# Patient Record
Sex: Male | Born: 1955 | Race: Black or African American | Hispanic: No | Marital: Married | Smoking: Former smoker
Health system: Southern US, Community
[De-identification: ages and names within clinical notes are randomized; demographics above are authoritative.]

## PROBLEM LIST (undated history)

## (undated) DIAGNOSIS — Z720 Tobacco use: Secondary | ICD-10-CM

## (undated) DIAGNOSIS — I2510 Atherosclerotic heart disease of native coronary artery without angina pectoris: Secondary | ICD-10-CM

## (undated) DIAGNOSIS — M169 Osteoarthritis of hip, unspecified: Secondary | ICD-10-CM

## (undated) DIAGNOSIS — I10 Essential (primary) hypertension: Secondary | ICD-10-CM

## (undated) HISTORY — PX: CARDIAC SURGERY: SHX584

## (undated) HISTORY — PX: OTHER SURGICAL HISTORY: SHX169

---

## 2000-09-15 DIAGNOSIS — M1610 Unilateral primary osteoarthritis, unspecified hip: Secondary | ICD-10-CM | POA: Insufficient documentation

## 2000-09-15 DIAGNOSIS — K219 Gastro-esophageal reflux disease without esophagitis: Secondary | ICD-10-CM | POA: Insufficient documentation

## 2008-12-06 DIAGNOSIS — I10 Essential (primary) hypertension: Secondary | ICD-10-CM | POA: Insufficient documentation

## 2012-10-02 DIAGNOSIS — Z96641 Presence of right artificial hip joint: Secondary | ICD-10-CM | POA: Insufficient documentation

## 2016-06-02 ENCOUNTER — Emergency Department
Admission: EM | Admit: 2016-06-02 | Discharge: 2016-06-02 | Disposition: A | Discharge status: Home / Self Care | Payer: 59 | Attending: Emergency Medicine | Admitting: Emergency Medicine

## 2016-06-02 ENCOUNTER — Emergency Department: Payer: 59

## 2016-06-02 ENCOUNTER — Encounter: Payer: Self-pay | Admitting: Medical Oncology

## 2016-06-02 DIAGNOSIS — W108XXA Fall (on) (from) other stairs and steps, initial encounter: Secondary | ICD-10-CM | POA: Diagnosis not present

## 2016-06-02 DIAGNOSIS — S52501A Unspecified fracture of the lower end of right radius, initial encounter for closed fracture: Secondary | ICD-10-CM | POA: Diagnosis not present

## 2016-06-02 DIAGNOSIS — I10 Essential (primary) hypertension: Secondary | ICD-10-CM | POA: Diagnosis not present

## 2016-06-02 DIAGNOSIS — Y929 Unspecified place or not applicable: Secondary | ICD-10-CM | POA: Insufficient documentation

## 2016-06-02 DIAGNOSIS — Y999 Unspecified external cause status: Secondary | ICD-10-CM | POA: Insufficient documentation

## 2016-06-02 DIAGNOSIS — Y9301 Activity, walking, marching and hiking: Secondary | ICD-10-CM | POA: Diagnosis not present

## 2016-06-02 DIAGNOSIS — S6991XA Unspecified injury of right wrist, hand and finger(s), initial encounter: Secondary | ICD-10-CM | POA: Diagnosis present

## 2016-06-02 HISTORY — DX: Essential (primary) hypertension: I10

## 2016-06-02 MED ORDER — HYDROCODONE-ACETAMINOPHEN 5-325 MG PO TABS
2.0000 | ORAL_TABLET | Freq: Once | ORAL | Status: AC
  Administered 2016-06-02: 2 via ORAL
  Filled 2016-06-02: qty 2

## 2016-06-02 MED ORDER — HYDROCODONE-ACETAMINOPHEN 5-325 MG PO TABS: 1.0000 | ORAL_TABLET | ORAL | 0 refills | Status: DC | PRN

## 2016-06-02 NOTE — ED Provider Notes (Signed)
[facility] Emergency Department Provider Note  ____________________________________________   First MD Initiated Contact with Patient 06/02/16 1012     (approximate)  I have reviewed the triage vital signs and the nursing notes.   HISTORY  Chief Complaint Wrist Pain    HPI Mark Mack is a 60 y.o. male is here complaining of right wrist pain. Patient states that he fell down some steps last evening while walking with a friend. He has had continued pain during the night with his wrist. He denies any previous wrist fractures. He denies any head injury or loss of consciousness. He has not taken any over-the-counter medication for pain last night or this morning. Patient is still able to move digits and has sensation. Currently he rates his pain as 7/10. Pain is increased with movement. Regarding his wrist minimally improves his pain.   Past Medical History:  Diagnosis Date  . Hypertension     There are no active problems to display for this patient.   No past surgical history on file.  Prior to Admission medications   Medication Sig Start Date End Date Taking? Authorizing Provider  HYDROcodone-acetaminophen (NORCO/VICODIN) 5-325 MG tablet Take 1-2 tablets by mouth every 4 (four) hours as needed for moderate pain. 06/02/16   Tommi Rumps, PA-C    Allergies Review of patient's allergies indicates no known allergies.  No family history on file.  Social History Social History  Substance Use Topics  . Smoking status: Not on file  . Smokeless tobacco: Not on file  . Alcohol use Not on file    Review of Systems Constitutional: No fever/chills Eyes: No visual changes. ENT: No trauma Cardiovascular: Denies chest pain. Respiratory: Denies shortness of breath. Gastrointestinal:  No nausea, no vomiting.  Musculoskeletal: Positive for right wrist pain. Skin: Negative for rash. Neurological: Negative for headaches, focal weakness or  numbness.  10-point ROS otherwise negative.  ____________________________________________   PHYSICAL EXAM:  VITAL SIGNS: ED Triage Vitals [06/02/16 0955]  Enc Vitals Group     BP (!) 179/90     Pulse Rate 77     Resp 16     Temp 98.2 F (36.8 C)     Temp Source Oral     SpO2 98 %     Weight 150 lb (68 kg)     Height  (1.676 m)     Head Circumference      Peak Flow      Pain Score 7     Pain Loc      Pain Edu?      Excl. in GC?     Constitutional: Alert and oriented. Well appearing and in no acute distress. Eyes: Conjunctivae are normal. PERRL. EOMI. Head: Atraumatic. Nose: No congestion/rhinnorhea. Neck: No stridor.   Cardiovascular: Normal rate, regular rhythm. Grossly normal heart sounds.  Good peripheral circulation. Respiratory: Normal respiratory effort.  No retractions. Lungs CTAB. Musculoskeletal: On examination of the right wrist there is some soft tissue swelling but no gross deformity. Range of motion is restricted secondary to patient's pain. There is moderate tenderness on palpation of the distal radius and ulna. Patient is able to move digits slowly. Motor sensory function intact. Neurologic:  Normal speech and language. No gross focal neurologic deficits are appreciated. No gait instability. Skin:  Skin is warm, dry and intact. No rash noted. No ecchymosis, abrasions, erythema was noted. Psychiatric: Mood and affect are normal. Speech and behavior are normal.  ____________________________________________   LABS (  all labs ordered are listed, but only abnormal results are displayed)  Labs Reviewed - No data to display   RADIOLOGY Per radiologist  IMPRESSION: 1. Acute, comminuted intra-articular fracture involves the distal radius.  I, Tommi Rumpshonda L Sharde Gover, personally viewed and evaluated these images (plain radiographs) as part of my medical decision making, as well as reviewing the written report by the  radiologist. ____________________________________________   PROCEDURES  Procedure(s) performed: None  Procedures  Critical Care performed: No  ____________________________________________   INITIAL IMPRESSION / ASSESSMENT AND PLAN / ED COURSE  Pertinent labs & imaging results that were available during my care of the patient were reviewed by me and considered in my medical decision making (see chart for details).    Clinical Course   Patient was placed in an OCL volar splint and given Norco 2 tablets in the emergency room for pain. Patient is given instructions to ice and elevate to reduce swelling. He is to leave the splint on until seen by the orthopedist. Patient was given information about Dr. Joice LoftsPoggi with phone number and told to make an appointment. He is also given a note to take to work stating that he would return when evaluated by the orthopedist. Patient is continue taking Norco every 4 hours as needed for pain.  ____________________________________________   FINAL CLINICAL IMPRESSION(S) / ED DIAGNOSES  Final diagnoses:  Distal radius fracture, right, closed, initial encounter      NEW MEDICATIONS STARTED DURING THIS VISIT:  Discharge Medication List as of 06/02/2016 11:45 AM    START taking these medications   Details  HYDROcodone-acetaminophen (NORCO/VICODIN) 5-325 MG tablet Take 1-2 tablets by mouth every 4 (four) hours as needed for moderate pain., Starting Sun 06/02/2016, Print         Note:  This document was prepared using Dragon voice recognition software and may include unintentional dictation errors.    Tommi Rumpshonda L Daryl Beehler, PA-C 06/02/16 1435    Phineas SemenGraydon Goodman, MD 06/02/16 [phone]

## 2016-06-02 NOTE — Discharge Instructions (Signed)
Ice and elevate as needed for swelling and decreasing pain. Call Dr. Joice LoftsPoggi office tomorrow for an appointment. Please splint on until seen by the orthopedist. Norco one or 2 tablets every 4 hours as needed for pain.

## 2016-06-02 NOTE — ED Triage Notes (Signed)
Pt reports he fell last night and injured his rt wrist,.

## 2016-06-02 NOTE — ED Notes (Signed)
States he fell last pm  Landed on right wrist  Min swelling noted  Positive pulses

## 2018-02-14 DIAGNOSIS — I25118 Atherosclerotic heart disease of native coronary artery with other forms of angina pectoris: Secondary | ICD-10-CM | POA: Insufficient documentation

## 2018-04-03 ENCOUNTER — Encounter: Payer: Self-pay | Admitting: Emergency Medicine

## 2018-04-03 ENCOUNTER — Observation Stay
Admission: EM | Admit: 2018-04-03 | Discharge: 2018-04-03 | Disposition: A | Discharge status: Home / Self Care | Payer: BLUE CROSS/BLUE SHIELD | Attending: Internal Medicine | Admitting: Internal Medicine

## 2018-04-03 ENCOUNTER — Other Ambulatory Visit: Payer: Self-pay

## 2018-04-03 ENCOUNTER — Emergency Department: Payer: BLUE CROSS/BLUE SHIELD

## 2018-04-03 DIAGNOSIS — Z951 Presence of aortocoronary bypass graft: Secondary | ICD-10-CM | POA: Insufficient documentation

## 2018-04-03 DIAGNOSIS — Z87891 Personal history of nicotine dependence: Secondary | ICD-10-CM | POA: Diagnosis not present

## 2018-04-03 DIAGNOSIS — R7989 Other specified abnormal findings of blood chemistry: Secondary | ICD-10-CM

## 2018-04-03 DIAGNOSIS — E860 Dehydration: Secondary | ICD-10-CM | POA: Insufficient documentation

## 2018-04-03 DIAGNOSIS — E871 Hypo-osmolality and hyponatremia: Secondary | ICD-10-CM | POA: Insufficient documentation

## 2018-04-03 DIAGNOSIS — I10 Essential (primary) hypertension: Secondary | ICD-10-CM | POA: Diagnosis not present

## 2018-04-03 DIAGNOSIS — Z79899 Other long term (current) drug therapy: Secondary | ICD-10-CM | POA: Diagnosis not present

## 2018-04-03 DIAGNOSIS — E8809 Other disorders of plasma-protein metabolism, not elsewhere classified: Secondary | ICD-10-CM | POA: Insufficient documentation

## 2018-04-03 DIAGNOSIS — I2510 Atherosclerotic heart disease of native coronary artery without angina pectoris: Secondary | ICD-10-CM | POA: Insufficient documentation

## 2018-04-03 DIAGNOSIS — D649 Anemia, unspecified: Secondary | ICD-10-CM | POA: Insufficient documentation

## 2018-04-03 DIAGNOSIS — Z7982 Long term (current) use of aspirin: Secondary | ICD-10-CM | POA: Insufficient documentation

## 2018-04-03 DIAGNOSIS — R748 Abnormal levels of other serum enzymes: Secondary | ICD-10-CM | POA: Diagnosis present

## 2018-04-03 DIAGNOSIS — R0789 Other chest pain: Secondary | ICD-10-CM | POA: Diagnosis not present

## 2018-04-03 DIAGNOSIS — R531 Weakness: Secondary | ICD-10-CM | POA: Diagnosis present

## 2018-04-03 DIAGNOSIS — R778 Other specified abnormalities of plasma proteins: Secondary | ICD-10-CM

## 2018-04-03 DIAGNOSIS — R079 Chest pain, unspecified: Secondary | ICD-10-CM | POA: Diagnosis present

## 2018-04-03 LAB — BASIC METABOLIC PANEL
Anion gap: 10 (ref 5–15)
BUN: 17 mg/dL (ref 6–20)
CALCIUM: 8.5 mg/dL — AB (ref 8.9–10.3)
CHLORIDE: 100 mmol/L — AB (ref 101–111)
CO2: 24 mmol/L (ref 22–32)
CREATININE: 1.05 mg/dL (ref 0.61–1.24)
GFR calc Af Amer: >60 mL/min (ref >60)
GFR calc non Af Amer: >60 mL/min (ref >60)
GLUCOSE: 107 mg/dL — AB (ref 65–99)
Potassium: 3.5 mmol/L (ref 3.5–5.1)
Sodium: 134 mmol/L — ABNORMAL LOW (ref 135–145)

## 2018-04-03 LAB — CBC
HCT: 23.6 % — ABNORMAL LOW (ref 40.0–52.0)
Hemoglobin: 7.7 g/dL — ABNORMAL LOW (ref 13.0–18.0)
MCH: 27.5 pg (ref 26.0–34.0)
MCHC: 32.6 g/dL (ref 32.0–36.0)
MCV: 84.3 fL (ref 80.0–100.0)
PLATELETS: 486 10*3/uL — AB (ref 150–440)
RBC: 2.8 MIL/uL — ABNORMAL LOW (ref 4.40–5.90)
RDW: 14.4 % (ref 11.5–14.5)
WBC: 15.6 10*3/uL — ABNORMAL HIGH (ref 3.8–10.6)

## 2018-04-03 LAB — ABO/RH: ABO/RH(D): O POS

## 2018-04-03 LAB — LIPASE, BLOOD: LIPASE: 23 U/L (ref 11–51)

## 2018-04-03 LAB — MAGNESIUM: Magnesium: 2.2 mg/dL (ref 1.7–2.4)

## 2018-04-03 LAB — TROPONIN I
TROPONIN I: 0.25 ng/mL — AB (ref <0.03)
TROPONIN I: 0.07 ng/mL — AB (ref <0.03)

## 2018-04-03 LAB — HEPATIC FUNCTION PANEL
ALBUMIN: 3.3 g/dL — AB (ref 3.5–5.0)
ALK PHOS: 118 U/L (ref 38–126)
ALT: 40 U/L (ref 17–63)
AST: 36 U/L (ref 15–41)
BILIRUBIN DIRECT: 0.2 mg/dL (ref 0.1–0.5)
BILIRUBIN TOTAL: 1.1 mg/dL (ref 0.3–1.2)
Indirect Bilirubin: 0.9 mg/dL (ref 0.3–0.9)
Total Protein: 6.5 g/dL (ref 6.5–8.1)

## 2018-04-03 LAB — CK: CK TOTAL: 208 U/L (ref 49–397)

## 2018-04-03 LAB — BRAIN NATRIURETIC PEPTIDE: B Natriuretic Peptide: 342 pg/mL — ABNORMAL HIGH (ref 0.0–100.0)

## 2018-04-03 LAB — TSH: TSH: 1.354 u[IU]/mL (ref 0.350–4.500)

## 2018-04-03 LAB — PREPARE RBC (CROSSMATCH)

## 2018-04-03 LAB — PREALBUMIN: Prealbumin: 10.4 mg/dL — ABNORMAL LOW (ref 18–38)

## 2018-04-03 LAB — PHOSPHORUS: Phosphorus: 3.7 mg/dL (ref 2.5–4.6)

## 2018-04-03 MED ORDER — HYDROCODONE-ACETAMINOPHEN 5-325 MG PO TABS: 1.0000 | ORAL_TABLET | ORAL | Status: DC | PRN

## 2018-04-03 MED ORDER — ROSUVASTATIN CALCIUM 10 MG PO TABS: 40.0000 mg | ORAL_TABLET | Freq: Every day | ORAL | Status: DC

## 2018-04-03 MED ORDER — BISACODYL 5 MG PO TBEC: 5.0000 mg | DELAYED_RELEASE_TABLET | Freq: Every day | ORAL | Status: DC | PRN

## 2018-04-03 MED ORDER — POTASSIUM CHLORIDE CRYS ER 20 MEQ PO TBCR: 40.0000 meq | EXTENDED_RELEASE_TABLET | Freq: Once | ORAL | Status: DC

## 2018-04-03 MED ORDER — ASPIRIN EC 81 MG PO TBEC
81.0000 mg | DELAYED_RELEASE_TABLET | Freq: Every day | ORAL | Status: DC
  Administered 2018-04-03: 81 mg via ORAL
  Filled 2018-04-03: qty 1

## 2018-04-03 MED ORDER — FERROUS SULFATE 325 (65 FE) MG PO TBEC: 325.0000 mg | DELAYED_RELEASE_TABLET | Freq: Every day | ORAL | 3 refills | Status: DC

## 2018-04-03 MED ORDER — ENOXAPARIN SODIUM 40 MG/0.4ML ~~LOC~~ SOLN: 40.0000 mg | SUBCUTANEOUS | Status: DC

## 2018-04-03 MED ORDER — METOPROLOL SUCCINATE ER 50 MG PO TB24
50.0000 mg | ORAL_TABLET | Freq: Every day | ORAL | Status: DC
  Administered 2018-04-03: 50 mg via ORAL
  Filled 2018-04-03: qty 1

## 2018-04-03 MED ORDER — SENNOSIDES-DOCUSATE SODIUM 8.6-50 MG PO TABS: 1.0000 | ORAL_TABLET | Freq: Every evening | ORAL | Status: DC | PRN

## 2018-04-03 MED ORDER — ONDANSETRON HCL 4 MG PO TABS: 4.0000 mg | ORAL_TABLET | Freq: Four times a day (QID) | ORAL | Status: DC | PRN

## 2018-04-03 MED ORDER — ACETAMINOPHEN 650 MG RE SUPP: 650.0000 mg | Freq: Four times a day (QID) | RECTAL | Status: DC | PRN

## 2018-04-03 MED ORDER — FUROSEMIDE 20 MG PO TABS
20.0000 mg | ORAL_TABLET | Freq: Every day | ORAL | Status: DC
  Administered 2018-04-03: 20 mg via ORAL
  Filled 2018-04-03: qty 1

## 2018-04-03 MED ORDER — ACETAMINOPHEN 325 MG PO TABS: 650.0000 mg | ORAL_TABLET | Freq: Four times a day (QID) | ORAL | Status: DC | PRN

## 2018-04-03 MED ORDER — ROSUVASTATIN CALCIUM 20 MG PO TABS
40.0000 mg | ORAL_TABLET | Freq: Every day | ORAL | 0 refills | Status: DC
Start: 2018-04-03 — End: 2018-04-03

## 2018-04-03 MED ORDER — SODIUM CHLORIDE 0.9 % IV SOLN
INTRAVENOUS | Status: AC
  Administered 2018-04-03: 10:00:00 via INTRAVENOUS

## 2018-04-03 MED ORDER — DOCUSATE SODIUM 100 MG PO CAPS: 100.0000 mg | ORAL_CAPSULE | Freq: Every day | ORAL | 0 refills | Status: DC

## 2018-04-03 MED ORDER — ROSUVASTATIN CALCIUM 20 MG PO TABS: 20.0000 mg | ORAL_TABLET | Freq: Every day | ORAL | 0 refills | Status: DC

## 2018-04-03 MED ORDER — ONDANSETRON HCL 4 MG/2ML IJ SOLN: 4.0000 mg | Freq: Four times a day (QID) | INTRAMUSCULAR | Status: DC | PRN

## 2018-04-03 MED ORDER — PANTOPRAZOLE SODIUM 40 MG PO TBEC: 40.0000 mg | DELAYED_RELEASE_TABLET | Freq: Every day | ORAL | Status: DC

## 2018-04-03 MED ORDER — IOPAMIDOL (ISOVUE-370) INJECTION 76%
125.0000 mL | Freq: Once | INTRAVENOUS | Status: AC | PRN
  Administered 2018-04-03: 125 mL via INTRAVENOUS

## 2018-04-03 MED ORDER — SODIUM CHLORIDE 0.9 % IV SOLN: 10.0000 mL/h | Freq: Once | INTRAVENOUS | Status: DC

## 2018-04-03 NOTE — Discharge Instructions (Signed)
Follow-up with primary care physician in 3 to 5 days Follow-up with cardiology Dr. Lennette BihariKohn on Monday at 2 PM Outpatient cardiac rehab in 3 to 5 days

## 2018-04-03 NOTE — ED Provider Notes (Signed)
[facility] Emergency Department Provider Note   ____________________________________________   First MD Initiated Contact with Patient 04/03/18 0315     (approximate)  I have reviewed the triage vital signs and the nursing notes.   HISTORY  Chief Complaint Medication Reaction    HPI Mark Mack is a 62 y.o. male brought to the ED from home via EMS with a chief complaint of possible medication reaction.  Patient had four-vessel bypass at [facility] on 03/25/2018.  Discharged home 3 days ago.  States every time he takes his atorvastatin, about 30 minutes later he feels generally weak and like "not enough blood is getting to my legs and body" and "does not feel right".  Reports he was taking atorvastatin at presumably the same dose prior to surgery and in the hospital.  However, for the last 3 days he has been feeling this way.  Patient and wife have isolated these feelings specifically after taking atorvastatin.  Presents this morning because patient felt some cramping to his legs and wanted evaluation of this medicine.  Since surgery, patient denies fever, chills, chest pain other than incisional pain, shortness of breath, abdominal pain, nausea, vomiting, dysuria, diarrhea.  States he is urinating and having bowel movements per usual.  States his appetite is picking back up.  He was discharged on a 10-day course of Lasix 20 mg orally.  Wife mentions patient had issues with anemia during his hospitalization and required blood transfusion.   Past Medical History:  Diagnosis Date  . Hypertension   CAD  Patient Active Problem List   Diagnosis Date Noted  . Chest pain 04/03/2018    Past Surgical History:  Procedure Laterality Date  . CARDIAC SURGERY      Prior to Admission medications   Medication Sig Start Date End Date Taking? Authorizing Provider  acetaminophen (TYLENOL) 500 MG tablet Take 1,000 mg by mouth every 6 (six) hours as needed.    Yes [provider]  aspirin EC 81 MG tablet Take 81 mg by mouth daily.    Yes [provider]  atorvastatin (LIPITOR) 80 MG tablet Take 80 mg by mouth daily at 6 PM.  03/08/18  Yes [provider]  esomeprazole (NEXIUM) 20 MG packet Take 20 mg by mouth daily before breakfast.  04/01/18 05/01/18 Yes [provider]  furosemide (LASIX) 20 MG tablet Take 20 mg by mouth daily. 03/29/18  Yes [provider]  metoprolol succinate (TOPROL XL) 50 MG 24 hr tablet Take 50 mg by mouth daily.  02/13/18 02/13/19 Yes [provider]  HYDROcodone-acetaminophen (NORCO/VICODIN) 5-325 MG tablet Take 1-2 tablets by mouth every 4 (four) hours as needed for moderate pain. Patient not taking: Reported on 04/03/2018 06/02/16   Tommi Rumps, PA-C    Allergies Patient has no known allergies.  History reviewed. No pertinent family history.  Social History Social History   Tobacco Use  . Smoking status: Not on file  Substance Use Topics  . Alcohol use: Not on file  . Drug use: Not on file    Review of Systems  Constitutional: Positive for generalized weakness and malaise.  No fever/chills Eyes: No visual changes. ENT: No sore throat. Cardiovascular: Denies chest pain. Respiratory: Denies shortness of breath. Gastrointestinal: No abdominal pain.  No nausea, no vomiting.  No diarrhea.  No constipation. Genitourinary: Negative for dysuria. Musculoskeletal: Negative for back pain. Skin: Negative for rash. Neurological: Negative for headaches, focal weakness or numbness.   ____________________________________________  PHYSICAL EXAM:  VITAL SIGNS: ED Triage Vitals [04/03/18 0258]  Enc Vitals Group     BP 120/66     Pulse Rate 71     Resp (!) 30     Temp 97.9 F (36.6 C)     Temp Source Oral     SpO2 100 %     Weight 144 lb (65.3 kg)     Height 5\' 5"  (1.651 m)     Head Circumference      Peak Flow      Pain Score 0     Pain Loc      Pain Edu?      Excl. in  GC?     Constitutional: Alert and oriented.  Tired appearing and in no acute distress. Eyes: Conjunctivae are normal. PERRL. EOMI. Head: Atraumatic. Nose: No congestion/rhinnorhea. Mouth/Throat: Mucous membranes are moist.  Oropharynx non-erythematous. Neck: No stridor.   Cardiovascular: Normal rate, regular rhythm. Grossly normal heart sounds.  Good peripheral circulation. Respiratory: Normal respiratory effort.  No retractions. Lungs with bibasilar rales.  Midline incision clean/dry/intact.  Sutures in place. Gastrointestinal: Soft and nontender. No distention. No abdominal bruits. No CVA tenderness. Musculoskeletal: No lower extremity tenderness. 1+ pitting BLE edema which patient states has improved from prior.  No joint effusions.  Right inner thigh and calf with old ecchymosis.  Bilateral femoral and distal pulses palpable.  Symmetrically warm limbs without evidence for ischemia.  Brisk, less than 5-second capillary refill bilaterally. Neurologic:  Normal speech and language. No gross focal neurologic deficits are appreciated. No gait instability. Skin:  Skin is warm, dry and intact. No rash noted. Psychiatric: Mood and affect are normal. Speech and behavior are normal.  ____________________________________________   LABS (all labs ordered are listed, but only abnormal results are displayed)  Labs Reviewed  BASIC METABOLIC PANEL - Abnormal; Notable for the following components:      Result Value   Sodium 134 (*)    Chloride 100 (*)    Glucose, Bld 107 (*)    Calcium 8.5 (*)    All other components within normal limits  CBC - Abnormal; Notable for the following components:   WBC 15.6 (*)    RBC 2.80 (*)    Hemoglobin 7.7 (*)    HCT 23.6 (*)    Platelets 486 (*)    All other components within normal limits  TROPONIN I - Abnormal; Notable for the following components:   Troponin I 0.25 (*)    All other components within normal limits  BRAIN NATRIURETIC PEPTIDE - Abnormal;  Notable for the following components:   B Natriuretic Peptide 342.0 (*)    All other components within normal limits  HEPATIC FUNCTION PANEL - Abnormal; Notable for the following components:   Albumin 3.3 (*)    All other components within normal limits  LIPASE, BLOOD  CK  URINALYSIS, COMPLETE (UACMP) WITH MICROSCOPIC  PREALBUMIN  CALCIUM, IONIZED  MAGNESIUM  PHOSPHORUS  TSH  URINALYSIS, ROUTINE W REFLEX MICROSCOPIC  TYPE AND SCREEN  PREPARE RBC (CROSSMATCH)  ABO/RH   ____________________________________________  EKG  ED ECG REPORT I, SUNG,JADE J, the attending physician, personally viewed and interpreted this ECG.   Date: 04/03/2018  EKG Time: 0255  Rate: 73  Rhythm: normal EKG, normal sinus rhythm  Axis: Normal  Intervals:none  ST&T Change: Nonspecific Unable to visualize EKG from [facility] dated 03/31/2018 but reading the report, today's EKG sounds similar ____________________________________________  RADIOLOGY  ED MD interpretation: Small left pleural effusion  and atelectasis on chest x-ray CT angiogram demonstrates no aortic dissection, patent three-vessel runoff, subcutaneous hematomas, soft tissue swelling; ultrasound demonstrates no DVT  Official radiology report(s): Ct Angio Ao+bifem W & Or Wo Contrast  Result Date: 04/03/2018 CLINICAL DATA:  Acute onset of recurrent lower extremity discomfort after taking atorvastatin. Bilateral lower extremity nonpitting edema. Right femoral swelling and pain. EXAM: CT ANGIOGRAPHY OF ABDOMINAL AORTA WITH ILIOFEMORAL RUNOFF TECHNIQUE: Multidetector CT imaging of the abdomen, pelvis and lower extremities was performed using the standard protocol during bolus administration of intravenous contrast. Multiplanar CT image reconstructions and MIPs were obtained to evaluate the vascular anatomy. CONTRAST:  125mL ISOVUE-370 IOPAMIDOL (ISOVUE-370) INJECTION 76% COMPARISON:  None. FINDINGS: VASCULAR Aorta: There is no evidence of aortic dissection.  There is no evidence of aneurysmal dilatation. Scattered calcification is seen along the abdominal aorta and its branches. Celiac: There is mild narrowing at the origin of the celiac trunk. Mild associated calcification is seen. SMA: The superior mesenteric artery remains patent. Renals: There is mild narrowing at the larger of the patient's proximal right renal arteries. The patient has 2 right-sided renal arteries. The left renal artery is grossly unremarkable. IMA: There is marked narrowing at the origin of the inferior mesenteric artery. RIGHT Lower Extremity Inflow: There is severe luminal narrowing at the origin of the right internal iliac artery, and additional scattered calcification and mural thrombus along the right internal iliac artery. The right common and external iliac arteries demonstrate scattered calcification but are otherwise unremarkable. The right common femoral artery demonstrate scattered calcification. Outflow: There is slight narrowing at the proximal superficial femoral artery. The profunda femoris artery and its branches appear grossly patent. Mild scattered calcification is seen at the distal superficial femoral artery and popliteal artery. Runoff: There is patent 3 vessel runoff to the level of the right ankle, though atherosclerotic disease is suggested along the anterior tibial artery. LEFT Lower Extremity Inflow: There is severe luminal narrowing at the origin of the left internal iliac artery, and additional scattered calcification along the proximal left internal iliac artery. The left common and external iliac arteries demonstrate scattered calcification but are otherwise unremarkable. The left common femoral artery demonstrates mild calcification. Outflow: There is slight luminal narrowing at the proximal left superficial femoral artery. The left profunda femoris artery and its branches are grossly unremarkable in appearance. Mild calcification is noted at the distal superficial  femoral artery and popliteal artery. Runoff: There is patent 3 vessel runoff to the level of the left ankle, though atherosclerotic disease is noted along the left anterior tibial artery. Veins: Visualized venous structures are grossly unremarkable. Review of the MIP images confirms the above findings. NON-VASCULAR Lower chest: A trace left pleural effusion is noted. Bibasilar airspace opacities likely reflect atelectasis. Trace pericardial fluid is noted. The patient is status post median sternotomy. Hepatobiliary: The liver is unremarkable in appearance. The gallbladder is unremarkable in appearance. The common bile duct remains normal in caliber. Pancreas: The pancreas is within normal limits. Spleen: The spleen is unremarkable in appearance. Adrenals/Urinary Tract: The adrenal glands are unremarkable in appearance. The kidneys are within normal limits. There is no evidence of hydronephrosis. No renal or ureteral stones are identified. No perinephric stranding is seen. Stomach/Bowel: The stomach is unremarkable in appearance. The small bowel is within normal limits. The appendix is not visualized; there is no evidence for appendicitis. The colon is unremarkable in appearance. Lymphatic: No retroperitoneal or pelvic sidewall lymphadenopathy is seen. Reproductive: The bladder is mildly distended and  grossly unremarkable. The prostate is normal in size. Other: A moderate to large right-sided hydrocele is noted, with a small right-sided scrotal pearl. Musculoskeletal: The patient's right hip arthroplasty is grossly unremarkable. There is severe degenerative change at the left hip joint, with prominent subcortical cysts and joint space loss. Sclerotic change is noted at L4. The visualized musculature is unremarkable in appearance. Diffuse calcification is seen along the abdominal aorta and its branches. The abdominal aorta is otherwise grossly unremarkable. The inferior vena cava is grossly unremarkable. No  retroperitoneal lymphadenopathy is seen. No pelvic sidewall lymphadenopathy is identified. Soft tissue edema is noted at the medial aspect of the right knee and along the medial right thigh, extending through the right inguinal region. Mildly prominent right inguinal nodes measure up to 1.2 cm in short axis. A 3.1 cm subcutaneous hematoma is noted medial to the right distal femur, while a 2.9 cm subcutaneous hematoma is noted along the medial aspect of the right thigh. Additional soft tissue edema is noted overlying the medial quadriceps musculature. IMPRESSION: VASCULAR 1. No evidence of aortic dissection. No evidence of aneurysmal dilatation. 2. Patent three-vessel runoff to the level of both ankles, though atherosclerotic disease is noted along the anterior tibial arteries bilaterally. 3. Scattered calcific atherosclerotic disease noted along the arterial vasculature to both lower extremities, as described above. 4. Mild narrowing at the origin of the celiac trunk and at the larger of the patient's right renal arteries. Severe luminal narrowing at the origin of the inferior mesenteric artery, and at the origins of the internal iliac arteries bilaterally. NON-VASCULAR 1. Soft tissue edema at the medial aspect of the right knee and along the medial right thigh, extending through the right inguinal region. Subcutaneous hematomas noted medial to the distal right femur and along the medial aspect of the right thigh, measuring 3.1 cm and 2.9 cm. Diffuse soft tissue edema overlying the medial quadriceps musculature. 2. Moderate to large right-sided hydrocele, with a small right-sided scrotal pearl. 3. Trace left pleural effusion. Bibasilar airspace opacities likely reflect atelectasis. 4. Severe degenerative change at the left hip joint, with prominent subcortical cysts and joint space loss. Aortic Atherosclerosis (ICD10-I70.0). Electronically Signed   By: Roanna Raider M.D.   On: 04/03/2018 05:31   US Venous Img  Lower Bilateral  Result Date: 04/03/2018 CLINICAL DATA:  Acute onset of bilateral leg swelling. EXAM: BILATERAL LOWER EXTREMITY VENOUS DOPPLER ULTRASOUND TECHNIQUE: Gray-scale sonography with graded compression, as well as color Doppler and duplex ultrasound were performed to evaluate the lower extremity deep venous systems from the level of the common femoral vein and including the common femoral, femoral, profunda femoral, popliteal and calf veins including the posterior tibial, peroneal and gastrocnemius veins when visible. The superficial great saphenous vein was also interrogated. Spectral Doppler was utilized to evaluate flow at rest and with distal augmentation maneuvers in the common femoral, femoral and popliteal veins. COMPARISON:  CTA runoff performed earlier today at 4:20 a.m. FINDINGS: RIGHT LOWER EXTREMITY Common Femoral Vein: No evidence of thrombus. Normal compressibility, respiratory phasicity and response to augmentation. Saphenofemoral Junction: No evidence of thrombus. Normal compressibility and flow on color Doppler imaging. Profunda Femoral Vein: No evidence of thrombus. Normal compressibility and flow on color Doppler imaging. Femoral Vein: No evidence of thrombus. Normal compressibility, respiratory phasicity and response to augmentation. Popliteal Vein: No evidence of thrombus. Normal compressibility, respiratory phasicity and response to augmentation. Calf Veins: No evidence of thrombus. Normal compressibility and flow on color Doppler imaging. Superficial Great Saphenous  Vein: No evidence of thrombus. Normal compressibility. Venous Reflux:  None. Other Findings: The subcutaneous soft tissue hematoma noted on CTA extends intermittently along a length of nearly 21 cm, from the right mid medial thigh to the right knee. The 2.4 x 1.2 cm hypoechoic structure at the right inguinal region may reflect a small hematoma or possibly a poorly characterized lymph node. Adjacent trace hemorrhage is  noted. LEFT LOWER EXTREMITY Common Femoral Vein: No evidence of thrombus. Normal compressibility, respiratory phasicity and response to augmentation. Saphenofemoral Junction: No evidence of thrombus. Normal compressibility and flow on color Doppler imaging. Profunda Femoral Vein: No evidence of thrombus. Normal compressibility and flow on color Doppler imaging. Femoral Vein: No evidence of thrombus. Normal compressibility, respiratory phasicity and response to augmentation. Popliteal Vein: No evidence of thrombus. Normal compressibility, respiratory phasicity and response to augmentation. Calf Veins: No evidence of thrombus. Normal compressibility and flow on color Doppler imaging. Superficial Great Saphenous Vein: No evidence of thrombus. Normal compressibility. Venous Reflux:  None. Other Findings:  None. IMPRESSION: 1. No evidence of deep venous thrombosis. 2. Subcutaneous soft tissue hematoma noted along the right mid medial thigh to right knee, extending intermittently along a length of nearly 21 cm. 3. Question of small hematoma or possibly a poorly characterized lymph node at the right inguinal region. Electronically Signed   By: Roanna Raider M.D.   On: 04/03/2018 06:47   Dg Chest Portable 1 View  Result Date: 04/03/2018 CLINICAL DATA:  Shortness of breath EXAM: PORTABLE CHEST 1 VIEW COMPARISON:  None. FINDINGS: Mild cardiomegaly with findings of remote median sternotomy. There is a small left pleural effusion with consolidation or atelectasis of the left lung base. No pulmonary edema. IMPRESSION: Small left pleural effusion and retrocardiac left lung base atelectasis or consolidation. Electronically Signed   By: Deatra Robinson M.D.   On: 04/03/2018 03:30    ____________________________________________   PROCEDURES  Procedure(s) performed: None  Procedures  Critical Care performed: Yes, see critical care note(s)   CRITICAL CARE Performed by: Irean Hong   Total critical care time: 45  minutes  Critical care time was exclusive of separately billable procedures and treating other patients.  Critical care was necessary to treat or prevent imminent or life-threatening deterioration.  Critical care was time spent personally by me on the following activities: development of treatment plan with patient and/or surrogate as well as nursing, discussions with consultants, evaluation of patient's response to treatment, examination of patient, obtaining history from patient or surrogate, ordering and performing treatments and interventions, ordering and review of laboratory studies, ordering and review of radiographic studies, pulse oximetry and re-evaluation of patient's condition.  ____________________________________________   INITIAL IMPRESSION / ASSESSMENT AND PLAN / ED COURSE  As part of my medical decision making, I reviewed the following data within the electronic MEDICAL RECORD NUMBER History obtained from family, Nursing notes reviewed and incorporated, Labs reviewed, EKG interpreted, Old chart reviewed, Radiograph reviewed and Notes from prior ED visits   62 year old male recently status post CABG who presents with generalized weakness and malaise after taking atorvastatin.  Differential diagnosis includes but is not limited to ACS, infectious, metabolic etiologies, etc.  In addition to cardiac panel, will add CK and BLE Doppler ultrasounds.  Patient currently resting and voices no complaints of pain.  Denies symptoms of generalized weakness, malaise or "not getting enough blood to my legs and body" like he felt at home prior to arrival.   Clinical Course as of Apr 03 732  Fri Apr 03, 2018  0340 Low H/H noted.  I personally reviewed patient's [facility] records.  On 6/3 H/H was 7.7/24.2; looks like patient's discharge was held for right thigh hematoma.  Thigh was wrapped, wife tells me patient did not receive a blood transfusion and patient was discharged on on 6/4 H/H was 8/24.8.  Given  that H/H has dropped slightly since discharge, will obtain CT angiogram to evaluate for active extravasation/hematoma.   [JS]  [id number] Patient currently in ultrasound.  Updated wife of CTA results.  Will order 1 unit PRBCs for transfusion.  Discussed with hospitalist to evaluate patient in the emergency department for admission.   [JS]    Clinical Course User Index [JS] Irean Hong, MD     ____________________________________________   FINAL CLINICAL IMPRESSION(S) / ED DIAGNOSES  Final diagnoses:  Generalized weakness  Symptomatic anemia  Elevated troponin     ED Discharge Orders    None       Note:  This document was prepared using Dragon voice recognition software and may include unintentional dictation errors.    Irean Hong, MD 04/03/18 [phone]

## 2018-04-03 NOTE — ED Notes (Addendum)
Floor notified that the pt would be coming up in about 5 minutes.  Report already called per night shift nurse Teacher, early years/preButch RN.  Blood consent signed and on the chart.

## 2018-04-03 NOTE — ED Notes (Signed)
Called to give report at this time; informed by unit clerk that RN was "on another line" and would call back when available to take report. This RN's ASCOM # given for callback.

## 2018-04-03 NOTE — ED Notes (Signed)
Surgery preformed by (Ikonomidis, Alonna BucklerJohn Sotirios, MD)

## 2018-04-03 NOTE — Consult Note (Signed)
Grove Defina is a 62 y.o. male  [id number]  Primary Cardiologist: Adrian Blackwater Reason for Consultation: chest pain and mildly elevated troponin status post CABG  HPI: this is a 63 year old African-American male with a past medical history of coronary artery disease status post CABG at [facility] on 03/31/2018 4 vessel presented to the hospital with shortness of breath and was severely anemic and received blood transfusion and hemoglobin is still 7 without any obvious bleed. Patient had mildly elevated troponin and atypical chest pain from prior surgery which was recently and is explainable to be musculoskeletal pain.   Review of Systems: patient does have shortness of breath but no orthopnea PND or leg swelling   Past Medical History:  Diagnosis Date  . Hypertension     Medications Prior to Admission  Medication Sig Dispense Refill  . acetaminophen (TYLENOL) 500 MG tablet Take 1,000 mg by mouth every 6 (six) hours as needed.     [name] aspirin EC 81 MG tablet Take 81 mg by mouth daily.     [name] atorvastatin (LIPITOR) 80 MG tablet Take 80 mg by mouth daily at 6 PM.   11  . esomeprazole (NEXIUM) 20 MG packet Take 20 mg by mouth daily before breakfast.     . furosemide (LASIX) 20 MG tablet Take 20 mg by mouth daily.  0  . metoprolol succinate (TOPROL XL) 50 MG 24 hr tablet Take 50 mg by mouth daily.     [name] HYDROcodone-acetaminophen (NORCO/VICODIN) 5-325 MG tablet Take 1-2 tablets by mouth every 4 (four) hours as needed for moderate pain. (Patient not taking: Reported on 04/03/2018) 30 tablet 0     . aspirin EC  81 mg Oral Daily  . enoxaparin (LOVENOX) injection  40 mg Subcutaneous Q24H  . furosemide  20 mg Oral Daily  . metoprolol succinate  50 mg Oral Daily  . pantoprazole  40 mg Oral QAC breakfast  . potassium chloride  40 mEq Oral Once  . rosuvastatin  40 mg Oral q1800    Infusions: . sodium chloride    . sodium chloride      No Known Allergies  Social History   Socioeconomic History   . Marital status: Married    Spouse name: Not on file  . Number of children: Not on file  . Years of education: Not on file  . Highest education level: Not on file  Occupational History  . Not on file  Social Needs  . Financial resource strain: Not on file  . Food insecurity:    Worry: Not on file    Inability: Not on file  . Transportation needs:    Medical: Not on file    Non-medical: Not on file  Tobacco Use  . Smoking status: Former Smoker    Last attempt to quit: 03/23/2018    Years since quitting: 0.0  . Smokeless tobacco: Never Used  Substance and Sexual Activity  . Alcohol use: Not on file    Comment: occassional   . Drug use: Not on file  . Sexual activity: Not on file  Lifestyle  . Physical activity:    Days per week: Not on file    Minutes per session: Not on file  . Stress: Not on file  Relationships  . Social connections:    Talks on phone: Not on file    Gets together: Not on file    Attends religious service: Not on file    Active member of club or  organization: Not on file    Attends meetings of clubs or organizations: Not on file    Relationship status: Not on file  . Intimate partner violence:    Fear of current or ex partner: Not on file    Emotionally abused: Not on file    Physically abused: Not on file    Forced sexual activity: Not on file  Other Topics Concern  . Not on file  Social History Narrative  . Not on file    History reviewed. No pertinent family history.  PHYSICAL EXAM: Vitals:   04/03/18 0730 04/03/18 0751  BP: 113/71 122/66  Pulse: 71 77  Resp: 18   Temp:  98.3 F (36.8 C)  SpO2: 100% 99%     Intake/Output Summary (Last 24 hours) at 04/03/2018 0905 Last data filed at 04/03/2018 0856 Gross per 24 hour  Intake -  Output 500 ml  Net -500 ml    General:  Well appearing. No respiratory difficulty HEENT: normal Neck: supple. no JVD. Carotids 2+ bilat; no bruits. No lymphadenopathy or thryomegaly appreciated. Cor: PMI  nondisplaced. Regular rate & rhythm. No rubs, gallops or murmurs. Lungs: clear Abdomen: soft, nontender, nondistended. No hepatosplenomegaly. No bruits or masses. Good bowel sounds. Extremities: no cyanosis, clubbing, rash, edema Neuro: alert & oriented x 3, cranial nerves grossly intact. moves all 4 extremities w/o difficulty. Affect pleasant.  ECG: normal sinus rhythm with nonspecific ST-T changes secondary to prior CABG  Results for orders placed or performed during the hospital encounter of 04/03/18 (from the past 24 hour(s))  Basic metabolic panel     Status: Abnormal   Collection Time: 04/03/18  3:01 AM  Result Value Ref Range   Sodium 134 (L) 135 - 145 mmol/L   Potassium 3.5 3.5 - 5.1 mmol/L   Chloride 100 (L) 101 - 111 mmol/L   CO2 24 22 - 32 mmol/L   Glucose, Bld 107 (H) 65 - 99 mg/dL   BUN 17 6 - 20 mg/dL   Creatinine, Ser 1.30 0.61 - 1.24 mg/dL   Calcium 8.5 (L) 8.9 - 10.3 mg/dL   GFR calc non Af Amer >60 >60 mL/min   GFR calc Af Amer >60 >60 mL/min   Anion gap 10 5 - 15  CBC     Status: Abnormal   Collection Time: 04/03/18  3:01 AM  Result Value Ref Range   WBC 15.6 (H) 3.8 - 10.6 K/uL   RBC 2.80 (L) 4.40 - 5.90 MIL/uL   Hemoglobin 7.7 (L) 13.0 - 18.0 g/dL   HCT 86.5 (L) 78.4 - 69.6 %   MCV 84.3 80.0 - 100.0 fL   MCH 27.5 26.0 - 34.0 pg   MCHC 32.6 32.0 - 36.0 g/dL   RDW 29.5 28.4 - 13.2 %   Platelets 486 (H) 150 - 440 K/uL  Troponin I     Status: Abnormal   Collection Time: 04/03/18  3:01 AM  Result Value Ref Range   Troponin I 0.25 (HH) <0.03 ng/mL  Hepatic function panel     Status: Abnormal   Collection Time: 04/03/18  3:01 AM  Result Value Ref Range   Total Protein 6.5 6.5 - 8.1 g/dL   Albumin 3.3 (L) 3.5 - 5.0 g/dL   AST 36 15 - 41 U/L   ALT 40 17 - 63 U/L   Alkaline Phosphatase 118 38 - 126 U/L   Total Bilirubin 1.1 0.3 - 1.2 mg/dL   Bilirubin, Direct 0.2 0.1 - 0.5 mg/dL  Indirect Bilirubin 0.9 0.3 - 0.9 mg/dL  Lipase, blood     Status: None    Collection Time: 04/03/18  3:01 AM  Result Value Ref Range   Lipase 23 11 - 51 U/L  CK     Status: None   Collection Time: 04/03/18  3:01 AM  Result Value Ref Range   Total CK 208 49 - 397 U/L  ABO/Rh     Status: None   Collection Time: 04/03/18  3:01 AM  Result Value Ref Range   ABO/RH(D)      O POS Performed at [facility], [address] [place], [place] [address]   Brain natriuretic peptide     Status: Abnormal   Collection Time: 04/03/18  3:02 AM  Result Value Ref Range   B Natriuretic Peptide 342.0 (H) 0.0 - 100.0 pg/mL  Type and screen [facility]     Status: None (Preliminary result)   Collection Time: 04/03/18  4:38 AM  Result Value Ref Range   ABO/RH(D) O POS    Antibody Screen NEG    Sample Expiration 04/06/2018    Unit Number [id number]    Blood Component Type RCLI PHER 2    Unit division 00    Status of Unit ALLOCATED    Transfusion Status OK TO TRANSFUSE    Crossmatch Result      Compatible Performed at [facility], [address] [place], [place] [address]   Prepare RBC     Status: None   Collection Time: 04/03/18  5:41 AM  Result Value Ref Range   Order Confirmation      ORDER PROCESSED BY BLOOD BANK Performed at [facility], [address] [place], [place] [address]    Ct Angio Ao+bifem W & Or Wo Contrast  Result Date: 04/03/2018 CLINICAL DATA:  Acute onset of recurrent lower extremity discomfort after taking atorvastatin. Bilateral lower extremity nonpitting edema. Right femoral swelling and pain. EXAM: CT ANGIOGRAPHY OF ABDOMINAL AORTA WITH ILIOFEMORAL RUNOFF TECHNIQUE: Multidetector CT imaging of the abdomen, pelvis and lower extremities was performed using the standard protocol during bolus administration of intravenous contrast. Multiplanar CT image reconstructions and MIPs were obtained to evaluate the vascular anatomy. CONTRAST:  125mL ISOVUE-370 IOPAMIDOL (ISOVUE-370) INJECTION 76%  COMPARISON:  None. FINDINGS: VASCULAR Aorta: There is no evidence of aortic dissection. There is no evidence of aneurysmal dilatation. Scattered calcification is seen along the abdominal aorta and its branches. Celiac: There is mild narrowing at the origin of the celiac trunk. Mild associated calcification is seen. SMA: The superior mesenteric artery remains patent. Renals: There is mild narrowing at the larger of the patient's proximal right renal arteries. The patient has 2 right-sided renal arteries. The left renal artery is grossly unremarkable. IMA: There is marked narrowing at the origin of the inferior mesenteric artery. RIGHT Lower Extremity Inflow: There is severe luminal narrowing at the origin of the right internal iliac artery, and additional scattered calcification and mural thrombus along the right internal iliac artery. The right common and external iliac arteries demonstrate scattered calcification but are otherwise unremarkable. The right common femoral artery demonstrate scattered calcification. Outflow: There is slight narrowing at the proximal superficial femoral artery. The profunda femoris artery and its branches appear grossly patent. Mild scattered calcification is seen at the distal superficial femoral artery and popliteal artery. Runoff: There is patent 3 vessel runoff to the level of the right ankle, though atherosclerotic disease is suggested along the anterior tibial  artery. LEFT Lower Extremity Inflow: There is severe luminal narrowing at the origin of the left internal iliac artery, and additional scattered calcification along the proximal left internal iliac artery. The left common and external iliac arteries demonstrate scattered calcification but are otherwise unremarkable. The left common femoral artery demonstrates mild calcification. Outflow: There is slight luminal narrowing at the proximal left superficial femoral artery. The left profunda femoris artery and its branches are  grossly unremarkable in appearance. Mild calcification is noted at the distal superficial femoral artery and popliteal artery. Runoff: There is patent 3 vessel runoff to the level of the left ankle, though atherosclerotic disease is noted along the left anterior tibial artery. Veins: Visualized venous structures are grossly unremarkable. Review of the MIP images confirms the above findings. NON-VASCULAR Lower chest: A trace left pleural effusion is noted. Bibasilar airspace opacities likely reflect atelectasis. Trace pericardial fluid is noted. The patient is status post median sternotomy. Hepatobiliary: The liver is unremarkable in appearance. The gallbladder is unremarkable in appearance. The common bile duct remains normal in caliber. Pancreas: The pancreas is within normal limits. Spleen: The spleen is unremarkable in appearance. Adrenals/Urinary Tract: The adrenal glands are unremarkable in appearance. The kidneys are within normal limits. There is no evidence of hydronephrosis. No renal or ureteral stones are identified. No perinephric stranding is seen. Stomach/Bowel: The stomach is unremarkable in appearance. The small bowel is within normal limits. The appendix is not visualized; there is no evidence for appendicitis. The colon is unremarkable in appearance. Lymphatic: No retroperitoneal or pelvic sidewall lymphadenopathy is seen. Reproductive: The bladder is mildly distended and grossly unremarkable. The prostate is normal in size. Other: A moderate to large right-sided hydrocele is noted, with a small right-sided scrotal pearl. Musculoskeletal: The patient's right hip arthroplasty is grossly unremarkable. There is severe degenerative change at the left hip joint, with prominent subcortical cysts and joint space loss. Sclerotic change is noted at L4. The visualized musculature is unremarkable in appearance. Diffuse calcification is seen along the abdominal aorta and its branches. The abdominal aorta is  otherwise grossly unremarkable. The inferior vena cava is grossly unremarkable. No retroperitoneal lymphadenopathy is seen. No pelvic sidewall lymphadenopathy is identified. Soft tissue edema is noted at the medial aspect of the right knee and along the medial right thigh, extending through the right inguinal region. Mildly prominent right inguinal nodes measure up to 1.2 cm in short axis. A 3.1 cm subcutaneous hematoma is noted medial to the right distal femur, while a 2.9 cm subcutaneous hematoma is noted along the medial aspect of the right thigh. Additional soft tissue edema is noted overlying the medial quadriceps musculature. IMPRESSION: VASCULAR 1. No evidence of aortic dissection. No evidence of aneurysmal dilatation. 2. Patent three-vessel runoff to the level of both ankles, though atherosclerotic disease is noted along the anterior tibial arteries bilaterally. 3. Scattered calcific atherosclerotic disease noted along the arterial vasculature to both lower extremities, as described above. 4. Mild narrowing at the origin of the celiac trunk and at the larger of the patient's right renal arteries. Severe luminal narrowing at the origin of the inferior mesenteric artery, and at the origins of the internal iliac arteries bilaterally. NON-VASCULAR 1. Soft tissue edema at the medial aspect of the right knee and along the medial right thigh, extending through the right inguinal region. Subcutaneous hematomas noted medial to the distal right femur and along the medial aspect of the right thigh, measuring 3.1 cm and 2.9 cm. Diffuse soft tissue edema overlying  the medial quadriceps musculature. 2. Moderate to large right-sided hydrocele, with a small right-sided scrotal pearl. 3. Trace left pleural effusion. Bibasilar airspace opacities likely reflect atelectasis. 4. Severe degenerative change at the left hip joint, with prominent subcortical cysts and joint space loss. Aortic Atherosclerosis (ICD10-I70.0).  Electronically Signed   By: Roanna Raider M.D.   On: 04/03/2018 05:31   US Venous Img Lower Bilateral  Result Date: 04/03/2018 CLINICAL DATA:  Acute onset of bilateral leg swelling. EXAM: BILATERAL LOWER EXTREMITY VENOUS DOPPLER ULTRASOUND TECHNIQUE: Gray-scale sonography with graded compression, as well as color Doppler and duplex ultrasound were performed to evaluate the lower extremity deep venous systems from the level of the common femoral vein and including the common femoral, femoral, profunda femoral, popliteal and calf veins including the posterior tibial, peroneal and gastrocnemius veins when visible. The superficial great saphenous vein was also interrogated. Spectral Doppler was utilized to evaluate flow at rest and with distal augmentation maneuvers in the common femoral, femoral and popliteal veins. COMPARISON:  CTA runoff performed earlier today at 4:20 a.m. FINDINGS: RIGHT LOWER EXTREMITY Common Femoral Vein: No evidence of thrombus. Normal compressibility, respiratory phasicity and response to augmentation. Saphenofemoral Junction: No evidence of thrombus. Normal compressibility and flow on color Doppler imaging. Profunda Femoral Vein: No evidence of thrombus. Normal compressibility and flow on color Doppler imaging. Femoral Vein: No evidence of thrombus. Normal compressibility, respiratory phasicity and response to augmentation. Popliteal Vein: No evidence of thrombus. Normal compressibility, respiratory phasicity and response to augmentation. Calf Veins: No evidence of thrombus. Normal compressibility and flow on color Doppler imaging. Superficial Great Saphenous Vein: No evidence of thrombus. Normal compressibility. Venous Reflux:  None. Other Findings: The subcutaneous soft tissue hematoma noted on CTA extends intermittently along a length of nearly 21 cm, from the right mid medial thigh to the right knee. The 2.4 x 1.2 cm hypoechoic structure at the right inguinal region may reflect a small  hematoma or possibly a poorly characterized lymph node. Adjacent trace hemorrhage is noted. LEFT LOWER EXTREMITY Common Femoral Vein: No evidence of thrombus. Normal compressibility, respiratory phasicity and response to augmentation. Saphenofemoral Junction: No evidence of thrombus. Normal compressibility and flow on color Doppler imaging. Profunda Femoral Vein: No evidence of thrombus. Normal compressibility and flow on color Doppler imaging. Femoral Vein: No evidence of thrombus. Normal compressibility, respiratory phasicity and response to augmentation. Popliteal Vein: No evidence of thrombus. Normal compressibility, respiratory phasicity and response to augmentation. Calf Veins: No evidence of thrombus. Normal compressibility and flow on color Doppler imaging. Superficial Great Saphenous Vein: No evidence of thrombus. Normal compressibility. Venous Reflux:  None. Other Findings:  None. IMPRESSION: 1. No evidence of deep venous thrombosis. 2. Subcutaneous soft tissue hematoma noted along the right mid medial thigh to right knee, extending intermittently along a length of nearly 21 cm. 3. Question of small hematoma or possibly a poorly characterized lymph node at the right inguinal region. Electronically Signed   By: Roanna Raider M.D.   On: 04/03/2018 06:47   Dg Chest Portable 1 View  Result Date: 04/03/2018 CLINICAL DATA:  Shortness of breath EXAM: PORTABLE CHEST 1 VIEW COMPARISON:  None. FINDINGS: Mild cardiomegaly with findings of remote median sternotomy. There is a small left pleural effusion with consolidation or atelectasis of the left lung base. No pulmonary edema. IMPRESSION: Small left pleural effusion and retrocardiac left lung base atelectasis or consolidation. Electronically Signed   By: Deatra Robinson M.D.   On: 04/03/2018 03:30  ASSESSMENT AND PLAN: shortness of breath and atypical chest pain with chest pain musculoskeletal due to recent CABG which was 4 vessel at [facility] on  03/31/2018.advise transfusing another one unit of blood and then can be discharged with follow-up in the office next Monday at 2 PM. Will do outpatient workup.  Paige Monarrez A

## 2018-04-03 NOTE — ED Triage Notes (Signed)
Patient from home via [facility]. Patient [facility] a week ago and discharged Tuesday. Per patient, since surgery, every time that he takes his atorvastatin, within 30 minutes he "doesn't feel right". States he feels like "not enough blood is getting to my legs and body". Per wife, patient was on the medication before surgery but only noticed symptoms after surgery. Patient denies CP or SOB. Non-pitting edema noted to bilateral legs which wife states has been present since surgery. Patient alert and oriented with NAD noted.

## 2018-04-03 NOTE — Discharge Summary (Addendum)
[facility] [place] [facility]   PATIENT NAME: Mark Mack    MR#:  [id number]  DATE OF BIRTH:  05-05-1956  DATE OF ADMISSION:  04/03/2018 ADMITTING PHYSICIAN: Barbaraann Rondo, MD  DATE OF DISCHARGE:  04/03/18 PRIMARY CARE PHYSICIAN: Elane Fritz, MD    ADMISSION DIAGNOSIS:  Elevated troponin [R74.8] Generalized weakness [R53.1] Symptomatic anemia [D64.9]  DISCHARGE DIAGNOSIS:  Atypical chest pain Symptomatic anemia status post blood transfusion   SECONDARY DIAGNOSIS:   Past Medical History:  Diagnosis Date  . Hypertension     HOSPITAL COURSE:   [facility] Pricilla Loveless  is a 62 y.o. male who presents with fall at home and subsequent pelvic fracture.  Once here in the ED she was also found to have significantly decreased renal function.  Patient has chronic kidney disease, but her renal function has declined significantly even from her most recent baseline.  Hospitalist were called for admission and further work-up   Hospital course  Symptomatic anemia, patient is status post coronary artery disease status post CABG 1 week ago Current hemoglobin at 7.7 we will transfuse 1 unit of blood transfusion   #Elevated troponin  S/p recent CAD/MI/CABG CABG scar is healing well Continue home medications, cardiac diet Atorvastatin changed to Crestor Echocardiogram normal ejection fraction based on recent echo, Seen by Dr. Park Breed okay to discharge patient as troponins are trending down  patient prefers seeing Dr. Welton Flakes on Monday at 2 PM as he has transportation issue, could not go too far TSH nml  Hyperlipidemia atorvastatin changed to Crestor-high intensity dose 20 mg per my d/w pharmacist  DISCHARGE CONDITIONS:   stable  CONSULTS OBTAINED:  Treatment Team:  Barbaraann Rondo, MD Laurier Nancy, MD   PROCEDURES 1 unit of blood transfusion  DRUG ALLERGIES:  No Known Allergies  DISCHARGE MEDICATIONS:   Allergies as of 04/03/2018   No  Known Allergies     Medication List    STOP taking these medications   atorvastatin 80 MG tablet Commonly known as:  LIPITOR   HYDROcodone-acetaminophen 5-325 MG tablet Commonly known as:  NORCO/VICODIN     TAKE these medications   acetaminophen 500 MG tablet Commonly known as:  TYLENOL Take 1,000 mg by mouth every 6 (six) hours as needed.   aspirin EC 81 MG tablet Take 81 mg by mouth daily.   docusate sodium 100 MG capsule Commonly known as:  COLACE Take 1 capsule (100 mg total) by mouth daily.   ferrous sulfate 325 (65 FE) MG EC tablet Take 1 tablet (325 mg total) by mouth daily with breakfast.   furosemide 20 MG tablet Commonly known as:  LASIX Take 20 mg by mouth daily.   NEXIUM 20 MG packet Generic drug:  esomeprazole Take 20 mg by mouth daily before breakfast.   rosuvastatin 20 MG tablet Commonly known as:  CRESTOR Take 1 tablet (20 mg total) by mouth at bedtime.   senna-docusate 8.6-50 MG tablet Commonly known as:  Senokot-S Take 1 tablet by mouth at bedtime as needed for mild constipation.   TOPROL XL 50 MG 24 hr tablet Generic drug:  metoprolol succinate Take 50 mg by mouth daily.        DISCHARGE INSTRUCTIONS:   Follow-up with primary care physician in 3 to 5 days Follow-up with cardiology Dr. Lennette Bihari on Monday at 2 PM Outpatient cardiac rehab in 3 to 5 days  DIET:  Cardiac diet  DISCHARGE CONDITION:  Stable  ACTIVITY:  Activity as tolerated  OXYGEN:  Home Oxygen: No.   Oxygen Delivery: room air  DISCHARGE LOCATION:  home   If you experience worsening of your admission symptoms, develop shortness of breath, life threatening emergency, suicidal or homicidal thoughts you must seek medical attention immediately by calling 911 or calling your MD immediately  if symptoms less severe.  You Must read complete instructions/literature along with all the possible adverse reactions/side effects for all the Medicines you take and that have been  prescribed to you. Take any new Medicines after you have completely understood and accpet all the possible adverse reactions/side effects.   Please note  You were cared for by a hospitalist during your hospital stay. If you have any questions about your discharge medications or the care you received while you were in the hospital after you are discharged, you can call the unit and asked to speak with the hospitalist on call if the hospitalist that took care of you is not available. Once you are discharged, your primary care physician will handle any further medical issues. Please note that NO REFILLS for any discharge medications will be authorized once you are discharged, as it is imperative that you return to your primary care physician (or establish a relationship with a primary care physician if you do not have one) for your aftercare needs so that they can reassess your need for medications and monitor your lab values.     Today  Chief Complaint  Patient presents with  . Medication Reaction   Patient denies any chest pain or shortness of breath and really want to go home after blood transfusion.  Feels much better and seen by cardiology.  Okay to discharge patient from cardiology standpoint  ROS:  CONSTITUTIONAL: Denies fevers, chills. Denies any fatigue, weakness.  EYES: Denies blurry vision, double vision, eye pain. EARS, NOSE, THROAT: Denies tinnitus, ear pain, hearing loss. RESPIRATORY: Denies cough, wheeze, shortness of breath.  CARDIOVASCULAR: Denies chest pain, palpitations, edema.  GASTROINTESTINAL: Denies nausea, vomiting, diarrhea, abdominal pain. Denies bright red blood per rectum. GENITOURINARY: Denies dysuria, hematuria. ENDOCRINE: Denies nocturia or thyroid problems. HEMATOLOGIC AND LYMPHATIC: Denies easy bruising or bleeding. SKIN: Denies rash or lesion. MUSCULOSKELETAL: Denies pain in neck, back, shoulder, knees, hips or arthritic symptoms.  NEUROLOGIC: Denies  paralysis, paresthesias.  PSYCHIATRIC: Denies anxiety or depressive symptoms.   VITAL SIGNS:  Blood pressure 105/76, pulse 71, temperature 98.7 F (37.1 C), temperature source Oral, resp. rate 15, height 5\' 5"  (1.651 m), weight 65.3 kg (144 lb), SpO2 100 %.  I/O:    Intake/Output Summary (Last 24 hours) at 04/03/2018 1420 Last data filed at 04/03/2018 1234 Gross per 24 hour  Intake 400 ml  Output 500 ml  Net -100 ml    PHYSICAL EXAMINATION:  GENERAL:  62 y.o.-year-old patient lying in the bed with no acute distress.  EYES: Pupils equal, round, reactive to light and accommodation. No scleral icterus. Extraocular muscles intact.  HEENT: Head atraumatic, normocephalic. Oropharynx and nasopharynx clear.  NECK:  Supple, no jugular venous distention. No thyroid enlargement, no tenderness.  LUNGS: Normal breath sounds bilaterally, no wheezing, rales,rhonchi or crepitation. No use of accessory muscles of respiration.  CARDIOVASCULAR: S1, S2 normal. No murmurs, rubs, or gallops.  Midsternal CABG scar is healing well ABDOMEN: Soft, non-tender, non-distended. Bowel sounds present. No organomegaly or mass.  EXTREMITIES: No pedal edema, cyanosis, or clubbing.  NEUROLOGIC: Cranial nerves II through XII are intact. Muscle strength at patient's baseline in all extremities. Sensation intact. Gait not checked.  PSYCHIATRIC: The patient is alert and oriented x 3.  SKIN: No obvious rash, lesion, or ulcer.   DATA REVIEW:   CBC Recent Labs  Lab 04/03/18 0301  WBC 15.6*  HGB 7.7*  HCT 23.6*  PLT 486*    Chemistries  Recent Labs  Lab 04/03/18 0301 04/03/18 0812  NA 134*  --   K 3.5  --   CL 100*  --   CO2 24  --   GLUCOSE 107*  --   BUN 17  --   CREATININE 1.05  --   CALCIUM 8.5*  --   MG  --  2.2  AST 36  --   ALT 40  --   ALKPHOS 118  --   BILITOT 1.1  --     Cardiac Enzymes Recent Labs  Lab 04/03/18 0812  TROPONINI 0.07*    Microbiology Results  No results found for  this or any previous visit.  RADIOLOGY:  Ct Angio Ao+bifem W & Or Wo Contrast  Result Date: 04/03/2018 CLINICAL DATA:  Acute onset of recurrent lower extremity discomfort after taking atorvastatin. Bilateral lower extremity nonpitting edema. Right femoral swelling and pain. EXAM: CT ANGIOGRAPHY OF ABDOMINAL AORTA WITH ILIOFEMORAL RUNOFF TECHNIQUE: Multidetector CT imaging of the abdomen, pelvis and lower extremities was performed using the standard protocol during bolus administration of intravenous contrast. Multiplanar CT image reconstructions and MIPs were obtained to evaluate the vascular anatomy. CONTRAST:  125mL ISOVUE-370 IOPAMIDOL (ISOVUE-370) INJECTION 76% COMPARISON:  None. FINDINGS: VASCULAR Aorta: There is no evidence of aortic dissection. There is no evidence of aneurysmal dilatation. Scattered calcification is seen along the abdominal aorta and its branches. Celiac: There is mild narrowing at the origin of the celiac trunk. Mild associated calcification is seen. SMA: The superior mesenteric artery remains patent. Renals: There is mild narrowing at the larger of the patient's proximal right renal arteries. The patient has 2 right-sided renal arteries. The left renal artery is grossly unremarkable. IMA: There is marked narrowing at the origin of the inferior mesenteric artery. RIGHT Lower Extremity Inflow: There is severe luminal narrowing at the origin of the right internal iliac artery, and additional scattered calcification and mural thrombus along the right internal iliac artery. The right common and external iliac arteries demonstrate scattered calcification but are otherwise unremarkable. The right common femoral artery demonstrate scattered calcification. Outflow: There is slight narrowing at the proximal superficial femoral artery. The profunda femoris artery and its branches appear grossly patent. Mild scattered calcification is seen at the distal superficial femoral artery and popliteal  artery. Runoff: There is patent 3 vessel runoff to the level of the right ankle, though atherosclerotic disease is suggested along the anterior tibial artery. LEFT Lower Extremity Inflow: There is severe luminal narrowing at the origin of the left internal iliac artery, and additional scattered calcification along the proximal left internal iliac artery. The left common and external iliac arteries demonstrate scattered calcification but are otherwise unremarkable. The left common femoral artery demonstrates mild calcification. Outflow: There is slight luminal narrowing at the proximal left superficial femoral artery. The left profunda femoris artery and its branches are grossly unremarkable in appearance. Mild calcification is noted at the distal superficial femoral artery and popliteal artery. Runoff: There is patent 3 vessel runoff to the level of the left ankle, though atherosclerotic disease is noted along the left anterior tibial artery. Veins: Visualized venous structures are grossly unremarkable. Review of the MIP images confirms the above findings. NON-VASCULAR Lower chest: A trace  left pleural effusion is noted. Bibasilar airspace opacities likely reflect atelectasis. Trace pericardial fluid is noted. The patient is status post median sternotomy. Hepatobiliary: The liver is unremarkable in appearance. The gallbladder is unremarkable in appearance. The common bile duct remains normal in caliber. Pancreas: The pancreas is within normal limits. Spleen: The spleen is unremarkable in appearance. Adrenals/Urinary Tract: The adrenal glands are unremarkable in appearance. The kidneys are within normal limits. There is no evidence of hydronephrosis. No renal or ureteral stones are identified. No perinephric stranding is seen. Stomach/Bowel: The stomach is unremarkable in appearance. The small bowel is within normal limits. The appendix is not visualized; there is no evidence for appendicitis. The colon is  unremarkable in appearance. Lymphatic: No retroperitoneal or pelvic sidewall lymphadenopathy is seen. Reproductive: The bladder is mildly distended and grossly unremarkable. The prostate is normal in size. Other: A moderate to large right-sided hydrocele is noted, with a small right-sided scrotal pearl. Musculoskeletal: The patient's right hip arthroplasty is grossly unremarkable. There is severe degenerative change at the left hip joint, with prominent subcortical cysts and joint space loss. Sclerotic change is noted at L4. The visualized musculature is unremarkable in appearance. Diffuse calcification is seen along the abdominal aorta and its branches. The abdominal aorta is otherwise grossly unremarkable. The inferior vena cava is grossly unremarkable. No retroperitoneal lymphadenopathy is seen. No pelvic sidewall lymphadenopathy is identified. Soft tissue edema is noted at the medial aspect of the right knee and along the medial right thigh, extending through the right inguinal region. Mildly prominent right inguinal nodes measure up to 1.2 cm in short axis. A 3.1 cm subcutaneous hematoma is noted medial to the right distal femur, while a 2.9 cm subcutaneous hematoma is noted along the medial aspect of the right thigh. Additional soft tissue edema is noted overlying the medial quadriceps musculature. IMPRESSION: VASCULAR 1. No evidence of aortic dissection. No evidence of aneurysmal dilatation. 2. Patent three-vessel runoff to the level of both ankles, though atherosclerotic disease is noted along the anterior tibial arteries bilaterally. 3. Scattered calcific atherosclerotic disease noted along the arterial vasculature to both lower extremities, as described above. 4. Mild narrowing at the origin of the celiac trunk and at the larger of the patient's right renal arteries. Severe luminal narrowing at the origin of the inferior mesenteric artery, and at the origins of the internal iliac arteries bilaterally.  NON-VASCULAR 1. Soft tissue edema at the medial aspect of the right knee and along the medial right thigh, extending through the right inguinal region. Subcutaneous hematomas noted medial to the distal right femur and along the medial aspect of the right thigh, measuring 3.1 cm and 2.9 cm. Diffuse soft tissue edema overlying the medial quadriceps musculature. 2. Moderate to large right-sided hydrocele, with a small right-sided scrotal pearl. 3. Trace left pleural effusion. Bibasilar airspace opacities likely reflect atelectasis. 4. Severe degenerative change at the left hip joint, with prominent subcortical cysts and joint space loss. Aortic Atherosclerosis (ICD10-I70.0). Electronically Signed   By: Roanna Raider M.D.   On: 04/03/2018 05:31   US Venous Img Lower Bilateral  Result Date: 04/03/2018 CLINICAL DATA:  Acute onset of bilateral leg swelling. EXAM: BILATERAL LOWER EXTREMITY VENOUS DOPPLER ULTRASOUND TECHNIQUE: Gray-scale sonography with graded compression, as well as color Doppler and duplex ultrasound were performed to evaluate the lower extremity deep venous systems from the level of the common femoral vein and including the common femoral, femoral, profunda femoral, popliteal and calf veins including the posterior tibial, peroneal  and gastrocnemius veins when visible. The superficial great saphenous vein was also interrogated. Spectral Doppler was utilized to evaluate flow at rest and with distal augmentation maneuvers in the common femoral, femoral and popliteal veins. COMPARISON:  CTA runoff performed earlier today at 4:20 a.m. FINDINGS: RIGHT LOWER EXTREMITY Common Femoral Vein: No evidence of thrombus. Normal compressibility, respiratory phasicity and response to augmentation. Saphenofemoral Junction: No evidence of thrombus. Normal compressibility and flow on color Doppler imaging. Profunda Femoral Vein: No evidence of thrombus. Normal compressibility and flow on color Doppler imaging. Femoral  Vein: No evidence of thrombus. Normal compressibility, respiratory phasicity and response to augmentation. Popliteal Vein: No evidence of thrombus. Normal compressibility, respiratory phasicity and response to augmentation. Calf Veins: No evidence of thrombus. Normal compressibility and flow on color Doppler imaging. Superficial Great Saphenous Vein: No evidence of thrombus. Normal compressibility. Venous Reflux:  None. Other Findings: The subcutaneous soft tissue hematoma noted on CTA extends intermittently along a length of nearly 21 cm, from the right mid medial thigh to the right knee. The 2.4 x 1.2 cm hypoechoic structure at the right inguinal region may reflect a small hematoma or possibly a poorly characterized lymph node. Adjacent trace hemorrhage is noted. LEFT LOWER EXTREMITY Common Femoral Vein: No evidence of thrombus. Normal compressibility, respiratory phasicity and response to augmentation. Saphenofemoral Junction: No evidence of thrombus. Normal compressibility and flow on color Doppler imaging. Profunda Femoral Vein: No evidence of thrombus. Normal compressibility and flow on color Doppler imaging. Femoral Vein: No evidence of thrombus. Normal compressibility, respiratory phasicity and response to augmentation. Popliteal Vein: No evidence of thrombus. Normal compressibility, respiratory phasicity and response to augmentation. Calf Veins: No evidence of thrombus. Normal compressibility and flow on color Doppler imaging. Superficial Great Saphenous Vein: No evidence of thrombus. Normal compressibility. Venous Reflux:  None. Other Findings:  None. IMPRESSION: 1. No evidence of deep venous thrombosis. 2. Subcutaneous soft tissue hematoma noted along the right mid medial thigh to right knee, extending intermittently along a length of nearly 21 cm. 3. Question of small hematoma or possibly a poorly characterized lymph node at the right inguinal region. Electronically Signed   By: Roanna Raider M.D.   On:  04/03/2018 06:47   Dg Chest Portable 1 View  Result Date: 04/03/2018 CLINICAL DATA:  Shortness of breath EXAM: PORTABLE CHEST 1 VIEW COMPARISON:  None. FINDINGS: Mild cardiomegaly with findings of remote median sternotomy. There is a small left pleural effusion with consolidation or atelectasis of the left lung base. No pulmonary edema. IMPRESSION: Small left pleural effusion and retrocardiac left lung base atelectasis or consolidation. Electronically Signed   By: Deatra Robinson M.D.   On: 04/03/2018 03:30    EKG:   Orders placed or performed during the hospital encounter of 04/03/18  . ED EKG within 10 minutes  . ED EKG within 10 minutes  . EKG 12-Lead  . EKG 12-Lead      Management plans discussed with the patient, family and they are in agreement.  CODE STATUS:     Code Status Orders  (From admission, onward)        Start     Ordered   04/03/18 0802  Full code  Continuous     04/03/18 0801    Code Status History    This patient has a current code status but no historical code status.      TOTAL TIME TAKING CARE OF THIS PATIENT: 41  minutes.   Note: This dictation was prepared with  Dragon dictation along with smaller Lobbyist. Any transcriptional errors that result from this process are unintentional.   @[facility] @  on 04/03/2018 at 2:20 PM  Between 7am to 6pm - Pager - [phone]  After 6pm go to [email or web address] - password [email or web address] [facility]  [place] [facility]  Office  [phone]  CC: Primary care physician; Elane Fritz, MD

## 2018-04-03 NOTE — ED Notes (Signed)
Dr Dolores FrameSung made aware at this time of pt's elevated Troponin level as reported by lab= Troponin 0.25ng/mL

## 2018-04-03 NOTE — H&P (Signed)
[employer] [facility] [place] [facility]   PATIENT NAME: Mark Mack    MR#:  [id number]  DATE OF BIRTH:  02-24-1956  DATE OF ADMISSION:  04/03/2018  PRIMARY CARE PHYSICIAN: Elane Fritz, MD   REQUESTING/REFERRING PHYSICIAN: Irean Hong, MD  CHIEF COMPLAINT:   Chief Complaint  Patient presents with  . Medication Reaction    HISTORY OF PRESENT ILLNESS:  Kamen Hanken  is a 62 y.o. male with a known history of CAD/MI s/p recent CABG @ [facility] (D/Ced Tues 03/31/2018) p/w generalized weakness. Hgb at discharge (06/04) 8.0. Endorses poor PO intake to food/fluids x1wk, dehydration. Endorses intermittent palpitations. Denies CP, SOB, HA/blurred vision, AP/N/V/D, LH/LOC. States his urine is dark and smells bad, but denies urinary symptoms (burning, dysuria, hematuria, nocturia, frequency, urgency, hesitancy, incontinence, etc.). He endorses leg swelling, but has had graft vessel harvested from his RLE. Hgb 7.7. No obvious bleeding reported. Ordered for 1u pRBC txfn in ED. Unrelated, pt states taking Atorvastatin makes him feel poorly (non-specific), (-) allergy. States he feels great otherwise.  PAST MEDICAL HISTORY:   Past Medical History:  Diagnosis Date  . Hypertension     PAST SURGICAL HISTORY:   Past Surgical History:  Procedure Laterality Date  . CARDIAC SURGERY      SOCIAL HISTORY:   Social History   Tobacco Use  . Smoking status: Not on file  Substance Use Topics  . Alcohol use: Not on file    FAMILY HISTORY:  History reviewed. No pertinent family history.  DRUG ALLERGIES:  No Known Allergies  REVIEW OF SYSTEMS:   Review of Systems  Constitutional: Positive for malaise/fatigue. Negative for chills, diaphoresis, fever and weight loss.  HENT: Negative for congestion, ear pain, hearing loss, nosebleeds, sinus pain, sore throat and tinnitus.   Eyes: Negative for blurred vision, double vision and photophobia.  Respiratory: Negative for cough,  hemoptysis, sputum production, shortness of breath and wheezing.   Cardiovascular: Positive for chest pain (+) chest wall pain/TTP at sternotomy site and leg swelling (+) RLE swelling (postop). Negative for palpitations, orthopnea, claudication and PND.  Gastrointestinal: Negative for abdominal pain, blood in stool, constipation, diarrhea, heartburn, melena, nausea and vomiting.  Genitourinary: Negative for dysuria, frequency, hematuria and urgency.  Musculoskeletal: Negative for back pain, falls, joint pain, myalgias and neck pain.  Skin: Negative for itching and rash.  Neurological: Positive for weakness. Negative for dizziness, tingling, tremors, sensory change, speech change, focal weakness, seizures, loss of consciousness and headaches.  Endo/Heme/Allergies: Does not bruise/bleed easily.  Psychiatric/Behavioral: Negative for memory loss. The patient does not have insomnia.     MEDICATIONS AT HOME:   Prior to Admission medications   Medication Sig Start Date End Date Taking? Authorizing Provider  acetaminophen (TYLENOL) 500 MG tablet Take 1,000 mg by mouth every 6 (six) hours as needed.    Yes [provider]  aspirin EC 81 MG tablet Take 81 mg by mouth daily.    Yes [provider]  atorvastatin (LIPITOR) 80 MG tablet Take 80 mg by mouth daily at 6 PM.  03/08/18  Yes [provider]  esomeprazole (NEXIUM) 20 MG packet Take 20 mg by mouth daily before breakfast.  04/01/18 05/01/18 Yes [provider]  furosemide (LASIX) 20 MG tablet Take 20 mg by mouth daily. 03/29/18  Yes [provider]  metoprolol succinate (TOPROL XL) 50 MG 24 hr tablet Take 50 mg by mouth daily.  02/13/18 02/13/19 Yes [provider]  HYDROcodone-acetaminophen (  NORCO/VICODIN) 5-325 MG tablet Take 1-2 tablets by mouth every 4 (four) hours as needed for moderate pain. Patient not taking: Reported on 04/03/2018 06/02/16   Tommi Rumps, PA-C      VITAL SIGNS:  Blood  pressure 116/67, pulse 72, temperature 97.9 F (36.6 C), temperature source Oral, resp. rate 19, height 5\' 5"  (1.651 m), weight 65.3 kg (144 lb), SpO2 100 %.  PHYSICAL EXAMINATION:  Physical Exam  Constitutional: He is oriented to person, place, and time. He appears well-developed and well-nourished. No distress.  HENT:  Head: Normocephalic and atraumatic.  Mouth/Throat: Oropharynx is clear and moist. No oropharyngeal exudate.  Eyes: Conjunctivae and EOM are normal. No scleral icterus.  Neck: Neck supple. No JVD present. No thyromegaly present.  Cardiovascular: Normal rate, regular rhythm and normal heart sounds. Exam reveals no gallop and no friction rub.  No murmur heard. Pulmonary/Chest: Effort normal and breath sounds normal. No stridor. No respiratory distress. He has no wheezes. He has no rales. He exhibits tenderness.  Abdominal: Soft. Bowel sounds are normal. He exhibits no distension. There is no tenderness. There is no rebound and no guarding.  Musculoskeletal: Normal range of motion. He exhibits edema and tenderness.  Lymphadenopathy:    He has no cervical adenopathy.  Neurological: He is alert and oriented to person, place, and time.  Skin: Skin is warm and dry. No rash noted. He is not diaphoretic. No erythema.  Psychiatric: He has a normal mood and affect. His behavior is normal. Judgment and thought content normal.   (+) sternotomy (clean), (+) RLE hematoma/swelling/TTP. LABORATORY PANEL:   CBC Recent Labs  Lab 04/03/18 0301  WBC 15.6*  HGB 7.7*  HCT 23.6*  PLT 486*   ------------------------------------------------------------------------------------------------------------------  Chemistries  Recent Labs  Lab 04/03/18 0301  NA 134*  K 3.5  CL 100*  CO2 24  GLUCOSE 107*  BUN 17  CREATININE 1.05  CALCIUM 8.5*  AST 36  ALT 40  ALKPHOS 118  BILITOT 1.1    ------------------------------------------------------------------------------------------------------------------  Cardiac Enzymes Recent Labs  Lab 04/03/18 0301  TROPONINI 0.25*   ------------------------------------------------------------------------------------------------------------------  RADIOLOGY:  Ct Angio Ao+bifem W & Or Wo Contrast  Result Date: 04/03/2018 CLINICAL DATA:  Acute onset of recurrent lower extremity discomfort after taking atorvastatin. Bilateral lower extremity nonpitting edema. Right femoral swelling and pain. EXAM: CT ANGIOGRAPHY OF ABDOMINAL AORTA WITH ILIOFEMORAL RUNOFF TECHNIQUE: Multidetector CT imaging of the abdomen, pelvis and lower extremities was performed using the standard protocol during bolus administration of intravenous contrast. Multiplanar CT image reconstructions and MIPs were obtained to evaluate the vascular anatomy. CONTRAST:  ISOVUE-370 IOPAMIDOL (ISOVUE-370) INJECTION 76% COMPARISON:  None. FINDINGS: VASCULAR Aorta: There is no evidence of aortic dissection. There is no evidence of aneurysmal dilatation. Scattered calcification is seen along the abdominal aorta and its branches. Celiac: There is mild narrowing at the origin of the celiac trunk. Mild associated calcification is seen. SMA: The superior mesenteric artery remains patent. Renals: There is mild narrowing at the larger of the patient's proximal right renal arteries. The patient has 2 right-sided renal arteries. The left renal artery is grossly unremarkable. IMA: There is marked narrowing at the origin of the inferior mesenteric artery. RIGHT Lower Extremity Inflow: There is severe luminal narrowing at the origin of the right internal iliac artery, and additional scattered calcification and mural thrombus along the right internal iliac artery. The right common and external iliac arteries demonstrate scattered calcification but are otherwise unremarkable. The right common femoral  artery  demonstrate scattered calcification. Outflow: There is slight narrowing at the proximal superficial femoral artery. The profunda femoris artery and its branches appear grossly patent. Mild scattered calcification is seen at the distal superficial femoral artery and popliteal artery. Runoff: There is patent 3 vessel runoff to the level of the right ankle, though atherosclerotic disease is suggested along the anterior tibial artery. LEFT Lower Extremity Inflow: There is severe luminal narrowing at the origin of the left internal iliac artery, and additional scattered calcification along the proximal left internal iliac artery. The left common and external iliac arteries demonstrate scattered calcification but are otherwise unremarkable. The left common femoral artery demonstrates mild calcification. Outflow: There is slight luminal narrowing at the proximal left superficial femoral artery. The left profunda femoris artery and its branches are grossly unremarkable in appearance. Mild calcification is noted at the distal superficial femoral artery and popliteal artery. Runoff: There is patent 3 vessel runoff to the level of the left ankle, though atherosclerotic disease is noted along the left anterior tibial artery. Veins: Visualized venous structures are grossly unremarkable. Review of the MIP images confirms the above findings. NON-VASCULAR Lower chest: A trace left pleural effusion is noted. Bibasilar airspace opacities likely reflect atelectasis. Trace pericardial fluid is noted. The patient is status post median sternotomy. Hepatobiliary: The liver is unremarkable in appearance. The gallbladder is unremarkable in appearance. The common bile duct remains normal in caliber. Pancreas: The pancreas is within normal limits. Spleen: The spleen is unremarkable in appearance. Adrenals/Urinary Tract: The adrenal glands are unremarkable in appearance. The kidneys are within normal limits. There is no evidence of  hydronephrosis. No renal or ureteral stones are identified. No perinephric stranding is seen. Stomach/Bowel: The stomach is unremarkable in appearance. The small bowel is within normal limits. The appendix is not visualized; there is no evidence for appendicitis. The colon is unremarkable in appearance. Lymphatic: No retroperitoneal or pelvic sidewall lymphadenopathy is seen. Reproductive: The bladder is mildly distended and grossly unremarkable. The prostate is normal in size. Other: A moderate to large right-sided hydrocele is noted, with a small right-sided scrotal pearl. Musculoskeletal: The patient's right hip arthroplasty is grossly unremarkable. There is severe degenerative change at the left hip joint, with prominent subcortical cysts and joint space loss. Sclerotic change is noted at L4. The visualized musculature is unremarkable in appearance. Diffuse calcification is seen along the abdominal aorta and its branches. The abdominal aorta is otherwise grossly unremarkable. The inferior vena cava is grossly unremarkable. No retroperitoneal lymphadenopathy is seen. No pelvic sidewall lymphadenopathy is identified. Soft tissue edema is noted at the medial aspect of the right knee and along the medial right thigh, extending through the right inguinal region. Mildly prominent right inguinal nodes measure up to 1.2 cm in short axis. A 3.1 cm subcutaneous hematoma is noted medial to the right distal femur, while a 2.9 cm subcutaneous hematoma is noted along the medial aspect of the right thigh. Additional soft tissue edema is noted overlying the medial quadriceps musculature. IMPRESSION: VASCULAR 1. No evidence of aortic dissection. No evidence of aneurysmal dilatation. 2. Patent three-vessel runoff to the level of both ankles, though atherosclerotic disease is noted along the anterior tibial arteries bilaterally. 3. Scattered calcific atherosclerotic disease noted along the arterial vasculature to both lower  extremities, as described above. 4. Mild narrowing at the origin of the celiac trunk and at the larger of the patient's right renal arteries. Severe luminal narrowing at the origin of the inferior mesenteric artery, and at the  origins of the internal iliac arteries bilaterally. NON-VASCULAR 1. Soft tissue edema at the medial aspect of the right knee and along the medial right thigh, extending through the right inguinal region. Subcutaneous hematomas noted medial to the distal right femur and along the medial aspect of the right thigh, measuring 3.1 cm and 2.9 cm. Diffuse soft tissue edema overlying the medial quadriceps musculature. 2. Moderate to large right-sided hydrocele, with a small right-sided scrotal pearl. 3. Trace left pleural effusion. Bibasilar airspace opacities likely reflect atelectasis. 4. Severe degenerative change at the left hip joint, with prominent subcortical cysts and joint space loss. Aortic Atherosclerosis (ICD10-I70.0). Electronically Signed   By: Roanna RaiderJeffery  Chang M.D.   On: 04/03/2018 05:31   Dg Chest Portable 1 View  Result Date: 04/03/2018 CLINICAL DATA:  Shortness of breath EXAM: PORTABLE CHEST 1 VIEW COMPARISON:  None. FINDINGS: Mild cardiomegaly with findings of remote median sternotomy. There is a small left pleural effusion with consolidation or atelectasis of the left lung base. No pulmonary edema. IMPRESSION: Small left pleural effusion and retrocardiac left lung base atelectasis or consolidation. Electronically Signed   By: Deatra RobinsonKevin  Herman M.D.   On: 04/03/2018 03:30   IMPRESSION AND PLAN:   A/P: 38M symptomatic (normocytic) anemia, generalized weakness, mild hyponatremia, hyperglycemia, hypocalcemia, hypoalbuminemia, Troponin elevation, RLE swelling. -No acute bleeding seen on Aortobifemoral CTA -B/L LE venous U/S completed in ED, results pending -1u pRBC txfn ordered in ED, goal Hgb > 8.0 in setting of recent CAD/MI/CABG -U/A -TSH -Phosphorus level -Magnesium  level -Ionized calcium level -Prealbumin -Trop-I elevated in setting of recent surgery. Denies cardiac symptomatology. Cannot compare Trop-I values w/ [facility] discharge data, non-equivalent testing. Not trending Trop-I. Tele, continuous cardiac monitoring -Dehydrated, IVF. EF normal based on most recent Echo -Says Atorvastatin makes him feel poorly, changed to Crestor -c/w other home meds -Cardiac diet -Lovenox -Full code -Observation, < 2 midnights   All the records are reviewed and case discussed with ED provider. Management plans discussed with the patient, family and they are in agreement.  CODE STATUS: Full code  TOTAL TIME TAKING CARE OF THIS PATIENT: 90 minutes.    Barbaraann RondoPrasanna Kaliyah Gladman M.D on 04/03/2018 at 6:35 AM  Between 7am to 6pm - Pager - [phone]  After 6pm go to [email or web address] - Social research officer, governmentpassword [email or web address] [facility]  [facility]  Office  [phone]  CC: Primary care physician; Elane FritzMetzger, Gregory S, MD   Note: This dictation was prepared with Dragon dictation along with smaller phrase technology. Any transcriptional errors that result from this process are unintentional.

## 2018-04-03 NOTE — Progress Notes (Signed)
IVs and tele removed from patient. Discharge instructions given to patient along with hard cop prescription. Verbalized understanding. No acute distress at this time. Wife at bedside and will transport patient home.

## 2018-04-04 LAB — BPAM RBC
Blood Product Expiration Date: 201906122359
ISSUE DATE / TIME: 201906070939
UNIT TYPE AND RH: 9500

## 2018-04-04 LAB — TYPE AND SCREEN
ABO/RH(D): O POS
Antibody Screen: NEGATIVE
UNIT DIVISION: 0

## 2018-04-04 LAB — CALCIUM, IONIZED: CALCIUM, IONIZED, SERUM: 5 mg/dL (ref 4.5–5.6)

## 2018-04-04 LAB — HIV ANTIBODY (ROUTINE TESTING W REFLEX): HIV SCREEN 4TH GENERATION: NONREACTIVE

## 2018-05-28 ENCOUNTER — Encounter: Payer: BLUE CROSS/BLUE SHIELD | Attending: Surgery | Admitting: *Deleted

## 2018-05-28 ENCOUNTER — Encounter: Payer: Self-pay | Admitting: *Deleted

## 2018-05-28 VITALS — Ht 66.0 in | Wt 144.0 lb

## 2018-05-28 DIAGNOSIS — Z951 Presence of aortocoronary bypass graft: Secondary | ICD-10-CM | POA: Insufficient documentation

## 2018-05-28 NOTE — Progress Notes (Signed)
Daily Session Note  Patient Details  Name: Mark Mack MRN: [id number] Date of Birth: May 27, 1956 Referring Provider:     Cardiac Rehab from 05/28/2018 in [facility]  Referring Provider  Ikonimidis      Encounter Date: 05/28/2018  Check In: Session Check In - 05/28/18 1334      Check-In   Supervising physician immediately available to respond to emergencies  See telemetry face sheet for immediately available ER MD    Location  [facility]-Cardiac & Pulmonary Rehab    Staff Present  Renita Papa, RN Vickki Hearing, BA, ACSM CEP, Exercise Physiologist    Tobacco Cessation  No Change Quit 03/22/18    Warm-up and Cool-down  Not performed (comment) Med Review    Resistance Training Performed  Yes    VAD Patient?  No    PAD/SET Patient?  No      Pain Assessment   Currently in Pain?  No/denies        Exercise Prescription Changes - 05/28/18 1400      Response to Exercise   Blood Pressure (Admit)  122/64    Blood Pressure (Exercise)  160/70    Blood Pressure (Exit)  120/70    Heart Rate (Admit)  62 bpm    Heart Rate (Exercise)  130 bpm    Heart Rate (Exit)  60 bpm    Oxygen Saturation (Admit)  98 %    Oxygen Saturation (Exercise)  99 %    Rating of Perceived Exertion (Exercise)  11       Social History   Tobacco Use  Smoking Status Former Smoker  . Last attempt to quit: 03/23/2018  . Years since quitting: 0.1  Smokeless Tobacco Never Used    Goals Met:  Proper associated with RPD/PD & O2 Sat Exercise tolerated well No report of cardiac concerns or symptoms Strength training completed today  Goals Unmet:  Not Applicable  Comments: Med Review completed   Dr. Emily Filbert is Medical Director for [facility] and [facility].

## 2018-05-28 NOTE — Progress Notes (Signed)
Cardiac Individual Treatment Plan  Patient Details  Name: Mark Mack MRN: [id number] Date of Birth: 09-12-1956 Referring Provider:     Cardiac Rehab from 05/28/2018 in [facility] Cardiac and Pulmonary Rehab  Referring Provider  Ikonimidis      Initial Encounter Date:    Cardiac Rehab from 05/28/2018 in [facility] Cardiac and Pulmonary Rehab  Date  05/28/18      Visit Diagnosis: S/P CABG x 4  Patient's Home Medications on Admission:  Current Outpatient Medications:  .  acetaminophen (TYLENOL) 500 MG tablet, Take 1,000 mg by mouth every 6 (six) hours as needed. , Disp: , Rfl:  .  aspirin EC 81 MG tablet, Take 81 mg by mouth daily. , Disp: , Rfl:  .  ezetimibe (ZETIA) 10 MG tablet, Take 10 mg by mouth daily., Disp: , Rfl: 2 .  metoprolol succinate (TOPROL XL) 50 MG 24 hr tablet, Take 50 mg by mouth daily. , Disp: , Rfl:  .  ranitidine (ZANTAC) 150 MG tablet, , Disp: , Rfl:  .  docusate sodium (COLACE) 100 MG capsule, Take 1 capsule (100 mg total) by mouth daily. (Patient not taking: Reported on 05/28/2018), Disp: 10 capsule, Rfl: 0 .  esomeprazole (NEXIUM) 20 MG packet, Take 20 mg by mouth daily before breakfast. , Disp: , Rfl:  .  ferrous sulfate 325 (65 FE) MG EC tablet, Take 1 tablet (325 mg total) by mouth daily with breakfast. (Patient not taking: Reported on 05/28/2018), Disp: 60 tablet, Rfl: 3 .  furosemide (LASIX) 20 MG tablet, Take 20 mg by mouth daily., Disp: , Rfl: 0 .  rosuvastatin (CRESTOR) 20 MG tablet, Take 1 tablet (20 mg total) by mouth at bedtime. (Patient not taking: Reported on 05/28/2018), Disp: 30 tablet, Rfl: 0 .  senna-docusate (SENOKOT-S) 8.6-50 MG tablet, Take 1 tablet by mouth at bedtime as needed for mild constipation. (Patient not taking: Reported on 05/28/2018), Disp: , Rfl:   Past Medical History: Past Medical History:  Diagnosis Date  . Hypertension     Tobacco Use: Social History   Tobacco Use  Smoking Status Former Smoker  . Last attempt to quit: 03/23/2018  . Years  since quitting: 0.1  Smokeless Tobacco Never Used    Labs: Recent Review Flowsheet Data    There is no flowsheet data to display.       Exercise Target Goals: Date: 05/28/18  Exercise Program Goal: Individual exercise prescription set using results from initial 6 min walk test and THRR while considering  patient's activity barriers and safety.   Exercise Prescription Goal: Initial exercise prescription builds to 30-45 minutes a day of aerobic activity, 2-3 days per week.  Home exercise guidelines will be given to patient during program as part of exercise prescription that the participant will acknowledge.  Activity Barriers & Risk Stratification: Activity Barriers & Cardiac Risk Stratification - 05/28/18 1413      Activity Barriers & Cardiac Risk Stratification   Activity Barriers  Arthritis;Right Hip Replacement;Joint Problems Needs Left Hip Replacement    Cardiac Risk Stratification  High       6 Minute Walk: 6 Minute Walk    Row Name 05/28/18 1419         6 Minute Walk   Distance  1414 feet     Walk Time  6 minutes     # of Rest Breaks  0     MPH  2.68     METS  4.43     RPE  11  Perceived Dyspnea   0     VO2 Peak  15.5     Symptoms  No     Resting HR  67 bpm     Resting BP  122/64     Resting Oxygen Saturation   98 %     Exercise Oxygen Saturation  during 6 min walk  99 %     Max Ex. HR  130 bpm     Max Ex. BP  160/70     2 Minute Post BP  120/70        Oxygen Initial Assessment:   Oxygen Re-Evaluation:   Oxygen Discharge (Final Oxygen Re-Evaluation):   Initial Exercise Prescription: Initial Exercise Prescription - 05/28/18 1400      Date of Initial Exercise RX and Referring Provider   Date  05/28/18    Referring Provider  Ikonimidis      Treadmill   MPH  2.7    Grade  3    Minutes  15    METs  4.2      Recumbant Bike   Level  6    RPM  60    Watts  50    Minutes  15    METs  4.4      REL-XR   Level  4    Speed  50     Minutes  15    METs  4.4      Prescription Details   Frequency (times per week)  3    Duration  Progress to 45 minutes of aerobic exercise without signs/symptoms of physical distress      Intensity   THRR 40-80% of Max Heartrate  104-141    Ratings of Perceived Exertion  11-13    Perceived Dyspnea  0-4      Resistance Training   Training Prescription  Yes    Weight  3 lb    Reps  10-15       Perform Capillary Blood Glucose checks as needed.  Exercise Prescription Changes: Exercise Prescription Changes    Row Name 05/28/18 1400             Response to Exercise   Blood Pressure (Admit)  122/64       Blood Pressure (Exercise)  160/70       Blood Pressure (Exit)  120/70       Heart Rate (Admit)  62 bpm       Heart Rate (Exercise)  130 bpm       Heart Rate (Exit)  60 bpm       Oxygen Saturation (Admit)  98 %       Oxygen Saturation (Exercise)  99 %       Rating of Perceived Exertion (Exercise)  11          Exercise Comments:   Exercise Goals and Review: Exercise Goals    Row Name 05/28/18 1419             Exercise Goals   Increase Physical Activity  Yes       Intervention  Provide advice, education, support and counseling about physical activity/exercise needs.;Develop an individualized exercise prescription for aerobic and resistive training based on initial evaluation findings, risk stratification, comorbidities and participant's personal goals.       Expected Outcomes  Short Term: Attend rehab on a regular basis to increase amount of physical activity.;Long Term: Add in home exercise to make exercise part of routine and to increase  amount of physical activity.;Long Term: Exercising regularly at least 3-5 days a week.       Increase Strength and Stamina  Yes       Intervention  Provide advice, education, support and counseling about physical activity/exercise needs.;Develop an individualized exercise prescription for aerobic and resistive training based on  initial evaluation findings, risk stratification, comorbidities and participant's personal goals.       Expected Outcomes  Short Term: Increase workloads from initial exercise prescription for resistance, speed, and METs.;Short Term: Perform resistance training exercises routinely during rehab and add in resistance training at home;Long Term: Improve cardiorespiratory fitness, muscular endurance and strength as measured by increased METs and functional capacity (6MWT)       Able to understand and use rate of perceived exertion (RPE) scale  Yes       Intervention  Provide education and explanation on how to use RPE scale       Expected Outcomes  Short Term: Able to use RPE daily in rehab to express subjective intensity level;Long Term:  Able to use RPE to guide intensity level when exercising independently       Able to understand and use Dyspnea scale  Yes       Intervention  Provide education and explanation on how to use Dyspnea scale       Expected Outcomes  Short Term: Able to use Dyspnea scale daily in rehab to express subjective sense of shortness of breath during exertion;Long Term: Able to use Dyspnea scale to guide intensity level when exercising independently       Knowledge and understanding of Target Heart Rate Range (THRR)  Yes       Intervention  Provide education and explanation of THRR including how the numbers were predicted and where they are located for reference       Expected Outcomes  Short Term: Able to state/look up THRR;Short Term: Able to use daily as guideline for intensity in rehab;Long Term: Able to use THRR to govern intensity when exercising independently       Able to check pulse independently  Yes       Intervention  Provide education and demonstration on how to check pulse in carotid and radial arteries.;Review the importance of being able to check your own pulse for safety during independent exercise       Expected Outcomes  Short Term: Able to explain why pulse  checking is important during independent exercise;Long Term: Able to check pulse independently and accurately       Understanding of Exercise Prescription  Yes       Intervention  Provide education, explanation, and written materials on patient's individual exercise prescription       Expected Outcomes  Short Term: Able to explain program exercise prescription;Long Term: Able to explain home exercise prescription to exercise independently          Exercise Goals Re-Evaluation :   Discharge Exercise Prescription (Final Exercise Prescription Changes): Exercise Prescription Changes - 05/28/18 1400      Response to Exercise   Blood Pressure (Admit)  122/64    Blood Pressure (Exercise)  160/70    Blood Pressure (Exit)  120/70    Heart Rate (Admit)  62 bpm    Heart Rate (Exercise)  130 bpm    Heart Rate (Exit)  60 bpm    Oxygen Saturation (Admit)  98 %    Oxygen Saturation (Exercise)  99 %    Rating of Perceived Exertion (Exercise)  11       Nutrition:  Target Goals: Understanding of nutrition guidelines, daily intake of sodium <1544m, cholesterol <2083m calories 30% from fat and 7% or less from saturated fats, daily to have 5 or more servings of fruits and vegetables.  Biometrics: Pre Biometrics - 05/28/18 1417      Pre Biometrics   Height  _0  (1.676 m)    Weight  144 lb (65.3 kg)    Waist Circumference  30.5 inches    Hip Circumference  37.5 inches    Waist to Hip Ratio  0.81 %    BMI (Calculated)  23.25    Single Leg Stand  14.5 seconds        Nutrition Therapy Plan and Nutrition Goals: Nutrition Therapy & Goals - 05/28/18 1359      Intervention Plan   Intervention  Prescribe, educate and counsel regarding individualized specific dietary modifications aiming towards targeted core components such as weight, hypertension, lipid management, diabetes, heart failure and other comorbidities.;Nutrition handout(s) given to patient.    Expected Outcomes  Short Term Goal:  Understand basic principles of dietary content, such as calories, fat, sodium, cholesterol and nutrients.;Short Term Goal: A plan has been developed with personal nutrition goals set during dietitian appointment.;Long Term Goal: Adherence to prescribed nutrition plan.       Nutrition Assessments: Nutrition Assessments - 05/28/18 1359      MEDFICTS Scores   Pre Score  45       Nutrition Goals Re-Evaluation:   Nutrition Goals Discharge (Final Nutrition Goals Re-Evaluation):   Psychosocial: Target Goals: Acknowledge presence or absence of significant depression and/or stress, maximize coping skills, provide positive support system. Participant is able to verbalize types and ability to use techniques and skills needed for reducing stress and depression.   Initial Review & Psychosocial Screening: Initial Psych Review & Screening - 05/28/18 1359      Initial Review   Current issues with  Current Stress Concerns    Comments  Returning to work the end of August. Works painting car parts in the heat, long hours. He knows he needs to take it easy returning to work. He is ready to get back to his normal routine.       Family Dynamics   Good Support System?  Yes Wife, family      Barriers   Psychosocial barriers to participate in program  There are no identifiable barriers or psychosocial needs.;The patient should benefit from training in stress management and relaxation.      Screening Interventions   Interventions  Program counselor consult;To provide support and resources with identified psychosocial needs;Encouraged to exercise;Provide feedback about the scores to participant    Expected Outcomes  Short Term goal: Utilizing psychosocial counselor, staff and physician to assist with identification of specific Stressors or current issues interfering with healing process. Setting desired goal for each stressor or current issue identified.;Long Term Goal: Stressors or current issues are  controlled or eliminated.;Short Term goal: Identification and review with participant of any Quality of Life or Depression concerns found by scoring the questionnaire.;Long Term goal: The participant improves quality of Life and PHQ9 Scores as seen by post scores and/or verbalization of changes       Quality of Life Scores:  Quality of Life - 05/28/18 1407      Quality of Life   Select  Quality of Life      Quality of Life Scores   Health/Function Pre  25.73 %  Socioeconomic Pre  21.88 %    Psych/Spiritual Pre  27.93 %    Family Pre  24 %    GLOBAL Pre  25.04 %      Scores of 19 and below usually indicate a poorer quality of life in these areas.  A difference of  2-3 points is a clinically meaningful difference.  A difference of 2-3 points in the total score of the Quality of Life Index has been associated with significant improvement in overall quality of life, self-image, physical symptoms, and general health in studies assessing change in quality of life.  PHQ-9: Recent Review Flowsheet Data    Depression screen [facility] 2/9 05/28/2018   Decreased Interest 2   Down, Depressed, Hopeless 0   PHQ - 2 Score 2   Altered sleeping 0   Tired, decreased energy 0   Change in appetite 1   Feeling bad or failure about yourself  0   Trouble concentrating 0   Moving slowly or fidgety/restless 0   Suicidal thoughts 0   PHQ-9 Score 3   Difficult doing work/chores Not difficult at all     Interpretation of Total Score  Total Score Depression Severity:  1-4 = Minimal depression, 5-9 = Mild depression, 10-14 = Moderate depression, 15-19 = Moderately severe depression, 20-27 = Severe depression   Psychosocial Evaluation and Intervention:   Psychosocial Re-Evaluation:   Psychosocial Discharge (Final Psychosocial Re-Evaluation):   Vocational Rehabilitation: Provide vocational rehab assistance to qualifying candidates.   Vocational Rehab Evaluation & Intervention: Vocational Rehab -  05/28/18 1412      Initial Vocational Rehab Evaluation & Intervention   Assessment shows need for Vocational Rehabilitation  No       Education: Education Goals: Education classes will be provided on a variety of topics geared toward better understanding of heart health and risk factor modification. Participant will state understanding/return demonstration of topics presented as noted by education test scores.  Learning Barriers/Preferences: Learning Barriers/Preferences - 05/28/18 1410      Learning Barriers/Preferences   Learning Barriers  Exercise Concerns    Learning Preferences  Computer/Internet;Verbal Instruction;Group Instruction;Skilled Demonstration;Video       Education Topics:  AED/CPR: - Group verbal and written instruction with the use of models to demonstrate the basic use of the AED with the basic ABC's of resuscitation.   General Nutrition Guidelines/Fats and Fiber: -Group instruction provided by verbal, written material, models and posters to present the general guidelines for heart healthy nutrition. Gives an explanation and review of dietary fats and fiber.   Controlling Sodium/Reading Food Labels: -Group verbal and written material supporting the discussion of sodium use in heart healthy nutrition. Review and explanation with models, verbal and written materials for utilization of the food label.   Exercise Physiology & General Exercise Guidelines: - Group verbal and written instruction with models to review the exercise physiology of the cardiovascular system and associated critical values. Provides general exercise guidelines with specific guidelines to those with heart or lung disease.    Aerobic Exercise & Resistance Training: - Gives group verbal and written instruction on the various components of exercise. Focuses on aerobic and resistive training programs and the benefits of this training and how to safely progress through these  programs..   Flexibility, Balance, Mind/Body Relaxation: Provides group verbal/written instruction on the benefits of flexibility and balance training, including mind/body exercise modes such as yoga, pilates and tai chi.  Demonstration and skill practice provided.   Stress and Anxiety: - Provides  group verbal and written instruction about the health risks of elevated stress and causes of high stress.  Discuss the correlation between heart/lung disease and anxiety and treatment options. Review healthy ways to manage with stress and anxiety.   Depression: - Provides group verbal and written instruction on the correlation between heart/lung disease and depressed mood, treatment options, and the stigmas associated with seeking treatment.   Anatomy & Physiology of the Heart: - Group verbal and written instruction and models provide basic cardiac anatomy and physiology, with the coronary electrical and arterial systems. Review of Valvular disease and Heart Failure   Cardiac Procedures: - Group verbal and written instruction to review commonly prescribed medications for heart disease. Reviews the medication, class of the drug, and side effects. Includes the steps to properly store meds and maintain the prescription regimen. (beta blockers and nitrates)   Cardiac Medications I: - Group verbal and written instruction to review commonly prescribed medications for heart disease. Reviews the medication, class of the drug, and side effects. Includes the steps to properly store meds and maintain the prescription regimen.   Cardiac Medications II: -Group verbal and written instruction to review commonly prescribed medications for heart disease. Reviews the medication, class of the drug, and side effects. (all other drug classes)    Go Sex-Intimacy & Heart Disease, Get SMART - Goal Setting: - Group verbal and written instruction through game format to discuss heart disease and the return to sexual  intimacy. Provides group verbal and written material to discuss and apply goal setting through the application of the S.M.A.R.T. Method.   Other Matters of the Heart: - Provides group verbal, written materials and models to describe Stable Angina and Peripheral Artery. Includes description of the disease process and treatment options available to the cardiac patient.   Exercise & Equipment Safety: - Individual verbal instruction and demonstration of equipment use and safety with use of the equipment.   Cardiac Rehab from 05/28/2018 in [facility] Cardiac and Pulmonary Rehab  Date  05/28/18  Educator  [facility]  Instruction Review Code  1- Verbalizes Understanding      Infection Prevention: - Provides verbal and written material to individual with discussion of infection control including proper hand washing and proper equipment cleaning during exercise session.   Cardiac Rehab from 05/28/2018 in [facility] Cardiac and Pulmonary Rehab  Date  05/28/18  Educator  [facility]  Instruction Review Code  1- Verbalizes Understanding      Falls Prevention: - Provides verbal and written material to individual with discussion of falls prevention and safety.   Cardiac Rehab from 05/28/2018 in [facility] Cardiac and Pulmonary Rehab  Date  05/28/18  Educator  [facility]  Instruction Review Code  1- Verbalizes Understanding      Diabetes: - Individual verbal and written instruction to review signs/symptoms of diabetes, desired ranges of glucose level fasting, after meals and with exercise. Acknowledge that pre and post exercise glucose checks will be done for 3 sessions at entry of program.   Know Your Numbers and Risk Factors: -Group verbal and written instruction about important numbers in your health.  Discussion of what are risk factors and how they play a role in the disease process.  Review of Cholesterol, Blood Pressure, Diabetes, and BMI and the role they play in your overall health.   Sleep Hygiene: -Provides group verbal and  written instruction about how sleep can affect your health.  Define sleep hygiene, discuss sleep cycles and impact of sleep habits. Review good sleep hygiene  tips.    Other: -Provides group and verbal instruction on various topics (see comments)   Knowledge Questionnaire Score: Knowledge Questionnaire Score - 05/28/18 1411      Knowledge Questionnaire Score   Pre Score  19/26 correct answers reviewed with Elberta Fortis, focus on exercise and nutrition       Core Components/Risk Factors/Patient Goals at Admission: Personal Goals and Risk Factors at Admission - 05/28/18 1356      Core Components/Risk Factors/Patient Goals on Admission    Weight Management  Yes;Weight Gain    Intervention  Weight Management: Develop a combined nutrition and exercise program designed to reach desired caloric intake, while maintaining appropriate intake of nutrient and fiber, sodium and fats, and appropriate energy expenditure required for the weight goal.;Weight Management: Provide education and appropriate resources to help participant work on and attain dietary goals.    Admit Weight  144 lb (65.3 kg)    Goal Weight: Short Term  148 lb (67.1 kg)    Goal Weight: Long Term  150 lb (68 kg)    Expected Outcomes  Short Term: Continue to assess and modify interventions until short term weight is achieved;Long Term: Adherence to nutrition and physical activity/exercise program aimed toward attainment of established weight goal;Weight Maintenance: Understanding of the daily nutrition guidelines, which includes 25-35% calories from fat, 7% or less cal from saturated fats, less than 233m cholesterol, less than 1.5gm of sodium, & 5 or more servings of fruits and vegetables daily;Understanding recommendations for meals to include 15-35% energy as protein, 25-35% energy from fat, 35-60% energy from carbohydrates, less than 203mof dietary cholesterol, 20-35 gm of total fiber daily;Understanding of distribution of calorie intake  throughout the day with the consumption of 4-5 meals/snacks;Weight Gain: Understanding of general recommendations for a high calorie, high protein meal plan that promotes weight gain by distributing calorie intake throughout the day with the consumption for 4-5 meals, snacks, and/or supplements    Tobacco Cessation  Yes quit 5/24    Intervention  Assist the participant in steps to quit. Provide individualized education and counseling about committing to Tobacco Cessation, relapse prevention, and pharmacological support that can be provided by physician.;OfAdvice workerassist with locating and accessing local/national Quit Smoking programs, and support quit date choice.    Expected Outcomes  Short Term: Will demonstrate readiness to quit, by selecting a quit date.;Long Term: Complete abstinence from all tobacco products for at least 12 months from quit date.;Short Term: Will quit all tobacco product use, adhering to prevention of relapse plan.    Hypertension  Yes    Intervention  Provide education on lifestyle modifcations including regular physical activity/exercise, weight management, moderate sodium restriction and increased consumption of fresh fruit, vegetables, and low fat dairy, alcohol moderation, and smoking cessation.;Monitor prescription use compliance.    Expected Outcomes  Long Term: Maintenance of blood pressure at goal levels.;Short Term: Continued assessment and intervention until BP is < 140/9028mG in hypertensive participants. < 130/85m51m in hypertensive participants with diabetes, heart failure or chronic kidney disease.    Lipids  Yes    Intervention  Provide education and support for participant on nutrition & aerobic/resistive exercise along with prescribed medications to achieve LDL <70mg55mL >40mg.25mExpected Outcomes  Short Term: Participant states understanding of desired cholesterol values and is compliant with medications prescribed. Participant is following  exercise prescription and nutrition guidelines.;Long Term: Cholesterol controlled with medications as prescribed, with individualized exercise RX and with personalized nutrition plan.  Value goals: LDL < 28m, HDL > 40 mg.       Core Components/Risk Factors/Patient Goals Review:    Core Components/Risk Factors/Patient Goals at Discharge (Final Review):    ITP Comments: ITP Comments    Row Name 05/28/18 1336           ITP Comments  Med Review completed. Initial ITP created. [facility]          Comments: Initial ITP

## 2018-05-28 NOTE — Patient Instructions (Signed)
Patient Instructions  Patient Details  Name: Mark Mack MRN: [id number] Date of Birth: Oct 19, 1956 Referring Provider:  Virl Axe, MD  Below are your personal goals for exercise, nutrition, and risk factors. Our goal is to help you stay on track towards obtaining and maintaining these goals. We will be discussing your progress on these goals with you throughout the program.  Initial Exercise Prescription: Initial Exercise Prescription - 05/28/18 1400      Date of Initial Exercise RX and Referring Provider   Date  05/28/18    Referring Provider  Ikonimidis      Treadmill   MPH  2.7    Grade  3    Minutes  15    METs  4.2      Recumbant Bike   Level  6    RPM  60    Watts  50    Minutes  15    METs  4.4      REL-XR   Level  4    Speed  50    Minutes  15    METs  4.4      Prescription Details   Frequency (times per week)  3    Duration  Progress to 45 minutes of aerobic exercise without signs/symptoms of physical distress      Intensity   THRR 40-80% of Max Heartrate  104-141    Ratings of Perceived Exertion  11-13    Perceived Dyspnea  0-4      Resistance Training   Training Prescription  Yes    Weight  3 lb    Reps  10-15       Exercise Goals: Frequency: Be able to perform aerobic exercise two to three times per week in program working toward 2-5 days per week of home exercise.  Intensity: Work with a perceived exertion of 11 (fairly light) - 15 (hard) while following your exercise prescription.  We will make changes to your prescription with you as you progress through the program.   Duration: Be able to do 30 to 45 minutes of continuous aerobic exercise in addition to a 5 minute warm-up and a 5 minute cool-down routine.   Nutrition Goals: Your personal nutrition goals will be established when you do your nutrition analysis with the dietician.  The following are general nutrition guidelines to follow: Cholesterol < 200mg /day Sodium <  1500mg /day Fiber: Men over 50 yrs - 30 grams per day  Personal Goals: Personal Goals and Risk Factors at Admission - 05/28/18 1356      Core Components/Risk Factors/Patient Goals on Admission    Weight Management  Yes;Weight Gain    Intervention  Weight Management: Develop a combined nutrition and exercise program designed to reach desired caloric intake, while maintaining appropriate intake of nutrient and fiber, sodium and fats, and appropriate energy expenditure required for the weight goal.;Weight Management: Provide education and appropriate resources to help participant work on and attain dietary goals.    Admit Weight  144 lb (65.3 kg)    Goal Weight: Short Term  148 lb (67.1 kg)    Goal Weight: Long Term  150 lb (68 kg)    Expected Outcomes  Short Term: Continue to assess and modify interventions until short term weight is achieved;Long Term: Adherence to nutrition and physical activity/exercise program aimed toward attainment of established weight goal;Weight Maintenance: Understanding of the daily nutrition guidelines, which includes 25-35% calories from fat, 7% or less cal from saturated fats, less than 200mg   cholesterol, less than 1.5gm of sodium, & 5 or more servings of fruits and vegetables daily;Understanding recommendations for meals to include 15-35% energy as protein, 25-35% energy from fat, 35-60% energy from carbohydrates, less than 200mg  of dietary cholesterol, 20-35 gm of total fiber daily;Understanding of distribution of calorie intake throughout the day with the consumption of 4-5 meals/snacks;Weight Gain: Understanding of general recommendations for a high calorie, high protein meal plan that promotes weight gain by distributing calorie intake throughout the day with the consumption for 4-5 meals, snacks, and/or supplements    Tobacco Cessation  Yes quit 5/24    Intervention  Assist the participant in steps to quit. Provide individualized education and counseling about  committing to Tobacco Cessation, relapse prevention, and pharmacological support that can be provided by physician.;Education officer, environmentalffer self-teaching materials, assist with locating and accessing local/national Quit Smoking programs, and support quit date choice.    Expected Outcomes  Short Term: Will demonstrate readiness to quit, by selecting a quit date.;Long Term: Complete abstinence from all tobacco products for at least 12 months from quit date.;Short Term: Will quit all tobacco product use, adhering to prevention of relapse plan.    Hypertension  Yes    Intervention  Provide education on lifestyle modifcations including regular physical activity/exercise, weight management, moderate sodium restriction and increased consumption of fresh fruit, vegetables, and low fat dairy, alcohol moderation, and smoking cessation.;Monitor prescription use compliance.    Expected Outcomes  Long Term: Maintenance of blood pressure at goal levels.;Short Term: Continued assessment and intervention until BP is < 140/6690mm HG in hypertensive participants. < 130/2280mm HG in hypertensive participants with diabetes, heart failure or chronic kidney disease.    Lipids  Yes    Intervention  Provide education and support for participant on nutrition & aerobic/resistive exercise along with prescribed medications to achieve LDL 70mg , HDL >40mg .    Expected Outcomes  Short Term: Participant states understanding of desired cholesterol values and is compliant with medications prescribed. Participant is following exercise prescription and nutrition guidelines.;Long Term: Cholesterol controlled with medications as prescribed, with individualized exercise RX and with personalized nutrition plan. Value goals: LDL < 70mg , HDL > 40 mg.       Tobacco Use Initial Evaluation: Social History   Tobacco Use  Smoking Status Former Smoker  . Last attempt to quit: 03/23/2018  . Years since quitting: 0.1  Smokeless Tobacco Never Used    Exercise Goals  and Review: Exercise Goals    Row Name 05/28/18 1419             Exercise Goals   Increase Physical Activity  Yes       Intervention  Provide advice, education, support and counseling about physical activity/exercise needs.;Develop an individualized exercise prescription for aerobic and resistive training based on initial evaluation findings, risk stratification, comorbidities and participant's personal goals.       Expected Outcomes  Short Term: Attend rehab on a regular basis to increase amount of physical activity.;Long Term: Add in home exercise to make exercise part of routine and to increase amount of physical activity.;Long Term: Exercising regularly at least 3-5 days a week.       Increase Strength and Stamina  Yes       Intervention  Provide advice, education, support and counseling about physical activity/exercise needs.;Develop an individualized exercise prescription for aerobic and resistive training based on initial evaluation findings, risk stratification, comorbidities and participant's personal goals.       Expected Outcomes  Short Term: Increase workloads  from initial exercise prescription for resistance, speed, and METs.;Short Term: Perform resistance training exercises routinely during rehab and add in resistance training at home;Long Term: Improve cardiorespiratory fitness, muscular endurance and strength as measured by increased METs and functional capacity ( )       Able to understand and use rate of perceived exertion (RPE) scale  Yes       Intervention  Provide education and explanation on how to use RPE scale       Expected Outcomes  Short Term: Able to use RPE daily in rehab to express subjective intensity level;Long Term:  Able to use RPE to guide intensity level when exercising independently       Able to understand and use Dyspnea scale  Yes       Intervention  Provide education and explanation on how to use Dyspnea scale       Expected Outcomes  Short Term: Able  to use Dyspnea scale daily in rehab to express subjective sense of shortness of breath during exertion;Long Term: Able to use Dyspnea scale to guide intensity level when exercising independently       Knowledge and understanding of Target Heart Rate Range (THRR)  Yes       Intervention  Provide education and explanation of THRR including how the numbers were predicted and where they are located for reference       Expected Outcomes  Short Term: Able to state/look up THRR;Short Term: Able to use daily as guideline for intensity in rehab;Long Term: Able to use THRR to govern intensity when exercising independently       Able to check pulse independently  Yes       Intervention  Provide education and demonstration on how to check pulse in carotid and radial arteries.;Review the importance of being able to check your own pulse for safety during independent exercise       Expected Outcomes  Short Term: Able to explain why pulse checking is important during independent exercise;Long Term: Able to check pulse independently and accurately       Understanding of Exercise Prescription  Yes       Intervention  Provide education, explanation, and written materials on patient's individual exercise prescription       Expected Outcomes  Short Term: Able to explain program exercise prescription;Long Term: Able to explain home exercise prescription to exercise independently          Copy of goals given to participant.

## 2018-06-01 ENCOUNTER — Encounter: Payer: BLUE CROSS/BLUE SHIELD | Admitting: *Deleted

## 2018-06-01 DIAGNOSIS — Z951 Presence of aortocoronary bypass graft: Secondary | ICD-10-CM | POA: Diagnosis not present

## 2018-06-01 NOTE — Progress Notes (Signed)
Daily Session Note  Patient Details  Name: Mark Mack MRN: [id number] Date of Birth: [birth year] Referring Provider:     Cardiac Rehab from 05/28/2018 in [facility] Cardiac and Pulmonary Rehab  Referring Provider  Ikonimidis      Encounter Date: 06/01/2018  Check In: Session Check In - 06/01/18 0806      Check-In   Supervising physician immediately available to respond to emergencies  See telemetry face sheet for immediately available ER MD    Location  [facility]-Cardiac & Pulmonary Rehab    Staff Present  Alberteen Sam, MA, RCEP, CCRP, Exercise Physiologist;Susanne Bice, RN, BSN, [employer], BS, ACSM CEP, Exercise Physiologist    Medication changes reported      No    Fall or balance concerns reported     No    Tobacco Cessation  No Change    Warm-up and Cool-down  Performed on first and last piece of equipment    Resistance Training Performed  Yes    VAD Patient?  No    PAD/SET Patient?  No      Pain Assessment   Currently in Pain?  No/denies    Multiple Pain Sites  No          Social History   Tobacco Use  Smoking Status Former Smoker  . Last attempt to quit: 03/23/2018  . Years since quitting: 0.1  Smokeless Tobacco Never Used    Goals Met:  Exercise tolerated well Personal goals reviewed No report of cardiac concerns or symptoms Strength training completed today  Goals Unmet:  Not Applicable  Comments: First full day of exercise!  Patient was oriented to gym and equipment including functions, settings, policies, and procedures.  Patient's individual exercise prescription and treatment plan were reviewed.  All starting workloads were established based on the results of the 6 minute walk test done at initial orientation visit.  The plan for exercise progression was also introduced and progression will be customized based on patient's performance and goals.    Dr. Emily Filbert is Medical Director for [facility] and [facility].

## 2018-06-03 ENCOUNTER — Encounter: Payer: BLUE CROSS/BLUE SHIELD | Admitting: *Deleted

## 2018-06-03 DIAGNOSIS — Z951 Presence of aortocoronary bypass graft: Secondary | ICD-10-CM

## 2018-06-03 NOTE — Progress Notes (Signed)
Daily Session Note  Patient Details  Name: Mark Mack MRN: [id number] Date of Birth: 02/27/1956 Referring Provider:     Cardiac Rehab from 05/28/2018 in [facility] Cardiac and Pulmonary Rehab  Referring Provider  Ikonimidis      Encounter Date: 06/03/2018  Check In: Session Check In - 06/03/18 0820      Check-In   Supervising physician immediately available to respond to emergencies  See telemetry face sheet for immediately available ER MD    Location  [facility]-Cardiac & Pulmonary Rehab    Staff Present  Heath Lark, RN, BSN, CCRP;Theoden Mauch [place], MA, RCEP, CCRP, Exercise Physiologist;Joseph Tessie Fass RCP,RRT,BSRT    Medication changes reported      No    Fall or balance concerns reported     No    Warm-up and Cool-down  Performed on first and last piece of equipment    Resistance Training Performed  Yes    VAD Patient?  No    PAD/SET Patient?  No      Pain Assessment   Currently in Pain?  No/denies        Exercise Prescription Changes - 06/02/18 1600      Response to Exercise   Blood Pressure (Admit)  134/70    Blood Pressure (Exercise)  200/90 180/80    Blood Pressure (Exit)  112/66    Heart Rate (Admit)  67 bpm    Heart Rate (Exercise)  119 bpm    Heart Rate (Exit)  85 bpm    Rating of Perceived Exertion (Exercise)  13    Symptoms  none    Comments  first full day of exercise    Duration  Progress to 45 minutes of aerobic exercise without signs/symptoms of physical distress    Intensity  THRR unchanged      Progression   Progression  Continue to progress workloads to maintain intensity without signs/symptoms of physical distress.    Average METs  4.26      Resistance Training   Training Prescription  Yes    Weight  4 lbs    Reps  10-15      Interval Training   Interval Training  No      Treadmill   MPH  2    Grade  5    Minutes  15    METs  3.91      REL-XR   Level  4    Minutes  15    METs  4.6       Social History   Tobacco Use  Smoking Status Former  Smoker  . Last attempt to quit: 03/23/2018  . Years since quitting: 0.1  Smokeless Tobacco Never Used    Goals Met:  Independence with exercise equipment Exercise tolerated well No report of cardiac concerns or symptoms Strength training completed today  Goals Unmet:  Not Applicable  Comments: Pt able to follow exercise prescription today without complaint.  Will continue to monitor for progression.    Dr. Emily Filbert is Medical Director for [place] and [facility].

## 2018-06-05 DIAGNOSIS — Z951 Presence of aortocoronary bypass graft: Secondary | ICD-10-CM | POA: Diagnosis not present

## 2018-06-05 NOTE — Progress Notes (Signed)
Daily Session Note  Patient Details  Name: Mark Mack MRN: [id number] Date of Birth: 25-Jul-1956 Referring Provider:     Cardiac Rehab from 05/28/2018 in [facility] Cardiac and Pulmonary Rehab  Referring Provider  Ikonimidis      Encounter Date: 06/05/2018  Check In:      Social History   Tobacco Use  Smoking Status Former Smoker  . Last attempt to quit: 03/23/2018  . Years since quitting: 0.2  Smokeless Tobacco Never Used    Goals Met:  Independence with exercise equipment Exercise tolerated well No report of cardiac concerns or symptoms Strength training completed today  Goals Unmet:  Not Applicable  Comments: Pt able to follow exercise prescription today without complaint.  Will continue to monitor for progression.    Dr. Emily Filbert is Medical Director for [place] and [facility].

## 2018-06-08 ENCOUNTER — Encounter: Payer: BLUE CROSS/BLUE SHIELD | Admitting: *Deleted

## 2018-06-08 DIAGNOSIS — Z951 Presence of aortocoronary bypass graft: Secondary | ICD-10-CM | POA: Diagnosis not present

## 2018-06-08 NOTE — Progress Notes (Signed)
Daily Session Note  Patient Details  Name: Derric Dealmeida MRN: [id number] Date of Birth: 1956/09/16 Referring Provider:     Cardiac Rehab from 05/28/2018 in [facility] Cardiac and Pulmonary Rehab  Referring Provider  Ikonimidis      Encounter Date: 06/08/2018  Check In: Session Check In - 06/08/18 0748      Check-In   Supervising physician immediately available to respond to emergencies  See telemetry face sheet for immediately available ER MD    Location  [facility]-Cardiac & Pulmonary Rehab    Staff Present  Heath Lark, RN, BSN, CCRP;Emin Foree Walsh, MA, RCEP, CCRP, Exercise Physiologist;Kelly Amedeo Plenty, BS, ACSM CEP, Exercise Physiologist    Medication changes reported      No    Fall or balance concerns reported     No    Warm-up and Cool-down  Performed on first and last piece of equipment    Resistance Training Performed  Yes    VAD Patient?  No    PAD/SET Patient?  No      Pain Assessment   Currently in Pain?  No/denies          Social History   Tobacco Use  Smoking Status Former Smoker  . Last attempt to quit: 03/23/2018  . Years since quitting: 0.2  Smokeless Tobacco Never Used    Goals Met:  Independence with exercise equipment Exercise tolerated well No report of cardiac concerns or symptoms Strength training completed today  Goals Unmet:  Not Applicable  Comments: Pt able to follow exercise prescription today without complaint.  Will continue to monitor for progression.    Dr. Emily Filbert is Medical Director for [place] and [facility].

## 2018-06-10 ENCOUNTER — Encounter: Payer: Self-pay | Admitting: *Deleted

## 2018-06-10 ENCOUNTER — Encounter: Payer: BLUE CROSS/BLUE SHIELD | Admitting: *Deleted

## 2018-06-10 DIAGNOSIS — Z951 Presence of aortocoronary bypass graft: Secondary | ICD-10-CM | POA: Diagnosis not present

## 2018-06-10 NOTE — Progress Notes (Signed)
Daily Session Note  Patient Details  Name: Mark Mack MRN: [id number] Date of Birth: 1956-01-05 Referring Provider:     Cardiac Rehab from 05/28/2018 in [facility] Cardiac and Pulmonary Rehab  Referring Provider  Ikonimidis      Encounter Date: 06/10/2018  Check In: Session Check In - 06/10/18 0735      Check-In   Supervising physician immediately available to respond to emergencies  See telemetry face sheet for immediately available ER MD    Location  [facility]-Cardiac & Pulmonary Rehab    Staff Present  Nyoka Cowden, RN, BSN, Cheri Fowler, RN BSN;Jessica Luan Pulling, MA, RCEP, CCRP, Exercise Physiologist    Medication changes reported      No    Fall or balance concerns reported     No    Warm-up and Cool-down  Performed on first and last piece of equipment    Resistance Training Performed  Yes    VAD Patient?  No    PAD/SET Patient?  No      Pain Assessment   Currently in Pain?  No/denies          Social History   Tobacco Use  Smoking Status Former Smoker  . Last attempt to quit: 03/23/2018  . Years since quitting: 0.2  Smokeless Tobacco Never Used    Goals Met:  Independence with exercise equipment Exercise tolerated well No report of cardiac concerns or symptoms Strength training completed today  Goals Unmet:  Not Applicable  Comments: Pt able to follow exercise prescription today without complaint.  Will continue to monitor for progression.    Dr. Emily Filbert is Medical Director for [place] and [facility].

## 2018-06-10 NOTE — Progress Notes (Signed)
Cardiac Individual Treatment Plan  Patient Details  Name: Mark Mack MRN: [id number] Date of Birth: 19-Feb-1956 Referring Provider:     Cardiac Rehab from 05/28/2018 in [facility] Cardiac and Pulmonary Rehab  Referring Provider  Ikonimidis      Initial Encounter Date:    Cardiac Rehab from 05/28/2018 in [facility] Cardiac and Pulmonary Rehab  Date  05/28/18      Visit Diagnosis: S/P CABG x 4  Patient's Home Medications on Admission:  Current Outpatient Medications:  .  acetaminophen (TYLENOL) 500 MG tablet, Take 1,000 mg by mouth every 6 (six) hours as needed. , Disp: , Rfl:  .  aspirin EC 81 MG tablet, Take 81 mg by mouth daily. , Disp: , Rfl:  .  docusate sodium (COLACE) 100 MG capsule, Take 1 capsule (100 mg total) by mouth daily. (Patient not taking: Reported on 05/28/2018), Disp: 10 capsule, Rfl: 0 .  esomeprazole (NEXIUM) 20 MG packet, Take 20 mg by mouth daily before breakfast. , Disp: , Rfl:  .  ezetimibe (ZETIA) 10 MG tablet, Take 10 mg by mouth daily., Disp: , Rfl: 2 .  ferrous sulfate 325 (65 FE) MG EC tablet, Take 1 tablet (325 mg total) by mouth daily with breakfast. (Patient not taking: Reported on 05/28/2018), Disp: 60 tablet, Rfl: 3 .  furosemide (LASIX) 20 MG tablet, Take 20 mg by mouth daily., Disp: , Rfl: 0 .  metoprolol succinate (TOPROL XL) 50 MG 24 hr tablet, Take 50 mg by mouth daily. , Disp: , Rfl:  .  ranitidine (ZANTAC) 150 MG tablet, , Disp: , Rfl:  .  rosuvastatin (CRESTOR) 20 MG tablet, Take 1 tablet (20 mg total) by mouth at bedtime. (Patient not taking: Reported on 05/28/2018), Disp: 30 tablet, Rfl: 0 .  senna-docusate (SENOKOT-S) 8.6-50 MG tablet, Take 1 tablet by mouth at bedtime as needed for mild constipation. (Patient not taking: Reported on 05/28/2018), Disp: , Rfl:   Past Medical History: Past Medical History:  Diagnosis Date  . Hypertension     Tobacco Use: Social History   Tobacco Use  Smoking Status Former Smoker  . Last attempt to quit: 03/23/2018  . Years  since quitting: 0.2  Smokeless Tobacco Never Used    Labs: Recent Review Flowsheet Data    There is no flowsheet data to display.       Exercise Target Goals: Exercise Program Goal: Individual exercise prescription set using results from initial 6 min walk test and THRR while considering  patient's activity barriers and safety.   Exercise Prescription Goal: Initial exercise prescription builds to 30-45 minutes a day of aerobic activity, 2-3 days per week.  Home exercise guidelines will be given to patient during program as part of exercise prescription that the participant will acknowledge.  Activity Barriers & Risk Stratification: Activity Barriers & Cardiac Risk Stratification - 05/28/18 1413      Activity Barriers & Cardiac Risk Stratification   Activity Barriers  Arthritis;Right Hip Replacement;Joint Problems   Needs Left Hip Replacement   Cardiac Risk Stratification  High       6 Minute Walk: 6 Minute Walk    Row Name 05/28/18 1419         6 Minute Walk   Distance  1414 feet     Walk Time  6 minutes     # of Rest Breaks  0     MPH  2.68     METS  4.43     RPE  11  Perceived Dyspnea   0     VO2 Peak  15.5     Symptoms  No     Resting HR  67 bpm     Resting BP  122/64     Resting Oxygen Saturation   98 %     Exercise Oxygen Saturation  during 6 min walk  99 %     Max Ex. HR  130 bpm     Max Ex. BP  160/70     2 Minute Post BP  120/70        Oxygen Initial Assessment:   Oxygen Re-Evaluation:   Oxygen Discharge (Final Oxygen Re-Evaluation):   Initial Exercise Prescription: Initial Exercise Prescription - 05/28/18 1400      Date of Initial Exercise RX and Referring Provider   Date  05/28/18    Referring Provider  Ikonimidis      Treadmill   MPH  2.7    Grade  3    Minutes  15    METs  4.2      Recumbant Bike   Level  6    RPM  60    Watts  50    Minutes  15    METs  4.4      REL-XR   Level  4    Speed  50    Minutes  15    METs   4.4      Prescription Details   Frequency (times per week)  3    Duration  Progress to 45 minutes of aerobic exercise without signs/symptoms of physical distress      Intensity   THRR 40-80% of Max Heartrate  104-141    Ratings of Perceived Exertion  11-13    Perceived Dyspnea  0-4      Resistance Training   Training Prescription  Yes    Weight  3 lb    Reps  10-15       Perform Capillary Blood Glucose checks as needed.  Exercise Prescription Changes:  Exercise Prescription Changes    Row Name 05/28/18 1400 06/02/18 1600           Response to Exercise   Blood Pressure (Admit)  122/64  134/70      Blood Pressure (Exercise)  160/70  200/90 180/80      Blood Pressure (Exit)  120/70  112/66      Heart Rate (Admit)  62 bpm  67 bpm      Heart Rate (Exercise)  130 bpm  119 bpm      Heart Rate (Exit)  60 bpm  85 bpm      Oxygen Saturation (Admit)  98 %  -      Oxygen Saturation (Exercise)  99 %  -      Rating of Perceived Exertion (Exercise)  11  13      Symptoms  -  none      Comments  -  first full day of exercise      Duration  -  Progress to 45 minutes of aerobic exercise without signs/symptoms of physical distress      Intensity  -  THRR unchanged        Progression   Progression  -  Continue to progress workloads to maintain intensity without signs/symptoms of physical distress.      Average METs  -  4.26        Resistance Training   Training Prescription  -  Yes      Weight  -  4 lbs      Reps  -  10-15        Interval Training   Interval Training  -  No        Treadmill   MPH  -  2      Grade  -  5      Minutes  -  15      METs  -  3.91        REL-XR   Level  -  4      Minutes  -  15      METs  -  4.6         Exercise Comments:  Exercise Comments    Row Name 06/01/18 0809           Exercise Comments   First full day of exercise!  Patient was oriented to gym and equipment including functions, settings, policies, and procedures.  Patient's  individual exercise prescription and treatment plan were reviewed.  All starting workloads were established based on the results of the 6 minute walk test done at initial orientation visit.  The plan for exercise progression was also introduced and progression will be customized based on patient's performance and goals.          Exercise Goals and Review:  Exercise Goals    Row Name 05/28/18 1419             Exercise Goals   Increase Physical Activity  Yes       Intervention  Provide advice, education, support and counseling about physical activity/exercise needs.;Develop an individualized exercise prescription for aerobic and resistive training based on initial evaluation findings, risk stratification, comorbidities and participant's personal goals.       Expected Outcomes  Short Term: Attend rehab on a regular basis to increase amount of physical activity.;Long Term: Add in home exercise to make exercise part of routine and to increase amount of physical activity.;Long Term: Exercising regularly at least 3-5 days a week.       Increase Strength and Stamina  Yes       Intervention  Provide advice, education, support and counseling about physical activity/exercise needs.;Develop an individualized exercise prescription for aerobic and resistive training based on initial evaluation findings, risk stratification, comorbidities and participant's personal goals.       Expected Outcomes  Short Term: Increase workloads from initial exercise prescription for resistance, speed, and METs.;Short Term: Perform resistance training exercises routinely during rehab and add in resistance training at home;Long Term: Improve cardiorespiratory fitness, muscular endurance and strength as measured by increased METs and functional capacity (6MWT)       Able to understand and use rate of perceived exertion (RPE) scale  Yes       Intervention  Provide education and explanation on how to use RPE scale       Expected  Outcomes  Short Term: Able to use RPE daily in rehab to express subjective intensity level;Long Term:  Able to use RPE to guide intensity level when exercising independently       Able to understand and use Dyspnea scale  Yes       Intervention  Provide education and explanation on how to use Dyspnea scale       Expected Outcomes  Short Term: Able to use Dyspnea scale daily in rehab to express subjective sense of shortness of breath during exertion;Long Term: Able  to use Dyspnea scale to guide intensity level when exercising independently       Knowledge and understanding of Target Heart Rate Range (THRR)  Yes       Intervention  Provide education and explanation of THRR including how the numbers were predicted and where they are located for reference       Expected Outcomes  Short Term: Able to state/look up THRR;Short Term: Able to use daily as guideline for intensity in rehab;Long Term: Able to use THRR to govern intensity when exercising independently       Able to check pulse independently  Yes       Intervention  Provide education and demonstration on how to check pulse in carotid and radial arteries.;Review the importance of being able to check your own pulse for safety during independent exercise       Expected Outcomes  Short Term: Able to explain why pulse checking is important during independent exercise;Long Term: Able to check pulse independently and accurately       Understanding of Exercise Prescription  Yes       Intervention  Provide education, explanation, and written materials on patient's individual exercise prescription       Expected Outcomes  Short Term: Able to explain program exercise prescription;Long Term: Able to explain home exercise prescription to exercise independently          Exercise Goals Re-Evaluation : Exercise Goals Re-Evaluation    Row Name 06/01/18 0809             Exercise Goal Re-Evaluation   Exercise Goals Review  Increase Physical  Activity;Increase Strength and Stamina;Able to understand and use rate of perceived exertion (RPE) scale;Able to check pulse independently;Knowledge and understanding of Target Heart Rate Range (THRR);Understanding of Exercise Prescription       Comments  Reviewed RPE scale, THR and program prescription with pt today.  Pt voiced understanding and was given a copy of goals to take home.        Expected Outcomes  Short: Use RPE daily to regulate intensity. Long: Follow program prescription in THR.          Discharge Exercise Prescription (Final Exercise Prescription Changes): Exercise Prescription Changes - 06/02/18 1600      Response to Exercise   Blood Pressure (Admit)  134/70    Blood Pressure (Exercise)  200/90   180/80   Blood Pressure (Exit)  112/66    Heart Rate (Admit)  67 bpm    Heart Rate (Exercise)  119 bpm    Heart Rate (Exit)  85 bpm    Rating of Perceived Exertion (Exercise)  13    Symptoms  none    Comments  first full day of exercise    Duration  Progress to 45 minutes of aerobic exercise without signs/symptoms of physical distress    Intensity  THRR unchanged      Progression   Progression  Continue to progress workloads to maintain intensity without signs/symptoms of physical distress.    Average METs  4.26      Resistance Training   Training Prescription  Yes    Weight  4 lbs    Reps  10-15      Interval Training   Interval Training  No      Treadmill   MPH  2    Grade  5    Minutes  15    METs  3.91      REL-XR   Level  4    Minutes  15    METs  4.6       Nutrition:  Target Goals: Understanding of nutrition guidelines, daily intake of sodium '1500mg'$ , cholesterol '200mg'$ , calories 30% from fat and 7% or less from saturated fats, daily to have 5 or more servings of fruits and vegetables.  Biometrics: Pre Biometrics - 05/28/18 1417      Pre Biometrics   Height  '5\' 6"'$  (1.676 m)    Weight  144 lb (65.3 kg)    Waist Circumference  30.5 inches     Hip Circumference  37.5 inches    Waist to Hip Ratio  0.81 %    BMI (Calculated)  23.25    Single Leg Stand  14.5 seconds        Nutrition Therapy Plan and Nutrition Goals: Nutrition Therapy & Goals - 06/08/18 0954      Nutrition Therapy   Diet  TLC    Protein (specify units)  10oz    Fiber  30 grams    Whole Grain Foods  3 servings   does not always choose whole grains   Saturated Fats  16 max. grams    Fruits and Vegetables  5 servings/day   8 ideal, does not eat fruit regularly   Sodium  2000 grams      Personal Nutrition Goals   Nutrition Goal  Include fruits as snacks between meals to both increase daily intake of fruits and to choose more nutritious snack options    Personal Goal #2  Work to decrease sugar sweetened beverage consumption; for example, choose PowerAde Zero rather than regular PowerAde    Personal Goal #3  Choose low-sugar cereal options. A low-sugar option will have 10g of sugar or less per serving    Personal Goal #4  Practice reading nutrition facts labels to identify foods high and low in sodium    Comments  He currently chooses processed and high sugar snack options, consumes sugar sweetened beverages IE sweet tea and soda daily, chooses some fried foods, and does not eat fruits other than bananas regularly.      Intervention Plan   Intervention  Prescribe, educate and counsel regarding individualized specific dietary modifications aiming towards targeted core components such as weight, hypertension, lipid management, diabetes, heart failure and other comorbidities.;Nutrition handout(s) given to patient.   low sodium nutrition therapy   Expected Outcomes  Short Term Goal: Understand basic principles of dietary content, such as calories, fat, sodium, cholesterol and nutrients.;Short Term Goal: A plan has been developed with personal nutrition goals set during dietitian appointment.;Long Term Goal: Adherence to prescribed nutrition plan.       Nutrition  Assessments: Nutrition Assessments - 05/28/18 1359      MEDFICTS Scores   Pre Score  45       Nutrition Goals Re-Evaluation: Nutrition Goals Re-Evaluation    [place] Name 06/08/18 1005             Goals   Nutrition Goal  Include fruits as snacks between meals to both increase daily intake of fruits and to choose more nutritious snack options       Comment  He does not consume fruits regularly other than bananas and chooses snacks like chips, little debby's, chocolate covered peanuts, and oatmeal cream pies       Expected Outcome  He will choose fruits as snacks more regularly, and also incorporate other snacks with greater nutritional value such as [employer] bars or  trail mix         Personal Goal #2 Re-Evaluation   Personal Goal #2  Work to decrease sugar sweetened beverage consumption; for example choose PowerAde Zero rather than regular PoweraAde         Personal Goal #3 Re-Evaluation   Personal Goal #3  Choose low-sugar cereal options. A low sugar option will have 10g of sugar or less per serving         Personal Goal #4 Re-Evaluation   Personal Goal #4  Practice reading nutrition facts labels to identify foods high and low in sodium          Nutrition Goals Discharge (Final Nutrition Goals Re-Evaluation): Nutrition Goals Re-Evaluation - 06/08/18 1005      Goals   Nutrition Goal  Include fruits as snacks between meals to both increase daily intake of fruits and to choose more nutritious snack options    Comment  He does not consume fruits regularly other than bananas and chooses snacks like chips, little debby's, chocolate covered peanuts, and oatmeal cream pies    Expected Outcome  He will choose fruits as snacks more regularly, and also incorporate other snacks with greater nutritional value such as [employer] or trail mix      Personal Goal #2 Re-Evaluation   Personal Goal #2  Work to decrease sugar sweetened beverage consumption; for example choose PowerAde  Zero rather than regular PoweraAde      Personal Goal #3 Re-Evaluation   Personal Goal #3  Choose low-sugar cereal options. A low sugar option will have 10g of sugar or less per serving      Personal Goal #4 Re-Evaluation   Personal Goal #4  Practice reading nutrition facts labels to identify foods high and low in sodium       Psychosocial: Target Goals: Acknowledge presence or absence of significant depression and/or stress, maximize coping skills, provide positive support system. Participant is able to verbalize types and ability to use techniques and skills needed for reducing stress and depression.   Initial Review & Psychosocial Screening: Initial Psych Review & Screening - 05/28/18 1359      Initial Review   Current issues with  Current Stress Concerns    Comments  Returning to work the end of August. Works painting car parts in the heat, long hours. He knows he needs to take it easy returning to work. He is ready to get back to his normal routine.       Family Dynamics   Good Support System?  Yes   Wife, family     Barriers   Psychosocial barriers to participate in program  There are no identifiable barriers or psychosocial needs.;The patient should benefit from training in stress management and relaxation.      Screening Interventions   Interventions  Program counselor consult;To provide support and resources with identified psychosocial needs;Encouraged to exercise;Provide feedback about the scores to participant    Expected Outcomes  Short Term goal: Utilizing psychosocial counselor, staff and physician to assist with identification of specific Stressors or current issues interfering with healing process. Setting desired goal for each stressor or current issue identified.;Long Term Goal: Stressors or current issues are controlled or eliminated.;Short Term goal: Identification and review with participant of any Quality of Life or Depression concerns found by scoring the  questionnaire.;Long Term goal: The participant improves quality of Life and PHQ9 Scores as seen by post scores and/or verbalization of changes  Quality of Life Scores:  Quality of Life - 05/28/18 1407      Quality of Life   Select  Quality of Life      Quality of Life Scores   Health/Function Pre  25.73 %    Socioeconomic Pre  21.88 %    Psych/Spiritual Pre  27.93 %    Family Pre  24 %    GLOBAL Pre  25.04 %      Scores of 19 and below usually indicate a poorer quality of life in these areas.  A difference of  2-3 points is a clinically meaningful difference.  A difference of 2-3 points in the total score of the Quality of Life Index has been associated with significant improvement in overall quality of life, self-image, physical symptoms, and general health in studies assessing change in quality of life.  PHQ-9: Recent Review Flowsheet Data    Depression screen [facility] 2/9 05/28/2018   Decreased Interest 2   Down, Depressed, Hopeless 0   PHQ - 2 Score 2   Altered sleeping 0   Tired, decreased energy 0   Change in appetite 1   Feeling bad or failure about yourself  0   Trouble concentrating 0   Moving slowly or fidgety/restless 0   Suicidal thoughts 0   PHQ-9 Score 3   Difficult doing work/chores Not difficult at all     Interpretation of Total Score  Total Score Depression Severity:  1-4 = Minimal depression, 5-9 = Mild depression, 10-14 = Moderate depression, 15-19 = Moderately severe depression, 20-27 = Severe depression   Psychosocial Evaluation and Intervention:   Psychosocial Re-Evaluation:   Psychosocial Discharge (Final Psychosocial Re-Evaluation):   Vocational Rehabilitation: Provide vocational rehab assistance to qualifying candidates.   Vocational Rehab Evaluation & Intervention: Vocational Rehab - 05/28/18 1412      Initial Vocational Rehab Evaluation & Intervention   Assessment shows need for Vocational Rehabilitation  No        Education: Education Goals: Education classes will be provided on a variety of topics geared toward better understanding of heart health and risk factor modification. Participant will state understanding/return demonstration of topics presented as noted by education test scores.  Learning Barriers/Preferences: Learning Barriers/Preferences - 05/28/18 1410      Learning Barriers/Preferences   Learning Barriers  Exercise Concerns    Learning Preferences  Computer/Internet;Verbal Instruction;Group Instruction;Skilled Demonstration;Video       Education Topics:  AED/CPR: - Group verbal and written instruction with the use of models to demonstrate the basic use of the AED with the basic ABC's of resuscitation.   General Nutrition Guidelines/Fats and Fiber: -Group instruction provided by verbal, written material, models and posters to present the general guidelines for heart healthy nutrition. Gives an explanation and review of dietary fats and fiber.   Controlling Sodium/Reading Food Labels: -Group verbal and written material supporting the discussion of sodium use in heart healthy nutrition. Review and explanation with models, verbal and written materials for utilization of the food label.   Exercise Physiology & General Exercise Guidelines: - Group verbal and written instruction with models to review the exercise physiology of the cardiovascular system and associated critical values. Provides general exercise guidelines with specific guidelines to those with heart or lung disease.    Aerobic Exercise & Resistance Training: - Gives group verbal and written instruction on the various components of exercise. Focuses on aerobic and resistive training programs and the benefits of this training and how to safely progress through  these programs..   Cardiac Rehab from 06/10/2018 in [facility] Cardiac and Pulmonary Rehab  Date  06/01/18  Educator  [facility]  Instruction Review Code  1- Verbalizes  Understanding      Flexibility, Balance, Mind/Body Relaxation: Provides group verbal/written instruction on the benefits of flexibility and balance training, including mind/body exercise modes such as yoga, pilates and tai chi.  Demonstration and skill practice provided.   Cardiac Rehab from 06/10/2018 in [facility] Cardiac and Pulmonary Rehab  Date  06/08/18  Educator  [facility]  Instruction Review Code  1- Verbalizes Understanding      Stress and Anxiety: - Provides group verbal and written instruction about the health risks of elevated stress and causes of high stress.  Discuss the correlation between heart/lung disease and anxiety and treatment options. Review healthy ways to manage with stress and anxiety.   Depression: - Provides group verbal and written instruction on the correlation between heart/lung disease and depressed mood, treatment options, and the stigmas associated with seeking treatment.   Cardiac Rehab from 06/10/2018 in [facility] Cardiac and Pulmonary Rehab  Date  06/03/18  Educator  [facility]  Instruction Review Code  1- Verbalizes Understanding      Anatomy & Physiology of the Heart: - Group verbal and written instruction and models provide basic cardiac anatomy and physiology, with the coronary electrical and arterial systems. Review of Valvular disease and Heart Failure   Cardiac Procedures: - Group verbal and written instruction to review commonly prescribed medications for heart disease. Reviews the medication, class of the drug, and side effects. Includes the steps to properly store meds and maintain the prescription regimen. (beta blockers and nitrates)   Cardiac Medications I: - Group verbal and written instruction to review commonly prescribed medications for heart disease. Reviews the medication, class of the drug, and side effects. Includes the steps to properly store meds and maintain the prescription regimen.   Cardiac Medications II: -Group verbal and written  instruction to review commonly prescribed medications for heart disease. Reviews the medication, class of the drug, and side effects. (all other drug classes)   Cardiac Rehab from 06/10/2018 in [facility] Cardiac and Pulmonary Rehab  Date  06/10/18  Educator  [place]  Instruction Review Code  1- Verbalizes Understanding       Go Sex-Intimacy & Heart Disease, Get SMART - Goal Setting: - Group verbal and written instruction through game format to discuss heart disease and the return to sexual intimacy. Provides group verbal and written material to discuss and apply goal setting through the application of the S.M.A.R.T. Method.   Other Matters of the Heart: - Provides group verbal, written materials and models to describe Stable Angina and Peripheral Artery. Includes description of the disease process and treatment options available to the cardiac patient.   Exercise & Equipment Safety: - Individual verbal instruction and demonstration of equipment use and safety with use of the equipment.   Cardiac Rehab from 06/10/2018 in [facility] Cardiac and Pulmonary Rehab  Date  05/28/18  Educator  [facility]  Instruction Review Code  1- Verbalizes Understanding      Infection Prevention: - Provides verbal and written material to individual with discussion of infection control including proper hand washing and proper equipment cleaning during exercise session.   Cardiac Rehab from 06/10/2018 in [facility] Cardiac and Pulmonary Rehab  Date  05/28/18  Educator  [facility]  Instruction Review Code  1- Verbalizes Understanding      Falls Prevention: - Provides verbal and written material to individual  with discussion of falls prevention and safety.   Cardiac Rehab from 06/10/2018 in [facility] Cardiac and Pulmonary Rehab  Date  05/28/18  Educator  [facility]  Instruction Review Code  1- Verbalizes Understanding      Diabetes: - Individual verbal and written instruction to review signs/symptoms of diabetes, desired ranges of glucose level  fasting, after meals and with exercise. Acknowledge that pre and post exercise glucose checks will be done for 3 sessions at entry of program.   Know Your Numbers and Risk Factors: -Group verbal and written instruction about important numbers in your health.  Discussion of what are risk factors and how they play a role in the disease process.  Review of Cholesterol, Blood Pressure, Diabetes, and BMI and the role they play in your overall health.   Cardiac Rehab from 06/10/2018 in [facility] Cardiac and Pulmonary Rehab  Date  06/10/18  Educator  [place]  Instruction Review Code  1- Verbalizes Understanding      Sleep Hygiene: -Provides group verbal and written instruction about how sleep can affect your health.  Define sleep hygiene, discuss sleep cycles and impact of sleep habits. Review good sleep hygiene tips.    Other: -Provides group and verbal instruction on various topics (see comments)   Knowledge Questionnaire Score: Knowledge Questionnaire Score - 05/28/18 1411      Knowledge Questionnaire Score   Pre Score  19/26   correct answers reviewed with Elberta Fortis, focus on exercise and nutrition      Core Components/Risk Factors/Patient Goals at Admission: Personal Goals and Risk Factors at Admission - 05/28/18 1356      Core Components/Risk Factors/Patient Goals on Admission    Weight Management  Yes;Weight Gain    Intervention  Weight Management: Develop a combined nutrition and exercise program designed to reach desired caloric intake, while maintaining appropriate intake of nutrient and fiber, sodium and fats, and appropriate energy expenditure required for the weight goal.;Weight Management: Provide education and appropriate resources to help participant work on and attain dietary goals.    Admit Weight  144 lb (65.3 kg)    Goal Weight: Short Term  148 lb (67.1 kg)    Goal Weight: Long Term  150 lb (68 kg)    Expected Outcomes  Short Term: Continue to assess and modify interventions  until short term weight is achieved;Long Term: Adherence to nutrition and physical activity/exercise program aimed toward attainment of established weight goal;Weight Maintenance: Understanding of the daily nutrition guidelines, which includes 25-35% calories from fat, 7% or less cal from saturated fats, less than '200mg'$  cholesterol, less than 1.5gm of sodium, & 5 or more servings of fruits and vegetables daily;Understanding recommendations for meals to include 15-35% energy as protein, 25-35% energy from fat, 35-60% energy from carbohydrates, less than '200mg'$  of dietary cholesterol, 20-35 gm of total fiber daily;Understanding of distribution of calorie intake throughout the day with the consumption of 4-5 meals/snacks;Weight Gain: Understanding of general recommendations for a high calorie, high protein meal plan that promotes weight gain by distributing calorie intake throughout the day with the consumption for 4-5 meals, snacks, and/or supplements    Tobacco Cessation  Yes   quit 5/24   Intervention  Assist the participant in steps to quit. Provide individualized education and counseling about committing to Tobacco Cessation, relapse prevention, and pharmacological support that can be provided by physician.;Advice worker, assist with locating and accessing local/national Quit Smoking programs, and support quit date choice.    Expected Outcomes  Short Term: Will  demonstrate readiness to quit, by selecting a quit date.;Long Term: Complete abstinence from all tobacco products for at least 12 months from quit date.;Short Term: Will quit all tobacco product use, adhering to prevention of relapse plan.    Hypertension  Yes    Intervention  Provide education on lifestyle modifcations including regular physical activity/exercise, weight management, moderate sodium restriction and increased consumption of fresh fruit, vegetables, and low fat dairy, alcohol moderation, and smoking cessation.;Monitor  prescription use compliance.    Expected Outcomes  Long Term: Maintenance of blood pressure at goal levels.;Short Term: Continued assessment and intervention until BP is < 140/64m HG in hypertensive participants. < 130/848mHG in hypertensive participants with diabetes, heart failure or chronic kidney disease.    Lipids  Yes    Intervention  Provide education and support for participant on nutrition & aerobic/resistive exercise along with prescribed medications to achieve LDL '70mg'$ , HDL >'40mg'$ .    Expected Outcomes  Short Term: Participant states understanding of desired cholesterol values and is compliant with medications prescribed. Participant is following exercise prescription and nutrition guidelines.;Long Term: Cholesterol controlled with medications as prescribed, with individualized exercise RX and with personalized nutrition plan. Value goals: LDL < '70mg'$ , HDL > 40 mg.       Core Components/Risk Factors/Patient Goals Review:    Core Components/Risk Factors/Patient Goals at Discharge (Final Review):    ITP Comments: ITP Comments    Row Name 05/28/18 1336 06/10/18 0757         ITP Comments  Med Review completed. Initial ITP created. [facility]  30 day review completed. ITP sent to Dr. BeRamonita Labcovering for Dr. MaEmily FilbertMedical Director of Cardiac Rehab. Continue with ITP unless changes are made by physician.  New to program         Comments: 30 day review

## 2018-06-12 ENCOUNTER — Encounter: Payer: BLUE CROSS/BLUE SHIELD | Admitting: *Deleted

## 2018-06-12 DIAGNOSIS — Z951 Presence of aortocoronary bypass graft: Secondary | ICD-10-CM | POA: Diagnosis not present

## 2018-06-12 NOTE — Progress Notes (Signed)
Daily Session Note  Patient Details  Name: Mark Mack MRN: [id number] Date of Birth: 04/22/1956 Referring Provider:     Cardiac Rehab from 05/28/2018 in [facility] Cardiac and Pulmonary Rehab  Referring Provider  Ikonimidis      Encounter Date: 06/12/2018  Check In: Session Check In - 06/12/18 0835      Check-In   Supervising physician immediately available to respond to emergencies  See telemetry face sheet for immediately available ER MD    Location  [facility]-Cardiac & Pulmonary Rehab    Staff Present  Renita Papa, RN BSN;Braedin Millhouse Luan Pulling, MA, RCEP, CCRP, Exercise Physiologist;Amanda Oletta Darter, [place], ACSM CEP, Exercise Physiologist    Medication changes reported      No    Fall or balance concerns reported     No    Warm-up and Cool-down  Performed on first and last piece of equipment    Resistance Training Performed  Yes    VAD Patient?  No    PAD/SET Patient?  No      Pain Assessment   Currently in Pain?  No/denies          Social History   Tobacco Use  Smoking Status Former Smoker  . Last attempt to quit: 03/23/2018  . Years since quitting: 0.2  Smokeless Tobacco Never Used    Goals Met:  Independence with exercise equipment Exercise tolerated well No report of cardiac concerns or symptoms Strength training completed today  Goals Unmet:  Not Applicable  Comments: Pt able to follow exercise prescription today without complaint.  Will continue to monitor for progression. Reviewed home exercise with pt today.  Pt plans to walk at home and at track at [facility] for exercise.  Reviewed THR, pulse, RPE, sign and symptoms, NTG use, and when to call 911 or MD.  Also discussed weather considerations and indoor options.  Pt voiced understanding.    Dr. Emily Filbert is Medical Director for [place] and [facility].

## 2018-06-15 ENCOUNTER — Encounter: Payer: BLUE CROSS/BLUE SHIELD | Admitting: *Deleted

## 2018-06-15 DIAGNOSIS — Z951 Presence of aortocoronary bypass graft: Secondary | ICD-10-CM | POA: Diagnosis not present

## 2018-06-15 NOTE — Progress Notes (Signed)
Daily Session Note  Patient Details  Name: Mark Mack MRN: [id number] Date of Birth: November 23, 1955 Referring Provider:     Cardiac Rehab from 05/28/2018 in [facility] Cardiac and Pulmonary Rehab  Referring Provider  Ikonimidis      Encounter Date: 06/15/2018  Check In: Session Check In - 06/15/18 0741      Check-In   Supervising physician immediately available to respond to emergencies  See telemetry face sheet for immediately available ER MD    Location  [facility]-Cardiac & Pulmonary Rehab    Staff Present  Gerlene Burdock, RN, Moises Blood, BS, ACSM CEP, Exercise Physiologist;Isaih Bulger Luan Pulling, MA, RCEP, CCRP, Exercise Physiologist    Medication changes reported      No    Fall or balance concerns reported     No    Warm-up and Cool-down  Performed on first and last piece of equipment    Resistance Training Performed  Yes    VAD Patient?  No    PAD/SET Patient?  No      Pain Assessment   Currently in Pain?  No/denies          Social History   Tobacco Use  Smoking Status Former Smoker  . Last attempt to quit: 03/23/2018  . Years since quitting: 0.2  Smokeless Tobacco Never Used    Goals Met:  Independence with exercise equipment Exercise tolerated well No report of cardiac concerns or symptoms Strength training completed today  Goals Unmet:  Not Applicable  Comments: Pt able to follow exercise prescription today without complaint.  Will continue to monitor for progression.    Dr. Emily Filbert is Medical Director for [place] and [facility].

## 2018-06-17 DIAGNOSIS — Z951 Presence of aortocoronary bypass graft: Secondary | ICD-10-CM

## 2018-06-17 NOTE — Progress Notes (Signed)
Daily Session Note  Patient Details  Name: Mark Mack MRN: [id number] Date of Birth: August 17, 1956 Referring Provider:     Cardiac Rehab from 05/28/2018 in [facility] Cardiac and Pulmonary Rehab  Referring Provider  Ikonimidis      Encounter Date: 06/17/2018  Check In: Session Check In - 06/17/18 [place]      Check-In   Supervising physician immediately available to respond to emergencies  See telemetry face sheet for immediately available ER MD    Location  [facility]-Cardiac & Pulmonary Rehab    Staff Present  Alberteen Sam, MA, RCEP, CCRP, Exercise Physiologist;Celestine Bougie Royal Piedra, RN BSN    Medication changes reported      No    Fall or balance concerns reported     No    Warm-up and Cool-down  Performed on first and last piece of equipment    Resistance Training Performed  Yes    VAD Patient?  No      Pain Assessment   Currently in Pain?  No/denies          Social History   Tobacco Use  Smoking Status Former Smoker  . Last attempt to quit: 03/23/2018  . Years since quitting: 0.2  Smokeless Tobacco Never Used    Goals Met:  Independence with exercise equipment Exercise tolerated well No report of cardiac concerns or symptoms Strength training completed today  Goals Unmet:  Not Applicable  Comments: Pt able to follow exercise prescription today without complaint.  Will continue to monitor for progression.   Dr. Emily Filbert is Medical Director for [place] and [facility].

## 2018-06-19 ENCOUNTER — Encounter: Payer: BLUE CROSS/BLUE SHIELD | Admitting: *Deleted

## 2018-06-19 DIAGNOSIS — Z951 Presence of aortocoronary bypass graft: Secondary | ICD-10-CM

## 2018-06-19 NOTE — Progress Notes (Signed)
Daily Session Note  Patient Details  Name: Mark Mack MRN: [id number] Date of Birth: [birth year] Referring Provider:     Cardiac Rehab from 05/28/2018 in [facility] Cardiac and Pulmonary Rehab  Referring Provider  Ikonimidis      Encounter Date: 06/19/2018  Check In: Session Check In - 06/19/18 0747      Check-In   Supervising physician immediately available to respond to emergencies  See telemetry face sheet for immediately available ER MD    Location  [facility]-Cardiac & Pulmonary Rehab    Staff Present  Renita Papa, RN BSN;Marsel Gail Luan Pulling, MA, RCEP, CCRP, Exercise Physiologist;Amanda Oletta Darter, [place], ACSM CEP, Exercise Physiologist    Medication changes reported      No    Fall or balance concerns reported     No    Warm-up and Cool-down  Performed on first and last piece of equipment    Resistance Training Performed  Yes    VAD Patient?  No    PAD/SET Patient?  No      Pain Assessment   Currently in Pain?  No/denies          Social History   Tobacco Use  Smoking Status Former Smoker  . Last attempt to quit: 03/23/2018  . Years since quitting: 0.2  Smokeless Tobacco Never Used    Goals Met:  Independence with exercise equipment Exercise tolerated well No report of cardiac concerns or symptoms Strength training completed today  Goals Unmet:  Not Applicable  Comments: Pt able to follow exercise prescription today without complaint.  Will continue to monitor for progression.    Dr. Emily Filbert is Medical Director for [place] and [facility].

## 2018-06-22 DIAGNOSIS — Z951 Presence of aortocoronary bypass graft: Secondary | ICD-10-CM | POA: Diagnosis not present

## 2018-06-22 NOTE — Progress Notes (Signed)
Daily Session Note  Patient Details  Name: Mark Mack MRN: [id number] Date of Birth: January 11, 1956 Referring Provider:     Cardiac Rehab from 05/28/2018 in [facility] Cardiac and Pulmonary Rehab  Referring Provider  Ikonimidis      Encounter Date: 06/22/2018  Check In: Session Check In - 06/22/18 1707      Check-In   Supervising physician immediately available to respond to emergencies  See telemetry face sheet for immediately available ER MD    Location  [facility]-Cardiac & Pulmonary Rehab    Staff Present  Gerlene Burdock, RN, Moises Blood, BS, ACSM CEP, Exercise Physiologist;Amanda Oletta Darter, [place], ACSM CEP, Exercise Physiologist    Medication changes reported      No    Fall or balance concerns reported     No    Warm-up and Cool-down  Performed on first and last piece of equipment    Resistance Training Performed  Yes    VAD Patient?  No    PAD/SET Patient?  No      Pain Assessment   Currently in Pain?  No/denies    Multiple Pain Sites  No          Social History   Tobacco Use  Smoking Status Former Smoker  . Last attempt to quit: 03/23/2018  . Years since quitting: 0.2  Smokeless Tobacco Never Used    Goals Met:  Independence with exercise equipment Exercise tolerated well No report of cardiac concerns or symptoms Strength training completed today  Goals Unmet:  Not Applicable  Comments: Pt able to follow exercise prescription today without complaint.  Will continue to monitor for progression.    Dr. Emily Filbert is Medical Director for [place] and [facility].

## 2018-06-24 DIAGNOSIS — Z951 Presence of aortocoronary bypass graft: Secondary | ICD-10-CM | POA: Diagnosis not present

## 2018-06-24 NOTE — Progress Notes (Signed)
Daily Session Note  Patient Details  Name: Mark Mack MRN: [id number] Date of Birth: July 25, 1956 Referring Provider:     Cardiac Rehab from 05/28/2018 in [facility] Cardiac and Pulmonary Rehab  Referring Provider  Ikonimidis      Encounter Date: 06/24/2018  Check In: Session Check In - 06/24/18 1647      Check-In   Supervising physician immediately available to respond to emergencies  See telemetry face sheet for immediately available ER MD    Location  [facility]-Cardiac & Pulmonary Rehab    Staff Present  Joellyn Rued, BS, PEC;Carroll Enterkin, RN, [place], BA, ACSM CEP, Exercise Physiologist    Medication changes reported      No    Fall or balance concerns reported     No    Tobacco Cessation  No Change    Warm-up and Cool-down  Performed on first and last piece of equipment    Resistance Training Performed  Yes    VAD Patient?  No    PAD/SET Patient?  No      Pain Assessment   Currently in Pain?  No/denies    Multiple Pain Sites  No          Social History   Tobacco Use  Smoking Status Former Smoker  . Last attempt to quit: 03/23/2018  . Years since quitting: 0.2  Smokeless Tobacco Never Used    Goals Met:  Independence with exercise equipment Exercise tolerated well No report of cardiac concerns or symptoms Strength training completed today  Goals Unmet:  Not Applicable  Comments: Pt able to follow exercise prescription today without complaint.  Will continue to monitor for progression.    Dr. Emily Filbert is Medical Director for [place] and [facility].

## 2018-06-25 DIAGNOSIS — Z951 Presence of aortocoronary bypass graft: Secondary | ICD-10-CM

## 2018-06-25 NOTE — Progress Notes (Signed)
Daily Session Note  Patient Details  Name: Mark Mack MRN: [id number] Date of Birth: [birth year] Referring Provider:     Cardiac Rehab from 05/28/2018 in [facility] Cardiac and Pulmonary Rehab  Referring Provider  Ikonimidis      Encounter Date: 06/25/2018  Check In:      Social History   Tobacco Use  Smoking Status Former Smoker  . Last attempt to quit: 03/23/2018  . Years since quitting: 0.2  Smokeless Tobacco Never Used    Goals Met:  Independence with exercise equipment Exercise tolerated well No report of cardiac concerns or symptoms Strength training completed today  Goals Unmet:  Not Applicable  Comments: Pt able to follow exercise prescription today without complaint.  Will continue to monitor for progression.   Dr. Emily Filbert is Medical Director for [place] and [facility].

## 2018-07-01 ENCOUNTER — Encounter: Payer: BLUE CROSS/BLUE SHIELD | Attending: Surgery

## 2018-07-01 DIAGNOSIS — Z951 Presence of aortocoronary bypass graft: Secondary | ICD-10-CM | POA: Insufficient documentation

## 2018-07-06 DIAGNOSIS — Z951 Presence of aortocoronary bypass graft: Secondary | ICD-10-CM | POA: Diagnosis not present

## 2018-07-06 NOTE — Progress Notes (Signed)
Daily Session Note  Patient Details  Name: Mark Mack MRN: [id number] Date of Birth: Oct 31, 1955 Referring Provider:     Cardiac Rehab from 05/28/2018 in [facility] Cardiac and Pulmonary Rehab  Referring Provider  Ikonimidis      Encounter Date: 07/06/2018  Check In: Session Check In - 07/06/18 1723      Check-In   Supervising physician immediately available to respond to emergencies  See telemetry face sheet for immediately available ER MD    Location  [facility]-Cardiac & Pulmonary Rehab    Staff Present  Nada Maclachlan, BA, ACSM CEP, Exercise Physiologist;Carroll Enterkin, RN, Moises Blood, BS, ACSM CEP, Exercise Physiologist    Medication changes reported      No    Fall or balance concerns reported     No    Warm-up and Cool-down  Performed on first and last piece of equipment    Resistance Training Performed  Yes    VAD Patient?  No    PAD/SET Patient?  No      Pain Assessment   Currently in Pain?  No/denies    Multiple Pain Sites  No          Social History   Tobacco Use  Smoking Status Former Smoker  . Last attempt to quit: 03/23/2018  . Years since quitting: 0.2  Smokeless Tobacco Never Used    Goals Met:  Independence with exercise equipment Exercise tolerated well No report of cardiac concerns or symptoms Strength training completed today  Goals Unmet:  Not Applicable  Comments: Pt able to follow exercise prescription today without complaint.  Will continue to monitor for progression.    Dr. Emily Filbert is Medical Director for [place] and [facility].

## 2018-07-08 ENCOUNTER — Encounter: Payer: Self-pay | Admitting: *Deleted

## 2018-07-08 DIAGNOSIS — Z951 Presence of aortocoronary bypass graft: Secondary | ICD-10-CM

## 2018-07-08 NOTE — Progress Notes (Signed)
Cardiac Individual Treatment Plan  Patient Details  Name: Mark Mack MRN: [id number] Date of Birth: 19-Feb-1956 Referring Provider:     Cardiac Rehab from 05/28/2018 in [facility] Cardiac and Pulmonary Rehab  Referring Provider  Ikonimidis      Initial Encounter Date:    Cardiac Rehab from 05/28/2018 in [facility] Cardiac and Pulmonary Rehab  Date  05/28/18      Visit Diagnosis: S/P CABG x 4  Patient's Home Medications on Admission:  Current Outpatient Medications:  .  acetaminophen (TYLENOL) 500 MG tablet, Take 1,000 mg by mouth every 6 (six) hours as needed. , Disp: , Rfl:  .  aspirin EC 81 MG tablet, Take 81 mg by mouth daily. , Disp: , Rfl:  .  docusate sodium (COLACE) 100 MG capsule, Take 1 capsule (100 mg total) by mouth daily. (Patient not taking: Reported on 05/28/2018), Disp: 10 capsule, Rfl: 0 .  esomeprazole (NEXIUM) 20 MG packet, Take 20 mg by mouth daily before breakfast. , Disp: , Rfl:  .  ezetimibe (ZETIA) 10 MG tablet, Take 10 mg by mouth daily., Disp: , Rfl: 2 .  ferrous sulfate 325 (65 FE) MG EC tablet, Take 1 tablet (325 mg total) by mouth daily with breakfast. (Patient not taking: Reported on 05/28/2018), Disp: 60 tablet, Rfl: 3 .  furosemide (LASIX) 20 MG tablet, Take 20 mg by mouth daily., Disp: , Rfl: 0 .  metoprolol succinate (TOPROL XL) 50 MG 24 hr tablet, Take 50 mg by mouth daily. , Disp: , Rfl:  .  ranitidine (ZANTAC) 150 MG tablet, , Disp: , Rfl:  .  rosuvastatin (CRESTOR) 20 MG tablet, Take 1 tablet (20 mg total) by mouth at bedtime. (Patient not taking: Reported on 05/28/2018), Disp: 30 tablet, Rfl: 0 .  senna-docusate (SENOKOT-S) 8.6-50 MG tablet, Take 1 tablet by mouth at bedtime as needed for mild constipation. (Patient not taking: Reported on 05/28/2018), Disp: , Rfl:   Past Medical History: Past Medical History:  Diagnosis Date  . Hypertension     Tobacco Use: Social History   Tobacco Use  Smoking Status Former Smoker  . Last attempt to quit: 03/23/2018  . Years  since quitting: 0.2  Smokeless Tobacco Never Used    Labs: Recent Review Flowsheet Data    There is no flowsheet data to display.       Exercise Target Goals: Exercise Program Goal: Individual exercise prescription set using results from initial 6 min walk test and THRR while considering  patient's activity barriers and safety.   Exercise Prescription Goal: Initial exercise prescription builds to 30-45 minutes a day of aerobic activity, 2-3 days per week.  Home exercise guidelines will be given to patient during program as part of exercise prescription that the participant will acknowledge.  Activity Barriers & Risk Stratification: Activity Barriers & Cardiac Risk Stratification - 05/28/18 1413      Activity Barriers & Cardiac Risk Stratification   Activity Barriers  Arthritis;Right Hip Replacement;Joint Problems   Needs Left Hip Replacement   Cardiac Risk Stratification  High       6 Minute Walk: 6 Minute Walk    Row Name 05/28/18 1419         6 Minute Walk   Distance  1414 feet     Walk Time  6 minutes     # of Rest Breaks  0     MPH  2.68     METS  4.43     RPE  11  Perceived Dyspnea   0     VO2 Peak  15.5     Symptoms  No     Resting HR  67 bpm     Resting BP  122/64     Resting Oxygen Saturation   98 %     Exercise Oxygen Saturation  during 6 min walk  99 %     Max Ex. HR  130 bpm     Max Ex. BP  160/70     2 Minute Post BP  120/70        Oxygen Initial Assessment:   Oxygen Re-Evaluation:   Oxygen Discharge (Final Oxygen Re-Evaluation):   Initial Exercise Prescription: Initial Exercise Prescription - 05/28/18 1400      Date of Initial Exercise RX and Referring Provider   Date  05/28/18    Referring Provider  Ikonimidis      Treadmill   MPH  2.7    Grade  3    Minutes  15    METs  4.2      Recumbant Bike   Level  6    RPM  60    Watts  50    Minutes  15    METs  4.4      REL-XR   Level  4    Speed  50    Minutes  15    METs   4.4      Prescription Details   Frequency (times per week)  3    Duration  Progress to 45 minutes of aerobic exercise without signs/symptoms of physical distress      Intensity   THRR 40-80% of Max Heartrate  104-141    Ratings of Perceived Exertion  11-13    Perceived Dyspnea  0-4      Resistance Training   Training Prescription  Yes    Weight  3 lb    Reps  10-15       Perform Capillary Blood Glucose checks as needed.  Exercise Prescription Changes: Exercise Prescription Changes    Row Name 05/28/18 1400 06/02/18 1600 06/12/18 0800 06/15/18 1100 07/02/18 0900     Response to Exercise   Blood Pressure (Admit)  122/64  134/70  -  128/60  122/80   Blood Pressure (Exercise)  160/70  200/90 180/80  -  178/86  140/70   Blood Pressure (Exit)  120/70  112/66  -  124/72  114/66   Heart Rate (Admit)  62 bpm  67 bpm  -  80 bpm  82 bpm   Heart Rate (Exercise)  130 bpm  119 bpm  -  126 bpm  109 bpm   Heart Rate (Exit)  60 bpm  85 bpm  -  81 bpm  71 bpm   Oxygen Saturation (Admit)  98 %  -  -  -  -   Oxygen Saturation (Exercise)  99 %  -  -  -  -   Rating of Perceived Exertion (Exercise)  11  13  -  12  12   Symptoms  -  none  -  none  none   Comments  -  first full day of exercise  -  -  -   Duration  -  Progress to 45 minutes of aerobic exercise without signs/symptoms of physical distress  -  Continue with 45 min of aerobic exercise without signs/symptoms of physical distress.  Continue with 45 min of aerobic exercise without signs/symptoms  of physical distress.   Intensity  -  THRR unchanged  -  THRR unchanged  THRR unchanged     Progression   Progression  -  Continue to progress workloads to maintain intensity without signs/symptoms of physical distress.  -  Continue to progress workloads to maintain intensity without signs/symptoms of physical distress.  Continue to progress workloads to maintain intensity without signs/symptoms of physical distress.   Average METs  -  4.26  -   3.97  4.3     Resistance Training   Training Prescription  -  Yes  -  Yes  Yes   Weight  -  4 lbs  -  5 lbs  5 lb   Reps  -  10-15  -  10-15  10-15     Interval Training   Interval Training  -  No  -  No  No     Treadmill   MPH  -  2  -  2.7  3   Grade  -  5  -  1  1   Minutes  -  15  -  15  15   METs  -  3.91  -  3.44  3.71     Recumbant Bike   Level  -  -  -  6  7   Watts  -  -  -  36  50   Minutes  -  -  -  15  15   METs  -  -  -  3.69  4.18     REL-XR   Level  -  4  -  4  -   Minutes  -  15  -  15  -   METs  -  4.6  -  4.8  -     Home Exercise Plan   Plans to continue exercise at  -  -  Home (comment) walking  Home (comment) walking  Home (comment) walking   Frequency  -  -  Add 2 additional days to program exercise sessions.  Add 2 additional days to program exercise sessions.  Add 2 additional days to program exercise sessions.   Initial Home Exercises Provided  -  -  06/12/18  06/12/18  06/12/18      Exercise Comments: Exercise Comments    Row Name 06/01/18 0809           Exercise Comments   First full day of exercise!  Patient was oriented to gym and equipment including functions, settings, policies, and procedures.  Patient's individual exercise prescription and treatment plan were reviewed.  All starting workloads were established based on the results of the 6 minute walk test done at initial orientation visit.  The plan for exercise progression was also introduced and progression will be customized based on patient's performance and goals.          Exercise Goals and Review: Exercise Goals    Row Name 05/28/18 1419             Exercise Goals   Increase Physical Activity  Yes       Intervention  Provide advice, education, support and counseling about physical activity/exercise needs.;Develop an individualized exercise prescription for aerobic and resistive training based on initial evaluation findings, risk stratification, comorbidities and  participant's personal goals.       Expected Outcomes  Short Term: Attend rehab on a regular basis to increase amount of physical activity.;Long Term:  Add in home exercise to make exercise part of routine and to increase amount of physical activity.;Long Term: Exercising regularly at least 3-5 days a week.       Increase Strength and Stamina  Yes       Intervention  Provide advice, education, support and counseling about physical activity/exercise needs.;Develop an individualized exercise prescription for aerobic and resistive training based on initial evaluation findings, risk stratification, comorbidities and participant's personal goals.       Expected Outcomes  Short Term: Increase workloads from initial exercise prescription for resistance, speed, and METs.;Short Term: Perform resistance training exercises routinely during rehab and add in resistance training at home;Long Term: Improve cardiorespiratory fitness, muscular endurance and strength as measured by increased METs and functional capacity (6MWT)       Able to understand and use rate of perceived exertion (RPE) scale  Yes       Intervention  Provide education and explanation on how to use RPE scale       Expected Outcomes  Short Term: Able to use RPE daily in rehab to express subjective intensity level;Long Term:  Able to use RPE to guide intensity level when exercising independently       Able to understand and use Dyspnea scale  Yes       Intervention  Provide education and explanation on how to use Dyspnea scale       Expected Outcomes  Short Term: Able to use Dyspnea scale daily in rehab to express subjective sense of shortness of breath during exertion;Long Term: Able to use Dyspnea scale to guide intensity level when exercising independently       Knowledge and understanding of Target Heart Rate Range (THRR)  Yes       Intervention  Provide education and explanation of THRR including how the numbers were predicted and where they are  located for reference       Expected Outcomes  Short Term: Able to state/look up THRR;Short Term: Able to use daily as guideline for intensity in rehab;Long Term: Able to use THRR to govern intensity when exercising independently       Able to check pulse independently  Yes       Intervention  Provide education and demonstration on how to check pulse in carotid and radial arteries.;Review the importance of being able to check your own pulse for safety during independent exercise       Expected Outcomes  Short Term: Able to explain why pulse checking is important during independent exercise;Long Term: Able to check pulse independently and accurately       Understanding of Exercise Prescription  Yes       Intervention  Provide education, explanation, and written materials on patient's individual exercise prescription       Expected Outcomes  Short Term: Able to explain program exercise prescription;Long Term: Able to explain home exercise prescription to exercise independently          Exercise Goals Re-Evaluation : Exercise Goals Re-Evaluation    Row Name 06/01/18 0809 06/12/18 0836 06/15/18 1150 07/02/18 0948       Exercise Goal Re-Evaluation   Exercise Goals Review  Increase Physical Activity;Increase Strength and Stamina;Able to understand and use rate of perceived exertion (RPE) scale;Able to check pulse independently;Knowledge and understanding of Target Heart Rate Range (THRR);Understanding of Exercise Prescription  Increase Physical Activity;Able to understand and use rate of perceived exertion (RPE) scale;Knowledge and understanding of Target Heart Rate Range (THRR);Understanding of Exercise Prescription;Increase  Strength and Stamina;Able to understand and use Dyspnea scale;Able to check pulse independently  Increase Physical Activity;Increase Strength and Stamina;Understanding of Exercise Prescription  Increase Physical Activity;Increase Strength and Stamina;Able to understand and use rate  of perceived exertion (RPE) scale;Knowledge and understanding of Target Heart Rate Range (THRR)    Comments  Reviewed RPE scale, THR and program prescription with pt today.  Pt voiced understanding and was given a copy of goals to take home.   Reviewed home exercise with pt today.  Pt plans to walk at home and at track at [facility] for exercise.  Reviewed THR, pulse, RPE, sign and symptoms, NTG use, and when to call 911 or MD.  Also discussed weather considerations and indoor options.  Pt voiced understanding.  Jeren is off to a good start in rehab.  He is already up to 2.7 mph on the treadmill.  We will continue to monitor his progress.   Jerre is making steady progress and has increased MET level.  Staff will discuss interval training with Elberta Fortis.    Expected Outcomes  Short: Use RPE daily to regulate intensity. Long: Follow program prescription in THR.  Short: Start to walk on his off days.  Long: Continue to exercise independently  Short: Continue to work on increasing workloads. Long: Continue to exercise on off days.   Short - add interval training Long - increase overall MET level       Discharge Exercise Prescription (Final Exercise Prescription Changes): Exercise Prescription Changes - 07/02/18 0900      Response to Exercise   Blood Pressure (Admit)  122/80    Blood Pressure (Exercise)  140/70    Blood Pressure (Exit)  114/66    Heart Rate (Admit)  82 bpm    Heart Rate (Exercise)  109 bpm    Heart Rate (Exit)  71 bpm    Rating of Perceived Exertion (Exercise)  12    Symptoms  none    Duration  Continue with 45 min of aerobic exercise without signs/symptoms of physical distress.    Intensity  THRR unchanged      Progression   Progression  Continue to progress workloads to maintain intensity without signs/symptoms of physical distress.    Average METs  4.3      Resistance Training   Training Prescription  Yes    Weight  5 lb    Reps  10-15      Interval Training   Interval  Training  No      Treadmill   MPH  3    Grade  1    Minutes  15    METs  3.71      Recumbant Bike   Level  7    Watts  50    Minutes  15    METs  4.18      Home Exercise Plan   Plans to continue exercise at  Home (comment)   walking   Frequency  Add 2 additional days to program exercise sessions.    Initial Home Exercises Provided  06/12/18       Nutrition:  Target Goals: Understanding of nutrition guidelines, daily intake of sodium <15107m, cholesterol <2054m calories 30% from fat and 7% or less from saturated fats, daily to have 5 or more servings of fruits and vegetables.  Biometrics: Pre Biometrics - 05/28/18 1417      Pre Biometrics   Height  _0  (1.676 m)    Weight  144 lb (  65.3 kg)    Waist Circumference  30.5 inches    Hip Circumference  37.5 inches    Waist to Hip Ratio  0.81 %    BMI (Calculated)  23.25    Single Leg Stand  14.5 seconds        Nutrition Therapy Plan and Nutrition Goals: Nutrition Therapy & Goals - 06/08/18 0954      Nutrition Therapy   Diet  TLC    Protein (specify units)  10oz    Fiber  30 grams    Whole Grain Foods  3 servings   does not always choose whole grains   Saturated Fats  16 max. grams    Fruits and Vegetables  5 servings/day   8 ideal, does not eat fruit regularly   Sodium  2000 grams      Personal Nutrition Goals   Nutrition Goal  Include fruits as snacks between meals to both increase daily intake of fruits and to choose more nutritious snack options    Personal Goal #2  Work to decrease sugar sweetened beverage consumption; for example, choose PowerAde Zero rather than regular PowerAde    Personal Goal #3  Choose low-sugar cereal options. A low-sugar option will have 10g of sugar or less per serving    Personal Goal #4  Practice reading nutrition facts labels to identify foods high and low in sodium    Comments  He currently chooses processed and high sugar snack options, consumes sugar sweetened beverages IE  sweet tea and soda daily, chooses some fried foods, and does not eat fruits other than bananas regularly.      Intervention Plan   Intervention  Prescribe, educate and counsel regarding individualized specific dietary modifications aiming towards targeted core components such as weight, hypertension, lipid management, diabetes, heart failure and other comorbidities.;Nutrition handout(s) given to patient.   low sodium nutrition therapy   Expected Outcomes  Short Term Goal: Understand basic principles of dietary content, such as calories, fat, sodium, cholesterol and nutrients.;Short Term Goal: A plan has been developed with personal nutrition goals set during dietitian appointment.;Long Term Goal: Adherence to prescribed nutrition plan.       Nutrition Assessments: Nutrition Assessments - 05/28/18 1359      MEDFICTS Scores   Pre Score  45       Nutrition Goals Re-Evaluation: Nutrition Goals Re-Evaluation    [place] Name 06/08/18 1005             Goals   Nutrition Goal  Include fruits as snacks between meals to both increase daily intake of fruits and to choose more nutritious snack options       Comment  He does not consume fruits regularly other than bananas and chooses snacks like chips, little debby's, chocolate covered peanuts, and oatmeal cream pies       Expected Outcome  He will choose fruits as snacks more regularly, and also incorporate other snacks with greater nutritional value such as [employer] or trail mix         Personal Goal #2 Re-Evaluation   Personal Goal #2  Work to decrease sugar sweetened beverage consumption; for example choose PowerAde Zero rather than regular PoweraAde         Personal Goal #3 Re-Evaluation   Personal Goal #3  Choose low-sugar cereal options. A low sugar option will have 10g of sugar or less per serving         Personal Goal #4 Re-Evaluation   Personal  Goal #4  Practice reading nutrition facts labels to identify foods high and low in  sodium          Nutrition Goals Discharge (Final Nutrition Goals Re-Evaluation): Nutrition Goals Re-Evaluation - 06/08/18 1005      Goals   Nutrition Goal  Include fruits as snacks between meals to both increase daily intake of fruits and to choose more nutritious snack options    Comment  He does not consume fruits regularly other than bananas and chooses snacks like chips, little debby's, chocolate covered peanuts, and oatmeal cream pies    Expected Outcome  He will choose fruits as snacks more regularly, and also incorporate other snacks with greater nutritional value such as [employer] or trail mix      Personal Goal #2 Re-Evaluation   Personal Goal #2  Work to decrease sugar sweetened beverage consumption; for example choose PowerAde Zero rather than regular PoweraAde      Personal Goal #3 Re-Evaluation   Personal Goal #3  Choose low-sugar cereal options. A low sugar option will have 10g of sugar or less per serving      Personal Goal #4 Re-Evaluation   Personal Goal #4  Practice reading nutrition facts labels to identify foods high and low in sodium       Psychosocial: Target Goals: Acknowledge presence or absence of significant depression and/or stress, maximize coping skills, provide positive support system. Participant is able to verbalize types and ability to use techniques and skills needed for reducing stress and depression.   Initial Review & Psychosocial Screening: Initial Psych Review & Screening - 05/28/18 1359      Initial Review   Current issues with  Current Stress Concerns    Comments  Returning to work the end of August. Works painting car parts in the heat, long hours. He knows he needs to take it easy returning to work. He is ready to get back to his normal routine.       Family Dynamics   Good Support System?  Yes   Wife, family     Barriers   Psychosocial barriers to participate in program  There are no identifiable barriers or psychosocial  needs.;The patient should benefit from training in stress management and relaxation.      Screening Interventions   Interventions  Program counselor consult;To provide support and resources with identified psychosocial needs;Encouraged to exercise;Provide feedback about the scores to participant    Expected Outcomes  Short Term goal: Utilizing psychosocial counselor, staff and physician to assist with identification of specific Stressors or current issues interfering with healing process. Setting desired goal for each stressor or current issue identified.;Long Term Goal: Stressors or current issues are controlled or eliminated.;Short Term goal: Identification and review with participant of any Quality of Life or Depression concerns found by scoring the questionnaire.;Long Term goal: The participant improves quality of Life and PHQ9 Scores as seen by post scores and/or verbalization of changes       Quality of Life Scores:  Quality of Life - 05/28/18 1407      Quality of Life   Select  Quality of Life      Quality of Life Scores   Health/Function Pre  25.73 %    Socioeconomic Pre  21.88 %    Psych/Spiritual Pre  27.93 %    Family Pre  24 %    GLOBAL Pre  25.04 %      Scores of 19 and below usually indicate  a poorer quality of life in these areas.  A difference of  2-3 points is a clinically meaningful difference.  A difference of 2-3 points in the total score of the Quality of Life Index has been associated with significant improvement in overall quality of life, self-image, physical symptoms, and general health in studies assessing change in quality of life.  PHQ-9: Recent Review Flowsheet Data    Depression screen [facility] 2/9 05/28/2018   Decreased Interest 2   Down, Depressed, Hopeless 0   PHQ - 2 Score 2   Altered sleeping 0   Tired, decreased energy 0   Change in appetite 1   Feeling bad or failure about yourself  0   Trouble concentrating 0   Moving slowly or fidgety/restless 0    Suicidal thoughts 0   PHQ-9 Score 3   Difficult doing work/chores Not difficult at all     Interpretation of Total Score  Total Score Depression Severity:  1-4 = Minimal depression, 5-9 = Mild depression, 10-14 = Moderate depression, 15-19 = Moderately severe depression, 20-27 = Severe depression   Psychosocial Evaluation and Intervention: Psychosocial Evaluation - 06/10/18 0911      Psychosocial Evaluation & Interventions   Interventions  Relaxation education;Stress management education;Encouraged to exercise with the program and follow exercise prescription    Comments  Counselor met with Mr. Canterbury Adrian) today for initial psychosocial evaluation.  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  Catlin has a strong support system with a sposue of 29 years; an adult daughter and son who live locally and active involvement in his local church.  He reports being in fairly good health other than his heart with the exception of the need for hip replacement in the future.  Lott states he sleeps well and has a good appetite.  He denies a history of depression or anxiety or any current symptoms and he is typically in a positive mood.  Ankur says his primary stressors are being out of work currently and his health.  He will return to work on 8/26.  His goals for this program are to lose weight; get back to work; and continue to increase his stamina and energy.  Staff will follow with Elberta Fortis.     Expected Outcomes  Short:  Torien will continue exercising for his health and stress management.  He will attend the psychoeducational components of this program to learn more strategies to manage stress and set healthier limits on his work hours.  Long:  Daysean will continue a routine of exercise and positive self-care upon completion of this program.      Continue Psychosocial Services   Follow up required by staff       Psychosocial Re-Evaluation: Psychosocial Re-Evaluation    [place] Name  06/19/18 [phone]             Psychosocial Re-Evaluation   Current issues with  Current Stress Concerns       Comments  Emilian goes back to work next Monday.  He has some stress at home that is normal.  He manages stress by exercising and he is working on breathing techniques.        Expected Outcomes  Short - continue to attend HT Long - manage stress with exrecise and breathing       Interventions  Encouraged to attend Cardiac Rehabilitation for the exercise;Stress management education;Relaxation education       Continue Psychosocial Services   Follow  up required by staff          Psychosocial Discharge (Final Psychosocial Re-Evaluation): Psychosocial Re-Evaluation - 06/19/18 9735      Psychosocial Re-Evaluation   Current issues with  Current Stress Concerns    Comments  Kilan goes back to work next Monday.  He has some stress at home that is normal.  He manages stress by exercising and he is working on breathing techniques.     Expected Outcomes  Short - continue to attend HT Long - manage stress with exrecise and breathing    Interventions  Encouraged to attend Cardiac Rehabilitation for the exercise;Stress management education;Relaxation education    Continue Psychosocial Services   Follow up required by staff       Vocational Rehabilitation: Provide vocational rehab assistance to qualifying candidates.   Vocational Rehab Evaluation & Intervention: Vocational Rehab - 05/28/18 1412      Initial Vocational Rehab Evaluation & Intervention   Assessment shows need for Vocational Rehabilitation  No       Education: Education Goals: Education classes will be provided on a variety of topics geared toward better understanding of heart health and risk factor modification. Participant will state understanding/return demonstration of topics presented as noted by education test scores.  Learning Barriers/Preferences: Learning Barriers/Preferences - 05/28/18 1410      Learning  Barriers/Preferences   Learning Barriers  Exercise Concerns    Learning Preferences  Computer/Internet;Verbal Instruction;Group Instruction;Skilled Demonstration;Video       Education Topics:  AED/CPR: - Group verbal and written instruction with the use of models to demonstrate the basic use of the AED with the basic ABC's of resuscitation.   General Nutrition Guidelines/Fats and Fiber: -Group instruction provided by verbal, written material, models and posters to present the general guidelines for heart healthy nutrition. Gives an explanation and review of dietary fats and fiber.   Cardiac Rehab from 07/06/2018 in [facility] Cardiac and Pulmonary Rehab  Date  07/06/18  Educator  LB  Instruction Review Code  1- Verbalizes Understanding      Controlling Sodium/Reading Food Labels: -Group verbal and written material supporting the discussion of sodium use in heart healthy nutrition. Review and explanation with models, verbal and written materials for utilization of the food label.   Exercise Physiology & General Exercise Guidelines: - Group verbal and written instruction with models to review the exercise physiology of the cardiovascular system and associated critical values. Provides general exercise guidelines with specific guidelines to those with heart or lung disease.    Aerobic Exercise & Resistance Training: - Gives group verbal and written instruction on the various components of exercise. Focuses on aerobic and resistive training programs and the benefits of this training and how to safely progress through these programs..   Cardiac Rehab from 07/06/2018 in [facility] Cardiac and Pulmonary Rehab  Date  06/01/18  Educator  [facility]  Instruction Review Code  1- Verbalizes Understanding      Flexibility, Balance, Mind/Body Relaxation: Provides group verbal/written instruction on the benefits of flexibility and balance training, including mind/body exercise modes such as yoga, pilates and tai  chi.  Demonstration and skill practice provided.   Cardiac Rehab from 07/06/2018 in [facility] Cardiac and Pulmonary Rehab  Date  06/08/18  Educator  [facility]  Instruction Review Code  1- Verbalizes Understanding      Stress and Anxiety: - Provides group verbal and written instruction about the health risks of elevated stress and causes of high stress.  Discuss the correlation between heart/lung  disease and anxiety and treatment options. Review healthy ways to manage with stress and anxiety.   Cardiac Rehab from 07/06/2018 in [facility] Cardiac and Pulmonary Rehab  Date  06/17/18  Educator  [facility]  Instruction Review Code  1- Verbalizes Understanding      Depression: - Provides group verbal and written instruction on the correlation between heart/lung disease and depressed mood, treatment options, and the stigmas associated with seeking treatment.   Cardiac Rehab from 07/06/2018 in [facility] Cardiac and Pulmonary Rehab  Date  06/03/18  Educator  [facility]  Instruction Review Code  1- Verbalizes Understanding      Anatomy & Physiology of the Heart: - Group verbal and written instruction and models provide basic cardiac anatomy and physiology, with the coronary electrical and arterial systems. Review of Valvular disease and Heart Failure   Cardiac Rehab from 07/06/2018 in [facility] Cardiac and Pulmonary Rehab  Date  06/15/18  Educator  CE  Instruction Review Code  1- Verbalizes Understanding      Cardiac Procedures: - Group verbal and written instruction to review commonly prescribed medications for heart disease. Reviews the medication, class of the drug, and side effects. Includes the steps to properly store meds and maintain the prescription regimen. (beta blockers and nitrates)   Cardiac Medications I: - Group verbal and written instruction to review commonly prescribed medications for heart disease. Reviews the medication, class of the drug, and side effects. Includes the steps to properly store meds and maintain the  prescription regimen.   Cardiac Rehab from 07/06/2018 in [facility] Cardiac and Pulmonary Rehab  Date  06/22/18  Educator  CE  Instruction Review Code  1- Verbalizes Understanding      Cardiac Medications II: -Group verbal and written instruction to review commonly prescribed medications for heart disease. Reviews the medication, class of the drug, and side effects. (all other drug classes)   Cardiac Rehab from 07/06/2018 in [facility] Cardiac and Pulmonary Rehab  Date  06/10/18  Educator  [place]  Instruction Review Code  1- Verbalizes Understanding       Go Sex-Intimacy & Heart Disease, Get SMART - Goal Setting: - Group verbal and written instruction through game format to discuss heart disease and the return to sexual intimacy. Provides group verbal and written material to discuss and apply goal setting through the application of the S.M.A.R.T. Method.   Other Matters of the Heart: - Provides group verbal, written materials and models to describe Stable Angina and Peripheral Artery. Includes description of the disease process and treatment options available to the cardiac patient.   Cardiac Rehab from 07/06/2018 in [facility] Cardiac and Pulmonary Rehab  Date  06/15/18  Educator  CE  Instruction Review Code  1- Verbalizes Understanding      Exercise & Equipment Safety: - Individual verbal instruction and demonstration of equipment use and safety with use of the equipment.   Cardiac Rehab from 07/06/2018 in [facility] Cardiac and Pulmonary Rehab  Date  05/28/18  Educator  [facility]  Instruction Review Code  1- Verbalizes Understanding      Infection Prevention: - Provides verbal and written material to individual with discussion of infection control including proper hand washing and proper equipment cleaning during exercise session.   Cardiac Rehab from 07/06/2018 in [facility] Cardiac and Pulmonary Rehab  Date  05/28/18  Educator  [facility]  Instruction Review Code  1- Verbalizes Understanding      Falls Prevention: -  Provides verbal and written material to individual with discussion of falls prevention  and safety.   Cardiac Rehab from 07/06/2018 in [facility] Cardiac and Pulmonary Rehab  Date  05/28/18  Educator  [facility]  Instruction Review Code  1- Verbalizes Understanding      Diabetes: - Individual verbal and written instruction to review signs/symptoms of diabetes, desired ranges of glucose level fasting, after meals and with exercise. Acknowledge that pre and post exercise glucose checks will be done for 3 sessions at entry of program.   Know Your Numbers and Risk Factors: -Group verbal and written instruction about important numbers in your health.  Discussion of what are risk factors and how they play a role in the disease process.  Review of Cholesterol, Blood Pressure, Diabetes, and BMI and the role they play in your overall health.   Cardiac Rehab from 07/06/2018 in [facility] Cardiac and Pulmonary Rehab  Date  06/10/18  Educator  [place]  Instruction Review Code  1- Verbalizes Understanding      Sleep Hygiene: -Provides group verbal and written instruction about how sleep can affect your health.  Define sleep hygiene, discuss sleep cycles and impact of sleep habits. Review good sleep hygiene tips.    Other: -Provides group and verbal instruction on various topics (see comments)   Knowledge Questionnaire Score: Knowledge Questionnaire Score - 05/28/18 1411      Knowledge Questionnaire Score   Pre Score  19/26   correct answers reviewed with Elberta Fortis, focus on exercise and nutrition      Core Components/Risk Factors/Patient Goals at Admission: Personal Goals and Risk Factors at Admission - 05/28/18 1356      Core Components/Risk Factors/Patient Goals on Admission    Weight Management  Yes;Weight Gain    Intervention  Weight Management: Develop a combined nutrition and exercise program designed to reach desired caloric intake, while maintaining appropriate intake of nutrient and fiber, sodium and fats,  and appropriate energy expenditure required for the weight goal.;Weight Management: Provide education and appropriate resources to help participant work on and attain dietary goals.    Admit Weight  144 lb (65.3 kg)    Goal Weight: Short Term  148 lb (67.1 kg)    Goal Weight: Long Term  150 lb (68 kg)    Expected Outcomes  Short Term: Continue to assess and modify interventions until short term weight is achieved;Long Term: Adherence to nutrition and physical activity/exercise program aimed toward attainment of established weight goal;Weight Maintenance: Understanding of the daily nutrition guidelines, which includes 25-35% calories from fat, 7% or less cal from saturated fats, less than 241m cholesterol, less than 1.5gm of sodium, & 5 or more servings of fruits and vegetables daily;Understanding recommendations for meals to include 15-35% energy as protein, 25-35% energy from fat, 35-60% energy from carbohydrates, less than 2029mof dietary cholesterol, 20-35 gm of total fiber daily;Understanding of distribution of calorie intake throughout the day with the consumption of 4-5 meals/snacks;Weight Gain: Understanding of general recommendations for a high calorie, high protein meal plan that promotes weight gain by distributing calorie intake throughout the day with the consumption for 4-5 meals, snacks, and/or supplements    Tobacco Cessation  Yes   quit 5/24   Intervention  Assist the participant in steps to quit. Provide individualized education and counseling about committing to Tobacco Cessation, relapse prevention, and pharmacological support that can be provided by physician.;OfAdvice workerassist with locating and accessing local/national Quit Smoking programs, and support quit date choice.    Expected Outcomes  Short Term: Will demonstrate readiness to quit, by selecting  a quit date.;Long Term: Complete abstinence from all tobacco products for at least 12 months from quit date.;Short  Term: Will quit all tobacco product use, adhering to prevention of relapse plan.    Hypertension  Yes    Intervention  Provide education on lifestyle modifcations including regular physical activity/exercise, weight management, moderate sodium restriction and increased consumption of fresh fruit, vegetables, and low fat dairy, alcohol moderation, and smoking cessation.;Monitor prescription use compliance.    Expected Outcomes  Long Term: Maintenance of blood pressure at goal levels.;Short Term: Continued assessment and intervention until BP is < 140/65m HG in hypertensive participants. < 130/813mHG in hypertensive participants with diabetes, heart failure or chronic kidney disease.    Lipids  Yes    Intervention  Provide education and support for participant on nutrition & aerobic/resistive exercise along with prescribed medications to achieve LDL <7057mHDL >27m57m  Expected Outcomes  Short Term: Participant states understanding of desired cholesterol values and is compliant with medications prescribed. Participant is following exercise prescription and nutrition guidelines.;Long Term: Cholesterol controlled with medications as prescribed, with individualized exercise RX and with personalized nutrition plan. Value goals: LDL < 70mg19mL > 40 mg.       Core Components/Risk Factors/Patient Goals Review:  Goals and Risk Factor Review    Row Name 06/19/18 0826             Core Components/Risk Factors/Patient Goals Review   Personal Goals Review  Weight Management/Obesity;Lipids;Hypertension       Review  AnthoLamariuswith the RD and is making healthier choices by reducing fat and sodium.  He takes meds as directed.  BP has been in normal limits during HT.  He is eating more fruits and vegetables.  he feels he has better stamina, strength and better mood since starting HT.       Expected Outcomes  Short - Continue to attend and make healthy changes to diet  Long - maintain exercise and dietary  habits          Core Components/Risk Factors/Patient Goals at Discharge (Final Review):  Goals and Risk Factor Review - 06/19/18 0826      Core Components/Risk Factors/Patient Goals Review   Personal Goals Review  Weight Management/Obesity;Lipids;Hypertension    Review  AnthoKentraviouswith the RD and is making healthier choices by reducing fat and sodium.  He takes meds as directed.  BP has been in normal limits during HT.  He is eating more fruits and vegetables.  he feels he has better stamina, strength and better mood since starting HT.    Expected Outcomes  Short - Continue to attend and make healthy changes to diet  Long - maintain exercise and dietary habits       ITP Comments: ITP Comments    Row Name 05/28/18 1336 06/10/18 0757 07/08/18 0555       ITP Comments  Med Review completed. Initial ITP created. [facility]  30 day review completed. ITP sent to Dr. Bert Ramonita Labering for Dr. Mark Emily Filbertical Director of Cardiac Rehab. Continue with ITP unless changes are made by physician.  New to program  30 day review completed. ITP sent to Dr. Mark Emily Filbertical Director of Cardiac Rehab. Continue with ITP unless changes are made by physician        Comments:

## 2018-07-13 DIAGNOSIS — Z951 Presence of aortocoronary bypass graft: Secondary | ICD-10-CM

## 2018-07-13 NOTE — Progress Notes (Signed)
Daily Session Note  Patient Details  Name: Mark Mack MRN: [id number] Date of Birth: 1956/07/10 Referring Provider:     Cardiac Rehab from 05/28/2018 in [facility] Cardiac and Pulmonary Rehab  Referring Provider  Ikonimidis      Encounter Date: 07/13/2018  Check In: Session Check In - 07/13/18 1747      Check-In   Supervising physician immediately available to respond to emergencies  See telemetry face sheet for immediately available ER MD    Location  [facility]-Cardiac & Pulmonary Rehab    Staff Present  Gerlene Burdock, RN, Moises Blood, BS, ACSM CEP, Exercise Physiologist;Amanda Oletta Darter, [place], ACSM CEP, Exercise Physiologist    Medication changes reported      No    Fall or balance concerns reported     No    Warm-up and Cool-down  Performed on first and last piece of equipment    Resistance Training Performed  Yes    VAD Patient?  No    PAD/SET Patient?  No      Pain Assessment   Currently in Pain?  No/denies    Multiple Pain Sites  No          Social History   Tobacco Use  Smoking Status Former Smoker  . Last attempt to quit: 03/23/2018  . Years since quitting: 0.3  Smokeless Tobacco Never Used    Goals Met:  Independence with exercise equipment Exercise tolerated well No report of cardiac concerns or symptoms Strength training completed today  Goals Unmet:  Not Applicable  Comments: Pt able to follow exercise prescription today without complaint.  Will continue to monitor for progression.    Dr. Emily Filbert is Medical Director for [place] and [facility].

## 2018-07-15 ENCOUNTER — Encounter: Payer: BLUE CROSS/BLUE SHIELD | Admitting: *Deleted

## 2018-07-15 DIAGNOSIS — Z951 Presence of aortocoronary bypass graft: Secondary | ICD-10-CM

## 2018-07-15 NOTE — Progress Notes (Signed)
Daily Session Note  Patient Details  Name: Mark Mack MRN: [id number] Date of Birth: 1956/08/15 Referring Provider:     Cardiac Rehab from 05/28/2018 in [facility] Cardiac and Pulmonary Rehab  Referring Provider  Ikonimidis      Encounter Date: 07/15/2018  Check In: Session Check In - 07/15/18 1702      Check-In   Supervising physician immediately available to respond to emergencies  See telemetry face sheet for immediately available ER MD    Location  [facility]-Cardiac & Pulmonary Rehab    Staff Present  Renita Papa, RN Vickki Hearing, BA, ACSM CEP, Exercise Physiologist;Carroll Enterkin, RN, BSN    Medication changes reported      No    Fall or balance concerns reported     No    Warm-up and Cool-down  Performed on first and last piece of equipment    Resistance Training Performed  Yes    VAD Patient?  No    PAD/SET Patient?  No      Pain Assessment   Currently in Pain?  No/denies          Social History   Tobacco Use  Smoking Status Former Smoker  . Last attempt to quit: 03/23/2018  . Years since quitting: 0.3  Smokeless Tobacco Never Used    Goals Met:  Independence with exercise equipment Exercise tolerated well No report of cardiac concerns or symptoms Strength training completed today  Goals Unmet:  Not Applicable  Comments: Pt able to follow exercise prescription today without complaint.  Will continue to monitor for progression.    Dr. Emily Filbert is Medical Director for [place] and [facility].

## 2018-07-16 ENCOUNTER — Encounter: Payer: BLUE CROSS/BLUE SHIELD | Admitting: *Deleted

## 2018-07-16 DIAGNOSIS — Z951 Presence of aortocoronary bypass graft: Secondary | ICD-10-CM | POA: Diagnosis not present

## 2018-07-16 NOTE — Progress Notes (Signed)
Daily Session Note  Patient Details  Name: Mark Mack MRN: [id number] Date of Birth: 1956/04/05 Referring Provider:     Cardiac Rehab from 05/28/2018 in [facility] Cardiac and Pulmonary Rehab  Referring Provider  Ikonimidis      Encounter Date: 07/16/2018  Check In: Session Check In - 07/16/18 1640      Check-In   Supervising physician immediately available to respond to emergencies  See telemetry face sheet for immediately available ER MD    Location  [facility]-Cardiac & Pulmonary Rehab    Staff Present  Justin Mend RCP,RRT,BSRT;Meredith Sherryll Burger, RN Moises Blood, BS, ACSM CEP, Exercise Physiologist    Medication changes reported      No    Fall or balance concerns reported     No    Tobacco Cessation  No Change    Warm-up and Cool-down  Performed on first and last piece of equipment    Resistance Training Performed  Yes    VAD Patient?  No    PAD/SET Patient?  No      Pain Assessment   Currently in Pain?  No/denies    Multiple Pain Sites  No          Social History   Tobacco Use  Smoking Status Former Smoker  . Last attempt to quit: 03/23/2018  . Years since quitting: 0.3  Smokeless Tobacco Never Used    Goals Met:  Independence with exercise equipment Exercise tolerated well Personal goals reviewed No report of cardiac concerns or symptoms Strength training completed today  Goals Unmet:  Not Applicable  Comments: Pt able to follow exercise prescription today without complaint.  Will continue to monitor for progression.    Dr. Emily Filbert is Medical Director for [place] and [facility].

## 2018-07-20 DIAGNOSIS — Z951 Presence of aortocoronary bypass graft: Secondary | ICD-10-CM

## 2018-07-20 NOTE — Progress Notes (Signed)
Daily Session Note  Patient Details  Name: Mark Mack MRN: [id number] Date of Birth: 11/07/1955 Referring Provider:     Cardiac Rehab from 05/28/2018 in [facility] Cardiac and Pulmonary Rehab  Referring Provider  Ikonimidis      Encounter Date: 07/20/2018  Check In: Session Check In - 07/20/18 1720      Check-In   Supervising physician immediately available to respond to emergencies  See telemetry face sheet for immediately available ER MD    Location  [facility]-Cardiac & Pulmonary Rehab    Staff Present   , BA, ACSM CEP, Exercise Physiologist;Carroll Enterkin, RN, BSN;Kelly Hayes, BS, ACSM CEP, Exercise Physiologist    Medication changes reported      No    Fall or balance concerns reported     No    Warm-up and Cool-down  Performed on first and last piece of equipment    Resistance Training Performed  Yes    VAD Patient?  No    PAD/SET Patient?  No      Pain Assessment   Currently in Pain?  No/denies    Multiple Pain Sites  No          Social History   Tobacco Use  Smoking Status Former Smoker  . Last attempt to quit: 03/23/2018  . Years since quitting: 0.3  Smokeless Tobacco Never Used    Goals Met:  Independence with exercise equipment Exercise tolerated well No report of cardiac concerns or symptoms Strength training completed today  Goals Unmet:  Not Applicable  Comments: Pt able to follow exercise prescription today without complaint.  Will continue to monitor for progression.    Dr. Mark Miller is Medical Director for [facility] and [facility]. 

## 2018-07-23 VITALS — Ht 66.0 in | Wt 146.0 lb

## 2018-07-23 DIAGNOSIS — Z951 Presence of aortocoronary bypass graft: Secondary | ICD-10-CM

## 2018-07-23 NOTE — Progress Notes (Signed)
Daily Session Note  Patient Details  Name: Mark Mack MRN: [id number] Date of Birth: 1956-07-23 Referring Provider:     Cardiac Rehab from 05/28/2018 in [facility] Cardiac and Pulmonary Rehab  Referring Provider  Ikonimidis      Encounter Date: 07/23/2018  Check In: Session Check In - 07/23/18 1631      Check-In   Supervising physician immediately available to respond to emergencies  See telemetry face sheet for immediately available ER MD    Location  [facility]-Cardiac & Pulmonary Rehab    Staff Present  Justin Mend RCP,RRT,BSRT;Meredith Sherryll Burger, RN BSN;Leslie Castrejon RN, Moises Blood, BS, ACSM CEP, Exercise Physiologist    Medication changes reported      No    Fall or balance concerns reported     No    Warm-up and Cool-down  Performed on first and last piece of equipment    Resistance Training Performed  Yes    VAD Patient?  No    PAD/SET Patient?  No      Pain Assessment   Currently in Pain?  No/denies          Social History   Tobacco Use  Smoking Status Former Smoker  . Last attempt to quit: 03/23/2018  . Years since quitting: 0.3  Smokeless Tobacco Never Used    Goals Met:  Independence with exercise equipment Exercise tolerated well Personal goals reviewed No report of cardiac concerns or symptoms Strength training completed today  Goals Unmet:  Not Applicable  Comments: Pt able to follow exercise prescription today without complaint.  Will continue to monitor for progression.  [name] Name 05/28/18 1419 07/23/18 1644       6 Minute Walk   Phase  -  Discharge    Distance  1414 feet  1700 feet    Distance % Change  -  20 %    Distance Feet Change  -  286 ft    Walk Time  6 minutes  6 minutes    # of Rest Breaks  0  0    MPH  2.68  3.22    METS  4.43  4.4    RPE  11  12    Perceived Dyspnea   0  -    VO2 Peak  15.5  15.5    Symptoms  No  No    Resting HR  67 bpm  63 bpm    Resting BP  122/64  126/66    Resting Oxygen Saturation   98 %   -    Exercise Oxygen Saturation  during 6 min walk  99 %  98 %    Max Ex. HR  130 bpm  88 bpm    Max Ex. BP  160/70  162/80    2 Minute Post BP  120/70  -          Dr. Emily Filbert is Medical Director for [place] and [facility].

## 2018-07-29 ENCOUNTER — Encounter: Payer: BLUE CROSS/BLUE SHIELD | Attending: Surgery

## 2018-07-29 DIAGNOSIS — Z951 Presence of aortocoronary bypass graft: Secondary | ICD-10-CM | POA: Diagnosis not present

## 2018-07-29 NOTE — Progress Notes (Signed)
Daily Session Note  Patient Details  Name: Mark Mack MRN: [id number] Date of Birth: 05/12/1956 Referring Provider:     Cardiac Rehab from 05/28/2018 in [facility] Cardiac and Pulmonary Rehab  Referring Provider  Ikonimidis      Encounter Date: 07/29/2018  Check In: Session Check In - 07/29/18 1625      Check-In   Supervising physician immediately available to respond to emergencies  See telemetry face sheet for immediately available ER MD    Location  [facility]-Cardiac & Pulmonary Rehab    Staff Present  Renita Papa, RN Vickki Hearing, BA, ACSM CEP, Exercise Physiologist;Susanne Bice, RN, BSN, CCRP    Medication changes reported      No    Fall or balance concerns reported     No    Warm-up and Cool-down  Performed on first and last piece of equipment    Resistance Training Performed  Yes    VAD Patient?  No    PAD/SET Patient?  No      Pain Assessment   Currently in Pain?  No/denies    Multiple Pain Sites  No          Social History   Tobacco Use  Smoking Status Former Smoker  . Last attempt to quit: 03/23/2018  . Years since quitting: 0.3  Smokeless Tobacco Never Used    Goals Met:  Independence with exercise equipment Exercise tolerated well No report of cardiac concerns or symptoms Strength training completed today  Goals Unmet:  Not Applicable  Comments: Pt able to follow exercise prescription today without complaint.  Will continue to monitor for progression.    Dr. Emily Filbert is Medical Director for [facility].

## 2018-07-30 ENCOUNTER — Encounter: Payer: Self-pay | Admitting: Dietician

## 2018-08-03 DIAGNOSIS — Z951 Presence of aortocoronary bypass graft: Secondary | ICD-10-CM | POA: Diagnosis not present

## 2018-08-03 NOTE — Progress Notes (Signed)
Daily Session Note  Patient Details  Name: Mark Mack MRN: [id number] Date of Birth: 1956-01-07 Referring Provider:     Cardiac Rehab from 05/28/2018 in [facility] Cardiac and Pulmonary Rehab  Referring Provider  Ikonimidis      Encounter Date: 08/03/2018  Check In: Session Check In - 08/03/18 1724      Check-In   Supervising physician immediately available to respond to emergencies  See telemetry face sheet for immediately available ER MD    Location  [facility]-Cardiac & Pulmonary Rehab    Staff Present  Nada Maclachlan, BA, ACSM CEP, Exercise Physiologist;Meredith Sherryll Burger, RN Moises Blood, BS, ACSM CEP, Exercise Physiologist;Carroll Enterkin, RN, BSN    Medication changes reported      No    Fall or balance concerns reported     No    Tobacco Cessation  No Change    Warm-up and Cool-down  Performed on first and last piece of equipment    Resistance Training Performed  Yes    VAD Patient?  No    PAD/SET Patient?  No      Pain Assessment   Currently in Pain?  No/denies    Multiple Pain Sites  No          Social History   Tobacco Use  Smoking Status Former Smoker  . Last attempt to quit: 03/23/2018  . Years since quitting: 0.3  Smokeless Tobacco Never Used    Goals Met:  Independence with exercise equipment Exercise tolerated well No report of cardiac concerns or symptoms Strength training completed today  Goals Unmet:  Not Applicable  Comments: Pt able to follow exercise prescription today without complaint.  Will continue to monitor for progression.    Dr. Emily Filbert is Medical Director for [facility].

## 2018-08-05 ENCOUNTER — Encounter: Payer: Self-pay | Admitting: *Deleted

## 2018-08-05 ENCOUNTER — Encounter: Payer: BLUE CROSS/BLUE SHIELD | Admitting: *Deleted

## 2018-08-05 DIAGNOSIS — Z951 Presence of aortocoronary bypass graft: Secondary | ICD-10-CM

## 2018-08-05 NOTE — Progress Notes (Signed)
Cardiac Individual Treatment Plan  Patient Details  Name: Mark Mack MRN: [id number] Date of Birth: 1956/02/21 Referring Provider:     Cardiac Rehab from 05/28/2018 in [facility] Cardiac and Pulmonary Rehab  Referring Provider  Ikonimidis      Initial Encounter Date:    Cardiac Rehab from 05/28/2018 in [facility] Cardiac and Pulmonary Rehab  Date  05/28/18      Visit Diagnosis: S/P CABG x 4  Patient's Home Medications on Admission:  Current Outpatient Medications:  .  acetaminophen (TYLENOL) 500 MG tablet, Take 1,000 mg by mouth every 6 (six) hours as needed. , Disp: , Rfl:  .  aspirin EC 81 MG tablet, Take 81 mg by mouth daily. , Disp: , Rfl:  .  docusate sodium (COLACE) 100 MG capsule, Take 1 capsule (100 mg total) by mouth daily. (Patient not taking: Reported on 05/28/2018), Disp: 10 capsule, Rfl: 0 .  esomeprazole (NEXIUM) 20 MG packet, Take 20 mg by mouth daily before breakfast. , Disp: , Rfl:  .  ezetimibe (ZETIA) 10 MG tablet, Take 10 mg by mouth daily., Disp: , Rfl: 2 .  ferrous sulfate 325 (65 FE) MG EC tablet, Take 1 tablet (325 mg total) by mouth daily with breakfast. (Patient not taking: Reported on 05/28/2018), Disp: 60 tablet, Rfl: 3 .  furosemide (LASIX) 20 MG tablet, Take 20 mg by mouth daily., Disp: , Rfl: 0 .  metoprolol succinate (TOPROL XL) 50 MG 24 hr tablet, Take 50 mg by mouth daily. , Disp: , Rfl:  .  ranitidine (ZANTAC) 150 MG tablet, , Disp: , Rfl:  .  rosuvastatin (CRESTOR) 20 MG tablet, Take 1 tablet (20 mg total) by mouth at bedtime. (Patient not taking: Reported on 05/28/2018), Disp: 30 tablet, Rfl: 0 .  senna-docusate (SENOKOT-S) 8.6-50 MG tablet, Take 1 tablet by mouth at bedtime as needed for mild constipation. (Patient not taking: Reported on 05/28/2018), Disp: , Rfl:   Past Medical History: Past Medical History:  Diagnosis Date  . Hypertension     Tobacco Use: Social History   Tobacco Use  Smoking Status Former Smoker  . Last attempt to quit: 03/23/2018  . Years  since quitting: 0.3  Smokeless Tobacco Never Used    Labs: Recent Review Flowsheet Data    There is no flowsheet data to display.       Exercise Target Goals: Exercise Program Goal: Individual exercise prescription set using results from initial 6 min walk test and THRR while considering  patient's activity barriers and safety.   Exercise Prescription Goal: Initial exercise prescription builds to 30-45 minutes a day of aerobic activity, 2-3 days per week.  Home exercise guidelines will be given to patient during program as part of exercise prescription that the participant will acknowledge.  Activity Barriers & Risk Stratification: Activity Barriers & Cardiac Risk Stratification - 05/28/18 1413      Activity Barriers & Cardiac Risk Stratification   Activity Barriers  Arthritis;Right Hip Replacement;Joint Problems   Needs Left Hip Replacement   Cardiac Risk Stratification  High       6 Minute Walk: 6 Minute Walk    Row Name 05/28/18 1419 07/23/18 1644       6 Minute Walk   Phase  -  Discharge    Distance  1414 feet  1700 feet    Distance % Change  -  20 %    Distance Feet Change  -  286 ft    Walk Time  6 minutes  6 minutes    # of Rest Breaks  0  0    MPH  2.68  3.22    METS  4.43  4.4    RPE  11  12    Perceived Dyspnea   0  -    VO2 Peak  15.5  15.5    Symptoms  No  No    Resting HR  67 bpm  63 bpm    Resting BP  122/64  126/66    Resting Oxygen Saturation   98 %  -    Exercise Oxygen Saturation  during 6 min walk  99 %  98 %    Max Ex. HR  130 bpm  88 bpm    Max Ex. BP  160/70  162/80    2 Minute Post BP  120/70  -       Oxygen Initial Assessment:   Oxygen Re-Evaluation:   Oxygen Discharge (Final Oxygen Re-Evaluation):   Initial Exercise Prescription: Initial Exercise Prescription - 05/28/18 1400      Date of Initial Exercise RX and Referring Provider   Date  05/28/18    Referring Provider  Ikonimidis      Treadmill   MPH  2.7    Grade  3     Minutes  15    METs  4.2      Recumbant Bike   Level  6    RPM  60    Watts  50    Minutes  15    METs  4.4      REL-XR   Level  4    Speed  50    Minutes  15    METs  4.4      Prescription Details   Frequency (times per week)  3    Duration  Progress to 45 minutes of aerobic exercise without signs/symptoms of physical distress      Intensity   THRR 40-80% of Max Heartrate  104-141    Ratings of Perceived Exertion  11-13    Perceived Dyspnea  0-4      Resistance Training   Training Prescription  Yes    Weight  3 lb    Reps  10-15       Perform Capillary Blood Glucose checks as needed.  Exercise Prescription Changes: Exercise Prescription Changes    Row Name 05/28/18 1400 06/02/18 1600 06/12/18 0800 06/15/18 1100 07/02/18 0900     Response to Exercise   Blood Pressure (Admit)  122/64  134/70  -  128/60  122/80   Blood Pressure (Exercise)  160/70  200/90 180/80  -  178/86  140/70   Blood Pressure (Exit)  120/70  112/66  -  124/72  114/66   Heart Rate (Admit)  62 bpm  67 bpm  -  80 bpm  82 bpm   Heart Rate (Exercise)  130 bpm  119 bpm  -  126 bpm  109 bpm   Heart Rate (Exit)  60 bpm  85 bpm  -  81 bpm  71 bpm   Oxygen Saturation (Admit)  98 %  -  -  -  -   Oxygen Saturation (Exercise)  99 %  -  -  -  -   Rating of Perceived Exertion (Exercise)  11  13  -  12  12   Symptoms  -  none  -  none  none   Comments  -  first  full day of exercise  -  -  -   Duration  -  Progress to 45 minutes of aerobic exercise without signs/symptoms of physical distress  -  Continue with 45 min of aerobic exercise without signs/symptoms of physical distress.  Continue with 45 min of aerobic exercise without signs/symptoms of physical distress.   Intensity  -  THRR unchanged  -  THRR unchanged  THRR unchanged     Progression   Progression  -  Continue to progress workloads to maintain intensity without signs/symptoms of physical distress.  -  Continue to progress workloads to maintain  intensity without signs/symptoms of physical distress.  Continue to progress workloads to maintain intensity without signs/symptoms of physical distress.   Average METs  -  4.26  -  3.97  4.3     Resistance Training   Training Prescription  -  Yes  -  Yes  Yes   Weight  -  4 lbs  -  5 lbs  5 lb   Reps  -  10-15  -  10-15  10-15     Interval Training   Interval Training  -  No  -  No  No     Treadmill   MPH  -  2  -  2.7  3   Grade  -  5  -  1  1   Minutes  -  15  -  15  15   METs  -  3.91  -  3.44  3.71     Recumbant Bike   Level  -  -  -  6  7   Watts  -  -  -  36  50   Minutes  -  -  -  15  15   METs  -  -  -  3.69  4.18     REL-XR   Level  -  4  -  4  -   Minutes  -  15  -  15  -   METs  -  4.6  -  4.8  -     Home Exercise Plan   Plans to continue exercise at  -  -  Home (comment) walking  Home (comment) walking  Home (comment) walking   Frequency  -  -  Add 2 additional days to program exercise sessions.  Add 2 additional days to program exercise sessions.  Add 2 additional days to program exercise sessions.   Initial Home Exercises Provided  -  -  06/12/18  06/12/18  06/12/18   Row Name 07/15/18 1400 07/30/18 0800           Response to Exercise   Blood Pressure (Admit)  112/60  118/62      Blood Pressure (Exercise)  152/78  152/84      Blood Pressure (Exit)  108/60  122/62      Heart Rate (Admit)  61 bpm  71 bpm      Heart Rate (Exercise)  118 bpm  112 bpm      Heart Rate (Exit)  92 bpm  84 bpm      Rating of Perceived Exertion (Exercise)  12  12      Symptoms  none  none      Duration  Continue with 45 min of aerobic exercise without signs/symptoms of physical distress.  Continue with 45 min of aerobic exercise without signs/symptoms of physical distress.  Intensity  THRR unchanged  THRR unchanged        Progression   Progression  Continue to progress workloads to maintain intensity without signs/symptoms of physical distress.  Continue to progress  workloads to maintain intensity without signs/symptoms of physical distress.      Average METs  4.4  3.9        Resistance Training   Training Prescription  Yes  Yes      Weight  5 lb  7 lb      Reps  10-15  10-15        Interval Training   Interval Training  No  Yes      Equipment  -  REL-XR;Recumbant Bike        Treadmill   MPH  -  3.2      Grade  -  0      Minutes  -  15      METs  -  3.44        Recumbant Bike   Level  7  7      RPM  60  60      Watts  55  40      Minutes  15  15      METs  4.32  4.39        REL-XR   Level  5  -      Speed  50  -      Minutes  15  -      METs  4.5  -        Home Exercise Plan   Plans to continue exercise at  Home (comment) walking  Home (comment) walking      Frequency  Add 2 additional days to program exercise sessions.  Add 2 additional days to program exercise sessions.      Initial Home Exercises Provided  06/12/18  06/12/18         Exercise Comments: Exercise Comments    Row Name 06/01/18 0809 07/23/18 1647         Exercise Comments   First full day of exercise!  Patient was oriented to gym and equipment including functions, settings, policies, and procedures.  Patient's individual exercise prescription and treatment plan were reviewed.  All starting workloads were established based on the results of the 6 minute walk test done at initial orientation visit.  The plan for exercise progression was also introduced and progression will be customized based on patient's performance and goals.  6 min walk assessment done today. See 6 min walk data for detailed results. Results discussed with patient.          Exercise Goals and Review: Exercise Goals    Row Name 05/28/18 1419             Exercise Goals   Increase Physical Activity  Yes       Intervention  Provide advice, education, support and counseling about physical activity/exercise needs.;Develop an individualized exercise prescription for aerobic and resistive  training based on initial evaluation findings, risk stratification, comorbidities and participant's personal goals.       Expected Outcomes  Short Term: Attend rehab on a regular basis to increase amount of physical activity.;Long Term: Add in home exercise to make exercise part of routine and to increase amount of physical activity.;Long Term: Exercising regularly at least 3-5 days a week.       Increase Strength and Stamina  Yes  Intervention  Provide advice, education, support and counseling about physical activity/exercise needs.;Develop an individualized exercise prescription for aerobic and resistive training based on initial evaluation findings, risk stratification, comorbidities and participant's personal goals.       Expected Outcomes  Short Term: Increase workloads from initial exercise prescription for resistance, speed, and METs.;Short Term: Perform resistance training exercises routinely during rehab and add in resistance training at home;Long Term: Improve cardiorespiratory fitness, muscular endurance and strength as measured by increased METs and functional capacity (6MWT)       Able to understand and use rate of perceived exertion (RPE) scale  Yes       Intervention  Provide education and explanation on how to use RPE scale       Expected Outcomes  Short Term: Able to use RPE daily in rehab to express subjective intensity level;Long Term:  Able to use RPE to guide intensity level when exercising independently       Able to understand and use Dyspnea scale  Yes       Intervention  Provide education and explanation on how to use Dyspnea scale       Expected Outcomes  Short Term: Able to use Dyspnea scale daily in rehab to express subjective sense of shortness of breath during exertion;Long Term: Able to use Dyspnea scale to guide intensity level when exercising independently       Knowledge and understanding of Target Heart Rate Range (THRR)  Yes       Intervention  Provide education  and explanation of THRR including how the numbers were predicted and where they are located for reference       Expected Outcomes  Short Term: Able to state/look up THRR;Short Term: Able to use daily as guideline for intensity in rehab;Long Term: Able to use THRR to govern intensity when exercising independently       Able to check pulse independently  Yes       Intervention  Provide education and demonstration on how to check pulse in carotid and radial arteries.;Review the importance of being able to check your own pulse for safety during independent exercise       Expected Outcomes  Short Term: Able to explain why pulse checking is important during independent exercise;Long Term: Able to check pulse independently and accurately       Understanding of Exercise Prescription  Yes       Intervention  Provide education, explanation, and written materials on patient's individual exercise prescription       Expected Outcomes  Short Term: Able to explain program exercise prescription;Long Term: Able to explain home exercise prescription to exercise independently          Exercise Goals Re-Evaluation : Exercise Goals Re-Evaluation    Row Name 06/01/18 0809 06/12/18 0836 06/15/18 1150 07/02/18 0948 07/15/18 1442     Exercise Goal Re-Evaluation   Exercise Goals Review  Increase Physical Activity;Increase Strength and Stamina;Able to understand and use rate of perceived exertion (RPE) scale;Able to check pulse independently;Knowledge and understanding of Target Heart Rate Range (THRR);Understanding of Exercise Prescription  Increase Physical Activity;Able to understand and use rate of perceived exertion (RPE) scale;Knowledge and understanding of Target Heart Rate Range (THRR);Understanding of Exercise Prescription;Increase Strength and Stamina;Able to understand and use Dyspnea scale;Able to check pulse independently  Increase Physical Activity;Increase Strength and Stamina;Understanding of Exercise  Prescription  Increase Physical Activity;Increase Strength and Stamina;Able to understand and use rate of perceived exertion (RPE) scale;Knowledge and understanding of  Target Heart Rate Range (THRR)  Increase Physical Activity;Increase Strength and Stamina;Able to understand and use rate of perceived exertion (RPE) scale;Knowledge and understanding of Target Heart Rate Range (THRR);Understanding of Exercise Prescription   Comments  Reviewed RPE scale, THR and program prescription with pt today.  Pt voiced understanding and was given a copy of goals to take home.   Reviewed home exercise with pt today.  Pt plans to walk at home and at track [facility] for exercise.  Reviewed THR, pulse, RPE, sign and symptoms, NTG use, and when to call 911 or MD.  Also discussed weather considerations and indoor options.  Pt voiced understanding.  Matyas is off to a good start in rehab.  He is already up to 2.7 mph on the treadmill.  We will continue to monitor his progress.   Kalep is making steady progress and has increased MET level.  Staff will discuss interval training with Elberta Fortis.  Braheem has progressed well and has increased overall MET level slightly.  Staff will introduce interval training at next session.   Expected Outcomes  Short: Use RPE daily to regulate intensity. Long: Follow program prescription in THR.  Short: Start to walk on his off days.  Long: Continue to exercise independently  Short: Continue to work on increasing workloads. Long: Continue to exercise on off days.   Short - add interval training Long - increase overall MET level  Short - add intervals to program Long - improve METs from current level   Row Name 07/30/18 0827             Exercise Goal Re-Evaluation   Exercise Goals Review  Increase Physical Activity;Increase Strength and Stamina;Able to understand and use rate of perceived exertion (RPE) scale;Knowledge and understanding of Target Heart Rate Range (THRR);Understanding of  Exercise Prescription       Comments  Muhsin completed his post 6 min walk and improved by 286 feet, 20 % more than first walk.       Expected Outcomes  Short - complete HT Long - maintain exercise on his own          Discharge Exercise Prescription (Final Exercise Prescription Changes): Exercise Prescription Changes - 07/30/18 0800      Response to Exercise   Blood Pressure (Admit)  118/62    Blood Pressure (Exercise)  152/84    Blood Pressure (Exit)  122/62    Heart Rate (Admit)  71 bpm    Heart Rate (Exercise)  112 bpm    Heart Rate (Exit)  84 bpm    Rating of Perceived Exertion (Exercise)  12    Symptoms  none    Duration  Continue with 45 min of aerobic exercise without signs/symptoms of physical distress.    Intensity  THRR unchanged      Progression   Progression  Continue to progress workloads to maintain intensity without signs/symptoms of physical distress.    Average METs  3.9      Resistance Training   Training Prescription  Yes    Weight  7 lb    Reps  10-15      Interval Training   Interval Training  Yes    Equipment  REL-XR;Recumbant Bike      Treadmill   MPH  3.2    Grade  0    Minutes  15    METs  3.44      Recumbant Bike   Level  7    RPM  60    Watts  40    Minutes  15    METs  4.39      Home Exercise Plan   Plans to continue exercise at  Home (comment)   walking   Frequency  Add 2 additional days to program exercise sessions.    Initial Home Exercises Provided  06/12/18       Nutrition:  Target Goals: Understanding of nutrition guidelines, daily intake of sodium <1555m, cholesterol <2034m calories 30% from fat and 7% or less from saturated fats, daily to have 5 or more servings of fruits and vegetables.  Biometrics: Pre Biometrics - 05/28/18 1417      Pre Biometrics   Height  _0  (1.676 m)    Weight  144 lb (65.3 kg)    Waist Circumference  30.5 inches    Hip Circumference  37.5 inches    Waist to Hip Ratio  0.81 %    BMI  (Calculated)  23.25    Single Leg Stand  14.5 seconds      Post Biometrics - 07/23/18 1646       Post  Biometrics   Height  _1  (1.676 m)    Weight  146 lb (66.2 kg)    Waist Circumference  32.5 inches    Hip Circumference  37 inches    Waist to Hip Ratio  0.88 %    BMI (Calculated)  23.58    Single Leg Stand  30 seconds       Nutrition Therapy Plan and Nutrition Goals: Nutrition Therapy & Goals - 06/08/18 0954      Nutrition Therapy   Diet  TLC    Protein (specify units)  10oz    Fiber  30 grams    Whole Grain Foods  3 servings   does not always choose whole grains   Saturated Fats  16 max. grams    Fruits and Vegetables  5 servings/day   8 ideal, does not eat fruit regularly   Sodium  2000 grams      Personal Nutrition Goals   Nutrition Goal  Include fruits as snacks between meals to both increase daily intake of fruits and to choose more nutritious snack options    Personal Goal #2  Work to decrease sugar sweetened beverage consumption; for example, choose PowerAde Zero rather than regular PowerAde    Personal Goal #3  Choose low-sugar cereal options. A low-sugar option will have 10g of sugar or less per serving    Personal Goal #4  Practice reading nutrition facts labels to identify foods high and low in sodium    Comments  He currently chooses processed and high sugar snack options, consumes sugar sweetened beverages IE sweet tea and soda daily, chooses some fried foods, and does not eat fruits other than bananas regularly.      Intervention Plan   Intervention  Prescribe, educate and counsel regarding individualized specific dietary modifications aiming towards targeted core components such as weight, hypertension, lipid management, diabetes, heart failure and other comorbidities.;Nutrition handout(s) given to patient.   low sodium nutrition therapy   Expected Outcomes  Short Term Goal: Understand basic principles of dietary content, such as calories, fat, sodium,  cholesterol and nutrients.;Short Term Goal: A plan has been developed with personal nutrition goals set during dietitian appointment.;Long Term Goal: Adherence to prescribed nutrition plan.       Nutrition Assessments: Nutrition Assessments - 05/28/18 1359      MEDFICTS Scores  Pre Score  45       Nutrition Goals Re-Evaluation: Nutrition Goals Re-Evaluation    [place] Name 06/08/18 1005 07/30/18 0914           Goals   Nutrition Goal  Include fruits as snacks between meals to both increase daily intake of fruits and to choose more nutritious snack options  Include fruits as snacks to both include more servings of fruit in the diet and to swap out less nutritious/ more processed snack options; work to decrease sources of added sugar such as cereal and sugar sweetened beverages; read food labels more regularly      Comment  He does not consume fruits regularly other than bananas and chooses snacks like chips, little debby's, chocolate covered peanuts, and oatmeal cream pies  He feels he is improving his diet. He is choosing more nutritious snacks, reading labels for lower salt options IE low salt chips, has decreased soda intake significantly (still drinks PowerAde occasionally), eating less fried food, and eating more beans/ vegetables/ fruits. He is finding it challening to lower red meat intake      Expected Outcome  He will choose fruits as snacks more regularly, and also incorporate other snacks with greater nutritional value such as [employer] bars or trail mix  He will continue to read food labels regularly, choose lower sodium and sugar options, and continue to work on reducing intake of red meats. He will swap more of his processed snacks for whole food - based choices        Personal Goal #2 Re-Evaluation   Personal Goal #2  Work to decrease sugar sweetened beverage consumption; for example choose PowerAde Zero rather than regular PoweraAde  -        Personal Goal #3 Re-Evaluation    Personal Goal #3  Choose low-sugar cereal options. A low sugar option will have 10g of sugar or less per serving  -        Personal Goal #4 Re-Evaluation   Personal Goal #4  Practice reading nutrition facts labels to identify foods high and low in sodium  -         Nutrition Goals Discharge (Final Nutrition Goals Re-Evaluation): Nutrition Goals Re-Evaluation - 07/30/18 0914      Goals   Nutrition Goal  Include fruits as snacks to both include more servings of fruit in the diet and to swap out less nutritious/ more processed snack options; work to decrease sources of added sugar such as cereal and sugar sweetened beverages; read food labels more regularly    Comment  He feels he is improving his diet. He is choosing more nutritious snacks, reading labels for lower salt options IE low salt chips, has decreased soda intake significantly (still drinks PowerAde occasionally), eating less fried food, and eating more beans/ vegetables/ fruits. He is finding it challening to lower red meat intake    Expected Outcome  He will continue to read food labels regularly, choose lower sodium and sugar options, and continue to work on reducing intake of red meats. He will swap more of his processed snacks for whole food - based choices       Psychosocial: Target Goals: Acknowledge presence or absence of significant depression and/or stress, maximize coping skills, provide positive support system. Participant is able to verbalize types and ability to use techniques and skills needed for reducing stress and depression.   Initial Review & Psychosocial Screening: Initial Psych Review & Screening - 05/28/18 1359  Initial Review   Current issues with  Current Stress Concerns    Comments  Returning to work the end of August. Works painting car parts in the heat, long hours. He knows he needs to take it easy returning to work. He is ready to get back to his normal routine.       Family Dynamics   Good  Support System?  Yes   Wife, family     Barriers   Psychosocial barriers to participate in program  There are no identifiable barriers or psychosocial needs.;The patient should benefit from training in stress management and relaxation.      Screening Interventions   Interventions  Program counselor consult;To provide support and resources with identified psychosocial needs;Encouraged to exercise;Provide feedback about the scores to participant    Expected Outcomes  Short Term goal: Utilizing psychosocial counselor, staff and physician to assist with identification of specific Stressors or current issues interfering with healing process. Setting desired goal for each stressor or current issue identified.;Long Term Goal: Stressors or current issues are controlled or eliminated.;Short Term goal: Identification and review with participant of any Quality of Life or Depression concerns found by scoring the questionnaire.;Long Term goal: The participant improves quality of Life and PHQ9 Scores as seen by post scores and/or verbalization of changes       Quality of Life Scores:  Quality of Life - 05/28/18 1407      Quality of Life   Select  Quality of Life      Quality of Life Scores   Health/Function Pre  25.73 %    Socioeconomic Pre  21.88 %    Psych/Spiritual Pre  27.93 %    Family Pre  24 %    GLOBAL Pre  25.04 %      Scores of 19 and below usually indicate a poorer quality of life in these areas.  A difference of  2-3 points is a clinically meaningful difference.  A difference of 2-3 points in the total score of the Quality of Life Index has been associated with significant improvement in overall quality of life, self-image, physical symptoms, and general health in studies assessing change in quality of life.  PHQ-9: Recent Review Flowsheet Data    Depression screen [facility] 2/9 05/28/2018   Decreased Interest 2   Down, Depressed, Hopeless 0   PHQ - 2 Score 2   Altered sleeping 0   Tired,  decreased energy 0   Change in appetite 1   Feeling bad or failure about yourself  0   Trouble concentrating 0   Moving slowly or fidgety/restless 0   Suicidal thoughts 0   PHQ-9 Score 3   Difficult doing work/chores Not difficult at all     Interpretation of Total Score  Total Score Depression Severity:  1-4 = Minimal depression, 5-9 = Mild depression, 10-14 = Moderate depression, 15-19 = Moderately severe depression, 20-27 = Severe depression   Psychosocial Evaluation and Intervention: Psychosocial Evaluation - 06/10/18 0911      Psychosocial Evaluation & Interventions   Interventions  Relaxation education;Stress management education;Encouraged to exercise with the program and follow exercise prescription    Comments  Counselor met with Mr. Zaccone Darley) today for initial psychosocial evaluation.  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  [place] has a strong support system with a sposue of 20 years; an adult daughter and son who live locally and active involvement in his local church.  He  reports being in fairly good health other than his heart with the exception of the need for hip replacement in the future.  [place]el states he sleeps well and has a good appetite.  He denies a history of depression or anxiety or any current symptoms and he is typically in a positive mood.  Aslan says his primary stressors are being out of work currently and his health.  He will return to work on 8/26.  His goals for this program are to lose weight; get back to work; and continue to increase his stamina and energy.  Staff will follow with Elberta Fortis.     Expected Outcomes  Short:  Carnell will continue exercising for his health and stress management.  He will attend the psychoeducational components of this program to learn more strategies to manage stress and set healthier limits on his work hours.  Long:  Shane will continue a routine of exercise and positive self-care upon completion of this  program.      Continue Psychosocial Services   Follow up required by staff       Psychosocial Re-Evaluation: Psychosocial Re-Evaluation    [place] Name 06/19/18 [phone] 07/17/18 0851           Psychosocial Re-Evaluation   Current issues with  Current Stress Concerns  Current Stress Concerns      Comments  Amar goes back to work next Monday.  He has some stress at home that is normal.  He manages stress by exercising and he is working on breathing techniques.   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      Expected Outcomes  Short - continue to attend HT Long - manage stress with exrecise and breathing  Short - continue to attend HT to stay fit for work Long - exercise on his own      Interventions  Encouraged to attend Cardiac Rehabilitation for the exercise;Stress management education;Relaxation education  -      Continue Psychosocial Services   Follow up required by staff  -         Psychosocial Discharge (Final Psychosocial Re-Evaluation): Psychosocial Re-Evaluation - 07/17/18 0851      Psychosocial Re-Evaluation   Current issues with  Current Stress Concerns    Comments  Canaan was concerned 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    Expected Outcomes  Short - continue to attend HT to stay fit for work Long - exercise on his own       Vocational Rehabilitation: Provide vocational rehab assistance to qualifying candidates.   Vocational Rehab Evaluation & Intervention: Vocational Rehab - 05/28/18 1412      Initial Vocational Rehab Evaluation & Intervention   Assessment shows need for Vocational Rehabilitation  No       Education: Education Goals: Education classes will be provided on a variety of topics geared toward better understanding of heart health and risk factor modification. Participant will state understanding/return demonstration of topics presented as noted by education test  scores.  Learning Barriers/Preferences: Learning Barriers/Preferences - 05/28/18 1410      Learning Barriers/Preferences   Learning Barriers  Exercise Concerns    Learning Preferences  Computer/Internet;Verbal Instruction;Group Instruction;Skilled Demonstration;Video       Education Topics:  AED/CPR: - Group verbal and written instruction with the use of models to demonstrate the basic use of the AED with the basic ABC's of resuscitation.  Cardiac Rehab from 08/05/2018 in [facility] Cardiac and Pulmonary Rehab  Date  07/13/18  (Pended)   Educator  CE  [place])   Instruction Review Code  1- Verbalizes Understanding  (Pended)       General Nutrition Guidelines/Fats and Fiber: -Group instruction provided by verbal, written material, models and posters to present the general guidelines for heart healthy nutrition. Gives an explanation and review of dietary fats and fiber.   Cardiac Rehab from 08/05/2018 in [facility] Cardiac and Pulmonary Rehab  Date  07/06/18  (Pended)   Educator  [place]  (Pended)   Instruction Review Code  1- Verbalizes Understanding  (Pended)       Controlling Sodium/Reading Food Labels: -Group verbal and written material supporting the discussion of sodium use in heart healthy nutrition. Review and explanation with models, verbal and written materials for utilization of the food label.   Cardiac Rehab from 08/05/2018 in [facility] Cardiac and Pulmonary Rehab  Date  07/15/18  (Pended)   Educator  LB  (Pended)   Instruction Review Code  1- Verbalizes Understanding  (Pended)       Exercise Physiology & General Exercise Guidelines: - Group verbal and written instruction with models to review the exercise physiology of the cardiovascular system and associated critical values. Provides general exercise guidelines with specific guidelines to those with heart or lung disease.    Aerobic Exercise & Resistance Training: - Gives group verbal and written instruction on the various  components of exercise. Focuses on aerobic and resistive training programs and the benefits of this training and how to safely progress through these programs..   Cardiac Rehab from 08/05/2018 in [facility] Cardiac and Pulmonary Rehab  Date  06/01/18  (Pended)   Educator  [facility]  (Pended)   Instruction Review Code  1- Verbalizes Understanding  (Pended)       Flexibility, Balance, Mind/Body Relaxation: Provides group verbal/written instruction on the benefits of flexibility and balance training, including mind/body exercise modes such as yoga, pilates and tai chi.  Demonstration and skill practice provided.   Cardiac Rehab from 08/05/2018 in [facility] Cardiac and Pulmonary Rehab  Date  08/03/18  (Pended)   Educator  [place]  (Pended)   Instruction Review Code  1- Verbalizes Understanding  (Pended)       Stress and Anxiety: - Provides group verbal and written instruction about the health risks of elevated stress and causes of high stress.  Discuss the correlation between heart/lung disease and anxiety and treatment options. Review healthy ways to manage with stress and anxiety.   Cardiac Rehab from 08/05/2018 in [facility] Cardiac and Pulmonary Rehab  Date  06/17/18  (Pended)   Educator  [place]  (Pended)   Instruction Review Code  1- Verbalizes Understanding  (Pended)       Depression: - Provides group verbal and written instruction on the correlation between heart/lung disease and depressed mood, treatment options, and the stigmas associated with seeking treatment.   Cardiac Rehab from 08/05/2018 in [facility] Cardiac and Pulmonary Rehab  Date  07/29/18  (Pended)   Educator  [place]  (Pended)   Instruction Review Code  1- Verbalizes Understanding  (Pended)       Anatomy & Physiology of the Heart: - Group verbal and written instruction and models provide basic cardiac anatomy and physiology, with the coronary electrical and arterial systems. Review of Valvular disease and Heart Failure   Cardiac Rehab from 08/05/2018 in [facility]  Cardiac and Pulmonary Rehab  Date  06/15/18  (Pended)  Educator  CE  [place])   Instruction Review Code  1- Verbalizes Understanding  (Pended)       Cardiac Procedures: - Group verbal and written instruction to review commonly prescribed medications for heart disease. Reviews the medication, class of the drug, and side effects. Includes the steps to properly store meds and maintain the prescription regimen. (beta blockers and nitrates)   Cardiac Medications I: - Group verbal and written instruction to review commonly prescribed medications for heart disease. Reviews the medication, class of the drug, and side effects. Includes the steps to properly store meds and maintain the prescription regimen.   Cardiac Rehab from 08/05/2018 in [facility] Cardiac and Pulmonary Rehab  Date  06/22/18  (Pended)   Educator  CE  [place])   Instruction Review Code  1- Verbalizes Understanding  (Pended)       Cardiac Medications II: -Group verbal and written instruction to review commonly prescribed medications for heart disease. Reviews the medication, class of the drug, and side effects. (all other drug classes)   Cardiac Rehab from 08/05/2018 in [facility] Cardiac and Pulmonary Rehab  Date  06/10/18  (Pended)   Educator  [place]  (Pended)   Instruction Review Code  1- Verbalizes Understanding  (Pended)        Go Sex-Intimacy & Heart Disease, Get SMART - Goal Setting: - Group verbal and written instruction through game format to discuss heart disease and the return to sexual intimacy. Provides group verbal and written material to discuss and apply goal setting through the application of the S.M.A.R.T. Method.   Other Matters of the Heart: - Provides group verbal, written materials and models to describe Stable Angina and Peripheral Artery. Includes description of the disease process and treatment options available to the cardiac patient.   Cardiac Rehab from 08/05/2018 in [facility] Cardiac and Pulmonary Rehab  Date   06/15/18  (Pended)   Educator  CE  [place])   Instruction Review Code  1- Verbalizes Understanding  (Pended)       Exercise & Equipment Safety: - Individual verbal instruction and demonstration of equipment use and safety with use of the equipment.   Cardiac Rehab from 08/05/2018 in [facility] Cardiac and Pulmonary Rehab  Date  05/28/18  (Pended)   Educator  [facility]  (Pended)   Instruction Review Code  1- Verbalizes Understanding  (Pended)       Infection Prevention: - Provides verbal and written material to individual with discussion of infection control including proper hand washing and proper equipment cleaning during exercise session.   Cardiac Rehab from 08/05/2018 in [facility] Cardiac and Pulmonary Rehab  Date  05/28/18  (Pended)   Educator  [facility]  (Pended)   Instruction Review Code  1- Verbalizes Understanding  (Pended)       Falls Prevention: - Provides verbal and written material to individual with discussion of falls prevention and safety.   Cardiac Rehab from 08/05/2018 in [facility] Cardiac and Pulmonary Rehab  Date  05/28/18  (Pended)   Educator  [facility]  (Pended)   Instruction Review Code  1- Verbalizes Understanding  (Pended)       Diabetes: - Individual verbal and written instruction to review signs/symptoms of diabetes, desired ranges of glucose level fasting, after meals and with exercise. Acknowledge that pre and post exercise glucose checks will be done for 3 sessions at entry of program.   Know Your Numbers and Risk Factors: -Group verbal and written instruction about important numbers in your health.  Discussion of what  are risk factors and how they play a role in the disease process.  Review of Cholesterol, Blood Pressure, Diabetes, and BMI and the role they play in your overall health.   Cardiac Rehab from 08/05/2018 in [facility] Cardiac and Pulmonary Rehab  Date  06/10/18  (Pended)   Educator  [place]  (Pended)   Instruction Review Code  1- Verbalizes Understanding  (Pended)       Sleep  Hygiene: -Provides group verbal and written instruction about how sleep can affect your health.  Define sleep hygiene, discuss sleep cycles and impact of sleep habits. Review good sleep hygiene tips.    Other: -Provides group and verbal instruction on various topics (see comments)   Knowledge Questionnaire Score: Knowledge Questionnaire Score - 05/28/18 1411      Knowledge Questionnaire Score   Pre Score  19/26   correct answers reviewed with Elberta Fortis, focus on exercise and nutrition      Core Components/Risk Factors/Patient Goals at Admission: Personal Goals and Risk Factors at Admission - 05/28/18 1356      Core Components/Risk Factors/Patient Goals on Admission    Weight Management  Yes;Weight Gain    Intervention  Weight Management: Develop a combined nutrition and exercise program designed to reach desired caloric intake, while maintaining appropriate intake of nutrient and fiber, sodium and fats, and appropriate energy expenditure required for the weight goal.;Weight Management: Provide education and appropriate resources to help participant work on and attain dietary goals.    Admit Weight  144 lb (65.3 kg)    Goal Weight: Short Term  148 lb (67.1 kg)    Goal Weight: Long Term  150 lb (68 kg)    Expected Outcomes  Short Term: Continue to assess and modify interventions until short term weight is achieved;Long Term: Adherence to nutrition and physical activity/exercise program aimed toward attainment of established weight goal;Weight Maintenance: Understanding of the daily nutrition guidelines, which includes 25-35% calories from fat, 7% or less cal from saturated fats, less than '200mg'$  cholesterol, less than 1.5gm of sodium, & 5 or more servings of fruits and vegetables daily;Understanding recommendations for meals to include 15-35% energy as protein, 25-35% energy from fat, 35-60% energy from carbohydrates, less than '200mg'$  of dietary cholesterol, 20-35 gm of total fiber  daily;Understanding of distribution of calorie intake throughout the day with the consumption of 4-5 meals/snacks;Weight Gain: Understanding of general recommendations for a high calorie, high protein meal plan that promotes weight gain by distributing calorie intake throughout the day with the consumption for 4-5 meals, snacks, and/or supplements    Tobacco Cessation  Yes   quit 5/24   Intervention  Assist the participant in steps to quit. Provide individualized education and counseling about committing to Tobacco Cessation, relapse prevention, and pharmacological support that can be provided by physician.;Advice worker, assist with locating and accessing local/national Quit Smoking programs, and support quit date choice.    Expected Outcomes  Short Term: Will demonstrate readiness to quit, by selecting a quit date.;Long Term: Complete abstinence from all tobacco products for at least 12 months from quit date.;Short Term: Will quit all tobacco product use, adhering to prevention of relapse plan.    Hypertension  Yes    Intervention  Provide education on lifestyle modifcations including regular physical activity/exercise, weight management, moderate sodium restriction and increased consumption of fresh fruit, vegetables, and low fat dairy, alcohol moderation, and smoking cessation.;Monitor prescription use compliance.    Expected Outcomes  Long Term: Maintenance of blood pressure at goal  levels.;Short Term: Continued assessment and intervention until BP is < 140/47m HG in hypertensive participants. < 130/876mHG in hypertensive participants with diabetes, heart failure or chronic kidney disease.    Lipids  Yes    Intervention  Provide education and support for participant on nutrition & aerobic/resistive exercise along with prescribed medications to achieve LDL '70mg'$ , HDL >'40mg'$ .    Expected Outcomes  Short Term: Participant states understanding of desired cholesterol values and is  compliant with medications prescribed. Participant is following exercise prescription and nutrition guidelines.;Long Term: Cholesterol controlled with medications as prescribed, with individualized exercise RX and with personalized nutrition plan. Value goals: LDL < '70mg'$ , HDL > 40 mg.       Core Components/Risk Factors/Patient Goals Review:  Goals and Risk Factor Review    Row Name 06/19/18 0826 07/17/18 0848           Core Components/Risk Factors/Patient Goals Review   Personal Goals Review  Weight Management/Obesity;Lipids;Hypertension  Lipids;Hypertension;Other;Stress      Review  AnAizenet with the RD and is making healthier choices by reducing fat and sodium.  He takes meds as directed.  BP has been in normal limits during HT.  He is eating more fruits and vegetables.  he feels he has better stamina, strength and better mood since starting HT.  AnAmbrosioas felt good at work , evrything is going good for him.  He does know he needs to cut back on junk food and eating out.  We discussed ways to add more fruits and vegetables to his diet.  he does walk for up to an hour on Saturdays.         Expected Outcomes  Short - Continue to attend and make healthy changes to diet  Long - maintain exercise and dietary habits  Short - AnThermanill reduce the amount he eats out and add more fruits and vegetables Long - maintain exercise on his own         Core Components/Risk Factors/Patient Goals at Discharge (Final Review):  Goals and Risk Factor Review - 07/17/18 0848      Core Components/Risk Factors/Patient Goals Review   Personal Goals Review  Lipids;Hypertension;Other;Stress    Review  AnMcdanielas felt good at work , evrything is going good for him.  He does know he needs to cut back on junk food and eating out.  We discussed ways to add more fruits and vegetables to his diet.  he does walk for up to an hour on Saturdays.       Expected Outcomes  Short - AnWileyill reduce the amount he eats  out and add more fruits and vegetables Long - maintain exercise on his own       ITP Comments: ITP Comments    Row Name 05/28/18 1336 06/10/18 0757 07/08/18 0555 08/05/18 0741     ITP Comments  Med Review completed. Initial ITP created. [facility]  30 day review completed. ITP sent to Dr. BeRamonita Labcovering for Dr. MaEmily FilbertMedical Director of Cardiac Rehab. Continue with ITP unless changes are made by physician.  New to program  30 day review completed. ITP sent to Dr. MaEmily FilbertMedical Director of Cardiac Rehab. Continue with ITP unless changes are made by physician  30 day review completed. ITP sent to Dr. MaEmily FilbertMedical Director of Cardiac Rehab. Continue with ITP unless changes are made by physician       Comments: Finishing  36/36 sessions today.

## 2018-08-05 NOTE — Progress Notes (Signed)
Discharge Progress Report  Patient Details  Name: Mark Mack MRN: [id number] Date of Birth: [birth year] Referring Provider:     Cardiac Rehab from 05/28/2018 in [facility]  Referring Provider  Ikonimidis       Number of Visits: 36/36  Reason for Discharge:  Patient reached a stable level of exercise. Patient independent in their exercise. Patient has met program and personal goals.  Smoking History:  Social History   Tobacco Use  Smoking Status Former Smoker  . Last attempt to quit: 03/23/2018  . Years since quitting: 0.3  Smokeless Tobacco Never Used    Diagnosis:  S/P CABG x 4  ADL UCSD:   Initial Exercise Prescription: Initial Exercise Prescription - 05/28/18 1400      Date of Initial Exercise RX and Referring Provider   Date  05/28/18    Referring Provider  Ikonimidis      Treadmill   MPH  2.7    Grade  3    Minutes  15    METs  4.2      Recumbant Bike   Level  6    RPM  60    Watts  50    Minutes  15    METs  4.4      REL-XR   Level  4    Speed  50    Minutes  15    METs  4.4      Prescription Details   Frequency (times per week)  3    Duration  Progress to 45 minutes of aerobic exercise without signs/symptoms of physical distress      Intensity   THRR 40-80% of Max Heartrate  104-141    Ratings of Perceived Exertion  11-13    Perceived Dyspnea  0-4      Resistance Training   Training Prescription  Yes    Weight  3 lb    Reps  10-15       Discharge Exercise Prescription (Final Exercise Prescription Changes): Exercise Prescription Changes - 07/30/18 0800      Response to Exercise   Blood Pressure (Admit)  118/62    Blood Pressure (Exercise)  152/84    Blood Pressure (Exit)  122/62    Heart Rate (Admit)  71 bpm    Heart Rate (Exercise)  112 bpm    Heart Rate (Exit)  84 bpm    Rating of Perceived Exertion (Exercise)  12    Symptoms  none    Duration  Continue with 45 min of aerobic exercise without  signs/symptoms of physical distress.    Intensity  THRR unchanged      Progression   Progression  Continue to progress workloads to maintain intensity without signs/symptoms of physical distress.    Average METs  3.9      Resistance Training   Training Prescription  Yes    Weight  7 lb    Reps  10-15      Interval Training   Interval Training  Yes    Equipment  REL-XR;Recumbant Bike      Treadmill   MPH  3.2    Grade  0    Minutes  15    METs  3.44      Recumbant Bike   Level  7    RPM  60    Watts  40    Minutes  15    METs  4.39      Home Exercise  Plan   Plans to continue exercise at  Home (comment)   walking   Frequency  Add 2 additional days to program exercise sessions.    Initial Home Exercises Provided  06/12/18       Functional Capacity: 6 Minute Walk    Row Name 05/28/18 1419 07/23/18 1644       6 Minute Walk   Phase  -  Discharge    Distance  1414 feet  1700 feet    Distance % Change  -  20 %    Distance Feet Change  -  286 ft    Walk Time  6 minutes  6 minutes    # of Rest Breaks  0  0    MPH  2.68  3.22    METS  4.43  4.4    RPE  11  12    Perceived Dyspnea   0  -    VO2 Peak  15.5  15.5    Symptoms  No  No    Resting HR  67 bpm  63 bpm    Resting BP  122/64  126/66    Resting Oxygen Saturation   98 %  -    Exercise Oxygen Saturation  during 6 min walk  99 %  98 %    Max Ex. HR  130 bpm  88 bpm    Max Ex. BP  160/70  162/80    2 Minute Post BP  120/70  -       Psychological, QOL, Others - Outcomes: PHQ 2/9: Depression screen PHQ 2/9 05/28/2018  Decreased Interest 2  Down, Depressed, Hopeless 0  PHQ - 2 Score 2  Altered sleeping 0  Tired, decreased energy 0  Change in appetite 1  Feeling bad or failure about yourself  0  Trouble concentrating 0  Moving slowly or fidgety/restless 0  Suicidal thoughts 0  PHQ-9 Score 3  Difficult doing work/chores Not difficult at all    Quality of Life: Quality of Life - 05/28/18 1407       Quality of Life   Select  Quality of Life      Quality of Life Scores   Health/Function Pre  25.73 %    Socioeconomic Pre  21.88 %    Psych/Spiritual Pre  27.93 %    Family Pre  24 %    GLOBAL Pre  25.04 %       Personal Goals: Goals established at orientation with interventions provided to work toward goal. Personal Goals and Risk Factors at Admission - 05/28/18 1356      Core Components/Risk Factors/Patient Goals on Admission    Weight Management  Yes;Weight Gain    Intervention  Weight Management: Develop a combined nutrition and exercise program designed to reach desired caloric intake, while maintaining appropriate intake of nutrient and fiber, sodium and fats, and appropriate energy expenditure required for the weight goal.;Weight Management: Provide education and appropriate resources to help participant work on and attain dietary goals.    Admit Weight  144 lb (65.3 kg)    Goal Weight: Short Term  148 lb (67.1 kg)    Goal Weight: Long Term  150 lb (68 kg)    Expected Outcomes  Short Term: Continue to assess and modify interventions until short term weight is achieved;Long Term: Adherence to nutrition and physical activity/exercise program aimed toward attainment of established weight goal;Weight Maintenance: Understanding of the daily nutrition guidelines, which includes 25-35% calories from  fat, 7% or less cal from saturated fats, less than 248m cholesterol, less than 1.5gm of sodium, & 5 or more servings of fruits and vegetables daily;Understanding recommendations for meals to include 15-35% energy as protein, 25-35% energy from fat, 35-60% energy from carbohydrates, less than 2079mof dietary cholesterol, 20-35 gm of total fiber daily;Understanding of distribution of calorie intake throughout the day with the consumption of 4-5 meals/snacks;Weight Gain: Understanding of general recommendations for a high calorie, high protein meal plan that promotes weight gain by distributing  calorie intake throughout the day with the consumption for 4-5 meals, snacks, and/or supplements    Tobacco Cessation  Yes   quit 5/24   Intervention  Assist the participant in steps to quit. Provide individualized education and counseling about committing to Tobacco Cessation, relapse prevention, and pharmacological support that can be provided by physician.;OfAdvice workerassist with locating and accessing local/national Quit Smoking programs, and support quit date choice.    Expected Outcomes  Short Term: Will demonstrate readiness to quit, by selecting a quit date.;Long Term: Complete abstinence from all tobacco products for at least 12 months from quit date.;Short Term: Will quit all tobacco product use, adhering to prevention of relapse plan.    Hypertension  Yes    Intervention  Provide education on lifestyle modifcations including regular physical activity/exercise, weight management, moderate sodium restriction and increased consumption of fresh fruit, vegetables, and low fat dairy, alcohol moderation, and smoking cessation.;Monitor prescription use compliance.    Expected Outcomes  Long Term: Maintenance of blood pressure at goal levels.;Short Term: Continued assessment and intervention until BP is < 140/9061mG in hypertensive participants. < 130/6m42m in hypertensive participants with diabetes, heart failure or chronic kidney disease.    Lipids  Yes    Intervention  Provide education and support for participant on nutrition & aerobic/resistive exercise along with prescribed medications to achieve LDL <70mg9mL >40mg.60mExpected Outcomes  Short Term: Participant states understanding of desired cholesterol values and is compliant with medications prescribed. Participant is following exercise prescription and nutrition guidelines.;Long Term: Cholesterol controlled with medications as prescribed, with individualized exercise RX and with personalized nutrition plan. Value goals:  LDL < 70mg, 24m> 40 mg.        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  He takes meds as directed.  BP has been in normal limits during HT.  He is eating more fruits and vegetables.  he feels he has better stamina, strength and better mood since starting HT.  AnthonyZhionlt good at work , evrything is going good for him.  He does know he needs to cut back on junk food and eating out.  We discussed ways to add more fruits and vegetables to his diet.  he does walk for up to an hour on Saturdays.         Expected Outcomes  Short - Continue to attend and make healthy changes to diet  Long - maintain exercise and dietary habits  Short - AnthonyMackleneduce the amount he eats out and add more fruits and vegetables Long - maintain exercise on his own  Exercise Goals and Review: Exercise Goals    Row Name 05/28/18 1419             Exercise Goals   Increase Physical Activity  Yes       Intervention  Provide advice, education, support and counseling about physical activity/exercise needs.;Develop an individualized exercise prescription for aerobic and resistive training based on initial evaluation findings, risk stratification, comorbidities and participant's personal goals.       Expected Outcomes  Short Term: Attend rehab on a regular basis to increase amount of physical activity.;Long Term: Add in home exercise to make exercise part of routine and to increase amount of physical activity.;Long Term: Exercising regularly at least 3-5 days a week.       Increase Strength and Stamina  Yes       Intervention  Provide advice, education, support and counseling about physical  activity/exercise needs.;Develop an individualized exercise prescription for aerobic and resistive training based on initial evaluation findings, risk stratification, comorbidities and participant's personal goals.       Expected Outcomes  Short Term: Increase workloads from initial exercise prescription for resistance, speed, and METs.;Short Term: Perform resistance training exercises routinely during rehab and add in resistance training at home;Long Term: Improve cardiorespiratory fitness, muscular endurance and strength as measured by increased METs and functional capacity (6MWT)       Able to understand and use rate of perceived exertion (RPE) scale  Yes       Intervention  Provide education and explanation on how to use RPE scale       Expected Outcomes  Short Term: Able to use RPE daily in rehab to express subjective intensity level;Long Term:  Able to use RPE to guide intensity level when exercising independently       Able to understand and use Dyspnea scale  Yes       Intervention  Provide education and explanation on how to use Dyspnea scale       Expected Outcomes  Short Term: Able to use Dyspnea scale daily in rehab to express subjective sense of shortness of breath during exertion;Long Term: Able to use Dyspnea scale to guide intensity level when exercising independently       Knowledge and understanding of Target Heart Rate Range (THRR)  Yes       Intervention  Provide education and explanation of THRR including how the numbers were predicted and where they are located for reference       Expected Outcomes  Short Term: Able to state/look up THRR;Short Term: Able to use daily as guideline for intensity in rehab;Long Term: Able to use THRR to govern intensity when exercising independently       Able to check pulse independently  Yes       Intervention  Provide education and demonstration on how to check pulse in carotid and radial arteries.;Review the importance of being able to check your  own pulse for safety during independent exercise       Expected Outcomes  Short Term: Able to explain why pulse checking is important during independent exercise;Long Term: Able to check pulse independently and accurately       Understanding of Exercise Prescription  Yes       Intervention  Provide education, explanation, and written materials on patient's individual exercise prescription       Expected Outcomes  Short Term: Able to explain program exercise prescription;Long Term: Able to explain home exercise prescription to exercise independently  Nutrition & Weight - Outcomes: Pre Biometrics - 05/28/18 1417      Pre Biometrics   Height  _0  (1.676 m)    Weight  144 lb (65.3 kg)    Waist Circumference  30.5 inches    Hip Circumference  37.5 inches    Waist to Hip Ratio  0.81 %    BMI (Calculated)  23.25    Single Leg Stand  14.5 seconds      Post Biometrics - 07/23/18 1646       Post  Biometrics   Height  _1  (1.676 m)    Weight  146 lb (66.2 kg)    Waist Circumference  32.5 inches    Hip Circumference  37 inches    Waist to Hip Ratio  0.88 %    BMI (Calculated)  23.58    Single Leg Stand  30 seconds       Nutrition: Nutrition Therapy & Goals - 06/08/18 0954      Nutrition Therapy   Diet  TLC    Protein (specify units)  10oz    Fiber  30 grams    Whole Grain Foods  3 servings   does not always choose whole grains   Saturated Fats  16 max. grams    Fruits and Vegetables  5 servings/day   8 ideal, does not eat fruit regularly   Sodium  2000 grams      Personal Nutrition Goals   Nutrition Goal  Include fruits as snacks between meals to both increase daily intake of fruits and to choose more nutritious snack options    Personal Goal #2  Work to decrease sugar sweetened beverage consumption; for example, choose PowerAde Zero rather than regular PowerAde    Personal Goal #3  Choose low-sugar cereal options. A low-sugar option will have 10g of sugar or less  per serving    Personal Goal #4  Practice reading nutrition facts labels to identify foods high and low in sodium    Comments  He currently chooses processed and high sugar snack options, consumes sugar sweetened beverages IE sweet tea and soda daily, chooses some fried foods, and does not eat fruits other than bananas regularly.      Intervention Plan   Intervention  Prescribe, educate and counsel regarding individualized specific dietary modifications aiming towards targeted core components such as weight, hypertension, lipid management, diabetes, heart failure and other comorbidities.;Nutrition handout(s) given to patient.   low sodium nutrition therapy   Expected Outcomes  Short Term Goal: Understand basic principles of dietary content, such as calories, fat, sodium, cholesterol and nutrients.;Short Term Goal: A plan has been developed with personal nutrition goals set during dietitian appointment.;Long Term Goal: Adherence to prescribed nutrition plan.       Nutrition Discharge: Nutrition Assessments - 05/28/18 1359      MEDFICTS Scores   Pre Score  45       Education Questionnaire Score: Knowledge Questionnaire Score - 05/28/18 1411      Knowledge Questionnaire Score   Pre Score  19/26   correct answers reviewed with Elberta Fortis, focus on exercise and nutrition      Goals reviewed with patient; copy given to patient.

## 2018-08-05 NOTE — Patient Instructions (Addendum)
Discharge Patient Instructions  Patient Details  Name: Mark Mack MRN: [id number] Date of Birth: 08/16/1956 Referring Provider:  Concepcion Living, MD   Number of Visits: 36/36  Reason for Discharge:  Patient reached a stable level of exercise. Patient independent in their exercise. Patient has met program and personal goals.  Smoking History:  Social History   Tobacco Use  Smoking Status Former Smoker  . Last attempt to quit: 03/23/2018  . Years since quitting: 0.3  Smokeless Tobacco Never Used    Diagnosis:  S/P CABG x 4  Initial Exercise Prescription: Initial Exercise Prescription - 05/28/18 1400      Date of Initial Exercise RX and Referring Provider   Date  05/28/18    Referring Provider  Ikonimidis      Treadmill   MPH  2.7    Grade  3    Minutes  15    METs  4.2      Recumbant Bike   Level  6    RPM  60    Watts  50    Minutes  15    METs  4.4      REL-XR   Level  4    Speed  50    Minutes  15    METs  4.4      Prescription Details   Frequency (times per week)  3    Duration  Progress to 45 minutes of aerobic exercise without signs/symptoms of physical distress      Intensity   THRR 40-80% of Max Heartrate  104-141    Ratings of Perceived Exertion  11-13    Perceived Dyspnea  0-4      Resistance Training   Training Prescription  Yes    Weight  3 lb    Reps  10-15       Discharge Exercise Prescription (Final Exercise Prescription Changes): Exercise Prescription Changes - 07/30/18 0800      Response to Exercise   Blood Pressure (Admit)  118/62    Blood Pressure (Exercise)  152/84    Blood Pressure (Exit)  122/62    Heart Rate (Admit)  71 bpm    Heart Rate (Exercise)  112 bpm    Heart Rate (Exit)  84 bpm    Rating of Perceived Exertion (Exercise)  12    Symptoms  none    Duration  Continue with 45 min of aerobic exercise without signs/symptoms of physical distress.    Intensity  THRR unchanged      Progression   Progression   Continue to progress workloads to maintain intensity without signs/symptoms of physical distress.    Average METs  3.9      Resistance Training   Training Prescription  Yes    Weight  7 lb    Reps  10-15      Interval Training   Interval Training  Yes    Equipment  REL-XR;Recumbant Bike      Treadmill   MPH  3.2    Grade  0    Minutes  15    METs  3.44      Recumbant Bike   Level  7    RPM  60    Watts  40    Minutes  15    METs  4.39      Home Exercise Plan   Plans to continue exercise at  Home (comment)   walking   Frequency  Add 2 additional days to  program exercise sessions.    Initial Home Exercises Provided  06/12/18       Functional Capacity: 6 Minute Walk    Row Name 05/28/18 1419 07/23/18 1644       6 Minute Walk   Phase  -  Discharge    Distance  1414 feet  1700 feet    Distance % Change  -  20 %    Distance Feet Change  -  286 ft    Walk Time  6 minutes  6 minutes    # of Rest Breaks  0  0    MPH  2.68  3.22    METS  4.43  4.4    RPE  11  12    Perceived Dyspnea   0  -    VO2 Peak  15.5  15.5    Symptoms  No  No    Resting HR  67 bpm  63 bpm    Resting BP  122/64  126/66    Resting Oxygen Saturation   98 %  -    Exercise Oxygen Saturation  during 6 min walk  99 %  98 %    Max Ex. HR  130 bpm  88 bpm    Max Ex. BP  160/70  162/80    2 Minute Post BP  120/70  -       Quality of Life: Quality of Life - 05/28/18 1407      Quality of Life   Select  Quality of Life      Quality of Life Scores   Health/Function Pre  25.73 %    Socioeconomic Pre  21.88 %    Psych/Spiritual Pre  27.93 %    Family Pre  24 %    GLOBAL Pre  25.04 %       Personal Goals: Goals established at orientation with interventions provided to work toward goal. Personal Goals and Risk Factors at Admission - 05/28/18 1356      Core Components/Risk Factors/Patient Goals on Admission    Weight Management  Yes;Weight Gain    Intervention  Weight Management: Develop a  combined nutrition and exercise program designed to reach desired caloric intake, while maintaining appropriate intake of nutrient and fiber, sodium and fats, and appropriate energy expenditure required for the weight goal.;Weight Management: Provide education and appropriate resources to help participant work on and attain dietary goals.    Admit Weight  144 lb (65.3 kg)    Goal Weight: Short Term  148 lb (67.1 kg)    Goal Weight: Long Term  150 lb (68 kg)    Expected Outcomes  Short Term: Continue to assess and modify interventions until short term weight is achieved;Long Term: Adherence to nutrition and physical activity/exercise program aimed toward attainment of established weight goal;Weight Maintenance: Understanding of the daily nutrition guidelines, which includes 25-35% calories from fat, 7% or less cal from saturated fats, less than 243m cholesterol, less than 1.5gm of sodium, & 5 or more servings of fruits and vegetables daily;Understanding recommendations for meals to include 15-35% energy as protein, 25-35% energy from fat, 35-60% energy from carbohydrates, less than 2029mof dietary cholesterol, 20-35 gm of total fiber daily;Understanding of distribution of calorie intake throughout the day with the consumption of 4-5 meals/snacks;Weight Gain: Understanding of general recommendations for a high calorie, high protein meal plan that promotes weight gain by distributing calorie intake throughout the day with the consumption for 4-5 meals, snacks, and/or  supplements    Tobacco Cessation  Yes   quit 5/24   Intervention  Assist the participant in steps to quit. Provide individualized education and counseling about committing to Tobacco Cessation, relapse prevention, and pharmacological support that can be provided by physician.;Advice worker, assist with locating and accessing local/national Quit Smoking programs, and support quit date choice.    Expected Outcomes  Short Term: Will  demonstrate readiness to quit, by selecting a quit date.;Long Term: Complete abstinence from all tobacco products for at least 12 months from quit date.;Short Term: Will quit all tobacco product use, adhering to prevention of relapse plan.    Hypertension  Yes    Intervention  Provide education on lifestyle modifcations including regular physical activity/exercise, weight management, moderate sodium restriction and increased consumption of fresh fruit, vegetables, and low fat dairy, alcohol moderation, and smoking cessation.;Monitor prescription use compliance.    Expected Outcomes  Long Term: Maintenance of blood pressure at goal levels.;Short Term: Continued assessment and intervention until BP is < 140/35m HG in hypertensive participants. < 130/873mHG in hypertensive participants with diabetes, heart failure or chronic kidney disease.    Lipids  Yes    Intervention  Provide education and support for participant on nutrition & aerobic/resistive exercise along with prescribed medications to achieve LDL <7026mHDL >66m17m  Expected Outcomes  Short Term: Participant states understanding of desired cholesterol values and is compliant with medications prescribed. Participant is following exercise prescription and nutrition guidelines.;Long Term: Cholesterol controlled with medications as prescribed, with individualized exercise RX and with personalized nutrition plan. Value goals: LDL < 70mg27mL > 40 mg.        Personal Goals Discharge: Goals and Risk Factor Review - 07/17/18 0848      Core Components/Risk Factors/Patient Goals Review   Personal Goals Review  Lipids;Hypertension;Other;Stress    Review  AnthoRobifelt good at work , evrything is going good for him.  He does know he needs to cut back on junk food and eating out.  We discussed ways to add more fruits and vegetables to his diet.  he does walk for up to an hour on Saturdays.       Expected Outcomes  Short - AnthoCanon reduce the  amount he eats out and add more fruits and vegetables Long - maintain exercise on his own       Exercise Goals and Review: Exercise Goals    Row Name 05/28/18 1419             Exercise Goals   Increase Physical Activity  Yes       Intervention  Provide advice, education, support and counseling about physical activity/exercise needs.;Develop an individualized exercise prescription for aerobic and resistive training based on initial evaluation findings, risk stratification, comorbidities and participant's personal goals.       Expected Outcomes  Short Term: Attend rehab on a regular basis to increase amount of physical activity.;Long Term: Add in home exercise to make exercise part of routine and to increase amount of physical activity.;Long Term: Exercising regularly at least 3-5 days a week.       Increase Strength and Stamina  Yes       Intervention  Provide advice, education, support and counseling about physical activity/exercise needs.;Develop an individualized exercise prescription for aerobic and resistive training based on initial evaluation findings, risk stratification, comorbidities and participant's personal goals.       Expected Outcomes  Short Term: Increase workloads from initial  exercise prescription for resistance, speed, and METs.;Short Term: Perform resistance training exercises routinely during rehab and add in resistance training at home;Long Term: Improve cardiorespiratory fitness, muscular endurance and strength as measured by increased METs and functional capacity (6MWT)       Able to understand and use rate of perceived exertion (RPE) scale  Yes       Intervention  Provide education and explanation on how to use RPE scale       Expected Outcomes  Short Term: Able to use RPE daily in rehab to express subjective intensity level;Long Term:  Able to use RPE to guide intensity level when exercising independently       Able to understand and use Dyspnea scale  Yes        Intervention  Provide education and explanation on how to use Dyspnea scale       Expected Outcomes  Short Term: Able to use Dyspnea scale daily in rehab to express subjective sense of shortness of breath during exertion;Long Term: Able to use Dyspnea scale to guide intensity level when exercising independently       Knowledge and understanding of Target Heart Rate Range (THRR)  Yes       Intervention  Provide education and explanation of THRR including how the numbers were predicted and where they are located for reference       Expected Outcomes  Short Term: Able to state/look up THRR;Short Term: Able to use daily as guideline for intensity in rehab;Long Term: Able to use THRR to govern intensity when exercising independently       Able to check pulse independently  Yes       Intervention  Provide education and demonstration on how to check pulse in carotid and radial arteries.;Review the importance of being able to check your own pulse for safety during independent exercise       Expected Outcomes  Short Term: Able to explain why pulse checking is important during independent exercise;Long Term: Able to check pulse independently and accurately       Understanding of Exercise Prescription  Yes       Intervention  Provide education, explanation, and written materials on patient's individual exercise prescription       Expected Outcomes  Short Term: Able to explain program exercise prescription;Long Term: Able to explain home exercise prescription to exercise independently          Nutrition & Weight - Outcomes: Pre Biometrics - 05/28/18 1417      Pre Biometrics   Height  _0  (1.676 m)    Weight  144 lb (65.3 kg)    Waist Circumference  30.5 inches    Hip Circumference  37.5 inches    Waist to Hip Ratio  0.81 %    BMI (Calculated)  23.25    Single Leg Stand  14.5 seconds      Post Biometrics - 07/23/18 1646       Post  Biometrics   Height  _1  (1.676 m)    Weight  146 lb (66.2 kg)     Waist Circumference  32.5 inches    Hip Circumference  37 inches    Waist to Hip Ratio  0.88 %    BMI (Calculated)  23.58    Single Leg Stand  30 seconds       Nutrition: Nutrition Therapy & Goals - 06/08/18 0954      Nutrition Therapy   Diet  TLC  Protein (specify units)  10oz    Fiber  30 grams    Whole Grain Foods  3 servings   does not always choose whole grains   Saturated Fats  16 max. grams    Fruits and Vegetables  5 servings/day   8 ideal, does not eat fruit regularly   Sodium  2000 grams      Personal Nutrition Goals   Nutrition Goal  Include fruits as snacks between meals to both increase daily intake of fruits and to choose more nutritious snack options    Personal Goal #2  Work to decrease sugar sweetened beverage consumption; for example, choose PowerAde Zero rather than regular PowerAde    Personal Goal #3  Choose low-sugar cereal options. A low-sugar option will have 10g of sugar or less per serving    Personal Goal #4  Practice reading nutrition facts labels to identify foods high and low in sodium    Comments  He currently chooses processed and high sugar snack options, consumes sugar sweetened beverages IE sweet tea and soda daily, chooses some fried foods, and does not eat fruits other than bananas regularly.      Intervention Plan   Intervention  Prescribe, educate and counsel regarding individualized specific dietary modifications aiming towards targeted core components such as weight, hypertension, lipid management, diabetes, heart failure and other comorbidities.;Nutrition handout(s) given to patient.   low sodium nutrition therapy   Expected Outcomes  Short Term Goal: Understand basic principles of dietary content, such as calories, fat, sodium, cholesterol and nutrients.;Short Term Goal: A plan has been developed with personal nutrition goals set during dietitian appointment.;Long Term Goal: Adherence to prescribed nutrition plan.       Nutrition  Discharge: Nutrition Assessments - 05/28/18 1359      MEDFICTS Scores   Pre Score  45       Education Questionnaire Score: Knowledge Questionnaire Score - 05/28/18 1411      Knowledge Questionnaire Score   Pre Score  19/26   correct answers reviewed with Elberta Fortis, focus on exercise and nutrition      Goals reviewed with patient; copy given to patient.

## 2018-08-05 NOTE — Progress Notes (Signed)
Cardiac Individual Treatment Plan  Patient Details  Name: Mark Mack MRN: [id number] Date of Birth: 1956/02/21 Referring Provider:     Cardiac Rehab from 05/28/2018 in [facility] Cardiac and Pulmonary Rehab  Referring Provider  Ikonimidis      Initial Encounter Date:    Cardiac Rehab from 05/28/2018 in [facility] Cardiac and Pulmonary Rehab  Date  05/28/18      Visit Diagnosis: S/P CABG x 4  Patient's Home Medications on Admission:  Current Outpatient Medications:  .  acetaminophen (TYLENOL) 500 MG tablet, Take 1,000 mg by mouth every 6 (six) hours as needed. , Disp: , Rfl:  .  aspirin EC 81 MG tablet, Take 81 mg by mouth daily. , Disp: , Rfl:  .  docusate sodium (COLACE) 100 MG capsule, Take 1 capsule (100 mg total) by mouth daily. (Patient not taking: Reported on 05/28/2018), Disp: 10 capsule, Rfl: 0 .  esomeprazole (NEXIUM) 20 MG packet, Take 20 mg by mouth daily before breakfast. , Disp: , Rfl:  .  ezetimibe (ZETIA) 10 MG tablet, Take 10 mg by mouth daily., Disp: , Rfl: 2 .  ferrous sulfate 325 (65 FE) MG EC tablet, Take 1 tablet (325 mg total) by mouth daily with breakfast. (Patient not taking: Reported on 05/28/2018), Disp: 60 tablet, Rfl: 3 .  furosemide (LASIX) 20 MG tablet, Take 20 mg by mouth daily., Disp: , Rfl: 0 .  metoprolol succinate (TOPROL XL) 50 MG 24 hr tablet, Take 50 mg by mouth daily. , Disp: , Rfl:  .  ranitidine (ZANTAC) 150 MG tablet, , Disp: , Rfl:  .  rosuvastatin (CRESTOR) 20 MG tablet, Take 1 tablet (20 mg total) by mouth at bedtime. (Patient not taking: Reported on 05/28/2018), Disp: 30 tablet, Rfl: 0 .  senna-docusate (SENOKOT-S) 8.6-50 MG tablet, Take 1 tablet by mouth at bedtime as needed for mild constipation. (Patient not taking: Reported on 05/28/2018), Disp: , Rfl:   Past Medical History: Past Medical History:  Diagnosis Date  . Hypertension     Tobacco Use: Social History   Tobacco Use  Smoking Status Former Smoker  . Last attempt to quit: 03/23/2018  . Years  since quitting: 0.3  Smokeless Tobacco Never Used    Labs: Recent Review Flowsheet Data    There is no flowsheet data to display.       Exercise Target Goals: Exercise Program Goal: Individual exercise prescription set using results from initial 6 min walk test and THRR while considering  patient's activity barriers and safety.   Exercise Prescription Goal: Initial exercise prescription builds to 30-45 minutes a day of aerobic activity, 2-3 days per week.  Home exercise guidelines will be given to patient during program as part of exercise prescription that the participant will acknowledge.  Activity Barriers & Risk Stratification: Activity Barriers & Cardiac Risk Stratification - 05/28/18 1413      Activity Barriers & Cardiac Risk Stratification   Activity Barriers  Arthritis;Right Hip Replacement;Joint Problems   Needs Left Hip Replacement   Cardiac Risk Stratification  High       6 Minute Walk: 6 Minute Walk    Row Name 05/28/18 1419 07/23/18 1644       6 Minute Walk   Phase  -  Discharge    Distance  1414 feet  1700 feet    Distance % Change  -  20 %    Distance Feet Change  -  286 ft    Walk Time  6 minutes  6 minutes    # of Rest Breaks  0  0    MPH  2.68  3.22    METS  4.43  4.4    RPE  11  12    Perceived Dyspnea   0  -    VO2 Peak  15.5  15.5    Symptoms  No  No    Resting HR  67 bpm  63 bpm    Resting BP  122/64  126/66    Resting Oxygen Saturation   98 %  -    Exercise Oxygen Saturation  during 6 min walk  99 %  98 %    Max Ex. HR  130 bpm  88 bpm    Max Ex. BP  160/70  162/80    2 Minute Post BP  120/70  -       Oxygen Initial Assessment:   Oxygen Re-Evaluation:   Oxygen Discharge (Final Oxygen Re-Evaluation):   Initial Exercise Prescription: Initial Exercise Prescription - 05/28/18 1400      Date of Initial Exercise RX and Referring Provider   Date  05/28/18    Referring Provider  Ikonimidis      Treadmill   MPH  2.7    Grade  3     Minutes  15    METs  4.2      Recumbant Bike   Level  6    RPM  60    Watts  50    Minutes  15    METs  4.4      REL-XR   Level  4    Speed  50    Minutes  15    METs  4.4      Prescription Details   Frequency (times per week)  3    Duration  Progress to 45 minutes of aerobic exercise without signs/symptoms of physical distress      Intensity   THRR 40-80% of Max Heartrate  104-141    Ratings of Perceived Exertion  11-13    Perceived Dyspnea  0-4      Resistance Training   Training Prescription  Yes    Weight  3 lb    Reps  10-15       Perform Capillary Blood Glucose checks as needed.  Exercise Prescription Changes: Exercise Prescription Changes    Row Name 05/28/18 1400 06/02/18 1600 06/12/18 0800 06/15/18 1100 07/02/18 0900     Response to Exercise   Blood Pressure (Admit)  122/64  134/70  -  128/60  122/80   Blood Pressure (Exercise)  160/70  200/90 180/80  -  178/86  140/70   Blood Pressure (Exit)  120/70  112/66  -  124/72  114/66   Heart Rate (Admit)  62 bpm  67 bpm  -  80 bpm  82 bpm   Heart Rate (Exercise)  130 bpm  119 bpm  -  126 bpm  109 bpm   Heart Rate (Exit)  60 bpm  85 bpm  -  81 bpm  71 bpm   Oxygen Saturation (Admit)  98 %  -  -  -  -   Oxygen Saturation (Exercise)  99 %  -  -  -  -   Rating of Perceived Exertion (Exercise)  11  13  -  12  12   Symptoms  -  none  -  none  none   Comments  -  first  full day of exercise  -  -  -   Duration  -  Progress to 45 minutes of aerobic exercise without signs/symptoms of physical distress  -  Continue with 45 min of aerobic exercise without signs/symptoms of physical distress.  Continue with 45 min of aerobic exercise without signs/symptoms of physical distress.   Intensity  -  THRR unchanged  -  THRR unchanged  THRR unchanged     Progression   Progression  -  Continue to progress workloads to maintain intensity without signs/symptoms of physical distress.  -  Continue to progress workloads to maintain  intensity without signs/symptoms of physical distress.  Continue to progress workloads to maintain intensity without signs/symptoms of physical distress.   Average METs  -  4.26  -  3.97  4.3     Resistance Training   Training Prescription  -  Yes  -  Yes  Yes   Weight  -  4 lbs  -  5 lbs  5 lb   Reps  -  10-15  -  10-15  10-15     Interval Training   Interval Training  -  No  -  No  No     Treadmill   MPH  -  2  -  2.7  3   Grade  -  5  -  1  1   Minutes  -  15  -  15  15   METs  -  3.91  -  3.44  3.71     Recumbant Bike   Level  -  -  -  6  7   Watts  -  -  -  36  50   Minutes  -  -  -  15  15   METs  -  -  -  3.69  4.18     REL-XR   Level  -  4  -  4  -   Minutes  -  15  -  15  -   METs  -  4.6  -  4.8  -     Home Exercise Plan   Plans to continue exercise at  -  -  Home (comment) walking  Home (comment) walking  Home (comment) walking   Frequency  -  -  Add 2 additional days to program exercise sessions.  Add 2 additional days to program exercise sessions.  Add 2 additional days to program exercise sessions.   Initial Home Exercises Provided  -  -  06/12/18  06/12/18  06/12/18   Row Name 07/15/18 1400 07/30/18 0800           Response to Exercise   Blood Pressure (Admit)  112/60  118/62      Blood Pressure (Exercise)  152/78  152/84      Blood Pressure (Exit)  108/60  122/62      Heart Rate (Admit)  61 bpm  71 bpm      Heart Rate (Exercise)  118 bpm  112 bpm      Heart Rate (Exit)  92 bpm  84 bpm      Rating of Perceived Exertion (Exercise)  12  12      Symptoms  none  none      Duration  Continue with 45 min of aerobic exercise without signs/symptoms of physical distress.  Continue with 45 min of aerobic exercise without signs/symptoms of physical distress.  Intensity  THRR unchanged  THRR unchanged        Progression   Progression  Continue to progress workloads to maintain intensity without signs/symptoms of physical distress.  Continue to progress  workloads to maintain intensity without signs/symptoms of physical distress.      Average METs  4.4  3.9        Resistance Training   Training Prescription  Yes  Yes      Weight  5 lb  7 lb      Reps  10-15  10-15        Interval Training   Interval Training  No  Yes      Equipment  -  REL-XR;Recumbant Bike        Treadmill   MPH  -  3.2      Grade  -  0      Minutes  -  15      METs  -  3.44        Recumbant Bike   Level  7  7      RPM  60  60      Watts  55  40      Minutes  15  15      METs  4.32  4.39        REL-XR   Level  5  -      Speed  50  -      Minutes  15  -      METs  4.5  -        Home Exercise Plan   Plans to continue exercise at  Home (comment) walking  Home (comment) walking      Frequency  Add 2 additional days to program exercise sessions.  Add 2 additional days to program exercise sessions.      Initial Home Exercises Provided  06/12/18  06/12/18         Exercise Comments: Exercise Comments    Row Name 06/01/18 0809 07/23/18 1647         Exercise Comments   First full day of exercise!  Patient was oriented to gym and equipment including functions, settings, policies, and procedures.  Patient's individual exercise prescription and treatment plan were reviewed.  All starting workloads were established based on the results of the 6 minute walk test done at initial orientation visit.  The plan for exercise progression was also introduced and progression will be customized based on patient's performance and goals.  6 min walk assessment done today. See 6 min walk data for detailed results. Results discussed with patient.          Exercise Goals and Review: Exercise Goals    Row Name 05/28/18 1419             Exercise Goals   Increase Physical Activity  Yes       Intervention  Provide advice, education, support and counseling about physical activity/exercise needs.;Develop an individualized exercise prescription for aerobic and resistive  training based on initial evaluation findings, risk stratification, comorbidities and participant's personal goals.       Expected Outcomes  Short Term: Attend rehab on a regular basis to increase amount of physical activity.;Long Term: Add in home exercise to make exercise part of routine and to increase amount of physical activity.;Long Term: Exercising regularly at least 3-5 days a week.       Increase Strength and Stamina  Yes  Intervention  Provide advice, education, support and counseling about physical activity/exercise needs.;Develop an individualized exercise prescription for aerobic and resistive training based on initial evaluation findings, risk stratification, comorbidities and participant's personal goals.       Expected Outcomes  Short Term: Increase workloads from initial exercise prescription for resistance, speed, and METs.;Short Term: Perform resistance training exercises routinely during rehab and add in resistance training at home;Long Term: Improve cardiorespiratory fitness, muscular endurance and strength as measured by increased METs and functional capacity (6MWT)       Able to understand and use rate of perceived exertion (RPE) scale  Yes       Intervention  Provide education and explanation on how to use RPE scale       Expected Outcomes  Short Term: Able to use RPE daily in rehab to express subjective intensity level;Long Term:  Able to use RPE to guide intensity level when exercising independently       Able to understand and use Dyspnea scale  Yes       Intervention  Provide education and explanation on how to use Dyspnea scale       Expected Outcomes  Short Term: Able to use Dyspnea scale daily in rehab to express subjective sense of shortness of breath during exertion;Long Term: Able to use Dyspnea scale to guide intensity level when exercising independently       Knowledge and understanding of Target Heart Rate Range (THRR)  Yes       Intervention  Provide education  and explanation of THRR including how the numbers were predicted and where they are located for reference       Expected Outcomes  Short Term: Able to state/look up THRR;Short Term: Able to use daily as guideline for intensity in rehab;Long Term: Able to use THRR to govern intensity when exercising independently       Able to check pulse independently  Yes       Intervention  Provide education and demonstration on how to check pulse in carotid and radial arteries.;Review the importance of being able to check your own pulse for safety during independent exercise       Expected Outcomes  Short Term: Able to explain why pulse checking is important during independent exercise;Long Term: Able to check pulse independently and accurately       Understanding of Exercise Prescription  Yes       Intervention  Provide education, explanation, and written materials on patient's individual exercise prescription       Expected Outcomes  Short Term: Able to explain program exercise prescription;Long Term: Able to explain home exercise prescription to exercise independently          Exercise Goals Re-Evaluation : Exercise Goals Re-Evaluation    Row Name 06/01/18 0809 06/12/18 0836 06/15/18 1150 07/02/18 0948 07/15/18 1442     Exercise Goal Re-Evaluation   Exercise Goals Review  Increase Physical Activity;Increase Strength and Stamina;Able to understand and use rate of perceived exertion (RPE) scale;Able to check pulse independently;Knowledge and understanding of Target Heart Rate Range (THRR);Understanding of Exercise Prescription  Increase Physical Activity;Able to understand and use rate of perceived exertion (RPE) scale;Knowledge and understanding of Target Heart Rate Range (THRR);Understanding of Exercise Prescription;Increase Strength and Stamina;Able to understand and use Dyspnea scale;Able to check pulse independently  Increase Physical Activity;Increase Strength and Stamina;Understanding of Exercise  Prescription  Increase Physical Activity;Increase Strength and Stamina;Able to understand and use rate of perceived exertion (RPE) scale;Knowledge and understanding of  Target Heart Rate Range (THRR)  Increase Physical Activity;Increase Strength and Stamina;Able to understand and use rate of perceived exertion (RPE) scale;Knowledge and understanding of Target Heart Rate Range (THRR);Understanding of Exercise Prescription   Comments  Reviewed RPE scale, THR and program prescription with pt today.  Pt voiced understanding and was given a copy of goals to take home.   Reviewed home exercise with pt today.  Pt plans to walk at home and at track [facility] for exercise.  Reviewed THR, pulse, RPE, sign and symptoms, NTG use, and when to call 911 or MD.  Also discussed weather considerations and indoor options.  Pt voiced understanding.  Mark Mack is off to a good start in rehab.  Mark Mack is already up to 2.7 mph on the treadmill.  We will continue to monitor his progress.   Mark Mack is making steady progress and has increased MET level.  Staff will discuss interval training with Mark Mack.  Mark Mack has progressed well and has increased overall MET level slightly.  Staff will introduce interval training at next session.   Expected Outcomes  Short: Use RPE daily to regulate intensity. Long: Follow program prescription in THR.  Short: Start to walk on his off days.  Long: Continue to exercise independently  Short: Continue to work on increasing workloads. Long: Continue to exercise on off days.   Short - add interval training Long - increase overall MET level  Short - add intervals to program Long - improve METs from current level   Row Name 07/30/18 0827             Exercise Goal Re-Evaluation   Exercise Goals Review  Increase Physical Activity;Increase Strength and Stamina;Able to understand and use rate of perceived exertion (RPE) scale;Knowledge and understanding of Target Heart Rate Range (THRR);Understanding of  Exercise Prescription       Comments  Mark Mack completed his post 6 min walk and improved by 286 feet, 20 % more than first walk.       Expected Outcomes  Short - complete HT Long - maintain exercise on his own          Discharge Exercise Prescription (Final Exercise Prescription Changes): Exercise Prescription Changes - 07/30/18 0800      Response to Exercise   Blood Pressure (Admit)  118/62    Blood Pressure (Exercise)  152/84    Blood Pressure (Exit)  122/62    Heart Rate (Admit)  71 bpm    Heart Rate (Exercise)  112 bpm    Heart Rate (Exit)  84 bpm    Rating of Perceived Exertion (Exercise)  12    Symptoms  none    Duration  Continue with 45 min of aerobic exercise without signs/symptoms of physical distress.    Intensity  THRR unchanged      Progression   Progression  Continue to progress workloads to maintain intensity without signs/symptoms of physical distress.    Average METs  3.9      Resistance Training   Training Prescription  Yes    Weight  7 lb    Reps  10-15      Interval Training   Interval Training  Yes    Equipment  REL-XR;Recumbant Bike      Treadmill   MPH  3.2    Grade  0    Minutes  15    METs  3.44      Recumbant Bike   Level  7    RPM  60    Watts  40    Minutes  15    METs  4.39      Home Exercise Plan   Plans to continue exercise at  Home (comment)   walking   Frequency  Add 2 additional days to program exercise sessions.    Initial Home Exercises Provided  06/12/18       Nutrition:  Target Goals: Understanding of nutrition guidelines, daily intake of sodium <1555m, cholesterol <2034m calories 30% from fat and 7% or less from saturated fats, daily to have 5 or more servings of fruits and vegetables.  Biometrics: Pre Biometrics - 05/28/18 1417      Pre Biometrics   Height  _0  (1.676 m)    Weight  144 lb (65.3 kg)    Waist Circumference  30.5 inches    Hip Circumference  37.5 inches    Waist to Hip Ratio  0.81 %    BMI  (Calculated)  23.25    Single Leg Stand  14.5 seconds      Post Biometrics - 07/23/18 1646       Post  Biometrics   Height  _1  (1.676 m)    Weight  146 lb (66.2 kg)    Waist Circumference  32.5 inches    Hip Circumference  37 inches    Waist to Hip Ratio  0.88 %    BMI (Calculated)  23.58    Single Leg Stand  30 seconds       Nutrition Therapy Plan and Nutrition Goals: Nutrition Therapy & Goals - 06/08/18 0954      Nutrition Therapy   Diet  TLC    Protein (specify units)  10oz    Fiber  30 grams    Whole Grain Foods  3 servings   does not always choose whole grains   Saturated Fats  16 max. grams    Fruits and Vegetables  5 servings/day   8 ideal, does not eat fruit regularly   Sodium  2000 grams      Personal Nutrition Goals   Nutrition Goal  Include fruits as snacks between meals to both increase daily intake of fruits and to choose more nutritious snack options    Personal Goal #2  Work to decrease sugar sweetened beverage consumption; for example, choose PowerAde Zero rather than regular PowerAde    Personal Goal #3  Choose low-sugar cereal options. A low-sugar option will have 10g of sugar or less per serving    Personal Goal #4  Practice reading nutrition facts labels to identify foods high and low in sodium    Comments  Mark Mack currently chooses processed and high sugar snack options, consumes sugar sweetened beverages IE sweet tea and soda daily, chooses some fried foods, and does not eat fruits other than bananas regularly.      Intervention Plan   Intervention  Prescribe, educate and counsel regarding individualized specific dietary modifications aiming towards targeted core components such as weight, hypertension, lipid management, diabetes, heart failure and other comorbidities.;Nutrition handout(s) given to patient.   low sodium nutrition therapy   Expected Outcomes  Short Term Goal: Understand basic principles of dietary content, such as calories, fat, sodium,  cholesterol and nutrients.;Short Term Goal: A plan has been developed with personal nutrition goals set during dietitian appointment.;Long Term Goal: Adherence to prescribed nutrition plan.       Nutrition Assessments: Nutrition Assessments - 05/28/18 1359      MEDFICTS Scores  Pre Score  45       Nutrition Goals Re-Evaluation: Nutrition Goals Re-Evaluation    Mark Mack Name 06/08/18 1005 07/30/18 0914           Goals   Nutrition Goal  Include fruits as snacks between meals to both increase daily intake of fruits and to choose more nutritious snack options  Include fruits as snacks to both include more servings of fruit in the diet and to swap out less nutritious/ more processed snack options; work to decrease sources of added sugar such as cereal and sugar sweetened beverages; read food labels more regularly      Comment  Mark Mack does not consume fruits regularly other than bananas and chooses snacks like chips, little debby's, chocolate covered peanuts, and oatmeal cream pies  Mark Mack feels Mark Mack is improving his diet. Mark Mack is choosing more nutritious snacks, reading labels for lower salt options IE low salt chips, has decreased soda intake significantly (still drinks PowerAde occasionally), eating less fried food, and eating more beans/ vegetables/ fruits. Mark Mack is finding it challening to lower red meat intake      Expected Outcome  Mark Mack will choose fruits as snacks more regularly, and also incorporate other snacks with greater nutritional value such as [employer] bars or trail mix  Mark Mack will continue to read food labels regularly, choose lower sodium and sugar options, and continue to work on reducing intake of red meats. Mark Mack will swap more of his processed snacks for whole food - based choices        Personal Goal #2 Re-Evaluation   Personal Goal #2  Work to decrease sugar sweetened beverage consumption; for example choose PowerAde Zero rather than regular PoweraAde  -        Personal Goal #3 Re-Evaluation    Personal Goal #3  Choose low-sugar cereal options. A low sugar option will have 10g of sugar or less per serving  -        Personal Goal #4 Re-Evaluation   Personal Goal #4  Practice reading nutrition facts labels to identify foods high and low in sodium  -         Nutrition Goals Discharge (Final Nutrition Goals Re-Evaluation): Nutrition Goals Re-Evaluation - 07/30/18 0914      Goals   Nutrition Goal  Include fruits as snacks to both include more servings of fruit in the diet and to swap out less nutritious/ more processed snack options; work to decrease sources of added sugar such as cereal and sugar sweetened beverages; read food labels more regularly    Comment  Mark Mack feels Mark Mack is improving his diet. Mark Mack is choosing more nutritious snacks, reading labels for lower salt options IE low salt chips, has decreased soda intake significantly (still drinks PowerAde occasionally), eating less fried food, and eating more beans/ vegetables/ fruits. Mark Mack is finding it challening to lower red meat intake    Expected Outcome  Mark Mack will continue to read food labels regularly, choose lower sodium and sugar options, and continue to work on reducing intake of red meats. Mark Mack will swap more of his processed snacks for whole food - based choices       Psychosocial: Target Goals: Acknowledge presence or absence of significant depression and/or stress, maximize coping skills, provide positive support system. Participant is able to verbalize types and ability to use techniques and skills needed for reducing stress and depression.   Initial Review & Psychosocial Screening: Initial Psych Review & Screening - 05/28/18 1359  Initial Review   Current issues with  Current Stress Concerns    Comments  Returning to work the end of August. Works painting car parts in the heat, long hours. Mark Mack knows Mark Mack needs to take it easy returning to work. Mark Mack is ready to get back to his normal routine.       Family Dynamics   Good  Support System?  Yes   Wife, family     Barriers   Psychosocial barriers to participate in program  There are no identifiable barriers or psychosocial needs.;The patient should benefit from training in stress management and relaxation.      Screening Interventions   Interventions  Program counselor consult;To provide support and resources with identified psychosocial needs;Encouraged to exercise;Provide feedback about the scores to participant    Expected Outcomes  Short Term goal: Utilizing psychosocial counselor, staff and physician to assist with identification of specific Stressors or current issues interfering with healing process. Setting desired goal for each stressor or current issue identified.;Long Term Goal: Stressors or current issues are controlled or eliminated.;Short Term goal: Identification and review with participant of any Quality of Life or Depression concerns found by scoring the questionnaire.;Long Term goal: The participant improves quality of Life and PHQ9 Scores as seen by post scores and/or verbalization of changes       Quality of Life Scores:  Quality of Life - 05/28/18 1407      Quality of Life   Select  Quality of Life      Quality of Life Scores   Health/Function Pre  25.73 %    Socioeconomic Pre  21.88 %    Psych/Spiritual Pre  27.93 %    Family Pre  24 %    GLOBAL Pre  25.04 %      Scores of 19 and below usually indicate a poorer quality of life in these areas.  A difference of  2-3 points is a clinically meaningful difference.  A difference of 2-3 points in the total score of the Quality of Life Index has been associated with significant improvement in overall quality of life, self-image, physical symptoms, and general health in studies assessing change in quality of life.  PHQ-9: Recent Review Flowsheet Data    Depression screen [facility] 2/9 05/28/2018   Decreased Interest 2   Down, Depressed, Hopeless 0   PHQ - 2 Score 2   Altered sleeping 0   Tired,  decreased energy 0   Change in appetite 1   Feeling bad or failure about yourself  0   Trouble concentrating 0   Moving slowly or fidgety/restless 0   Suicidal thoughts 0   PHQ-9 Score 3   Difficult doing work/chores Not difficult at all     Interpretation of Total Score  Total Score Depression Severity:  1-4 = Minimal depression, 5-9 = Mild depression, 10-14 = Moderate depression, 15-19 = Moderately severe depression, 20-27 = Severe depression   Psychosocial Evaluation and Intervention: Psychosocial Evaluation - 06/10/18 0911      Psychosocial Evaluation & Interventions   Interventions  Relaxation education;Stress management education;Encouraged to exercise with the program and follow exercise prescription    Comments  Counselor met with Mark Mack) today for initial psychosocial evaluation.  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  [place] has a strong support system with a sposue of 20 years; an adult daughter and son who live locally and active involvement in his local church.  Mark Mack  reports being in fairly good health other than his heart with the exception of the need for hip replacement in the future.  Mark Mack states Mark Mack sleeps well and has a good appetite.  Mark Mack denies a history of depression or anxiety or any current symptoms and Mark Mack is typically in a positive mood.  Mark Mack says his primary stressors are being out of work currently and his health.  Mark Mack will return to work on 8/26.  His goals for this program are to lose weight; get back to work; and continue to increase his stamina and energy.  Staff will follow with Mark Mack.     Expected Outcomes  Short:  Mark Mack will continue exercising for his health and stress management.  Mark Mack will attend the psychoeducational components of this program to learn more strategies to manage stress and set healthier limits on his work hours.  Long:  Mark Mack will continue a routine of exercise and positive self-care upon completion of this  program.      Continue Psychosocial Services   Follow up required by staff       Psychosocial Re-Evaluation: Psychosocial Re-Evaluation    [place] Name 06/19/18 [phone] 07/17/18 0851           Psychosocial Re-Evaluation   Current issues with  Current Stress Concerns  Current Stress Concerns      Comments  Mark Mack goes back to work next Monday.  Mark Mack has some stress at home that is normal.  Mark Mack manages stress by exercising and Mark Mack is working on breathing techniques.   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      Expected Outcomes  Short - continue to attend HT Long - manage stress with exrecise and breathing  Short - continue to attend HT to stay fit for work Long - exercise on his own      Interventions  Encouraged to attend Cardiac Rehabilitation for the exercise;Stress management education;Relaxation education  -      Continue Psychosocial Services   Follow up required by staff  -         Psychosocial Discharge (Final Psychosocial Re-Evaluation): Psychosocial Re-Evaluation - 07/17/18 0851      Psychosocial Re-Evaluation   Current issues with  Current Stress Concerns    Comments  Mark Mack was concerned 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    Expected Outcomes  Short - continue to attend HT to stay fit for work Long - exercise on his own       Vocational Rehabilitation: Provide vocational rehab assistance to qualifying candidates.   Vocational Rehab Evaluation & Intervention: Vocational Rehab - 05/28/18 1412      Initial Vocational Rehab Evaluation & Intervention   Assessment shows need for Vocational Rehabilitation  No       Education: Education Goals: Education classes will be provided on a variety of topics geared toward better understanding of heart health and risk factor modification. Participant will state understanding/return demonstration of topics presented as noted by education test  scores.  Learning Barriers/Preferences: Learning Barriers/Preferences - 05/28/18 1410      Learning Barriers/Preferences   Learning Barriers  Exercise Concerns    Learning Preferences  Computer/Internet;Verbal Instruction;Group Instruction;Skilled Demonstration;Video       Education Topics:  AED/CPR: - Group verbal and written instruction with the use of models to demonstrate the basic use of the AED with the basic ABC's of resuscitation.  Cardiac Rehab from 08/03/2018 in [facility] Cardiac and Pulmonary Rehab  Date  07/13/18  Educator  CE  Instruction Review Code  1- Verbalizes Understanding      General Nutrition Guidelines/Fats and Fiber: -Group instruction provided by verbal, written material, models and posters to present the general guidelines for heart healthy nutrition. Gives an explanation and review of dietary fats and fiber.   Cardiac Rehab from 08/03/2018 in [facility] Cardiac and Pulmonary Rehab  Date  07/06/18  Educator  LB  Instruction Review Code  1- Verbalizes Understanding      Controlling Sodium/Reading Food Labels: -Group verbal and written material supporting the discussion of sodium use in heart healthy nutrition. Review and explanation with models, verbal and written materials for utilization of the food label.   Cardiac Rehab from 08/03/2018 in [facility] Cardiac and Pulmonary Rehab  Date  07/15/18  Educator  LB  Instruction Review Code  1- Verbalizes Understanding      Exercise Physiology & General Exercise Guidelines: - Group verbal and written instruction with models to review the exercise physiology of the cardiovascular system and associated critical values. Provides general exercise guidelines with specific guidelines to those with heart or lung disease.    Aerobic Exercise & Resistance Training: - Gives group verbal and written instruction on the various components of exercise. Focuses on aerobic and resistive training programs and the benefits of this training  and how to safely progress through these programs..   Cardiac Rehab from 08/03/2018 in [facility] Cardiac and Pulmonary Rehab  Date  06/01/18  Educator  [facility]  Instruction Review Code  1- Verbalizes Understanding      Flexibility, Balance, Mind/Body Relaxation: Provides group verbal/written instruction on the benefits of flexibility and balance training, including mind/body exercise modes such as yoga, pilates and tai chi.  Demonstration and skill practice provided.   Cardiac Rehab from 08/03/2018 in [facility] Cardiac and Pulmonary Rehab  Date  08/03/18  Educator  [facility]  Instruction Review Code  1- Verbalizes Understanding      Stress and Anxiety: - Provides group verbal and written instruction about the health risks of elevated stress and causes of high stress.  Discuss the correlation between heart/lung disease and anxiety and treatment options. Review healthy ways to manage with stress and anxiety.   Cardiac Rehab from 08/03/2018 in [facility] Cardiac and Pulmonary Rehab  Date  06/17/18  Educator  [facility]  Instruction Review Code  1- Verbalizes Understanding      Depression: - Provides group verbal and written instruction on the correlation between heart/lung disease and depressed mood, treatment options, and the stigmas associated with seeking treatment.   Cardiac Rehab from 08/03/2018 in [facility] Cardiac and Pulmonary Rehab  Date  07/29/18  Educator  [facility]  Instruction Review Code  1- Verbalizes Understanding      Anatomy & Physiology of the Heart: - Group verbal and written instruction and models provide basic cardiac anatomy and physiology, with the coronary electrical and arterial systems. Review of Valvular disease and Heart Failure   Cardiac Rehab from 08/03/2018 in [facility] Cardiac and Pulmonary Rehab  Date  06/15/18  Educator  CE  Instruction Review Code  1- Verbalizes Understanding      Cardiac Procedures: - Group verbal and written instruction to review commonly prescribed medications for heart  disease. Reviews the medication, class of the drug, and side effects. Includes the steps to properly store meds and maintain the prescription regimen. (beta blockers and nitrates)   Cardiac Medications I: - Group  verbal and written instruction to review commonly prescribed medications for heart disease. Reviews the medication, class of the drug, and side effects. Includes the steps to properly store meds and maintain the prescription regimen.   Cardiac Rehab from 08/03/2018 in [facility] Cardiac and Pulmonary Rehab  Date  06/22/18  Educator  CE  Instruction Review Code  1- Verbalizes Understanding      Cardiac Medications II: -Group verbal and written instruction to review commonly prescribed medications for heart disease. Reviews the medication, class of the drug, and side effects. (all other drug classes)   Cardiac Rehab from 08/03/2018 in [facility] Cardiac and Pulmonary Rehab  Date  06/10/18  Educator  [place]  Instruction Review Code  1- Verbalizes Understanding       Go Sex-Intimacy & Heart Disease, Get SMART - Goal Setting: - Group verbal and written instruction through game format to discuss heart disease and the return to sexual intimacy. Provides group verbal and written material to discuss and apply goal setting through the application of the S.M.A.R.T. Method.   Other Matters of the Heart: - Provides group verbal, written materials and models to describe Stable Angina and Peripheral Artery. Includes description of the disease process and treatment options available to the cardiac patient.   Cardiac Rehab from 08/03/2018 in [facility] Cardiac and Pulmonary Rehab  Date  06/15/18  Educator  CE  Instruction Review Code  1- Verbalizes Understanding      Exercise & Equipment Safety: - Individual verbal instruction and demonstration of equipment use and safety with use of the equipment.   Cardiac Rehab from 08/03/2018 in [facility] Cardiac and Pulmonary Rehab  Date  05/28/18  Educator  [facility]  Instruction  Review Code  1- Verbalizes Understanding      Infection Prevention: - Provides verbal and written material to individual with discussion of infection control including proper hand washing and proper equipment cleaning during exercise session.   Cardiac Rehab from 08/03/2018 in [facility] Cardiac and Pulmonary Rehab  Date  05/28/18  Educator  [facility]  Instruction Review Code  1- Verbalizes Understanding      Falls Prevention: - Provides verbal and written material to individual with discussion of falls prevention and safety.   Cardiac Rehab from 08/03/2018 in [facility] Cardiac and Pulmonary Rehab  Date  05/28/18  Educator  [facility]  Instruction Review Code  1- Verbalizes Understanding      Diabetes: - Individual verbal and written instruction to review signs/symptoms of diabetes, desired ranges of glucose level fasting, after meals and with exercise. Acknowledge that pre and post exercise glucose checks will be done for 3 sessions at entry of program.   Know Your Numbers and Risk Factors: -Group verbal and written instruction about important numbers in your health.  Discussion of what are risk factors and how they play a role in the disease process.  Review of Cholesterol, Blood Pressure, Diabetes, and BMI and the role they play in your overall health.   Cardiac Rehab from 08/03/2018 in [facility] Cardiac and Pulmonary Rehab  Date  06/10/18  Educator  [place]  Instruction Review Code  1- Verbalizes Understanding      Sleep Hygiene: -Provides group verbal and written instruction about how sleep can affect your health.  Define sleep hygiene, discuss sleep cycles and impact of sleep habits. Review good sleep hygiene tips.    Other: -Provides group and verbal instruction on various topics (see comments)   Knowledge Questionnaire Score: Knowledge Questionnaire Score - 05/28/18 1411  Knowledge Questionnaire Score   Pre Score  19/26   correct answers reviewed with Mark Mack, focus on exercise and nutrition       Core Components/Risk Factors/Patient Goals at Admission: Personal Goals and Risk Factors at Admission - 05/28/18 1356      Core Components/Risk Factors/Patient Goals on Admission    Weight Management  Yes;Weight Gain    Intervention  Weight Management: Develop a combined nutrition and exercise program designed to reach desired caloric intake, while maintaining appropriate intake of nutrient and fiber, sodium and fats, and appropriate energy expenditure required for the weight goal.;Weight Management: Provide education and appropriate resources to help participant work on and attain dietary goals.    Admit Weight  144 lb (65.3 kg)    Goal Weight: Short Term  148 lb (67.1 kg)    Goal Weight: Long Term  150 lb (68 kg)    Expected Outcomes  Short Term: Continue to assess and modify interventions until short term weight is achieved;Long Term: Adherence to nutrition and physical activity/exercise program aimed toward attainment of established weight goal;Weight Maintenance: Understanding of the daily nutrition guidelines, which includes 25-35% calories from fat, 7% or less cal from saturated fats, less than 262m cholesterol, less than 1.5gm of sodium, & 5 or more servings of fruits and vegetables daily;Understanding recommendations for meals to include 15-35% energy as protein, 25-35% energy from fat, 35-60% energy from carbohydrates, less than 2033mof dietary cholesterol, 20-35 gm of total fiber daily;Understanding of distribution of calorie intake throughout the day with the consumption of 4-5 meals/snacks;Weight Gain: Understanding of general recommendations for a high calorie, high protein meal plan that promotes weight gain by distributing calorie intake throughout the day with the consumption for 4-5 meals, snacks, and/or supplements    Tobacco Cessation  Yes   quit 5/24   Intervention  Assist the participant in steps to quit. Provide individualized education and counseling about committing to  Tobacco Cessation, relapse prevention, and pharmacological support that can be provided by physician.;OfAdvice workerassist with locating and accessing local/national Quit Smoking programs, and support quit date choice.    Expected Outcomes  Short Term: Will demonstrate readiness to quit, by selecting a quit date.;Long Term: Complete abstinence from all tobacco products for at least 12 months from quit date.;Short Term: Will quit all tobacco product use, adhering to prevention of relapse plan.    Hypertension  Yes    Intervention  Provide education on lifestyle modifcations including regular physical activity/exercise, weight management, moderate sodium restriction and increased consumption of fresh fruit, vegetables, and low fat dairy, alcohol moderation, and smoking cessation.;Monitor prescription use compliance.    Expected Outcomes  Long Term: Maintenance of blood pressure at goal levels.;Short Term: Continued assessment and intervention until BP is < 140/9041mG in hypertensive participants. < 130/15m44m in hypertensive participants with diabetes, heart failure or chronic kidney disease.    Lipids  Yes    Intervention  Provide education and support for participant on nutrition & aerobic/resistive exercise along with prescribed medications to achieve LDL <70mg58mL >40mg.22mExpected Outcomes  Short Term: Participant states understanding of desired cholesterol values and is compliant with medications prescribed. Participant is following exercise prescription and nutrition guidelines.;Long Term: Cholesterol controlled with medications as prescribed, with individualized exercise RX and with personalized nutrition plan. Value goals: LDL < 70mg, 13m> 40 mg.       Core Components/Risk Factors/Patient Goals Review:  Goals and Risk Factor Review  [place] Name 06/19/18 0998 07/17/18 0848           Core Components/Risk Factors/Patient Goals Review   Personal Goals Review  Weight  Management/Obesity;Lipids;Hypertension  Lipids;Hypertension;Other;Stress      Review  Mark Mack met with the RD and is making healthier choices by reducing fat and sodium.  Mark Mack takes meds as directed.  BP has been in normal limits during HT.  Mark Mack is eating more fruits and vegetables.  Mark Mack feels Mark Mack has better stamina, strength and better mood since starting HT.  Mark Mack has felt good at work , evrything is going good for him.  Mark Mack does know Mark Mack needs to cut back on junk food and eating out.  We discussed ways to add more fruits and vegetables to his diet.  Mark Mack does walk for up to an hour on Saturdays.         Expected Outcomes  Short - Continue to attend and make healthy changes to diet  Long - maintain exercise and dietary habits  Short - Mark Mack will reduce the amount Mark Mack eats out and add more fruits and vegetables Long - maintain exercise on his own         Core Components/Risk Factors/Patient Goals at Discharge (Final Review):  Goals and Risk Factor Review - 07/17/18 0848      Core Components/Risk Factors/Patient Goals Review   Personal Goals Review  Lipids;Hypertension;Other;Stress    Review  Mark Mack has felt good at work , evrything is going good for him.  Mark Mack does know Mark Mack needs to cut back on junk food and eating out.  We discussed ways to add more fruits and vegetables to his diet.  Mark Mack does walk for up to an hour on Saturdays.       Expected Outcomes  Short - Mark Mack will reduce the amount Mark Mack eats out and add more fruits and vegetables Long - maintain exercise on his own       ITP Comments: ITP Comments    [place] Name 05/28/18 1336 06/10/18 0757 07/08/18 0555 08/05/18 0741     ITP Comments  Med Review completed. Initial ITP created. Diagnosis can be found in [facility] 5/29  30 day review completed. ITP sent to Dr. Ramonita Lab, covering for Dr. Emily Filbert, Medical Director of Cardiac Rehab. Continue with ITP unless changes are made by physician.  New to program  30 day review completed. ITP sent to Dr. Emily Filbert, Medical Director of Cardiac Rehab. Continue with ITP unless changes are made by physician  30 day review completed. ITP sent to Dr. Emily Filbert, Medical Director of Cardiac Rehab. Continue with ITP unless changes are made by physician       Comments:

## 2018-08-05 NOTE — Progress Notes (Signed)
Daily Session Note  Patient Details  Name: Mark Mack MRN: [id number] Date of Birth: Mar 29, 1956 Referring Provider:     Cardiac Rehab from 05/28/2018 in [facility] Cardiac and Pulmonary Rehab  Referring Provider  [place]      Encounter Date: 08/05/2018  Check In: Session Check In - 08/05/18 1632      Check-In   Supervising physician immediately available to respond to emergencies  See telemetry face sheet for immediately available ER MD    Location  [facility]-Cardiac & Pulmonary Rehab    Staff Present  Renita Papa, RN BSN;Tenee Wish, RN, Vickki Hearing, BA, ACSM CEP, Exercise Physiologist    Medication changes reported      No    Fall or balance concerns reported     No    Tobacco Cessation  No Change    Warm-up and Cool-down  Performed on first and last piece of equipment    Resistance Training Performed  Yes    VAD Patient?  No    PAD/SET Patient?  No      Pain Assessment   Currently in Pain?  No/denies          Social History   Tobacco Use  Smoking Status Former Smoker  . Last attempt to quit: 03/23/2018  . Years since quitting: 0.3  Smokeless Tobacco Never Used    Goals Met:  Proper associated with RPD/PD & O2 Sat Independence with exercise equipment Exercise tolerated well No report of cardiac concerns or symptoms Strength training completed today  Goals Unmet:  Not Applicable  Comments:     Dr. Emily Filbert is Medical Director for [place] [facility].

## 2018-08-06 ENCOUNTER — Encounter: Payer: Self-pay | Admitting: *Deleted

## 2018-08-06 NOTE — Progress Notes (Signed)
Cardiac Individual Treatment Plan  Patient Details  Name: Mark Mack MRN: [id number] Date of Birth: [birth year] Referring Provider:     Cardiac Rehab from 05/28/2018 in [facility] Cardiac and Pulmonary Rehab  Referring Provider  Ikonimidis      Initial Encounter Date:    Cardiac Rehab from 05/28/2018 in [facility] Cardiac and Pulmonary Rehab  Date  05/28/18      Visit Diagnosis: No diagnosis found.  Patient's Home Medications on Admission:  Current Outpatient Medications:  .  acetaminophen (TYLENOL) 500 MG tablet, Take 1,000 mg by mouth every 6 (six) hours as needed. , Disp: , Rfl:  .  aspirin EC 81 MG tablet, Take 81 mg by mouth daily. , Disp: , Rfl:  .  docusate sodium (COLACE) 100 MG capsule, Take 1 capsule (100 mg total) by mouth daily. (Patient not taking: Reported on 05/28/2018), Disp: 10 capsule, Rfl: 0 .  esomeprazole (NEXIUM) 20 MG packet, Take 20 mg by mouth daily before breakfast. , Disp: , Rfl:  .  ezetimibe (ZETIA) 10 MG tablet, Take 10 mg by mouth daily., Disp: , Rfl: 2 .  ferrous sulfate 325 (65 FE) MG EC tablet, Take 1 tablet (325 mg total) by mouth daily with breakfast. (Patient not taking: Reported on 05/28/2018), Disp: 60 tablet, Rfl: 3 .  furosemide (LASIX) 20 MG tablet, Take 20 mg by mouth daily., Disp: , Rfl: 0 .  metoprolol succinate (TOPROL XL) 50 MG 24 hr tablet, Take 50 mg by mouth daily. , Disp: , Rfl:  .  ranitidine (ZANTAC) 150 MG tablet, , Disp: , Rfl:  .  rosuvastatin (CRESTOR) 20 MG tablet, Take 1 tablet (20 mg total) by mouth at bedtime. (Patient not taking: Reported on 05/28/2018), Disp: 30 tablet, Rfl: 0 .  senna-docusate (SENOKOT-S) 8.6-50 MG tablet, Take 1 tablet by mouth at bedtime as needed for mild constipation. (Patient not taking: Reported on 05/28/2018), Disp: , Rfl:   Past Medical History: Past Medical History:  Diagnosis Date  . Hypertension     Tobacco Use: Social History   Tobacco Use  Smoking Status Former Smoker  . Last attempt to quit: 03/23/2018   . Years since quitting: 0.3  Smokeless Tobacco Never Used    Labs: Recent Review Flowsheet Data    There is no flowsheet data to display.       Exercise Target Goals: Exercise Program Goal: Individual exercise prescription set using results from initial 6 min walk test and THRR while considering  patient's activity barriers and safety.   Exercise Prescription Goal: Initial exercise prescription builds to 30-45 minutes a day of aerobic activity, 2-3 days per week.  Home exercise guidelines will be given to patient during program as part of exercise prescription that the participant will acknowledge.  Activity Barriers & Risk Stratification: Activity Barriers & Cardiac Risk Stratification - 05/28/18 1413      Activity Barriers & Cardiac Risk Stratification   Activity Barriers  Arthritis;Right Hip Replacement;Joint Problems   Needs Left Hip Replacement   Cardiac Risk Stratification  High       6 Minute Walk: 6 Minute Walk    Row Name 05/28/18 1419 07/23/18 1644       6 Minute Walk   Phase  -  Discharge    Distance  1414 feet  1700 feet    Distance % Change  -  20 %    Distance Feet Change  -  286 ft    Walk Time  6 minutes  6  minutes    # of Rest Breaks  0  0    MPH  2.68  3.22    METS  4.43  4.4    RPE  11  12    Perceived Dyspnea   0  -    VO2 Peak  15.5  15.5    Symptoms  No  No    Resting HR  67 bpm  63 bpm    Resting BP  122/64  126/66    Resting Oxygen Saturation   98 %  -    Exercise Oxygen Saturation  during 6 min walk  99 %  98 %    Max Ex. HR  130 bpm  88 bpm    Max Ex. BP  160/70  162/80    2 Minute Post BP  120/70  -       Oxygen Initial Assessment:   Oxygen Re-Evaluation:   Oxygen Discharge (Final Oxygen Re-Evaluation):   Initial Exercise Prescription: Initial Exercise Prescription - 05/28/18 1400      Date of Initial Exercise RX and Referring Provider   Date  05/28/18    Referring Provider  Ikonimidis      Treadmill   MPH  2.7     Grade  3    Minutes  15    METs  4.2      Recumbant Bike   Level  6    RPM  60    Watts  50    Minutes  15    METs  4.4      REL-XR   Level  4    Speed  50    Minutes  15    METs  4.4      Prescription Details   Frequency (times per week)  3    Duration  Progress to 45 minutes of aerobic exercise without signs/symptoms of physical distress      Intensity   THRR 40-80% of Max Heartrate  104-141    Ratings of Perceived Exertion  11-13    Perceived Dyspnea  0-4      Resistance Training   Training Prescription  Yes    Weight  3 lb    Reps  10-15       Perform Capillary Blood Glucose checks as needed.  Exercise Prescription Changes:  Exercise Prescription Changes    Row Name 05/28/18 1400 06/02/18 1600 06/12/18 0800 06/15/18 1100 07/02/18 0900     Response to Exercise   Blood Pressure (Admit)  122/64  134/70  -  128/60  122/80   Blood Pressure (Exercise)  160/70  200/90 180/80  -  178/86  140/70   Blood Pressure (Exit)  120/70  112/66  -  124/72  114/66   Heart Rate (Admit)  62 bpm  67 bpm  -  80 bpm  82 bpm   Heart Rate (Exercise)  130 bpm  119 bpm  -  126 bpm  109 bpm   Heart Rate (Exit)  60 bpm  85 bpm  -  81 bpm  71 bpm   Oxygen Saturation (Admit)  98 %  -  -  -  -   Oxygen Saturation (Exercise)  99 %  -  -  -  -   Rating of Perceived Exertion (Exercise)  11  13  -  12  12   Symptoms  -  none  -  none  none   Comments  -  first  full day of exercise  -  -  -   Duration  -  Progress to 45 minutes of aerobic exercise without signs/symptoms of physical distress  -  Continue with 45 min of aerobic exercise without signs/symptoms of physical distress.  Continue with 45 min of aerobic exercise without signs/symptoms of physical distress.   Intensity  -  THRR unchanged  -  THRR unchanged  THRR unchanged     Progression   Progression  -  Continue to progress workloads to maintain intensity without signs/symptoms of physical distress.  -  Continue to progress workloads to  maintain intensity without signs/symptoms of physical distress.  Continue to progress workloads to maintain intensity without signs/symptoms of physical distress.   Average METs  -  4.26  -  3.97  4.3     Resistance Training   Training Prescription  -  Yes  -  Yes  Yes   Weight  -  4 lbs  -  5 lbs  5 lb   Reps  -  10-15  -  10-15  10-15     Interval Training   Interval Training  -  No  -  No  No     Treadmill   MPH  -  2  -  2.7  3   Grade  -  5  -  1  1   Minutes  -  15  -  15  15   METs  -  3.91  -  3.44  3.71     Recumbant Bike   Level  -  -  -  6  7   Watts  -  -  -  36  50   Minutes  -  -  -  15  15   METs  -  -  -  3.69  4.18     REL-XR   Level  -  4  -  4  -   Minutes  -  15  -  15  -   METs  -  4.6  -  4.8  -     Home Exercise Plan   Plans to continue exercise at  -  -  Home (comment) walking  Home (comment) walking  Home (comment) walking   Frequency  -  -  Add 2 additional days to program exercise sessions.  Add 2 additional days to program exercise sessions.  Add 2 additional days to program exercise sessions.   Initial Home Exercises Provided  -  -  06/12/18  06/12/18  06/12/18   Row Name 07/15/18 1400 07/30/18 0800           Response to Exercise   Blood Pressure (Admit)  112/60  118/62      Blood Pressure (Exercise)  152/78  152/84      Blood Pressure (Exit)  108/60  122/62      Heart Rate (Admit)  61 bpm  71 bpm      Heart Rate (Exercise)  118 bpm  112 bpm      Heart Rate (Exit)  92 bpm  84 bpm      Rating of Perceived Exertion (Exercise)  12  12      Symptoms  none  none      Duration  Continue with 45 min of aerobic exercise without signs/symptoms of physical distress.  Continue with 45 min of aerobic exercise without signs/symptoms of physical distress.  Intensity  THRR unchanged  THRR unchanged        Progression   Progression  Continue to progress workloads to maintain intensity without signs/symptoms of physical distress.  Continue to progress  workloads to maintain intensity without signs/symptoms of physical distress.      Average METs  4.4  3.9        Resistance Training   Training Prescription  Yes  Yes      Weight  5 lb  7 lb      Reps  10-15  10-15        Interval Training   Interval Training  No  Yes      Equipment  -  REL-XR;Recumbant Bike        Treadmill   MPH  -  3.2      Grade  -  0      Minutes  -  15      METs  -  3.44        Recumbant Bike   Level  7  7      RPM  60  60      Watts  55  40      Minutes  15  15      METs  4.32  4.39        REL-XR   Level  5  -      Speed  50  -      Minutes  15  -      METs  4.5  -        Home Exercise Plan   Plans to continue exercise at  Home (comment) walking  Home (comment) walking      Frequency  Add 2 additional days to program exercise sessions.  Add 2 additional days to program exercise sessions.      Initial Home Exercises Provided  06/12/18  06/12/18         Exercise Comments:  Exercise Comments    Row Name 06/01/18 0809 07/23/18 1647         Exercise Comments   First full day of exercise!  Patient was oriented to gym and equipment including functions, settings, policies, and procedures.  Patient's individual exercise prescription and treatment plan were reviewed.  All starting workloads were established based on the results of the 6 minute walk test done at initial orientation visit.  The plan for exercise progression was also introduced and progression will be customized based on patient's performance and goals.  6 min walk assessment done today. See 6 min walk data for detailed results. Results discussed with patient.          Exercise Goals and Review:  Exercise Goals    Row Name 05/28/18 1419             Exercise Goals   Increase Physical Activity  Yes       Intervention  Provide advice, education, support and counseling about physical activity/exercise needs.;Develop an individualized exercise prescription for aerobic and resistive  training based on initial evaluation findings, risk stratification, comorbidities and participant's personal goals.       Expected Outcomes  Short Term: Attend rehab on a regular basis to increase amount of physical activity.;Long Term: Add in home exercise to make exercise part of routine and to increase amount of physical activity.;Long Term: Exercising regularly at least 3-5 days a week.       Increase Strength and Stamina  Yes  Intervention  Provide advice, education, support and counseling about physical activity/exercise needs.;Develop an individualized exercise prescription for aerobic and resistive training based on initial evaluation findings, risk stratification, comorbidities and participant's personal goals.       Expected Outcomes  Short Term: Increase workloads from initial exercise prescription for resistance, speed, and METs.;Short Term: Perform resistance training exercises routinely during rehab and add in resistance training at home;Long Term: Improve cardiorespiratory fitness, muscular endurance and strength as measured by increased METs and functional capacity (6MWT)       Able to understand and use rate of perceived exertion (RPE) scale  Yes       Intervention  Provide education and explanation on how to use RPE scale       Expected Outcomes  Short Term: Able to use RPE daily in rehab to express subjective intensity level;Long Term:  Able to use RPE to guide intensity level when exercising independently       Able to understand and use Dyspnea scale  Yes       Intervention  Provide education and explanation on how to use Dyspnea scale       Expected Outcomes  Short Term: Able to use Dyspnea scale daily in rehab to express subjective sense of shortness of breath during exertion;Long Term: Able to use Dyspnea scale to guide intensity level when exercising independently       Knowledge and understanding of Target Heart Rate Range (THRR)  Yes       Intervention  Provide education  and explanation of THRR including how the numbers were predicted and where they are located for reference       Expected Outcomes  Short Term: Able to state/look up THRR;Short Term: Able to use daily as guideline for intensity in rehab;Long Term: Able to use THRR to govern intensity when exercising independently       Able to check pulse independently  Yes       Intervention  Provide education and demonstration on how to check pulse in carotid and radial arteries.;Review the importance of being able to check your own pulse for safety during independent exercise       Expected Outcomes  Short Term: Able to explain why pulse checking is important during independent exercise;Long Term: Able to check pulse independently and accurately       Understanding of Exercise Prescription  Yes       Intervention  Provide education, explanation, and written materials on patient's individual exercise prescription       Expected Outcomes  Short Term: Able to explain program exercise prescription;Long Term: Able to explain home exercise prescription to exercise independently          Exercise Goals Re-Evaluation : Exercise Goals Re-Evaluation    Row Name 06/01/18 0809 06/12/18 0836 06/15/18 1150 07/02/18 0948 07/15/18 1442     Exercise Goal Re-Evaluation   Exercise Goals Review  Increase Physical Activity;Increase Strength and Stamina;Able to understand and use rate of perceived exertion (RPE) scale;Able to check pulse independently;Knowledge and understanding of Target Heart Rate Range (THRR);Understanding of Exercise Prescription  Increase Physical Activity;Able to understand and use rate of perceived exertion (RPE) scale;Knowledge and understanding of Target Heart Rate Range (THRR);Understanding of Exercise Prescription;Increase Strength and Stamina;Able to understand and use Dyspnea scale;Able to check pulse independently  Increase Physical Activity;Increase Strength and Stamina;Understanding of Exercise  Prescription  Increase Physical Activity;Increase Strength and Stamina;Able to understand and use rate of perceived exertion (RPE) scale;Knowledge and understanding of  Target Heart Rate Range (THRR)  Increase Physical Activity;Increase Strength and Stamina;Able to understand and use rate of perceived exertion (RPE) scale;Knowledge and understanding of Target Heart Rate Range (THRR);Understanding of Exercise Prescription   Comments  Reviewed RPE scale, THR and program prescription with pt today.  Pt voiced understanding and was given a copy of goals to take home.   Reviewed home exercise with pt today.  Pt plans to walk at home and at track [facility] for exercise.  Reviewed THR, pulse, RPE, sign and symptoms, NTG use, and when to call 911 or MD.  Also discussed weather considerations and indoor options.  Pt voiced understanding.  Matyas is off to a good start in rehab.  He is already up to 2.7 mph on the treadmill.  We will continue to monitor his progress.   Kalep is making steady progress and has increased MET level.  Staff will discuss interval training with Elberta Fortis.  Braheem has progressed well and has increased overall MET level slightly.  Staff will introduce interval training at next session.   Expected Outcomes  Short: Use RPE daily to regulate intensity. Long: Follow program prescription in THR.  Short: Start to walk on his off days.  Long: Continue to exercise independently  Short: Continue to work on increasing workloads. Long: Continue to exercise on off days.   Short - add interval training Long - increase overall MET level  Short - add intervals to program Long - improve METs from current level   Row Name 07/30/18 0827             Exercise Goal Re-Evaluation   Exercise Goals Review  Increase Physical Activity;Increase Strength and Stamina;Able to understand and use rate of perceived exertion (RPE) scale;Knowledge and understanding of Target Heart Rate Range (THRR);Understanding of  Exercise Prescription       Comments  Muhsin completed his post 6 min walk and improved by 286 feet, 20 % more than first walk.       Expected Outcomes  Short - complete HT Long - maintain exercise on his own          Discharge Exercise Prescription (Final Exercise Prescription Changes): Exercise Prescription Changes - 07/30/18 0800      Response to Exercise   Blood Pressure (Admit)  118/62    Blood Pressure (Exercise)  152/84    Blood Pressure (Exit)  122/62    Heart Rate (Admit)  71 bpm    Heart Rate (Exercise)  112 bpm    Heart Rate (Exit)  84 bpm    Rating of Perceived Exertion (Exercise)  12    Symptoms  none    Duration  Continue with 45 min of aerobic exercise without signs/symptoms of physical distress.    Intensity  THRR unchanged      Progression   Progression  Continue to progress workloads to maintain intensity without signs/symptoms of physical distress.    Average METs  3.9      Resistance Training   Training Prescription  Yes    Weight  7 lb    Reps  10-15      Interval Training   Interval Training  Yes    Equipment  REL-XR;Recumbant Bike      Treadmill   MPH  3.2    Grade  0    Minutes  15    METs  3.44      Recumbant Bike   Level  7    RPM  60    Watts  40    Minutes  15    METs  4.39      Home Exercise Plan   Plans to continue exercise at  Home (comment)   walking   Frequency  Add 2 additional days to program exercise sessions.    Initial Home Exercises Provided  06/12/18       Nutrition:  Target Goals: Understanding of nutrition guidelines, daily intake of sodium '1500mg'$ , cholesterol '200mg'$ , calories 30% from fat and 7% or less from saturated fats, daily to have 5 or more servings of fruits and vegetables.  Biometrics: Pre Biometrics - 05/28/18 1417      Pre Biometrics   Height  '5\' 6"'$  (1.676 m)    Weight  144 lb (65.3 kg)    Waist Circumference  30.5 inches    Hip Circumference  37.5 inches    Waist to Hip Ratio  0.81 %    BMI  (Calculated)  23.25    Single Leg Stand  14.5 seconds      Post Biometrics - 07/23/18 1646       Post  Biometrics   Height  '5\' 6"'$  (1.676 m)    Weight  146 lb (66.2 kg)    Waist Circumference  32.5 inches    Hip Circumference  37 inches    Waist to Hip Ratio  0.88 %    BMI (Calculated)  23.58    Single Leg Stand  30 seconds       Nutrition Therapy Plan and Nutrition Goals: Nutrition Therapy & Goals - 06/08/18 0954      Nutrition Therapy   Diet  TLC    Protein (specify units)  10oz    Fiber  30 grams    Whole Grain Foods  3 servings   does not always choose whole grains   Saturated Fats  16 max. grams    Fruits and Vegetables  5 servings/day   8 ideal, does not eat fruit regularly   Sodium  2000 grams      Personal Nutrition Goals   Nutrition Goal  Include fruits as snacks between meals to both increase daily intake of fruits and to choose more nutritious snack options    Personal Goal #2  Work to decrease sugar sweetened beverage consumption; for example, choose PowerAde Zero rather than regular PowerAde    Personal Goal #3  Choose low-sugar cereal options. A low-sugar option will have 10g of sugar or less per serving    Personal Goal #4  Practice reading nutrition facts labels to identify foods high and low in sodium    Comments  He currently chooses processed and high sugar snack options, consumes sugar sweetened beverages IE sweet tea and soda daily, chooses some fried foods, and does not eat fruits other than bananas regularly.      Intervention Plan   Intervention  Prescribe, educate and counsel regarding individualized specific dietary modifications aiming towards targeted core components such as weight, hypertension, lipid management, diabetes, heart failure and other comorbidities.;Nutrition handout(s) given to patient.   low sodium nutrition therapy   Expected Outcomes  Short Term Goal: Understand basic principles of dietary content, such as calories, fat, sodium,  cholesterol and nutrients.;Short Term Goal: A plan has been developed with personal nutrition goals set during dietitian appointment.;Long Term Goal: Adherence to prescribed nutrition plan.       Nutrition Assessments: Nutrition Assessments - 08/06/18 1746      MEDFICTS Scores  Post Score  36       Nutrition Goals Re-Evaluation: Nutrition Goals Re-Evaluation    [place] Name 06/08/18 1005 07/30/18 0914           Goals   Nutrition Goal  Include fruits as snacks between meals to both increase daily intake of fruits and to choose more nutritious snack options  Include fruits as snacks to both include more servings of fruit in the diet and to swap out less nutritious/ more processed snack options; work to decrease sources of added sugar such as cereal and sugar sweetened beverages; read food labels more regularly      Comment  He does not consume fruits regularly other than bananas and chooses snacks like chips, little debby's, chocolate covered peanuts, and oatmeal cream pies  He feels he is improving his diet. He is choosing more nutritious snacks, reading labels for lower salt options IE low salt chips, has decreased soda intake significantly (still drinks PowerAde occasionally), eating less fried food, and eating more beans/ vegetables/ fruits. He is finding it challening to lower red meat intake      Expected Outcome  He will choose fruits as snacks more regularly, and also incorporate other snacks with greater nutritional value such as [employer] bars or trail mix  He will continue to read food labels regularly, choose lower sodium and sugar options, and continue to work on reducing intake of red meats. He will swap more of his processed snacks for whole food - based choices        Personal Goal #2 Re-Evaluation   Personal Goal #2  Work to decrease sugar sweetened beverage consumption; for example choose PowerAde Zero rather than regular PoweraAde  -        Personal Goal #3 Re-Evaluation    Personal Goal #3  Choose low-sugar cereal options. A low sugar option will have 10g of sugar or less per serving  -        Personal Goal #4 Re-Evaluation   Personal Goal #4  Practice reading nutrition facts labels to identify foods high and low in sodium  -         Nutrition Goals Discharge (Final Nutrition Goals Re-Evaluation): Nutrition Goals Re-Evaluation - 07/30/18 0914      Goals   Nutrition Goal  Include fruits as snacks to both include more servings of fruit in the diet and to swap out less nutritious/ more processed snack options; work to decrease sources of added sugar such as cereal and sugar sweetened beverages; read food labels more regularly    Comment  He feels he is improving his diet. He is choosing more nutritious snacks, reading labels for lower salt options IE low salt chips, has decreased soda intake significantly (still drinks PowerAde occasionally), eating less fried food, and eating more beans/ vegetables/ fruits. He is finding it challening to lower red meat intake    Expected Outcome  He will continue to read food labels regularly, choose lower sodium and sugar options, and continue to work on reducing intake of red meats. He will swap more of his processed snacks for whole food - based choices       Psychosocial: Target Goals: Acknowledge presence or absence of significant depression and/or stress, maximize coping skills, provide positive support system. Participant is able to verbalize types and ability to use techniques and skills needed for reducing stress and depression.   Initial Review & Psychosocial Screening: Initial Psych Review & Screening - 05/28/18 1359  Initial Review   Current issues with  Current Stress Concerns    Comments  Returning to work the end of August. Works painting car parts in the heat, long hours. He knows he needs to take it easy returning to work. He is ready to get back to his normal routine.       Family Dynamics   Good  Support System?  Yes   Wife, family     Barriers   Psychosocial barriers to participate in program  There are no identifiable barriers or psychosocial needs.;The patient should benefit from training in stress management and relaxation.      Screening Interventions   Interventions  Program counselor consult;To provide support and resources with identified psychosocial needs;Encouraged to exercise;Provide feedback about the scores to participant    Expected Outcomes  Short Term goal: Utilizing psychosocial counselor, staff and physician to assist with identification of specific Stressors or current issues interfering with healing process. Setting desired goal for each stressor or current issue identified.;Long Term Goal: Stressors or current issues are controlled or eliminated.;Short Term goal: Identification and review with participant of any Quality of Life or Depression concerns found by scoring the questionnaire.;Long Term goal: The participant improves quality of Life and PHQ9 Scores as seen by post scores and/or verbalization of changes       Quality of Life Scores:  Quality of Life - 08/06/18 1745      Quality of Life   Select  Quality of Life      Quality of Life Scores   Health/Function Post  25.83 %    Socioeconomic Post  19.38 %    Psych/Spiritual Post  29.14 %    Family Post  27.6 %    GLOBAL Post  25.27 %      Scores of 19 and below usually indicate a poorer quality of life in these areas.  A difference of  2-3 points is a clinically meaningful difference.  A difference of 2-3 points in the total score of the Quality of Life Index has been associated with significant improvement in overall quality of life, self-image, physical symptoms, and general health in studies assessing change in quality of life.  PHQ-9: Recent Review Flowsheet Data    Depression screen [facility] 2/9 08/06/2018 05/28/2018   Decreased Interest 0 2   Down, Depressed, Hopeless 0 0   PHQ - 2 Score 0 2   Altered  sleeping 0 0   Tired, decreased energy 0 0   Change in appetite 0 1   Feeling bad or failure about yourself  0 0   Trouble concentrating 0 0   Moving slowly or fidgety/restless 0 0   Suicidal thoughts 0 0   PHQ-9 Score 0 3   Difficult doing work/chores Not difficult at all Not difficult at all     Interpretation of Total Score  Total Score Depression Severity:  1-4 = Minimal depression, 5-9 = Mild depression, 10-14 = Moderate depression, 15-19 = Moderately severe depression, 20-27 = Severe depression   Psychosocial Evaluation and Intervention: Psychosocial Evaluation - 06/10/18 0911      Psychosocial Evaluation & Interventions   Interventions  Relaxation education;Stress management education;Encouraged to exercise with the program and follow exercise prescription    Comments  Counselor met with Mr. Dinovo Quant) today for initial psychosocial evaluation.  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  Rolf has a strong support system with a sposue of 37 years; an  adult daughter and son who live locally and active involvement in his local church.  He reports being in fairly good health other than his heart with the exception of the need for hip replacement in the future.  Christien states he sleeps well and has a good appetite.  He denies a history of depression or anxiety or any current symptoms and he is typically in a positive mood.  Quinten says his primary stressors are being out of work currently and his health.  He will return to work on 8/26.  His goals for this program are to lose weight; get back to work; and continue to increase his stamina and energy.  Staff will follow with Elberta Fortis.     Expected Outcomes  Short:  Nicolae will continue exercising for his health and stress management.  He will attend the psychoeducational components of this program to learn more strategies to manage stress and set healthier limits on his work hours.  Long:  Jamaar will continue a routine of  exercise and positive self-care upon completion of this program.      Continue Psychosocial Services   Follow up required by staff       Psychosocial Re-Evaluation: Psychosocial Re-Evaluation    [place] Name 06/19/18 [phone] 07/17/18 0851           Psychosocial Re-Evaluation   Current issues with  Current Stress Concerns  Current Stress Concerns      Comments  Keyante goes back to work next Monday.  He has some stress at home that is normal.  He manages stress by exercising and he is working on breathing techniques.   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      Expected Outcomes  Short - continue to attend HT Long - manage stress with exrecise and breathing  Short - continue to attend HT to stay fit for work Long - exercise on his own      Interventions  Encouraged to attend Cardiac Rehabilitation for the exercise;Stress management education;Relaxation education  -      Continue Psychosocial Services   Follow up required by staff  -         Psychosocial Discharge (Final Psychosocial Re-Evaluation): Psychosocial Re-Evaluation - 07/17/18 0851      Psychosocial Re-Evaluation   Current issues with  Current Stress Concerns    Comments  Aqib was concerned 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    Expected Outcomes  Short - continue to attend HT to stay fit for work Long - exercise on his own       Vocational Rehabilitation: Provide vocational rehab assistance to qualifying candidates.   Vocational Rehab Evaluation & Intervention: Vocational Rehab - 05/28/18 1412      Initial Vocational Rehab Evaluation & Intervention   Assessment shows need for Vocational Rehabilitation  No       Education: Education Goals: Education classes will be provided on a variety of topics geared toward better understanding of heart health and risk factor modification. Participant will state understanding/return demonstration  of topics presented as noted by education test scores.  Learning Barriers/Preferences: Learning Barriers/Preferences - 05/28/18 1410      Learning Barriers/Preferences   Learning Barriers  Exercise Concerns    Learning Preferences  Computer/Internet;Verbal Instruction;Group Instruction;Skilled Demonstration;Video       Education Topics:  AED/CPR: - Group verbal and written instruction with the use of  models to demonstrate the basic use of the AED with the basic ABC's of resuscitation.   Cardiac Rehab from 08/05/2018 in [facility] Cardiac and Pulmonary Rehab  Date  07/13/18  (Pended)   Educator  CE  [place])   Instruction Review Code  1- Verbalizes Understanding  (Pended)       General Nutrition Guidelines/Fats and Fiber: -Group instruction provided by verbal, written material, models and posters to present the general guidelines for heart healthy nutrition. Gives an explanation and review of dietary fats and fiber.   Cardiac Rehab from 08/05/2018 in [facility] Cardiac and Pulmonary Rehab  Date  07/06/18  (Pended)   Educator  [place]  (Pended)   Instruction Review Code  1- Verbalizes Understanding  (Pended)       Controlling Sodium/Reading Food Labels: -Group verbal and written material supporting the discussion of sodium use in heart healthy nutrition. Review and explanation with models, verbal and written materials for utilization of the food label.   Cardiac Rehab from 08/05/2018 in [facility] Cardiac and Pulmonary Rehab  Date  07/15/18  (Pended)   Educator  LB  (Pended)   Instruction Review Code  1- Verbalizes Understanding  (Pended)       Exercise Physiology & General Exercise Guidelines: - Group verbal and written instruction with models to review the exercise physiology of the cardiovascular system and associated critical values. Provides general exercise guidelines with specific guidelines to those with heart or lung disease.    Aerobic Exercise & Resistance Training: - Gives group verbal  and written instruction on the various components of exercise. Focuses on aerobic and resistive training programs and the benefits of this training and how to safely progress through these programs..   Cardiac Rehab from 08/05/2018 in [facility] Cardiac and Pulmonary Rehab  Date  06/01/18  (Pended)   Educator  [facility]  (Pended)   Instruction Review Code  1- Verbalizes Understanding  (Pended)       Flexibility, Balance, Mind/Body Relaxation: Provides group verbal/written instruction on the benefits of flexibility and balance training, including mind/body exercise modes such as yoga, pilates and tai chi.  Demonstration and skill practice provided.   Cardiac Rehab from 08/05/2018 in [facility] Cardiac and Pulmonary Rehab  Date  08/03/18  (Pended)   Educator  [place]  (Pended)   Instruction Review Code  1- Verbalizes Understanding  (Pended)       Stress and Anxiety: - Provides group verbal and written instruction about the health risks of elevated stress and causes of high stress.  Discuss the correlation between heart/lung disease and anxiety and treatment options. Review healthy ways to manage with stress and anxiety.   Cardiac Rehab from 08/05/2018 in [facility] Cardiac and Pulmonary Rehab  Date  06/17/18  (Pended)   Educator  [place]  (Pended)   Instruction Review Code  1- Verbalizes Understanding  (Pended)       Depression: - Provides group verbal and written instruction on the correlation between heart/lung disease and depressed mood, treatment options, and the stigmas associated with seeking treatment.   Cardiac Rehab from 08/05/2018 in [facility] Cardiac and Pulmonary Rehab  Date  07/29/18  (Pended)   Educator  [place]  (Pended)   Instruction Review Code  1- Verbalizes Understanding  (Pended)       Anatomy & Physiology of the Heart: - Group verbal and written instruction and models provide basic cardiac anatomy and physiology, with the coronary electrical and arterial systems. Review of Valvular disease and Heart Failure  Cardiac Rehab from 08/05/2018 in [facility] Cardiac and Pulmonary Rehab  Date  06/15/18  (Pended)   Educator  CE  [place])   Instruction Review Code  1- Verbalizes Understanding  (Pended)       Cardiac Procedures: - Group verbal and written instruction to review commonly prescribed medications for heart disease. Reviews the medication, class of the drug, and side effects. Includes the steps to properly store meds and maintain the prescription regimen. (beta blockers and nitrates)   Cardiac Medications I: - Group verbal and written instruction to review commonly prescribed medications for heart disease. Reviews the medication, class of the drug, and side effects. Includes the steps to properly store meds and maintain the prescription regimen.   Cardiac Rehab from 08/05/2018 in [facility] Cardiac and Pulmonary Rehab  Date  06/22/18  (Pended)   Educator  CE  [place])   Instruction Review Code  1- Verbalizes Understanding  (Pended)       Cardiac Medications II: -Group verbal and written instruction to review commonly prescribed medications for heart disease. Reviews the medication, class of the drug, and side effects. (all other drug classes)   Cardiac Rehab from 08/05/2018 in [facility] Cardiac and Pulmonary Rehab  Date  06/10/18  (Pended)   Educator  [place]  (Pended)   Instruction Review Code  1- Verbalizes Understanding  (Pended)        Go Sex-Intimacy & Heart Disease, Get SMART - Goal Setting: - Group verbal and written instruction through game format to discuss heart disease and the return to sexual intimacy. Provides group verbal and written material to discuss and apply goal setting through the application of the S.M.A.R.T. Method.   Other Matters of the Heart: - Provides group verbal, written materials and models to describe Stable Angina and Peripheral Artery. Includes description of the disease process and treatment options available to the cardiac patient.   Cardiac Rehab from 08/05/2018 in [facility]  Cardiac and Pulmonary Rehab  Date  06/15/18  (Pended)   Educator  CE  [place])   Instruction Review Code  1- Verbalizes Understanding  (Pended)       Exercise & Equipment Safety: - Individual verbal instruction and demonstration of equipment use and safety with use of the equipment.   Cardiac Rehab from 08/05/2018 in [facility] Cardiac and Pulmonary Rehab  Date  05/28/18  (Pended)   Educator  [facility]  (Pended)   Instruction Review Code  1- Verbalizes Understanding  (Pended)       Infection Prevention: - Provides verbal and written material to individual with discussion of infection control including proper hand washing and proper equipment cleaning during exercise session.   Cardiac Rehab from 08/05/2018 in [facility] Cardiac and Pulmonary Rehab  Date  05/28/18  (Pended)   Educator  [facility]  (Pended)   Instruction Review Code  1- Verbalizes Understanding  (Pended)       Falls Prevention: - Provides verbal and written material to individual with discussion of falls prevention and safety.   Cardiac Rehab from 08/05/2018 in [facility] Cardiac and Pulmonary Rehab  Date  05/28/18  (Pended)   Educator  [facility]  (Pended)   Instruction Review Code  1- Verbalizes Understanding  (Pended)       Diabetes: - Individual verbal and written instruction to review signs/symptoms of diabetes, desired ranges of glucose level fasting, after meals and with exercise. Acknowledge that pre and post exercise glucose checks will be done for 3 sessions at entry of program.   Know Your Numbers  and Risk Factors: -Group verbal and written instruction about important numbers in your health.  Discussion of what are risk factors and how they play a role in the disease process.  Review of Cholesterol, Blood Pressure, Diabetes, and BMI and the role they play in your overall health.   Cardiac Rehab from 08/05/2018 in [facility] Cardiac and Pulmonary Rehab  Date  06/10/18  (Pended)   Educator  [place]  (Pended)   Instruction Review Code  1- Verbalizes  Understanding  (Pended)       Sleep Hygiene: -Provides group verbal and written instruction about how sleep can affect your health.  Define sleep hygiene, discuss sleep cycles and impact of sleep habits. Review good sleep hygiene tips.    Other: -Provides group and verbal instruction on various topics (see comments)   Knowledge Questionnaire Score: Knowledge Questionnaire Score - 08/06/18 1745      Knowledge Questionnaire Score   Post Score  21/26       Core Components/Risk Factors/Patient Goals at Admission: Personal Goals and Risk Factors at Admission - 05/28/18 1356      Core Components/Risk Factors/Patient Goals on Admission    Weight Management  Yes;Weight Gain    Intervention  Weight Management: Develop a combined nutrition and exercise program designed to reach desired caloric intake, while maintaining appropriate intake of nutrient and fiber, sodium and fats, and appropriate energy expenditure required for the weight goal.;Weight Management: Provide education and appropriate resources to help participant work on and attain dietary goals.    Admit Weight  144 lb (65.3 kg)    Goal Weight: Short Term  148 lb (67.1 kg)    Goal Weight: Long Term  150 lb (68 kg)    Expected Outcomes  Short Term: Continue to assess and modify interventions until short term weight is achieved;Long Term: Adherence to nutrition and physical activity/exercise program aimed toward attainment of established weight goal;Weight Maintenance: Understanding of the daily nutrition guidelines, which includes 25-35% calories from fat, 7% or less cal from saturated fats, less than '200mg'$  cholesterol, less than 1.5gm of sodium, & 5 or more servings of fruits and vegetables daily;Understanding recommendations for meals to include 15-35% energy as protein, 25-35% energy from fat, 35-60% energy from carbohydrates, less than '200mg'$  of dietary cholesterol, 20-35 gm of total fiber daily;Understanding of distribution of calorie  intake throughout the day with the consumption of 4-5 meals/snacks;Weight Gain: Understanding of general recommendations for a high calorie, high protein meal plan that promotes weight gain by distributing calorie intake throughout the day with the consumption for 4-5 meals, snacks, and/or supplements    Tobacco Cessation  Yes   quit 5/24   Intervention  Assist the participant in steps to quit. Provide individualized education and counseling about committing to Tobacco Cessation, relapse prevention, and pharmacological support that can be provided by physician.;Advice worker, assist with locating and accessing local/national Quit Smoking programs, and support quit date choice.    Expected Outcomes  Short Term: Will demonstrate readiness to quit, by selecting a quit date.;Long Term: Complete abstinence from all tobacco products for at least 12 months from quit date.;Short Term: Will quit all tobacco product use, adhering to prevention of relapse plan.    Hypertension  Yes    Intervention  Provide education on lifestyle modifcations including regular physical activity/exercise, weight management, moderate sodium restriction and increased consumption of fresh fruit, vegetables, and low fat dairy, alcohol moderation, and smoking cessation.;Monitor prescription use compliance.    Expected Outcomes  Long  Term: Maintenance of blood pressure at goal levels.;Short Term: Continued assessment and intervention until BP is < 140/8m HG in hypertensive participants. < 130/866mHG in hypertensive participants with diabetes, heart failure or chronic kidney disease.    Lipids  Yes    Intervention  Provide education and support for participant on nutrition & aerobic/resistive exercise along with prescribed medications to achieve LDL '70mg'$ , HDL >'40mg'$ .    Expected Outcomes  Short Term: Participant states understanding of desired cholesterol values and is compliant with medications prescribed. Participant is  following exercise prescription and nutrition guidelines.;Long Term: Cholesterol controlled with medications as prescribed, with individualized exercise RX and with personalized nutrition plan. Value goals: LDL < '70mg'$ , HDL > 40 mg.       Core Components/Risk Factors/Patient Goals Review:  Goals and Risk Factor Review    Row Name 06/19/18 0826 07/17/18 0848           Core Components/Risk Factors/Patient Goals Review   Personal Goals Review  Weight Management/Obesity;Lipids;Hypertension  Lipids;Hypertension;Other;Stress      Review  AnIssiahet with the RD and is making healthier choices by reducing fat and sodium.  He takes meds as directed.  BP has been in normal limits during HT.  He is eating more fruits and vegetables.  he feels he has better stamina, strength and better mood since starting HT.  AnAdrainas felt good at work , evrything is going good for him.  He does know he needs to cut back on junk food and eating out.  We discussed ways to add more fruits and vegetables to his diet.  he does walk for up to an hour on Saturdays.         Expected Outcomes  Short - Continue to attend and make healthy changes to diet  Long - maintain exercise and dietary habits  Short - AnKalibill reduce the amount he eats out and add more fruits and vegetables Long - maintain exercise on his own         Core Components/Risk Factors/Patient Goals at Discharge (Final Review):  Goals and Risk Factor Review - 07/17/18 0848      Core Components/Risk Factors/Patient Goals Review   Personal Goals Review  Lipids;Hypertension;Other;Stress    Review  AnLevornas felt good at work , evrything is going good for him.  He does know he needs to cut back on junk food and eating out.  We discussed ways to add more fruits and vegetables to his diet.  he does walk for up to an hour on Saturdays.       Expected Outcomes  Short - AnRivaldoill reduce the amount he eats out and add more fruits and vegetables Long - maintain  exercise on his own       ITP Comments: ITP Comments    Row Name 05/28/18 1336 06/10/18 0757 07/08/18 0555 08/05/18 0741     ITP Comments  Med Review completed. Initial ITP created. [facility]  30 day review completed. ITP sent to Dr. BeRamonita Labcovering for Dr. MaEmily FilbertMedical Director of Cardiac Rehab. Continue with ITP unless changes are made by physician.  New to program  30 day review completed. ITP sent to Dr. MaEmily FilbertMedical Director of Cardiac Rehab. Continue with ITP unless changes are made by physician  30 day review completed. ITP sent to Dr. MaEmily FilbertMedical Director of Cardiac Rehab. Continue with ITP unless changes are made by physician  Comments: discharge ITP

## 2019-06-22 IMAGING — CT CT ANGIO AOBIFEM WO/W CM
2 of 10 series · 11 of 46 positions shown, 14 images · IV contrast (APPLIED)
Comparison: None.

CLINICAL DATA: Acute onset of recurrent lower extremity discomfort
after taking atorvastatin. Bilateral lower extremity nonpitting
edema. Right femoral swelling and pain.

EXAM:
CT ANGIOGRAPHY OF ABDOMINAL AORTA WITH ILIOFEMORAL RUNOFF
TECHNIQUE: Multidetector CT imaging of the abdomen, pelvis and lower
extremities was performed using the standard protocol during bolus
administration of intravenous contrast. Multiplanar CT image
reconstructions and MIPs were obtained to evaluate the vascular
anatomy.
CONTRAST:  125mL 0R208K-0TQ IOPAMIDOL (0R208K-0TQ) INJECTION 76%

[Series 4: axial arterial upper (person_name) · axial · arterial · 0.88mm/px · z∈[+455,+1175]mm · 10 of 288 slices shown, 13 images]
[im 32/288  soft-tissue]
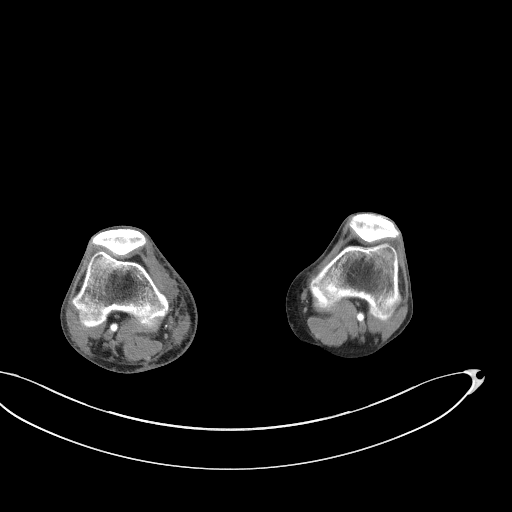
[im 32/288  bone]
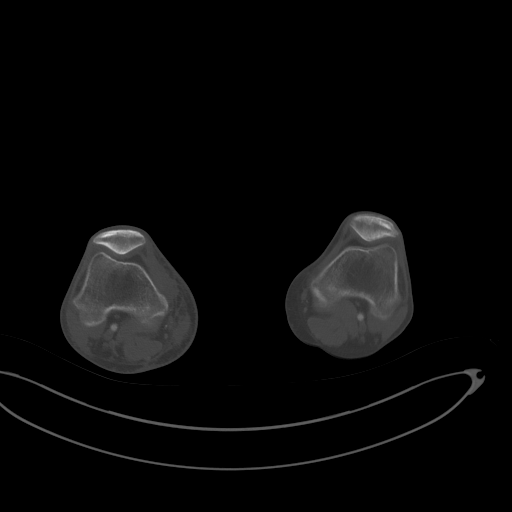
[im 64/288  soft-tissue]
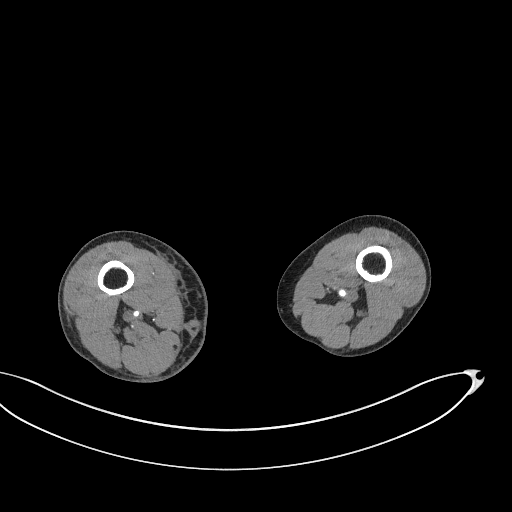
[im 96/288  soft-tissue]
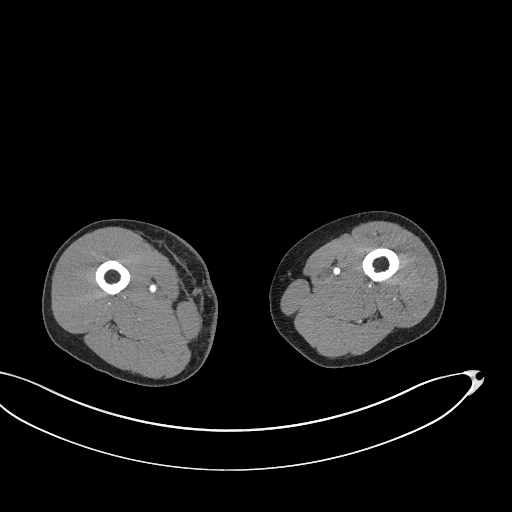
[im 128/288  soft-tissue]
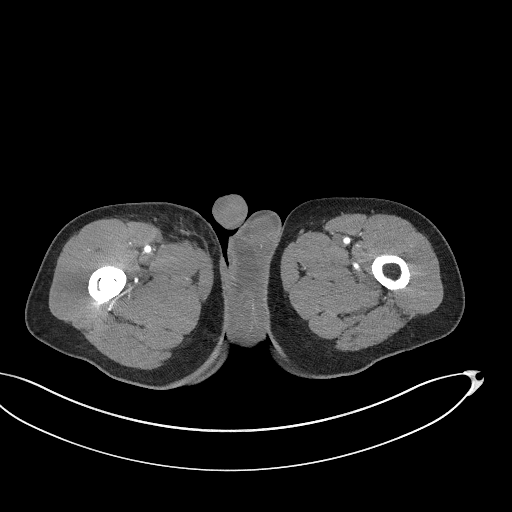
[im 160/288  soft-tissue]
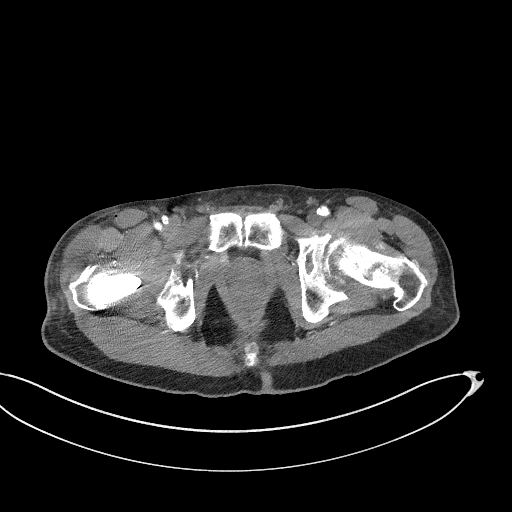
[im 192/288  soft-tissue]
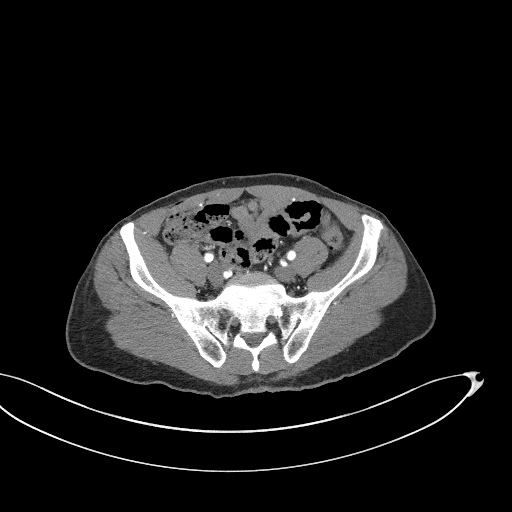
[im 224/288  soft-tissue]
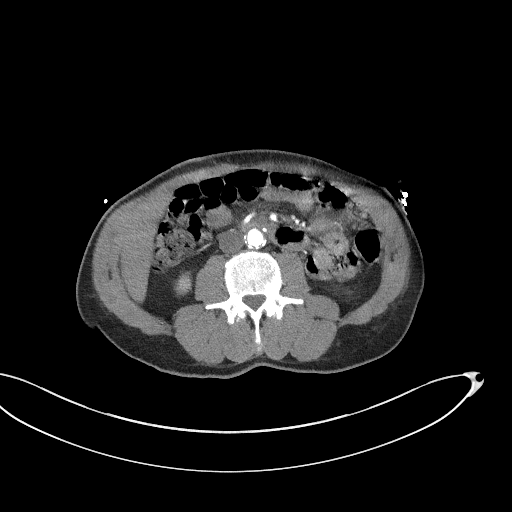
[im 224/288  lung]
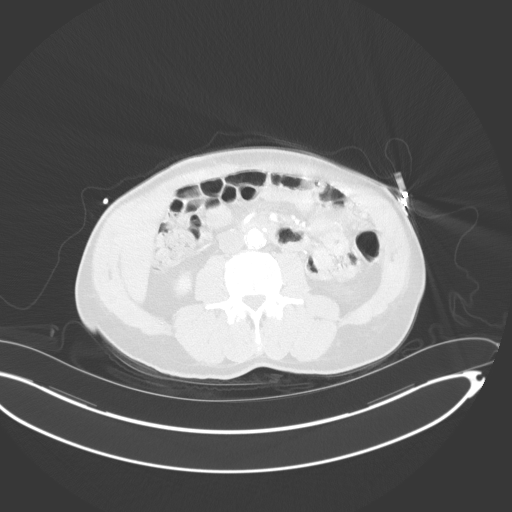
[im 240/288  lung]
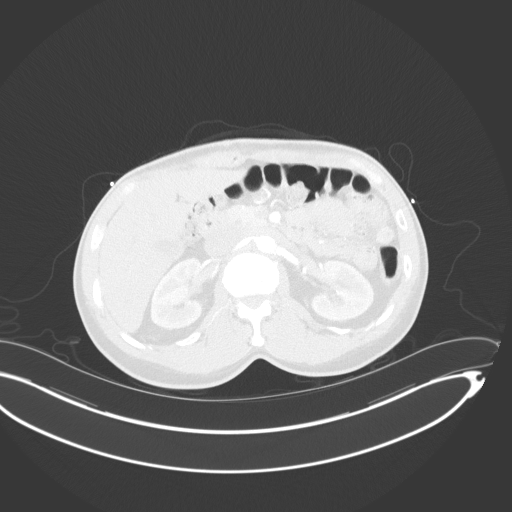
[im 256/288  soft-tissue]
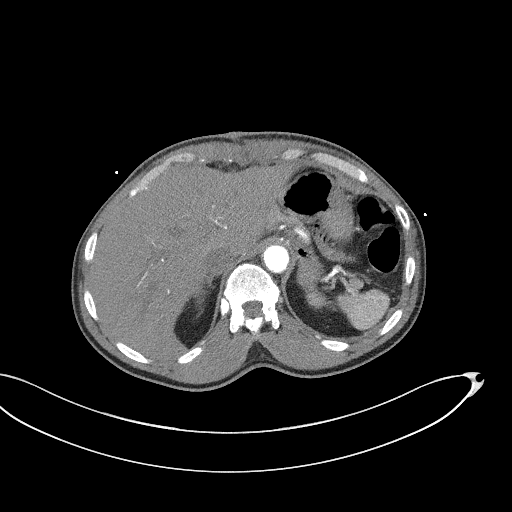
[im 256/288  lung]
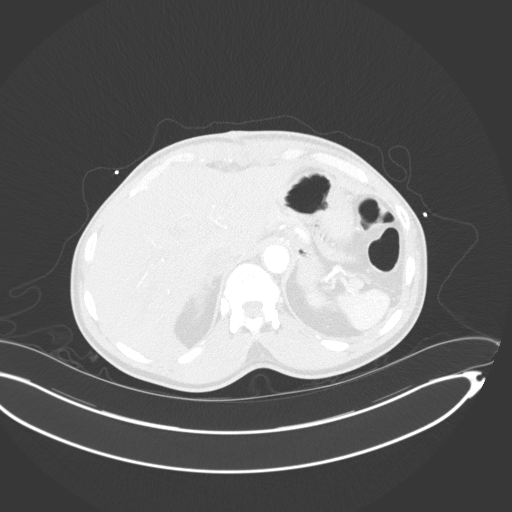
[im 272/288  lung]
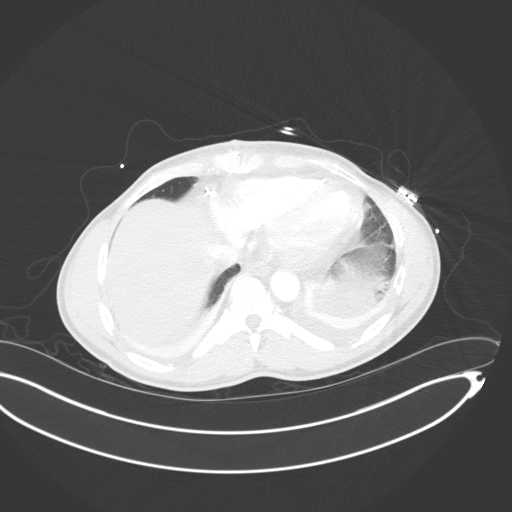

[Series 11: coronal lower · coronal · 0.71mm/px · 1 of 126 slices shown]
[im 63/126  soft-tissue]
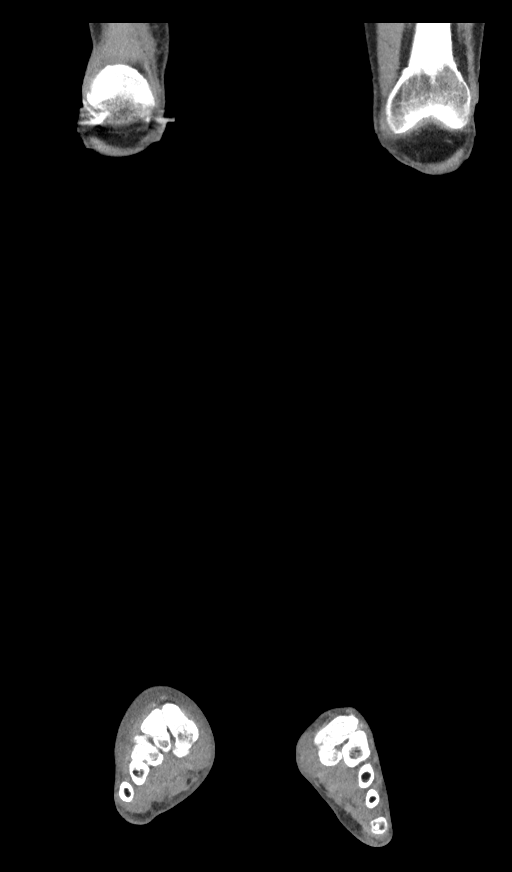

[11 of 46 positions shown; findings below may reference images not displayed]

FINDINGS: VASCULAR

Aorta: There is no evidence of aortic dissection. There is no
evidence of aneurysmal dilatation. Scattered calcification is seen
along the abdominal aorta and its branches.

Celiac: There is mild narrowing at the origin of the celiac trunk.
Mild associated calcification is seen.

SMA: The superior mesenteric artery remains patent.

Renals: There is mild narrowing at the larger of the patient's
proximal right renal arteries. The patient has 2 right-sided renal
arteries. The left renal artery is grossly unremarkable.

IMA: There is marked narrowing at the origin of the inferior
mesenteric artery.

RIGHT Lower Extremity

Inflow: There is severe luminal narrowing at the origin of the right
internal iliac artery, and additional scattered calcification and
mural thrombus along the right internal iliac artery. The right
common and external iliac arteries demonstrate scattered
calcification but are otherwise unremarkable. The right common
femoral artery demonstrate scattered calcification.

Outflow: There is slight narrowing at the proximal superficial
femoral artery. The profunda femoris artery and its branches appear
grossly patent. Mild scattered calcification is seen at the distal
superficial femoral artery and popliteal artery.

Runoff: There is patent 3 vessel runoff to the level of the right
ankle, though atherosclerotic disease is suggested along the
anterior tibial artery.

LEFT Lower Extremity

Inflow: There is severe luminal narrowing at the origin of the left
internal iliac artery, and additional scattered calcification along
the proximal left internal iliac artery. The left common and
external iliac arteries demonstrate scattered calcification but are
otherwise unremarkable. The left common femoral artery demonstrates
mild calcification.

Outflow: There is slight luminal narrowing at the proximal left
superficial femoral artery. The left profunda femoris artery and its
branches are grossly unremarkable in appearance. Mild calcification
is noted at the distal superficial femoral artery and popliteal
artery.

Runoff: There is patent 3 vessel runoff to the level of the left
ankle, though atherosclerotic disease is noted along the left
anterior tibial artery.

Veins: Visualized venous structures are grossly unremarkable.

Review of the MIP images confirms the above findings.

NON-VASCULAR

Lower chest: A trace left pleural effusion is noted. Bibasilar
airspace opacities likely reflect atelectasis. Trace pericardial
fluid is noted. The patient is status post median sternotomy.

Hepatobiliary: The liver is unremarkable in appearance. The
gallbladder is unremarkable in appearance. The common bile duct
remains normal in caliber.

Pancreas: The pancreas is within normal limits.

Spleen: The spleen is unremarkable in appearance.

Adrenals/Urinary Tract: The adrenal glands are unremarkable in
appearance. The kidneys are within normal limits. There is no
evidence of hydronephrosis. No renal or ureteral stones are
identified. No perinephric stranding is seen.

Stomach/Bowel: The stomach is unremarkable in appearance. The small
bowel is within normal limits. The appendix is not visualized; there
is no evidence for appendicitis. The colon is unremarkable in
appearance.

Lymphatic: No retroperitoneal or pelvic sidewall lymphadenopathy is
seen.

Reproductive: The bladder is mildly distended and grossly
unremarkable. The prostate is normal in size.

Other: A moderate to large right-sided hydrocele is noted, with a
small right-sided scrotal pearl.

Musculoskeletal: The patient's right hip arthroplasty is grossly
unremarkable. There is severe degenerative change at the left hip
joint, with prominent subcortical cysts and joint space loss.
Sclerotic change is noted at L4. The visualized musculature is
unremarkable in appearance.

Diffuse calcification is seen along the abdominal aorta and its
branches. The abdominal aorta is otherwise grossly unremarkable. The
inferior vena cava is grossly unremarkable. No retroperitoneal
lymphadenopathy is seen. No pelvic sidewall lymphadenopathy is
identified.

Soft tissue edema is noted at the medial aspect of the right knee
and along the medial right thigh, extending through the right
inguinal region. Mildly prominent right inguinal nodes measure up to
1.2 cm in short axis. A 3.1 cm subcutaneous hematoma is noted medial
to the right distal femur, while a 2.9 cm subcutaneous hematoma is
noted along the medial aspect of the right thigh. Additional soft
tissue edema is noted overlying the medial quadriceps musculature.
IMPRESSION: VASCULAR

1. No evidence of aortic dissection. No evidence of aneurysmal
dilatation.
2. Patent three-vessel runoff to the level of both ankles, though
atherosclerotic disease is noted along the anterior tibial arteries
bilaterally.
3. Scattered calcific atherosclerotic disease noted along the
arterial vasculature to both lower extremities, as described above.
4. Mild narrowing at the origin of the celiac trunk and at the
larger of the patient's right renal arteries. Severe luminal
narrowing at the origin of the inferior mesenteric artery, and at
the origins of the internal iliac arteries bilaterally.

NON-VASCULAR

1. Soft tissue edema at the medial aspect of the right knee and
along the medial right thigh, extending through the right inguinal
region. Subcutaneous hematomas noted medial to the distal right
femur and along the medial aspect of the right thigh, measuring
cm and 2.9 cm. Diffuse soft tissue edema overlying the medial
quadriceps musculature.
2. Moderate to large right-sided hydrocele, with a small right-sided
scrotal pearl.
3. Trace left pleural effusion. Bibasilar airspace opacities likely
reflect atelectasis.
4. Severe degenerative change at the left hip joint, with prominent
subcortical cysts and joint space loss.

Aortic Atherosclerosis ([id number].X).

## 2019-10-29 HISTORY — PX: TOTAL HIP ARTHROPLASTY: SHX124

## 2020-02-05 ENCOUNTER — Ambulatory Visit: Payer: Self-pay | Attending: Internal Medicine

## 2020-02-05 DIAGNOSIS — Z23 Encounter for immunization: Secondary | ICD-10-CM

## 2020-02-05 NOTE — Progress Notes (Signed)
   Covid-19 Vaccination Clinic  Name:  Omarie Parcell    MRN: [id number] DOB: 08-21-1956  02/05/2020  Mr. Celli was observed post Covid-19 immunization for 15 minutes without incident. He was provided with Vaccine Information Sheet and instruction to access the V-Safe system.   Mr. Goracke was instructed to call 911 with any severe reactions post vaccine: [name] Difficulty breathing  . Swelling of face and throat  . A fast heartbeat  . A bad rash all over body  . Dizziness and weakness   Immunizations Administered    Name Date Dose VIS Date Route   Pfizer COVID-19 Vaccine 02/05/2020 11:52 AM 0.3 mL 10/08/2019 Intramuscular   Manufacturer: [employer], [employer]   Lot: [id number]   NDC: [address]-9

## 2020-02-29 ENCOUNTER — Ambulatory Visit: Payer: Self-pay | Attending: Internal Medicine

## 2020-02-29 DIAGNOSIS — Z23 Encounter for immunization: Secondary | ICD-10-CM

## 2020-02-29 NOTE — Progress Notes (Signed)
   Covid-19 Vaccination Clinic  Name:  Mark Mack    MRN: [id number] DOB: July 23, 1956  02/29/2020  [name] was observed post Covid-19 immunization for 15 minutes without incident. He was provided with Vaccine Information Sheet and instruction to access the V-Safe system.   [name] was instructed to call 911 with any severe reactions post vaccine: [name] Difficulty breathing  . Swelling of face and throat  . A fast heartbeat  . A bad rash all over body  . Dizziness and weakness   Immunizations Administered    Name Date Dose VIS Date Route   Pfizer COVID-19 Vaccine 02/29/2020  1:42 PM 0.3 mL 12/22/2018 Intramuscular   Manufacturer: [employer], [employer]   Lot: [id number]   NDC: [address]-5

## 2020-06-03 ENCOUNTER — Emergency Department
Admission: EM | Admit: 2020-06-03 | Discharge: 2020-06-03 | Disposition: A | Discharge status: Home / Self Care | Payer: No Typology Code available for payment source | Attending: Emergency Medicine | Admitting: Emergency Medicine

## 2020-06-03 ENCOUNTER — Emergency Department: Payer: No Typology Code available for payment source

## 2020-06-03 ENCOUNTER — Other Ambulatory Visit: Payer: Self-pay

## 2020-06-03 DIAGNOSIS — R0789 Other chest pain: Secondary | ICD-10-CM | POA: Diagnosis not present

## 2020-06-03 DIAGNOSIS — I10 Essential (primary) hypertension: Secondary | ICD-10-CM | POA: Diagnosis not present

## 2020-06-03 DIAGNOSIS — Z87891 Personal history of nicotine dependence: Secondary | ICD-10-CM | POA: Insufficient documentation

## 2020-06-03 DIAGNOSIS — I25110 Atherosclerotic heart disease of native coronary artery with unstable angina pectoris: Secondary | ICD-10-CM | POA: Insufficient documentation

## 2020-06-03 DIAGNOSIS — R079 Chest pain, unspecified: Secondary | ICD-10-CM

## 2020-06-03 LAB — TROPONIN I (HIGH SENSITIVITY)
Troponin I (High Sensitivity): 4 ng/L (ref <18)
Troponin I (High Sensitivity): 4 ng/L (ref <18)

## 2020-06-03 LAB — COMPREHENSIVE METABOLIC PANEL
ALT: 59 U/L — ABNORMAL HIGH (ref 0–44)
AST: 153 U/L — ABNORMAL HIGH (ref 15–41)
Albumin: 3.9 g/dL (ref 3.5–5.0)
Alkaline Phosphatase: 132 U/L — ABNORMAL HIGH (ref 38–126)
Anion gap: 17 — ABNORMAL HIGH (ref 5–15)
BUN: 20 mg/dL (ref 8–23)
CO2: 18 mmol/L — ABNORMAL LOW (ref 22–32)
Calcium: 8.4 mg/dL — ABNORMAL LOW (ref 8.9–10.3)
Chloride: 107 mmol/L (ref 98–111)
Creatinine, Ser: 1.13 mg/dL (ref 0.61–1.24)
GFR calc Af Amer: >60 mL/min (ref >60)
GFR calc non Af Amer: >60 mL/min (ref >60)
Glucose, Bld: 46 mg/dL — ABNORMAL LOW (ref 70–99)
Potassium: 3.4 mmol/L — ABNORMAL LOW (ref 3.5–5.1)
Sodium: 142 mmol/L (ref 135–145)
Total Bilirubin: 0.6 mg/dL (ref 0.3–1.2)
Total Protein: 6.9 g/dL (ref 6.5–8.1)

## 2020-06-03 LAB — CBC
HCT: 43.6 % (ref 39.0–52.0)
Hemoglobin: 13.7 g/dL (ref 13.0–17.0)
MCH: 27.2 pg (ref 26.0–34.0)
MCHC: 31.4 g/dL (ref 30.0–36.0)
MCV: 86.7 fL (ref 80.0–100.0)
Platelets: 291 10*3/uL (ref 150–400)
RBC: 5.03 MIL/uL (ref 4.22–5.81)
RDW: 15.2 % (ref 11.5–15.5)
WBC: 14.1 10*3/uL — ABNORMAL HIGH (ref 4.0–10.5)
nRBC: 0 % (ref 0.0–0.2)

## 2020-06-03 LAB — GLUCOSE, CAPILLARY
Glucose-Capillary: 169 mg/dL — ABNORMAL HIGH (ref 70–99)
Glucose-Capillary: 119 mg/dL — ABNORMAL HIGH (ref 70–99)

## 2020-06-03 LAB — LIPASE, BLOOD: Lipase: 23 U/L (ref 11–51)

## 2020-06-03 LAB — ETHANOL: Alcohol, Ethyl (B): 82 mg/dL — ABNORMAL HIGH (ref <10)

## 2020-06-03 MED ORDER — DEXTROSE 50 % IV SOLN
25.0000 g | Freq: Once | INTRAVENOUS | Status: AC
  Administered 2020-06-03: 25 g via INTRAVENOUS
  Filled 2020-06-03: qty 50

## 2020-06-03 MED ORDER — ONDANSETRON HCL 4 MG/2ML IJ SOLN
4.0000 mg | Freq: Once | INTRAMUSCULAR | Status: AC
  Administered 2020-06-03: 4 mg via INTRAVENOUS
  Filled 2020-06-03: qty 2

## 2020-06-03 MED ORDER — SODIUM CHLORIDE 0.9 % IV BOLUS
1000.0000 mL | Freq: Once | INTRAVENOUS | Status: AC
  Administered 2020-06-03: 1000 mL via INTRAVENOUS

## 2020-06-03 NOTE — ED Triage Notes (Signed)
Pt arrives via EMS from home with c/o chest pain and SHOB- pt recently had bypass surgery- pt was at a birthday party and this started last night after the party

## 2020-06-03 NOTE — ED Notes (Signed)
Temp was checked twice by two different thermometers

## 2020-06-03 NOTE — ED Provider Notes (Signed)
[facility] Emergency Department Provider Note  Time seen: 7:59 AM  I have reviewed the triage vital signs and the nursing notes.   HISTORY  Chief Complaint Chest Pain   HPI Mark Mack is a 64 y.o. male with a past medical history of hypertension, hyperlipidemia, CABG 2 years ago, presents to the emergency department for chest discomfort.  According to the patient around 1:00 this morning he developed some chest pain.  States mild to moderate along with dizziness and nausea.  Patient states he then began drinking alcohol which did not seem to alleviate the pain per patient.  States several beers and whiskey.  Denies any shortness of breath cough or fever.  Does state 5/10 chest discomfort which he describes as substernal in the center of his chest.  No diaphoresis.  Does continue state mild nausea.   Past Medical History:  Diagnosis Date  . Hypertension     Patient Active Problem List   Diagnosis Date Noted  . Chest pain 04/03/2018  . Coronary artery disease of native artery of native heart with stable angina pectoris ([place]) 02/14/2018  . History of total right hip replacement 10/02/2012  . Primary hypertension 12/06/2008  . 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 Hypercholesterolemia 04/17/2005  . Arthropathy of pelvic region and thigh 09/15/2000  . Esophageal reflux 09/15/2000    Past Surgical History:  Procedure Laterality Date  . CARDIAC SURGERY      Prior to Admission medications   Medication Sig Start Date End Date Taking? Authorizing Provider  acetaminophen (TYLENOL) 500 MG tablet Take 1,000 mg by mouth every 6 (six) hours as needed.     [provider]  aspirin EC 81 MG tablet Take 81 mg by mouth daily.     [provider]  docusate sodium (COLACE) 100 MG capsule Take 1 capsule (100 mg total) by mouth daily. Patient not taking: Reported on 05/28/2018 04/03/18   Nicholes [place], MD  esomeprazole (NEXIUM) 20 MG packet Take 20 mg by mouth daily  before breakfast.  04/01/18 05/01/18  [provider]  ezetimibe (ZETIA) 10 MG tablet Take 10 mg by mouth daily. 05/22/18   [provider]  ferrous sulfate 325 (65 FE) MG EC tablet Take 1 tablet (325 mg total) by mouth daily with breakfast. Patient not taking: Reported on 05/28/2018 04/03/18 04/03/19  Nicholes [place], MD  furosemide (LASIX) 20 MG tablet Take 20 mg by mouth daily. 03/29/18   [provider]  metoprolol succinate (TOPROL XL) 50 MG 24 hr tablet Take 50 mg by mouth daily.  02/13/18 02/13/19  [provider]  ranitidine (ZANTAC) 150 MG tablet  05/26/18   [provider]  rosuvastatin (CRESTOR) 20 MG tablet Take 1 tablet (20 mg total) by mouth at bedtime. Patient not taking: Reported on 05/28/2018 04/03/18 04/03/19  Nicholes [place], MD  senna-docusate (SENOKOT-S) 8.6-50 MG tablet Take 1 tablet by mouth at bedtime as needed for mild constipation. Patient not taking: Reported on 05/28/2018 04/03/18   Nicholes [place], MD    No Known Allergies  No family history on file.  Social History Social History   Tobacco Use  . Smoking status: Former Smoker    Quit date: 03/23/2018    Years since quitting: 2.2  . Smokeless tobacco: Never Used  Substance Use Topics  . Alcohol use: Not on file    Comment: occassional   . Drug use: Not on file    Review of Systems Constitutional: Negative for fever. Cardiovascular:  Chest pain since 1 AM Respiratory: Negative for shortness of breath. Gastrointestinal: Negative for abdominal pain, vomiting.  Positive nausea. Genitourinary: Negative for urinary compaints Musculoskeletal: Negative for musculoskeletal complaints Neurological: Negative for headache All other ROS negative  ____________________________________________   PHYSICAL EXAM:  VITAL SIGNS: ED Triage Vitals [06/03/20 0759]  Enc Vitals Group     BP 111/61     Pulse Rate 65     Resp 16     Temp      Temp src      SpO2 94 %     Weight      Height      Head  Circumference      Peak Flow      Pain Score      Pain Loc      Pain Edu?      Excl. in [place]?    Constitutional: Alert and oriented. Well appearing and in no distress. Eyes: Normal exam ENT      Head: Normocephalic and atraumatic.      Mouth/Throat: Mucous membranes are moist. Cardiovascular: Normal rate, regular rhythm.  Respiratory: Normal respiratory effort without tachypnea nor retractions. Breath sounds are clear  Gastrointestinal: Soft and nontender. No distention.  Musculoskeletal: Nontender with normal range of motion in all extremities. No lower extremity tenderness or edema. Neurologic:  Normal speech and language. No gross focal neurologic deficits Skin:  Skin is warm, dry and intact.  Psychiatric: Mood and affect are normal.   ____________________________________________    EKG  EKG viewed and interpreted by myself shows a normal sinus rhythm at 64 bpm with a narrow QRS, normal axis, normal intervals, no concerning ST changes.  Reassuring EKG.  ____________________________________________    RADIOLOGY  Chest x-ray negative  ____________________________________________   INITIAL IMPRESSION / ASSESSMENT AND PLAN / ED COURSE  Pertinent labs & imaging results that were available during my care of the patient were reviewed by me and considered in my medical decision making (see chart for details).   Patient presents emergency department for chest pain.  Pain started around 1 AM this morning.  Patient does admit to alcohol use overnight as well.  No shortness of breath.  We will check labs including cardiac enzymes and a lipase.  We will obtain chest x-ray, EKG continue to closely monitor.  Patient agreeable to plan of care.  Patient's work-up shows 2 - troponins.  Slightly elevated alcohol level.  Patient was noted to be hypoglycemic although the patient remained alert and oriented throughout.  Patient was given dextrose and has been eating without any issues.   Patient's repeat CBGs have been elevated accordingly.  Patient denies any chest pain at this time.  Patient's lab work otherwise did show an elevated anion gap of 17.  Patient was given IV fluids.  Elevated LFTs are consistent with alcohol use pattern.  Normal bilirubin.  Completely benign abdominal exam including right upper quadrant.  Spoke to the patient regarding my typical chest pain return precautions, patient will return if he develops any chest or abdominal pain.  Otherwise we'll discharge home with PCP follow-up.  Mark Mack was evaluated in Emergency Department on 06/03/2020 for the symptoms described in the history of present illness. He was evaluated in the context of the global COVID-19 pandemic, which necessitated consideration that the patient might be at risk for infection with the SARS-CoV-2 virus that causes COVID-19. Institutional protocols and algorithms that pertain to the evaluation of patients at risk for COVID-19 are in  a state of rapid change based on information released by regulatory bodies including the CDC and federal and state organizations. These policies and algorithms were followed during the patient's care in the ED.  ____________________________________________   FINAL CLINICAL IMPRESSION(S) / ED DIAGNOSES  Chest pain   Harvest Dark, MD 06/03/20 1111

## 2022-09-02 DIAGNOSIS — Z0000 Encounter for general adult medical examination without abnormal findings: Secondary | ICD-10-CM | POA: Diagnosis not present

## 2022-09-02 DIAGNOSIS — E7800 Pure hypercholesterolemia, unspecified: Secondary | ICD-10-CM | POA: Diagnosis not present

## 2022-09-02 DIAGNOSIS — Z136 Encounter for screening for cardiovascular disorders: Secondary | ICD-10-CM | POA: Diagnosis not present

## 2022-09-02 DIAGNOSIS — I10 Essential (primary) hypertension: Secondary | ICD-10-CM | POA: Diagnosis not present

## 2022-09-17 DIAGNOSIS — E7800 Pure hypercholesterolemia, unspecified: Secondary | ICD-10-CM | POA: Diagnosis not present

## 2022-09-17 DIAGNOSIS — I10 Essential (primary) hypertension: Secondary | ICD-10-CM | POA: Diagnosis not present

## 2022-09-17 DIAGNOSIS — Z23 Encounter for immunization: Secondary | ICD-10-CM | POA: Diagnosis not present

## 2023-08-31 ENCOUNTER — Encounter
Admission: EM | Disposition: A | Discharge status: Rehabilitation Facility | Payer: Self-pay | Source: Home / Self Care | Attending: Internal Medicine

## 2023-08-31 ENCOUNTER — Inpatient Hospital Stay
Admission: EM | Admit: 2023-08-31 | Discharge: 2023-09-08 | DRG: 280 | Disposition: A | Discharge status: Rehabilitation Facility | Payer: Medicare HMO | Attending: Internal Medicine | Admitting: Internal Medicine

## 2023-08-31 ENCOUNTER — Inpatient Hospital Stay: Payer: Self-pay

## 2023-08-31 DIAGNOSIS — N1831 Chronic kidney disease, stage 3a: Secondary | ICD-10-CM | POA: Diagnosis present

## 2023-08-31 DIAGNOSIS — I469 Cardiac arrest, cause unspecified: Secondary | ICD-10-CM | POA: Diagnosis present

## 2023-08-31 DIAGNOSIS — E7800 Pure hypercholesterolemia, unspecified: Secondary | ICD-10-CM | POA: Diagnosis present

## 2023-08-31 DIAGNOSIS — K859 Acute pancreatitis without necrosis or infection, unspecified: Secondary | ICD-10-CM | POA: Diagnosis present

## 2023-08-31 DIAGNOSIS — R0489 Hemorrhage from other sites in respiratory passages: Secondary | ICD-10-CM | POA: Diagnosis present

## 2023-08-31 DIAGNOSIS — F05 Delirium due to known physiological condition: Secondary | ICD-10-CM | POA: Diagnosis not present

## 2023-08-31 DIAGNOSIS — G9341 Metabolic encephalopathy: Secondary | ICD-10-CM | POA: Diagnosis present

## 2023-08-31 DIAGNOSIS — Z7902 Long term (current) use of antithrombotics/antiplatelets: Secondary | ICD-10-CM

## 2023-08-31 DIAGNOSIS — M6282 Rhabdomyolysis: Secondary | ICD-10-CM | POA: Diagnosis present

## 2023-08-31 DIAGNOSIS — I2119 ST elevation (STEMI) myocardial infarction involving other coronary artery of inferior wall: Secondary | ICD-10-CM | POA: Diagnosis present

## 2023-08-31 DIAGNOSIS — Z7982 Long term (current) use of aspirin: Secondary | ICD-10-CM

## 2023-08-31 DIAGNOSIS — R5381 Other malaise: Secondary | ICD-10-CM | POA: Diagnosis not present

## 2023-08-31 DIAGNOSIS — Z7189 Other specified counseling: Secondary | ICD-10-CM | POA: Diagnosis not present

## 2023-08-31 DIAGNOSIS — N179 Acute kidney failure, unspecified: Secondary | ICD-10-CM | POA: Diagnosis present

## 2023-08-31 DIAGNOSIS — I088 Other rheumatic multiple valve diseases: Secondary | ICD-10-CM | POA: Diagnosis not present

## 2023-08-31 DIAGNOSIS — S27321A Contusion of lung, unilateral, initial encounter: Secondary | ICD-10-CM | POA: Diagnosis present

## 2023-08-31 DIAGNOSIS — F1910 Other psychoactive substance abuse, uncomplicated: Secondary | ICD-10-CM | POA: Diagnosis not present

## 2023-08-31 DIAGNOSIS — F1290 Cannabis use, unspecified, uncomplicated: Secondary | ICD-10-CM | POA: Diagnosis present

## 2023-08-31 DIAGNOSIS — D696 Thrombocytopenia, unspecified: Secondary | ICD-10-CM | POA: Diagnosis present

## 2023-08-31 DIAGNOSIS — F09 Unspecified mental disorder due to known physiological condition: Secondary | ICD-10-CM | POA: Diagnosis not present

## 2023-08-31 DIAGNOSIS — G9340 Encephalopathy, unspecified: Secondary | ICD-10-CM | POA: Diagnosis not present

## 2023-08-31 DIAGNOSIS — I213 ST elevation (STEMI) myocardial infarction of unspecified site: Secondary | ICD-10-CM | POA: Diagnosis not present

## 2023-08-31 DIAGNOSIS — J449 Chronic obstructive pulmonary disease, unspecified: Secondary | ICD-10-CM | POA: Diagnosis present

## 2023-08-31 DIAGNOSIS — I493 Ventricular premature depolarization: Secondary | ICD-10-CM | POA: Diagnosis present

## 2023-08-31 DIAGNOSIS — I5030 Unspecified diastolic (congestive) heart failure: Secondary | ICD-10-CM | POA: Diagnosis not present

## 2023-08-31 DIAGNOSIS — H539 Unspecified visual disturbance: Secondary | ICD-10-CM | POA: Diagnosis not present

## 2023-08-31 DIAGNOSIS — F1010 Alcohol abuse, uncomplicated: Secondary | ICD-10-CM | POA: Diagnosis present

## 2023-08-31 DIAGNOSIS — F17210 Nicotine dependence, cigarettes, uncomplicated: Secondary | ICD-10-CM | POA: Diagnosis present

## 2023-08-31 DIAGNOSIS — M1610 Unilateral primary osteoarthritis, unspecified hip: Secondary | ICD-10-CM | POA: Diagnosis present

## 2023-08-31 DIAGNOSIS — E874 Mixed disorder of acid-base balance: Secondary | ICD-10-CM | POA: Diagnosis present

## 2023-08-31 DIAGNOSIS — E872 Acidosis, unspecified: Secondary | ICD-10-CM | POA: Diagnosis present

## 2023-08-31 DIAGNOSIS — F1490 Cocaine use, unspecified, uncomplicated: Secondary | ICD-10-CM | POA: Diagnosis present

## 2023-08-31 DIAGNOSIS — D62 Acute posthemorrhagic anemia: Secondary | ICD-10-CM | POA: Diagnosis present

## 2023-08-31 DIAGNOSIS — J9602 Acute respiratory failure with hypercapnia: Secondary | ICD-10-CM | POA: Diagnosis present

## 2023-08-31 DIAGNOSIS — I129 Hypertensive chronic kidney disease with stage 1 through stage 4 chronic kidney disease, or unspecified chronic kidney disease: Secondary | ICD-10-CM | POA: Diagnosis present

## 2023-08-31 DIAGNOSIS — Z96643 Presence of artificial hip joint, bilateral: Secondary | ICD-10-CM | POA: Diagnosis present

## 2023-08-31 DIAGNOSIS — G931 Anoxic brain damage, not elsewhere classified: Secondary | ICD-10-CM | POA: Diagnosis not present

## 2023-08-31 DIAGNOSIS — I10 Essential (primary) hypertension: Secondary | ICD-10-CM | POA: Diagnosis present

## 2023-08-31 DIAGNOSIS — Z951 Presence of aortocoronary bypass graft: Secondary | ICD-10-CM

## 2023-08-31 DIAGNOSIS — K219 Gastro-esophageal reflux disease without esophagitis: Secondary | ICD-10-CM | POA: Diagnosis present

## 2023-08-31 DIAGNOSIS — J690 Pneumonitis due to inhalation of food and vomit: Secondary | ICD-10-CM | POA: Diagnosis present

## 2023-08-31 DIAGNOSIS — I517 Cardiomegaly: Secondary | ICD-10-CM | POA: Diagnosis not present

## 2023-08-31 DIAGNOSIS — K7200 Acute and subacute hepatic failure without coma: Secondary | ICD-10-CM | POA: Diagnosis present

## 2023-08-31 DIAGNOSIS — I25118 Atherosclerotic heart disease of native coronary artery with other forms of angina pectoris: Secondary | ICD-10-CM | POA: Diagnosis present

## 2023-08-31 DIAGNOSIS — J9601 Acute respiratory failure with hypoxia: Secondary | ICD-10-CM | POA: Diagnosis present

## 2023-08-31 DIAGNOSIS — I2510 Atherosclerotic heart disease of native coronary artery without angina pectoris: Secondary | ICD-10-CM | POA: Diagnosis present

## 2023-08-31 DIAGNOSIS — Z515 Encounter for palliative care: Secondary | ICD-10-CM

## 2023-08-31 DIAGNOSIS — G4700 Insomnia, unspecified: Secondary | ICD-10-CM | POA: Diagnosis present

## 2023-08-31 DIAGNOSIS — R569 Unspecified convulsions: Secondary | ICD-10-CM | POA: Diagnosis not present

## 2023-08-31 DIAGNOSIS — E876 Hypokalemia: Secondary | ICD-10-CM | POA: Diagnosis not present

## 2023-08-31 DIAGNOSIS — I3139 Other pericardial effusion (noninflammatory): Secondary | ICD-10-CM | POA: Diagnosis not present

## 2023-08-31 DIAGNOSIS — G928 Other toxic encephalopathy: Secondary | ICD-10-CM | POA: Diagnosis present

## 2023-08-31 DIAGNOSIS — R042 Hemoptysis: Secondary | ICD-10-CM | POA: Diagnosis present

## 2023-08-31 DIAGNOSIS — Z79899 Other long term (current) drug therapy: Secondary | ICD-10-CM

## 2023-08-31 HISTORY — DX: Atherosclerotic heart disease of native coronary artery without angina pectoris: I25.10

## 2023-08-31 HISTORY — DX: Osteoarthritis of hip, unspecified: M16.9

## 2023-08-31 HISTORY — PX: LEFT HEART CATH AND CORONARY ANGIOGRAPHY: CATH118249

## 2023-08-31 HISTORY — DX: Tobacco use: Z72.0

## 2023-08-31 HISTORY — PX: CORONARY/GRAFT ACUTE MI REVASCULARIZATION: CATH118305

## 2023-08-31 LAB — POCT I-STAT EG7
Acid-base deficit: 20 mmol/L — ABNORMAL HIGH (ref 0.0–2.0)
Bicarbonate: 15 mmol/L — ABNORMAL LOW (ref 20.0–28.0)
Calcium, Ion: 1.13 mmol/L — ABNORMAL LOW (ref 1.15–1.40)
HCT: 36 % — ABNORMAL LOW (ref 39.0–52.0)
Hemoglobin: 12.2 g/dL — ABNORMAL LOW (ref 13.0–17.0)
O2 Saturation: 33 %
Potassium: 4.3 mmol/L (ref 3.5–5.1)
Sodium: 129 mmol/L — ABNORMAL LOW (ref 135–145)
TCO2: 18 mmol/L — ABNORMAL LOW (ref 22–32)
pCO2, Ven: 88.2 mm[Hg] (ref 44–60)
pH, Ven: 6.839 — CL (ref 7.25–7.43)
pO2, Ven: 37 mm[Hg] (ref 32–45)

## 2023-08-31 LAB — BLOOD GAS, ARTERIAL
Acid-base deficit: 13.8 mmol/L — ABNORMAL HIGH (ref 0.0–2.0)
Acid-base deficit: 14.6 mmol/L — ABNORMAL HIGH (ref 0.0–2.0)
Acid-base deficit: 13.6 mmol/L — ABNORMAL HIGH (ref 0.0–2.0)
Acid-base deficit: 14.9 mmol/L — ABNORMAL HIGH (ref 0.0–2.0)
Bicarbonate: 16.6 mmol/L — ABNORMAL LOW (ref 20.0–28.0)
Bicarbonate: 15.9 mmol/L — ABNORMAL LOW (ref 20.0–28.0)
Bicarbonate: 15.6 mmol/L — ABNORMAL LOW (ref 20.0–28.0)
Bicarbonate: 15.1 mmol/L — ABNORMAL LOW (ref 20.0–28.0)
FIO2: 100 %
FIO2: 100 %
FIO2: 90 %
FIO2: 80 %
MECHVT: 450 mL
MECHVT: 450 mL
MECHVT: 450 mL
MECHVT: 450 mL
Mechanical Rate: 30
Mechanical Rate: 32
Mechanical Rate: 33
Mechanical Rate: 33
O2 Saturation: 78.4 %
O2 Saturation: 97.1 %
O2 Saturation: 96.6 %
O2 Saturation: 88.9 %
PEEP: 14 cmH2O
PEEP: 14 cmH2O
PEEP: 12 cmH2O
PEEP: 14 cmH2O
Patient temperature: 37
Patient temperature: 37
Patient temperature: 37
Patient temperature: 37
pCO2 arterial: 56 mm[Hg] — ABNORMAL HIGH (ref 32–48)
pCO2 arterial: 56 mm[Hg] — ABNORMAL HIGH (ref 32–48)
pCO2 arterial: 48 mm[Hg] (ref 32–48)
pCO2 arterial: 51 mm[Hg] — ABNORMAL HIGH (ref 32–48)
pH, Arterial: 7.08 — CL (ref 7.35–7.45)
pH, Arterial: 7.06 — CL (ref 7.35–7.45)
pH, Arterial: 7.12 — CL (ref 7.35–7.45)
pH, Arterial: 7.08 — CL (ref 7.35–7.45)
pO2, Arterial: 53 mm[Hg] — ABNORMAL LOW (ref 83–108)
pO2, Arterial: 86 mm[Hg] (ref 83–108)
pO2, Arterial: 78 mm[Hg] — ABNORMAL LOW (ref 83–108)
pO2, Arterial: 59 mm[Hg] — ABNORMAL LOW (ref 83–108)

## 2023-08-31 LAB — CBC WITH DIFFERENTIAL/PLATELET
Abs Immature Granulocytes: 0.8 10*3/uL — ABNORMAL HIGH (ref 0.00–0.07)
Abs Immature Granulocytes: 0.28 10*3/uL — ABNORMAL HIGH (ref 0.00–0.07)
Basophils Absolute: 0.1 10*3/uL (ref 0.0–0.1)
Basophils Absolute: 0.1 10*3/uL (ref 0.0–0.1)
Basophils Relative: 1 %
Basophils Relative: 0 %
Eosinophils Absolute: 0.1 10*3/uL (ref 0.0–0.5)
Eosinophils Absolute: 0 10*3/uL (ref 0.0–0.5)
Eosinophils Relative: 1 %
Eosinophils Relative: 0 %
HCT: 43.6 % (ref 39.0–52.0)
HCT: 37.4 % — ABNORMAL LOW (ref 39.0–52.0)
Hemoglobin: 13 g/dL (ref 13.0–17.0)
Hemoglobin: 12 g/dL — ABNORMAL LOW (ref 13.0–17.0)
Immature Granulocytes: 9 %
Immature Granulocytes: 2 %
Lymphocytes Relative: 53 %
Lymphocytes Relative: 9 %
Lymphs Abs: 4.7 10*3/uL — ABNORMAL HIGH (ref 0.7–4.0)
Lymphs Abs: 1.3 10*3/uL (ref 0.7–4.0)
MCH: 27.9 pg (ref 26.0–34.0)
MCH: 28.5 pg (ref 26.0–34.0)
MCHC: 29.8 g/dL — ABNORMAL LOW (ref 30.0–36.0)
MCHC: 32.1 g/dL (ref 30.0–36.0)
MCV: 93.6 fL (ref 80.0–100.0)
MCV: 88.8 fL (ref 80.0–100.0)
Monocytes Absolute: 0.5 10*3/uL (ref 0.1–1.0)
Monocytes Absolute: 1.4 10*3/uL — ABNORMAL HIGH (ref 0.1–1.0)
Monocytes Relative: 5 %
Monocytes Relative: 10 %
Neutro Abs: 2.7 10*3/uL (ref 1.7–7.7)
Neutro Abs: 11.8 10*3/uL — ABNORMAL HIGH (ref 1.7–7.7)
Neutrophils Relative %: 31 %
Neutrophils Relative %: 79 %
Platelets: 157 10*3/uL (ref 150–400)
Platelets: 168 10*3/uL (ref 150–400)
RBC: 4.66 MIL/uL (ref 4.22–5.81)
RBC: 4.21 MIL/uL — ABNORMAL LOW (ref 4.22–5.81)
RDW: 14.8 % (ref 11.5–15.5)
RDW: 15.4 % (ref 11.5–15.5)
Smear Review: NORMAL
Smear Review: NORMAL
WBC: 8.9 10*3/uL (ref 4.0–10.5)
WBC: 14.8 10*3/uL — ABNORMAL HIGH (ref 4.0–10.5)
nRBC: 0.4 % — ABNORMAL HIGH (ref 0.0–0.2)
nRBC: 0.1 % (ref 0.0–0.2)

## 2023-08-31 LAB — BLOOD GAS, VENOUS
Acid-base deficit: 15 mmol/L — ABNORMAL HIGH (ref 0.0–2.0)
Bicarbonate: 16.9 mmol/L — ABNORMAL LOW (ref 20.0–28.0)
O2 Saturation: 74.4 %
Patient temperature: 37
pCO2, Ven: 67 mm[Hg] — ABNORMAL HIGH (ref 44–60)
pH, Ven: 7.01 — CL (ref 7.25–7.43)
pO2, Ven: 49 mm[Hg] — ABNORMAL HIGH (ref 32–45)

## 2023-08-31 LAB — POCT I-STAT 7, (LYTES, BLD GAS, ICA,H+H)
Acid-base deficit: 20 mmol/L — ABNORMAL HIGH (ref 0.0–2.0)
Bicarbonate: 13.8 mmol/L — ABNORMAL LOW (ref 20.0–28.0)
Calcium, Ion: 1.13 mmol/L — ABNORMAL LOW (ref 1.15–1.40)
HCT: 37 % — ABNORMAL LOW (ref 39.0–52.0)
Hemoglobin: 12.6 g/dL — ABNORMAL LOW (ref 13.0–17.0)
O2 Saturation: 69 %
Potassium: 4.3 mmol/L (ref 3.5–5.1)
Sodium: 132 mmol/L — ABNORMAL LOW (ref 135–145)
TCO2: 16 mmol/L — ABNORMAL LOW (ref 22–32)
pCO2 arterial: 75.8 mm[Hg] (ref 32–48)
pH, Arterial: 6.867 — CL (ref 7.35–7.45)
pO2, Arterial: 63 mm[Hg] — ABNORMAL LOW (ref 83–108)

## 2023-08-31 LAB — COMPREHENSIVE METABOLIC PANEL
ALT: 152 U/L — ABNORMAL HIGH (ref 0–44)
ALT: 168 U/L — ABNORMAL HIGH (ref 0–44)
AST: 285 U/L — ABNORMAL HIGH (ref 15–41)
AST: 567 U/L — ABNORMAL HIGH (ref 15–41)
Albumin: 2.5 g/dL — ABNORMAL LOW (ref 3.5–5.0)
Albumin: 2.1 g/dL — ABNORMAL LOW (ref 3.5–5.0)
Alkaline Phosphatase: 134 U/L — ABNORMAL HIGH (ref 38–126)
Alkaline Phosphatase: 122 U/L (ref 38–126)
Anion gap: 21 — ABNORMAL HIGH (ref 5–15)
Anion gap: 12 (ref 5–15)
BUN: 15 mg/dL (ref 8–23)
BUN: 21 mg/dL (ref 8–23)
CO2: 18 mmol/L — ABNORMAL LOW (ref 22–32)
CO2: 16 mmol/L — ABNORMAL LOW (ref 22–32)
Calcium: 10.1 mg/dL (ref 8.9–10.3)
Calcium: 6.8 mg/dL — ABNORMAL LOW (ref 8.9–10.3)
Chloride: 104 mmol/L (ref 98–111)
Chloride: 109 mmol/L (ref 98–111)
Creatinine, Ser: 1.24 mg/dL (ref 0.61–1.24)
Creatinine, Ser: 1.9 mg/dL — ABNORMAL HIGH (ref 0.61–1.24)
GFR, Estimated: >60 mL/min (ref >60)
GFR, Estimated: 38 mL/min — ABNORMAL LOW (ref >60)
Glucose, Bld: 349 mg/dL — ABNORMAL HIGH (ref 70–99)
Glucose, Bld: 227 mg/dL — ABNORMAL HIGH (ref 70–99)
Potassium: 4.2 mmol/L (ref 3.5–5.1)
Potassium: 3 mmol/L — ABNORMAL LOW (ref 3.5–5.1)
Sodium: 143 mmol/L (ref 135–145)
Sodium: 137 mmol/L (ref 135–145)
Total Bilirubin: 0.7 mg/dL (ref 0.3–1.2)
Total Bilirubin: 0.7 mg/dL (ref 0.3–1.2)
Total Protein: 5.4 g/dL — ABNORMAL LOW (ref 6.5–8.1)
Total Protein: 4.5 g/dL — ABNORMAL LOW (ref 6.5–8.1)

## 2023-08-31 LAB — HEMOGLOBIN A1C
Hgb A1c MFr Bld: 5.8 % — ABNORMAL HIGH (ref 4.8–5.6)
Mean Plasma Glucose: 119.76 mg/dL

## 2023-08-31 LAB — LIPID PANEL
Cholesterol: 153 mg/dL (ref 0–200)
HDL: 62 mg/dL (ref >40)
LDL Cholesterol: 57 mg/dL (ref 0–99)
Total CHOL/HDL Ratio: 2.5 {ratio}
Triglycerides: 171 mg/dL — ABNORMAL HIGH (ref <150)
VLDL: 34 mg/dL (ref 0–40)

## 2023-08-31 LAB — URINE DRUG SCREEN, QUALITATIVE (ARMC ONLY)
Amphetamines, Ur Screen: NOT DETECTED
Barbiturates, Ur Screen: NOT DETECTED
Benzodiazepine, Ur Scrn: POSITIVE — AB
Cannabinoid 50 Ng, Ur ~~LOC~~: POSITIVE — AB
Cocaine Metabolite,Ur ~~LOC~~: POSITIVE — AB
MDMA (Ecstasy)Ur Screen: NOT DETECTED
Methadone Scn, Ur: NOT DETECTED
Opiate, Ur Screen: NOT DETECTED
Phencyclidine (PCP) Ur S: NOT DETECTED
Tricyclic, Ur Screen: NOT DETECTED

## 2023-08-31 LAB — COOXEMETRY PANEL
Carboxyhemoglobin: 1.3 % (ref 0.5–1.5)
Methemoglobin: <0.7 % (ref 0.0–1.5)
O2 Saturation: 71.4 %
Total hemoglobin: 12.5 g/dL (ref 12.0–16.0)
Total oxygen content: 70.1 %

## 2023-08-31 LAB — RAPID HIV SCREEN (HIV 1/2 AB+AG)
HIV 1/2 Antibodies: NONREACTIVE
HIV-1 P24 Antigen - HIV24: NONREACTIVE

## 2023-08-31 LAB — MRSA NEXT GEN BY PCR, NASAL: MRSA by PCR Next Gen: NOT DETECTED

## 2023-08-31 LAB — PROCALCITONIN: Procalcitonin: <0.1 ng/mL

## 2023-08-31 LAB — PROTIME-INR
INR: 1.5 — ABNORMAL HIGH (ref 0.8–1.2)
Prothrombin Time: 18.2 s — ABNORMAL HIGH (ref 11.4–15.2)

## 2023-08-31 LAB — LIPASE, BLOOD: Lipase: 35 U/L (ref 11–51)

## 2023-08-31 LAB — MAGNESIUM: Magnesium: 2.6 mg/dL — ABNORMAL HIGH (ref 1.7–2.4)

## 2023-08-31 LAB — TROPONIN I (HIGH SENSITIVITY)
Troponin I (High Sensitivity): 190 ng/L (ref <18)
Troponin I (High Sensitivity): 4043 ng/L (ref <18)

## 2023-08-31 LAB — GLUCOSE, CAPILLARY
Glucose-Capillary: 274 mg/dL — ABNORMAL HIGH (ref 70–99)
Glucose-Capillary: 296 mg/dL — ABNORMAL HIGH (ref 70–99)

## 2023-08-31 LAB — PHOSPHORUS: Phosphorus: 9.5 mg/dL — ABNORMAL HIGH (ref 2.5–4.6)

## 2023-08-31 LAB — LACTIC ACID, PLASMA
Lactic Acid, Venous: 6.9 mmol/L (ref 0.5–1.9)
Lactic Acid, Venous: 6.9 mmol/L (ref 0.5–1.9)

## 2023-08-31 LAB — APTT: aPTT: 67 s — ABNORMAL HIGH (ref 24–36)

## 2023-08-31 SURGERY — CORONARY/GRAFT ACUTE MI REVASCULARIZATION
Anesthesia: Moderate Sedation

## 2023-08-31 MED ORDER — ORAL CARE MOUTH RINSE: 15.0000 mL | OROMUCOSAL | Status: DC | PRN

## 2023-08-31 MED ORDER — NOREPINEPHRINE BITARTRATE 1 MG/ML IV SOLN
INTRAVENOUS | Status: AC | PRN
  Administered 2023-08-31: 10 ug/min via INTRAVENOUS

## 2023-08-31 MED ORDER — VASOPRESSIN 20 UNITS/100 ML INFUSION FOR SHOCK
0.0000 [IU]/min | INTRAVENOUS | Status: DC
  Administered 2023-08-31 – 2023-09-02 (×4): 0.03 [IU]/min via INTRAVENOUS
  Filled 2023-08-31 (×4): qty 100

## 2023-08-31 MED ORDER — EPINEPHRINE 1 MG/10ML IJ SOSY
1.0000 mg | PREFILLED_SYRINGE | Freq: Once | INTRAMUSCULAR | Status: AC
  Administered 2023-08-31: 1 mg via INTRAVENOUS

## 2023-08-31 MED ORDER — NOREPINEPHRINE 4 MG/250ML-% IV SOLN
0.0000 ug/min | INTRAVENOUS | Status: DC
  Administered 2023-08-31 (×2): 25 ug/min via INTRAVENOUS
  Filled 2023-08-31 (×4): qty 250

## 2023-08-31 MED ORDER — ONDANSETRON HCL 4 MG/2ML IJ SOLN: 4.0000 mg | Freq: Four times a day (QID) | INTRAMUSCULAR | Status: DC | PRN

## 2023-08-31 MED ORDER — CHLORHEXIDINE GLUCONATE CLOTH 2 % EX PADS
6.0000 | MEDICATED_PAD | Freq: Every day | CUTANEOUS | Status: DC
  Administered 2023-09-01 – 2023-09-06 (×6): 6 via TOPICAL

## 2023-08-31 MED ORDER — SODIUM BICARBONATE 8.4 % IV SOLN
150.0000 meq | Freq: Once | INTRAVENOUS | Status: AC
  Filled 2023-08-31: qty 50

## 2023-08-31 MED ORDER — HEPARIN SODIUM (PORCINE) 1000 UNIT/ML IJ SOLN
INTRAMUSCULAR | Status: AC
  Filled 2023-08-31: qty 10

## 2023-08-31 MED ORDER — POLYETHYLENE GLYCOL 3350 17 G PO PACK: 17.0000 g | PACK | Freq: Every day | ORAL | Status: DC | PRN

## 2023-08-31 MED ORDER — ORAL CARE MOUTH RINSE
15.0000 mL | OROMUCOSAL | Status: DC
  Administered 2023-08-31 – 2023-09-03 (×33): 15 mL via OROMUCOSAL

## 2023-08-31 MED ORDER — LABETALOL HCL 5 MG/ML IV SOLN: 10.0000 mg | INTRAVENOUS | Status: DC | PRN

## 2023-08-31 MED ORDER — HYDROCORTISONE SOD SUC (PF) 100 MG IJ SOLR
50.0000 mg | Freq: Four times a day (QID) | INTRAMUSCULAR | Status: DC
  Administered 2023-08-31 – 2023-09-03 (×10): 50 mg via INTRAVENOUS
  Filled 2023-08-31 (×6): qty 2
  Filled 2023-08-31: qty 1
  Filled 2023-08-31 (×3): qty 2

## 2023-08-31 MED ORDER — DOCUSATE SODIUM 100 MG PO CAPS: 100.0000 mg | ORAL_CAPSULE | Freq: Two times a day (BID) | ORAL | Status: DC | PRN

## 2023-08-31 MED ORDER — DOCUSATE SODIUM 50 MG/5ML PO LIQD
100.0000 mg | Freq: Two times a day (BID) | ORAL | Status: DC
  Administered 2023-09-02: 100 mg
  Filled 2023-08-31 (×2): qty 10

## 2023-08-31 MED ORDER — TRANEXAMIC ACID 1000 MG/10ML IV SOLN
1000.0000 mg | Freq: Once | INTRAVENOUS | Status: AC
  Administered 2023-08-31: 1000 mg via TOPICAL
  Filled 2023-08-31: qty 10

## 2023-08-31 MED ORDER — PROPOFOL 1000 MG/100ML IV EMUL
INTRAVENOUS | Status: AC
  Filled 2023-08-31: qty 100

## 2023-08-31 MED ORDER — PIPERACILLIN-TAZOBACTAM 3.375 G IVPB
3.3750 g | Freq: Three times a day (TID) | INTRAVENOUS | Status: DC
  Administered 2023-08-31 – 2023-09-05 (×14): 3.375 g via INTRAVENOUS
  Filled 2023-08-31 (×14): qty 50

## 2023-08-31 MED ORDER — HEPARIN (PORCINE) IN NACL 2000-0.9 UNIT/L-% IV SOLN
INTRAVENOUS | Status: DC | PRN
  Administered 2023-08-31: 1000 mL

## 2023-08-31 MED ORDER — SODIUM CHLORIDE 0.9 % IV SOLN
INTRAVENOUS | Status: DC | PRN
  Administered 2023-08-31: 1.75 mg/kg/h via INTRAVENOUS

## 2023-08-31 MED ORDER — BIVALIRUDIN TRIFLUOROACETATE 250 MG IV SOLR
INTRAVENOUS | Status: AC
  Filled 2023-08-31: qty 250

## 2023-08-31 MED ORDER — POLYETHYLENE GLYCOL 3350 17 G PO PACK: 17.0000 g | PACK | Freq: Every day | ORAL | Status: DC

## 2023-08-31 MED ORDER — HEPARIN (PORCINE) 25000 UT/250ML-% IV SOLN: 800.0000 [IU]/h | INTRAVENOUS | Status: DC

## 2023-08-31 MED ORDER — EPINEPHRINE 1 MG/10ML IJ SOSY
PREFILLED_SYRINGE | INTRAMUSCULAR | Status: AC
  Administered 2023-08-31: 1 mg
  Filled 2023-08-31: qty 10

## 2023-08-31 MED ORDER — FENTANYL CITRATE PF 50 MCG/ML IJ SOSY
50.0000 ug | PREFILLED_SYRINGE | INTRAMUSCULAR | Status: DC | PRN
Start: 2023-08-31 — End: 2023-09-02

## 2023-08-31 MED ORDER — MIDAZOLAM HCL 2 MG/2ML IJ SOLN
1.0000 mg | INTRAMUSCULAR | Status: DC | PRN
  Administered 2023-08-31 – 2023-09-01 (×2): 2 mg via INTRAVENOUS
  Filled 2023-08-31 (×2): qty 2

## 2023-08-31 MED ORDER — MILRINONE LOAD VIA INFUSION: 50.0000 ug/kg | Freq: Once | INTRAVENOUS | Status: DC

## 2023-08-31 MED ORDER — HYDRALAZINE HCL 20 MG/ML IJ SOLN: 10.0000 mg | INTRAMUSCULAR | Status: DC | PRN

## 2023-08-31 MED ORDER — FENTANYL 2500MCG IN NS 250ML (10MCG/ML) PREMIX INFUSION
INTRAVENOUS | Status: AC
  Filled 2023-08-31: qty 250

## 2023-08-31 MED ORDER — HEPARIN (PORCINE) IN NACL 1000-0.9 UT/500ML-% IV SOLN
INTRAVENOUS | Status: AC
  Filled 2023-08-31: qty 1000

## 2023-08-31 MED ORDER — VASOPRESSIN 20 UNITS/100 ML INFUSION FOR SHOCK
0.0000 [IU]/min | INTRAVENOUS | Status: DC
  Filled 2023-08-31 (×2): qty 100

## 2023-08-31 MED ORDER — PANTOPRAZOLE SODIUM 40 MG IV SOLR
40.0000 mg | Freq: Every day | INTRAVENOUS | Status: DC
  Administered 2023-08-31 – 2023-09-03 (×4): 40 mg via INTRAVENOUS
  Filled 2023-08-31 (×4): qty 10

## 2023-08-31 MED ORDER — CANGRELOR TETRASODIUM 50 MG IV SOLR
INTRAVENOUS | Status: AC
  Filled 2023-08-31: qty 50

## 2023-08-31 MED ORDER — ACETAMINOPHEN 325 MG PO TABS
650.0000 mg | ORAL_TABLET | ORAL | Status: DC | PRN
Start: 2023-08-31 — End: 2023-08-31

## 2023-08-31 MED ORDER — NOREPINEPHRINE 16 MG/250ML-% IV SOLN
0.0000 ug/min | INTRAVENOUS | Status: DC
  Administered 2023-08-31 – 2023-09-01 (×2): 40 ug/min via INTRAVENOUS
  Administered 2023-09-01: 21.333 ug/min via INTRAVENOUS
  Administered 2023-09-01: 60 ug/min via INTRAVENOUS
  Administered 2023-09-01: 40 ug/min via INTRAVENOUS
  Administered 2023-09-02: 8 ug/min via INTRAVENOUS
  Filled 2023-08-31 (×6): qty 250

## 2023-08-31 MED ORDER — FENTANYL 2500MCG IN NS 250ML (10MCG/ML) PREMIX INFUSION
0.0000 ug/h | INTRAVENOUS | Status: DC
  Administered 2023-08-31: 50 ug/h via INTRAVENOUS
  Administered 2023-09-01: 150 ug/h via INTRAVENOUS
  Administered 2023-09-02: 90 ug/h via INTRAVENOUS
  Filled 2023-08-31 (×2): qty 250

## 2023-08-31 MED ORDER — METHYLENE BLUE (ANTIDOTE) 1 % IV SOLN
2.0000 mg/kg | Freq: Once | Status: AC
  Administered 2023-08-31: 133 mg via INTRAVENOUS
  Filled 2023-08-31: qty 13.3

## 2023-08-31 MED ORDER — TRANEXAMIC ACID 1000 MG/10ML IV SOLN
1000.0000 mg | Freq: Once | INTRAVENOUS | Status: DC
  Administered 2023-08-31: 1000 mg via INTRAVENOUS
  Filled 2023-08-31: qty 10

## 2023-08-31 MED ORDER — PROPOFOL BOLUS VIA INFUSION
INTRAVENOUS | Status: DC | PRN
  Administered 2023-08-31: 30 mg via INTRAVENOUS

## 2023-08-31 MED ORDER — CANGRELOR BOLUS VIA INFUSION
INTRAVENOUS | Status: DC | PRN
  Administered 2023-08-31: 2070 ug via INTRAVENOUS

## 2023-08-31 MED ORDER — SODIUM BICARBONATE 8.4 % IV SOLN
INTRAVENOUS | Status: AC
  Administered 2023-08-31: 150 meq via INTRAVENOUS
  Filled 2023-08-31: qty 100

## 2023-08-31 MED ORDER — EPINEPHRINE HCL 5 MG/250ML IV SOLN IN NS
0.5000 ug/min | INTRAVENOUS | Status: DC
  Administered 2023-08-31: 20 ug/min via INTRAVENOUS
  Administered 2023-08-31: 19 ug/min via INTRAVENOUS
  Administered 2023-09-01: 15 ug/min via INTRAVENOUS
  Filled 2023-08-31 (×4): qty 250

## 2023-08-31 MED ORDER — PROPOFOL 1000 MG/100ML IV EMUL
5.0000 ug/kg/min | INTRAVENOUS | Status: DC
Start: 2023-08-31 — End: 2023-09-02
  Administered 2023-08-31 – 2023-09-02 (×5): 20 ug/kg/min via INTRAVENOUS
  Filled 2023-08-31 (×4): qty 100

## 2023-08-31 MED ORDER — BIVALIRUDIN BOLUS VIA INFUSION - CUPID
INTRAVENOUS | Status: DC | PRN
  Administered 2023-08-31: 51.75 mg via INTRAVENOUS

## 2023-08-31 MED ORDER — NOREPINEPHRINE BITARTRATE 1 MG/ML IV SOLN
INTRAVENOUS | Status: AC
  Filled 2023-08-31: qty 4

## 2023-08-31 MED ORDER — SODIUM CHLORIDE 0.9 % WEIGHT BASED INFUSION: 1.0000 mL/kg/h | INTRAVENOUS | Status: DC

## 2023-08-31 MED ORDER — MILRINONE LACTATE IN DEXTROSE 20-5 MG/100ML-% IV SOLN
0.1250 ug/kg/min | INTRAVENOUS | Status: DC
  Administered 2023-08-31: 0.125 ug/kg/min via INTRAVENOUS
  Filled 2023-08-31: qty 100

## 2023-08-31 MED ORDER — SODIUM CHLORIDE 0.9 % IV SOLN
INTRAVENOUS | Status: DC | PRN
  Administered 2023-08-31: 4 ug/kg/min via INTRAVENOUS

## 2023-08-31 MED ORDER — IOHEXOL 350 MG/ML SOLN
75.0000 mL | Freq: Once | INTRAVENOUS | Status: AC | PRN
  Administered 2023-08-31: 75 mL via INTRAVENOUS

## 2023-08-31 MED ORDER — INSULIN ASPART 100 UNIT/ML IJ SOLN
0.0000 [IU] | INTRAMUSCULAR | Status: DC
  Administered 2023-08-31 – 2023-09-01 (×3): 3 [IU] via SUBCUTANEOUS
  Filled 2023-08-31 (×3): qty 1

## 2023-08-31 MED ORDER — FENTANYL CITRATE PF 50 MCG/ML IJ SOSY
50.0000 ug | PREFILLED_SYRINGE | INTRAMUSCULAR | Status: AC | PRN
  Administered 2023-08-31 – 2023-09-01 (×3): 50 ug via INTRAVENOUS
  Filled 2023-08-31: qty 1

## 2023-08-31 MED ORDER — IOHEXOL 300 MG/ML  SOLN
INTRAMUSCULAR | Status: DC | PRN
  Administered 2023-08-31: 290 mL

## 2023-08-31 MED ORDER — SODIUM CHLORIDE 0.9 % IV SOLN
3.0000 g | Freq: Four times a day (QID) | INTRAVENOUS | Status: DC
  Filled 2023-08-31 (×2): qty 8

## 2023-08-31 MED ORDER — STERILE WATER FOR INJECTION IV SOLN
INTRAVENOUS | Status: DC
  Filled 2023-08-31: qty 1000
  Filled 2023-08-31: qty 150
  Filled 2023-08-31 (×6): qty 1000

## 2023-08-31 SURGICAL SUPPLY — 19 items
CATH INFINITI 5FR MULTPACK ANG (CATHETERS) IMPLANT
CATH SWAN GANZ 7F STRAIGHT (CATHETERS) IMPLANT
CATH VISTA GUIDE 6FR JR4 (CATHETERS) IMPLANT
CATH VISTA GUIDE 6FR XB3 (CATHETERS) IMPLANT
DEVICE CLOSURE MYNXGRIP 6/7F (Vascular Products) IMPLANT
DRAPE BRACHIAL (DRAPES) IMPLANT
GUIDEWIRE EMER 3M J .025X150CM (WIRE) IMPLANT
KIT ENCORE 26 ADVANTAGE (KITS) IMPLANT
NDL PERC 18GX7CM (NEEDLE) IMPLANT
NEEDLE PERC 18GX7CM (NEEDLE) ×1 IMPLANT
PACK CARDIAC CATH (CUSTOM PROCEDURE TRAY) ×1 IMPLANT
PROTECTION STATION PRESSURIZED (MISCELLANEOUS) ×1
SET ATX-X65L (MISCELLANEOUS) IMPLANT
SHEATH AVANTI 6FR X 11CM (SHEATH) IMPLANT
SHEATH AVANTI 7FRX11 (SHEATH) IMPLANT
STATION PROTECTION PRESSURIZED (MISCELLANEOUS) IMPLANT
TUBING CIL FLEX 10 FLL-RA (TUBING) IMPLANT
WIRE G HI TQ BMW 190 (WIRE) IMPLANT
WIRE GUIDERIGHT .035X150 (WIRE) IMPLANT

## 2023-08-31 NOTE — ED Notes (Signed)
Compressions paused to switch to [place]

## 2023-08-31 NOTE — ED Notes (Signed)
50 mEq Sodium bicarbonate given thru Left antecubital

## 2023-08-31 NOTE — Consult Note (Signed)
[facility] Cardiology    SUBJECTIVE: Patient presented with respiratory arrest initially complaining of chest pain indigestion pain last night but today got worse and had a witnessed arrest brought in by EMS initial EKG was concerning for STEMI patient was coded in the field for over 30 minutes and brought in and subsequently code STEMI was activated and subsequently was set up to be taken to the lab.  Patient remains intubated sedated with respiratory failure and hypoxemia Case discussed with ICU as well as ER Dr.   Theodoro Kos:   08/31/23 1225 08/31/23 1230 08/31/23 1235 08/31/23 1240  BP:  (!) 79/65  (!) 68/58  Pulse: (!) 36 (!) 36 (!) 32 (!) 36  Resp: (!) 38 (!) 36 (!) 33 (!) 32  SpO2: (!) 63% (!) 59% (!) 59% (!) 60%  Weight:        No intake or output data in the 24 hours ending 08/31/23 1306    PHYSICAL EXAM  General: Well developed, well nourished, in no acute distress HEENT:  Normocephalic and atramatic Neck:  No JVD.  Lungs: Clear bilaterally to auscultation and percussion. Heart: Irregular irregular. Normal S1 and S2 without gallops or murmurs.  Abdomen: Bowel sounds are positive, abdomen soft and non-tender  Msk:  Back normal, normal gait. Normal strength and tone for age. Extremities: No clubbing, cyanosis or edema.   Neuro: Unresponsive sedated Psych:  Good affect, responds appropriately unresponsive   LABS: Basic Metabolic Panel: Recent Labs    08/31/23 0952 08/31/23 1200  NA 143 132*  129*  K 4.2 4.3  4.3  CL 104  --   CO2 18*  --   GLUCOSE 349*  --   BUN 15  --   CREATININE 1.24  --   CALCIUM 10.1  --   MG 2.6*  --   PHOS 9.5*  --    Liver Function Tests: Recent Labs    08/31/23 0952  AST 285*  ALT 152*  ALKPHOS 134*  BILITOT 0.7  PROT 5.4*  ALBUMIN 2.5*   No results for input(s): "LIPASE", "AMYLASE" in the last 72 hours. CBC: Recent Labs    08/31/23 0952 08/31/23 1200  WBC 8.9  --   NEUTROABS 2.7  --   HGB 13.0 12.6*  12.2*  HCT 43.6 37.0*   36.0*  MCV 93.6  --   PLT 157  --    Cardiac Enzymes: No results for input(s): "CKTOTAL", "CKMB", "CKMBINDEX", "TROPONINI" in the last 72 hours. BNP: Invalid input(s): "POCBNP" D-Dimer: No results for input(s): "DDIMER" in the last 72 hours. Hemoglobin A1C: No results for input(s): "HGBA1C" in the last 72 hours. Fasting Lipid Panel: Recent Labs    08/31/23 0952  CHOL 153  HDL 62  LDLCALC 57  TRIG 171*  CHOLHDL 2.5   Thyroid Function Tests: No results for input(s): "TSH", "T4TOTAL", "T3FREE", "THYROIDAB" in the last 72 hours.  Invalid input(s): "FREET3" Anemia Panel: No results for input(s): "VITAMINB12", "FOLATE", "FERRITIN", "TIBC", "IRON", "RETICCTPCT" in the last 72 hours.  CT Angio Chest Pulmonary Embolism (PE) W or WO Contrast  Result Date: 08/31/2023 CLINICAL DATA:  Witnessed cardiac arrest EXAM: CT ANGIOGRAPHY CHEST WITH CONTRAST TECHNIQUE: Multidetector CT imaging of the chest was performed using the standard protocol during bolus administration of intravenous contrast. Multiplanar CT image reconstructions and MIPs were obtained to evaluate the vascular anatomy. RADIATION DOSE REDUCTION: This exam was performed according to the departmental dose-optimization program which includes automated exposure control, adjustment of the mA and/or kV  according to patient size and/or use of iterative reconstruction technique. CONTRAST:  75mL OMNIPAQUE IOHEXOL 350 MG/ML SOLN COMPARISON:  None Available. FINDINGS: Cardiovascular: No evidence of pulmonary embolus. Normal heart size. No pericardial effusion. Normal caliber thoracic aorta with moderate atherosclerotic disease. 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 Mediastinum/Nodes: Esophagus and thyroid are unremarkable. No enlarged lymph nodes seen in the chest. Lungs/Pleura: ETT is in the distal trachea. Consolidations of the bilateral lower lobes. Central and upper lung predominant patchy ground-glass opacities. No  pneumothorax. Upper Abdomen: High attenuation stranding adjacent to the partially visualized pancreas, concerning for hemorrhagic pancreatitis. Musculoskeletal: Prior median sternotomy with intact sternal wires. Nondisplaced fractures of the anterior right fourth through sixth ribs. Review of the MIP images confirms the above findings. IMPRESSION: 1. No evidence of pulmonary embolus. 2. Consolidations of the bilateral lower lobes and central and upper lobe predominant ground-glass opacities, likely due to a combination of aspiration and pulmonary edema. 3. High attenuation stranding adjacent to the partially visualized pancreas, concerning for hemorrhagic pancreatitis. Correlate with lipase and consider dedicated abdomen and pelvis CT. 4. Nondisplaced fractures of the anterior right fourth through sixth ribs. 5. 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 6. Aortic Atherosclerosis (ICD10-I70.0). Electronically Signed   By: Allegra Lai M.D.   On: 08/31/2023 12:39     EKG diffuse ST elevation inferior laterally rate of 400 incomplete right bundle transport  Echo pending  ASSESSMENT AND PLAN:  Principal Problem:   Cardiac arrest [facility]) Active Problems:   STEMI (ST elevation myocardial infarction) (HCC) Respiratory arrest hypoxemia Hyperlipidemia Altered mental status Multivessel coronary disease History of coronary bypass surgery   Plan Agree with admission to ICU level care Continue respiratory support with vent wean when appropriate Correct electrolytes and acidosis Echocardiogram for assessment left ventricular function wall motion Multivessel coronary disease with occluded bypass graft but reasonable perfusion from revascularization Continue critical care support for respiratory failure and acidosis Follow-up troponins EKGs Short-term anticoagulation with [facility] Plavix post MI 1.  Successful PCI and stent for STEMI treated medically    Alwyn Pea,  MD 08/31/2023 1:06 PM

## 2023-08-31 NOTE — Procedures (Signed)
Arterial Catheter Insertion Procedure Note  Mark Mack  [id number]  17-Mar-1956  Date:08/31/23  Time:4:24 PM    Provider Performing: Lianne Bushy Mikaeel Petrow    Procedure: Insertion of Arterial Line ([id number]) with US guidance ([id number])   Indication(s) Blood pressure monitoring and/or need for frequent ABGs  Consent Risks of the procedure as well as the alternatives and risks of each were explained to the patient and/or caregiver.  Consent for the procedure was obtained and is signed in the bedside chart  Anesthesia None   Time Out Verified patient identification, verified procedure, site/side was marked, verified correct patient position, special equipment/implants available, medications/allergies/relevant history reviewed, required imaging and test results available.   Sterile Technique Maximal sterile technique including full sterile barrier drape, hand hygiene, sterile gown, sterile gloves, mask, hair covering, sterile ultrasound probe cover (if used).   Procedure Description Area of catheter insertion was cleaned with chlorhexidine and draped in sterile fashion. With real-time ultrasound guidance an arterial catheter was placed into the left radial artery.  Appropriate arterial tracings confirmed on monitor.     Complications/Tolerance None; patient tolerated the procedure well.   EBL Minimal   Specimen(s) None  [name], MD [facility] 08/31/2023 4:25 PM

## 2023-08-31 NOTE — ED Notes (Addendum)
Pt to cath lab accompanied by [facility] RT CN and Vet RN. Propofol started at 55mcg/kf/min.

## 2023-08-31 NOTE — Progress Notes (Signed)
Upon initial assessment at 7:30 PM, patient was GCS 3 and RASS -5. Instructed to turn off sedation by [name] NP.   At 8:30 PM, patient began to slowly wake up initially just becoming increasingly tachypneic and desynchronous with the vent. At this time, patient woke and opened eyes to voice. Patient was able to localize pain and made consistent eye contact with the provider. Attending, NP, and family at the bedside to witness. Instructed to restart fentanyl and propofol at previous doses & administer fentanyl bolus off the pump - see eMAR.

## 2023-08-31 NOTE — Progress Notes (Signed)
  Chaplain On-Call responded to Code STEMI notification at 0915 hours.  The patient was receiving lengthy CPR and was attended by the Medical Team.  Chaplain learned from Dr. Juliann Pares that the patient is being moved to the Cath Lab.  Chaplain assured Staff of availability if requested.  Chaplain Evelena Peat M.Div., [facility]

## 2023-08-31 NOTE — ED Notes (Addendum)
1mg  Epinephrine given in Left antecubital

## 2023-08-31 NOTE — Progress Notes (Signed)
Carollee Herter RT and Herbert Seta RT assisted Dr. Aundria Rud with emergent bedside bronchoscopy. Time out and emergent consent obtained prior to procedure by appropriate personnel. Ambu disposable bronchoscope used.  Time scope in: 1638 Time scope last out: 1648  Pt sats before and after procedure was 50-60% on 14PEEP and 100% FiO2. Copious amounts of bright red blood was suctioned from airway by Dr. Aundria Rud.   (5ml) of 1mg /52ml Epinepherine placed down bronchoscope into each lung and 500 mg (5ml) TXA placed down bronchoscope into each lung per Dr. Aundria Rud order.

## 2023-08-31 NOTE — CV Procedure (Signed)
STEMI note posterior breath brought to the Cath Lab for evaluation patient had a witnessed arrest had prolonged CPR intubated sedated was restored blood pressure about 110 systolic heart rate of 80 when he was brought to Cath Lab sats were in the 80s on 100%  Right groin approach  Left ventriculogram showed preserved left ventricular function mildly reduced at around 45%  Coronaries Native left main diffuse disease 50% LAD large 75 proximal diffuse 50-75 mid to distal Circumflex 100% occluded ostially  RCA 100% occluded proximally  Grafts LIMA to LAD widely patent SVG to circumflex widely patent SVG to circumflex  or diagonal ?occluded SVG to distal RCA widely patent  Intervention Attempted unsuccessful wire crossing to ostial circumflex Attempted unsuccessful wire crossing to proximal and mid right  Intervention aborted Angiomax contact rule out discontinue  Right heart cath performed Mean wedge of 25 Mean PA of 31 PA sat 33% Ao sat 69% LV 87/1 LVEDP 16 Ao 99/57 MAP 72  Swan removed Venous sheath left in place Mynx attempted for right femoral artery unsuccessful Right femoral artery held manually Patient transferred to ICU

## 2023-08-31 NOTE — ED Notes (Addendum)
1mg  Epinephrine given in interosseous catheter

## 2023-08-31 NOTE — Progress Notes (Signed)
  Chaplain On-Call responded to a page from the ICU Unit Mark Mack who reported the patient is in grave condition and the family is present.  Chaplain arrived on the Unit and received medical update from RN Myra. The patient's course of hospitalization today has included a Code STEMI in the ED for cardiac arrest; attempts at interventions in the Cath Lab; continuing acute care in the ICU.  Chaplain met four family members at bedside, and offered to be of service if they needed me. The family stated their appreciation, and that they preferred to be together at this time.  Chaplain assured family and Staff of availability if requested.  Chaplain Evelena Peat M.Div., [facility]

## 2023-08-31 NOTE — Progress Notes (Signed)
Pharmacy Antibiotic Note  Mark Mack is a 67 y.o. male admitted on 08/31/2023 with  aspiration pneumonia .  Pharmacy has been consulted for Zosyn dosing.  Plan: Zosyn 3.375g IV q8h (4 hour infusion).  Height: 5\' 6"  (167.6 cm) Weight: 66.5 kg (146 lb 9.7 oz) IBW/kg (Calculated) : 63.8  No data recorded.  Recent Labs  Lab 08/31/23 0952 08/31/23 1719  WBC 8.9 14.8*  CREATININE 1.24 1.90*  LATICACIDVEN  --  6.9*    Estimated Creatinine Clearance: 34.5 mL/min (A) (by C-G formula based on SCr of 1.9 mg/dL (H)).    No Known Allergies  Antimicrobials this admission: Zosyn 11/3 >>     Dose adjustments this admission:  Microbiology results:  Thank you for allowing pharmacy to be a part of this patient's care.  Clovia Cuff, PharmD, BCPS 08/31/2023 8:54 PM

## 2023-08-31 NOTE — Progress Notes (Signed)
RT called to room for Emergency traffic, CPR in progress. This RT obtained ventilator and brought in room, set up vent, Endotracheal tube, and video laryngoscope for possible intubation.

## 2023-08-31 NOTE — ED Notes (Signed)
Patient currently on ER lucas

## 2023-08-31 NOTE — Consult Note (Signed)
PHARMACY - ANTICOAGULATION CONSULT NOTE  Pharmacy Consult for Heparin  Indication: ACS/STEMI  Heparin Dosing Weight: 66.5 kg   Vital Signs: BP: 122/80 (11/03 1118) Pulse Rate: 81 (11/03 1118)  Labs: Recent Labs    08/31/23 0952 08/31/23 1200  HGB 13.0 12.6*  12.2*  HCT 43.6 37.0*  36.0*  PLT 157  --   APTT 67*  --   LABPROT 18.2*  --   INR 1.5*  --   CREATININE 1.24  --   TROPONINIHS 190*  --    Medical History: Past Medical History:  Diagnosis Date   Hypertension    Medications:  No PTA anticoagulants  Pt received bivalirudin and heparin during procedure  Assessment: Mark Mack is a 67 year old male who presented to the ER today after a witnessed cardiac arrest at home. EMS reports EKG concerning for inferolateral STEMI; patient brought to cath lab for right heart cath. Per cardiology, start heparin 2 hours after sheath removal.   Goal of Therapy:  Heparin level 0.3-0.7 units/ml Monitor platelets by anticoagulation protocol: Yes   Plan:  Start heparin infusion at 800 units/hr  Will avoid initial bolus as patient received bivalirudin & heparin during procedure Check HL 6 hours after initiation of heparin Monitor CBC daily   Littie Deeds, PharmD Pharmacy Resident  08/31/2023 12:07 PM

## 2023-08-31 NOTE — ED Triage Notes (Signed)
Patient comes in from home via [facility] after a witnessed cardiac arrest. According to EMS, the patients family began cpr, and he was receiving cpr on Fire department arrival. FD delivered one shock, EMS delivered 2-3 shocks and gave 300 of amniodaron, patient then went into  vfib, and 3-4 shocks delivered again by ems, and ROSC was regained. EKG  showed an extensive MI, EMS then lost pulses, EMS regained rosc again after cpr. EMS hung levo enroute to hospital, and lost pulses once more on arrival in ambulance bay. Patient currently on lucas and receiving manual respirations on arrival.

## 2023-08-31 NOTE — Procedures (Signed)
Bronchoscopy Procedure Note  Mark Mack  [id number]  [birth year]  Date:08/31/23  Time:4:58 PM   Provider Performing:Gwenn Teodoro   Procedure(s):  Flexible Bronchoscopy (82956) and Initial Therapeutic Aspiration of Tracheobronchial Tree (21308)  Indication(s) Bland alveolar hemorrhage  Consent Unable to obtain consent due to emergent nature of procedure.  Anesthesia Propofol + fentanyl   Time Out Verified patient identification, verified procedure, site/side was marked, verified correct patient position, special equipment/implants available, medications/allergies/relevant history reviewed, required imaging and test results available.   Sterile Technique Usual hand hygiene, masks, gowns, and gloves were used   Procedure Description Bronchoscope advanced through endotracheal tube and into airway. Airways were examined down to subsegmental level with findings noted below. Following diagnostic evaluation, therapeutic aspiration of blood was performed in the right and left mainstem bronchi. Following this, epinephrine was instilled in the airway (500 mcg in 5 mL in each lung - total of 1 mg) in addition to 1000 mg of tranexamic acid in 10 mL (5 mL in each lung).    Findings: Patient with bloody secretions in the ETT, bronchoscope advanced through the airway and bloody secretions noted throughout the tracheobronchial tree. These were therapeutically aspirated. Suspect bland alveolar hemorrhage secondary to lung contusion from CPR in addition to pulmonary edema.   Complications/Tolerance None; patient tolerated the procedure well. Chest X-ray is not needed post procedure.   Mark Chute, MD [facility] 08/31/2023 5:01 PM

## 2023-08-31 NOTE — ED Notes (Signed)
50 mEq/50 mL Sodium Bicarbonate given in Left ac

## 2023-08-31 NOTE — IPAL (Addendum)
  Interdisciplinary Goals of Care Family Meeting   Date carried out: 08/31/2023  Location of the meeting: Bedside  Member's involved: Physician and Family Member or next of kin  Durable Power of Attorney or acting medical decision maker: Wife, daughter and son    Discussion: We discussed goals of care for Mark Mack. Explained that he suffered a cardiac arrest with prolonged downtime. On exam, he is posturing. Explained he's in cardiogenic shock and respiratory failure with significant bilateral consolidations. Overall prognosis is poor, and his condition is very critical. Explained that proper neurological prognostication requires 72 hours, but his overall condition is very concerning for anoxic injury as well as multi-organ failure.   Code status:   Code Status: Limited: Do not attempt resuscitation (DNR) -DNR-LIMITED -Do Not Intubate/DNI    Disposition: Continue current acute care, DNR status  Time spent for the meeting: 25 minutes    [name], MD  08/31/2023, 1:20 PM

## 2023-08-31 NOTE — ED Provider Notes (Signed)
[facility] Provider Note    Event Date/Time   First MD Initiated Contact with Patient 08/31/23 [phone]     (approximate)  History   Chief Complaint: Cardiac Arrest  HPI  Mark Mack is a 67 y.o. male with an unknown past medical history who presents to the emergency department as a witnessed cardiac arrest at home.  According to EMS report cardiac arrest was witnessed by family and CPR was started prior to their arrival.  EMS states when they arrived they performed CPR for approximately 5 minutes and had return of spontaneous circulation.  After return of spontaneous circulation they did an EKG showing concerns for inferolateral STEMI and they activated a code STEMI.  They estimate CPR was started around 8:30 AM, however they continue to intermittently lose pulses and would resume CPR when needed.  EMS states they had return of spontaneous circulation for approximately 20 minutes or so however approximately 5 minutes before arrival to the emergency department they lost pulses once again and upon arrival to the emergency department patient has an i-gel device being bagged (with moderate pulmonary hemorrhage), Samuel Bouche device in place providing compressions and an IO line.  EMS reports multiple drugs and defibrillations given prior to arrival at the emergency department approximately 1 hour after initiation of CPR.  Physical Exam   Triage Vital Signs: ED Triage Vitals  Encounter Vitals Group     BP      Systolic BP Percentile      Diastolic BP Percentile      Pulse      Resp      Temp      Temp src      SpO2      Weight      Height      Head Circumference      Peak Flow      Pain Score      Pain Loc      Pain Education      Exclude from Growth Chart     Most recent vital signs: There were no vitals filed for this visit.  General: Unresponsive, 2 to 3 mm pupils bilaterally but not reacting CV:  Good pulses with chest compressions Resp:  Patient being  bagged with an i-gel device, pulmonary hemorrhage. Abd:  No distention. Other:  IO device in place in the right tibia.   ED Results / Procedures / Treatments   EKG  EKG viewed and interpreted by myself shows a normal sinus rhythm at 64 bpm with a narrow QRS, normal axis, normal intervals, no concerning ST changes.  RADIOLOGY  X-ray was not performed prior to Cath Lab   MEDICATIONS ORDERED IN ED: Medications  propofol (DIPRIVAN) 1000 MG/100ML infusion (has no administration in time range)     IMPRESSION / MDM / ASSESSMENT AND PLAN / ED COURSE  I reviewed the triage vital signs and the nursing notes.  Patient's presentation is most consistent with acute presentation with potential threat to life or bodily function.  Patient presents to the emergency department CPR in progress.  Overall 1 hour of intermittent CPR performed by family and then EMS.  On my arrival patient had an i-gel device with moderate pulmonary hemorrhage I removed I gel device was able to intubate.  Continued with Lucas chest compressions.  Nursing staff was able to obtain a left upper extremity 20-gauge IV.  We continued with medications including 3 Amps of epinephrine, 2 Amps of bicarb and 1 of  calcium.  We had return of spontaneous circulation and a repeat EKG performed although does not appear to show any ST elevation.  I did review the EKG from EMS which appear to show significant inferolateral ST elevation.  Dr. Juliann Pares of cardiology is in the room as well.  Given the concerning initial EKG he will be taken the patient to the cardiac catheterization lab.  Patient's blood pressure currently 175/97.  Great pulses palpated.  I have ordered labs although the patient taken to the cardiac catheterization lab prior to lab results.  I spoke to Dr. Aundria Rud of the ICU who will be admitting to his service after the Cath Lab.  INTUBATION Performed by: Minna Antis  Required items: required blood products, implants,  devices, and special equipment available Patient identity confirmed: provided demographic data and hospital-assigned identification number Time out: Immediately prior to procedure a "time out" was called to verify the correct patient, procedure, equipment, support staff and site/side marked as required.  Indications: Cardiac arrest  Intubation method: S4 Glidescope Laryngoscopy   Preoxygenation: 100% BVM  Sedatives: None Paralytic: None  Tube Size: 8.0 cuffed, I watched the tube passed between the vocal cords.  Post-procedure assessment: chest rise and ETCO2 monitor Breath sounds: equal and absent over the epigastrium Tube secured with: ETT holder Patient tolerated the procedure well with no immediate complications.  Cardiopulmonary Resuscitation (CPR) Procedure Note Directed/Performed by: Minna Antis I personally directed ancillary staff and/or performed CPR in an effort to regain return of spontaneous circulation and to maintain cardiac, neuro and systemic perfusion.   CRITICAL CARE Performed by: Minna Antis   Total critical care time: 30 minutes  Critical care time was exclusive of separately billable procedures and treating other patients.  Critical care was necessary to treat or prevent imminent or life-threatening deterioration.  Critical care was time spent personally by me on the following activities: development of treatment plan with patient and/or surrogate as well as nursing, discussions with consultants, evaluation of patient's response to treatment, examination of patient, obtaining history from patient or surrogate, ordering and performing treatments and interventions, ordering and review of laboratory studies, ordering and review of radiographic studies, pulse oximetry and re-evaluation of patient's condition.   FINAL CLINICAL IMPRESSION(S) / ED DIAGNOSES   Cardiac Arrest STEMI  Note:  This document was prepared using Dragon voice recognition  software and may include unintentional dictation errors.   Minna Antis, MD 08/31/23 1012

## 2023-08-31 NOTE — H&P (Signed)
NAME:  Mark Mack, MRN:  [id number], DOB:  09-04-1956, LOS: 0 ADMISSION DATE:  08/31/2023, CONSULTATION DATE:  08/31/2023 REFERRING MD:  Minna Antis, CHIEF COMPLAINT:  Cardiac Arrest   History of Present Illness:   67 year old male presenting to the hospital with cardiac arrest.  Patient had a witnessed cardiac arrest at home and EMS was activated. CPR initiated at home prior to EMS, which continued, with ROSC achieved. He had multiple rounds of CPR secondary to continued loss of pulses. EKG was notable for inferior STEMI, and he is taken emergently to the cath lab for [facility] and will be admitted to the ICU afterwards.  In the cath lab, LHC performed showed occlusions in multiple vessels, though wire could not be passed and no revascularization was performed. Patient underwent a RHC with PCWP of 25 mmHg and mixed venous sat showing 33% saturation, with 69% saturation in the arterial circulation. This correlates to a cardiac output of 2.5 and cardiac index of 2.1.  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 He also has a history of HTN and smoking.  Pertinent  Medical History  -HTN -CAD s/p CABG (4 vessel, 2019)  Significant Hospital Events: Including procedures, antibiotic start and stop dates in addition to other pertinent events   08/31/2023:   Interim History / Subjective:  unresponsive  Objective   There were no vitals taken for this visit.       No intake or output data in the 24 hours ending 08/31/23 0951 There were no vitals filed for this visit.  Examination: Physical Exam Constitutional:      General: He is not in acute distress.    Appearance: He is ill-appearing.  Cardiovascular:     Rate and Rhythm: Normal rate and regular rhythm.     Heart sounds: Normal heart sounds.  Pulmonary:     Breath sounds: Normal breath sounds.     Comments: Ventilated breath sounds Neurological:     Mental Status: He is disoriented.        Assessment & Plan:   Neurology #Toxic Metabolic Encephalopathy  Post cardiac arrest. Patient is non-responsive but is overbreathing the ventilator, with pupils are small and sluggish. No sign of herniation on exam, and imaging doesn't show catastrophic brain injury. Patient initially with posture positioning, and subsequently had non-purposeful movements. Discussed with family, explained that 72 hours are needed to make an accurate prognostication of neurological recovery.  -Maintain a RASS goal of -1 -propofol and fentanyl to maintain RASS goal -Avoid sedating medications as able -Daily wake up assessment  Cardiovascular #Cardiac Arrest #STEMI #CAD s/p CABG  Presented with cardiac arrest with inferior STEMI noted on presentation. Underwent LHC with inability to pass wire through circumflex or RCA. PCWP of 25 mmHg and mixed venous sat showing 33% saturation, with 69% saturation in the arterial circulation. This correlates to a cardiac output of 2.5 and cardiac index of 2.1 overall consistent with cardiogenic shock. Patient's shock worsened throughout the day and he is on multiple vasopressors to maintain perfusion, with nor-epinephrine, epinephrine, and vasopressin. I have added an ionodilator with milrinone. Will augment blood pressure support with methylene blue and initiation of hydrocortisone. Needs heparin gtt for NSTEMI, but this is limited by his pulmonary hemorrhage (see below). Appreciate input from cardiology.  -TTE -on nor-epi, epi, vaso, goal MAP > 65 mmHg -continue milrinone -add methylene blue -trend troponin -trend lactic acid  Pulmonary #Acute Hypoxic and Hypercapnic Respiratory Failure #Pulmonary  contusion #Bland alveolar hemorrhage  Respiratory failure in the setting of cardiac arrest and cardiogenic shock. Developed severe respiratory failure with respiratory and metabolic acidosis and extreme difficulty to oxygenate. He is maintained on FiO2 of 90%, PEEP  of 14, with a respiratory rate of 33 (with increased flow) and his repeat blood gas continues to show acidosis. He also developed bloody secretions through the tube today, with concern for bland alveolar hemorrhage requiring bronchoscopy and instillation of epinephrine and TXA in the tracheobronchial tree. Suspect this is secondary to pulmonary contusion and pulmonary edema from cardiogenic shock.  -Full vent support, implement lung protective strategies -monitor for extrinsic PEEP given high respiratory rate -goal plateau < 30 cm H2O, unable to maintain given decreased compliance and severe acidosis -goal driving pressure < 15 [id number] -daily CXR, frequent ABG's -Implement VAP Bundle  Gastrointestinal  PPI for SUP. Maintain NPO   Renal #AKI on CKD #Metabolic Acidosis  Presented with AKI on CKD secondary to cardiac arrest and profound shock. Continues to make urine which is re-assuring. Will initiate bicarbonate infusion given metabolic acidosis.  -bicarbonate gtt -monitor ABG's -avoid nephrotoxins  Endocrine  ICU glycemic protocol. Hydrocortisone 50 mg q6hours given profound shock  Hem/Onc  Needs heparin gtt given STEMI, but contraindicated secondary to his pulmonary hemorrhage. Hold for now, re-assess tomorrow. Monitor CBC for hemoglobin and platelet count drops  ID  Will initiate Zosyn for prophylaxis against aspiration pneumonia per [facility]. Check MRSA screen.   Best Practice (right click and "Reselect all SmartList Selections" daily)   Diet/type: NPO DVT prophylaxis: systemic heparin, on hold given hemorrhage GI prophylaxis: PPI Lines: Central line, Arterial Line, and yes and it is still needed Foley:  Yes, and it is still needed Code Status:  full code Last date of multidisciplinary goals of care discussion [08/31/2023]  Labs   CBC: No results for input(s): "WBC", "NEUTROABS", "HGB", "HCT", "MCV", "PLT" in the last 168 hours.  Basic Metabolic Panel: No results  for input(s): "NA", "K", "CL", "CO2", "GLUCOSE", "BUN", "CREATININE", "CALCIUM", "MG", "PHOS" in the last 168 hours. GFR: CrCl cannot be calculated (Patient's most recent lab result is older than the maximum 21 days allowed.). No results for input(s): "PROCALCITON", "WBC", "LATICACIDVEN" in the last 168 hours.  Liver Function Tests: No results for input(s): "AST", "ALT", "ALKPHOS", "BILITOT", "PROT", "ALBUMIN" in the last 168 hours. No results for input(s): "LIPASE", "AMYLASE" in the last 168 hours. No results for input(s): "AMMONIA" in the last 168 hours.  ABG No results found for: "PHART", "PCO2ART", "PO2ART", "HCO3", "TCO2", "ACIDBASEDEF", "O2SAT"   Coagulation Profile: No results for input(s): "INR", "PROTIME" in the last 168 hours.  Cardiac Enzymes: No results for input(s): "CKTOTAL", "CKMB", "CKMBINDEX", "TROPONINI" in the last 168 hours.  HbA1C: No results found for: "HGBA1C"  CBG: No results for input(s): "GLUCAP" in the last 168 hours.  Review of Systems:   Unable to obtain  Past Medical History:  He,  has a past medical history of Hypertension.   Surgical History:   Past Surgical History:  Procedure Laterality Date   CARDIAC SURGERY       Social History:   reports that he quit smoking about 5 years ago. He has never used smokeless tobacco.   Family History:  His family history is not on file.   Allergies No Known Allergies   Home Medications  Prior to Admission medications   Medication Sig Start Date End Date Taking? Authorizing Provider  acetaminophen (TYLENOL) 500 MG tablet Take 1,000 mg  by mouth every 6 (six) hours as needed.     [provider]  aspirin EC 81 MG tablet Take 81 mg by mouth daily.     [provider]  docusate sodium (COLACE) 100 MG capsule Take 1 capsule (100 mg total) by mouth daily. Patient not taking: Reported on 05/28/2018 04/03/18   Ramonita Lab, MD  esomeprazole (NEXIUM) 20 MG packet Take 20 mg by mouth daily  before breakfast.  04/01/18 05/01/18  [provider]  ezetimibe (ZETIA) 10 MG tablet Take 10 mg by mouth daily. 05/22/18   [provider]  ferrous sulfate 325 (65 FE) MG EC tablet Take 1 tablet (325 mg total) by mouth daily with breakfast. Patient not taking: Reported on 05/28/2018 04/03/18 04/03/19  Ramonita Lab, MD  furosemide (LASIX) 20 MG tablet Take 20 mg by mouth daily. 03/29/18   [provider]  metoprolol succinate (TOPROL XL) 50 MG 24 hr tablet Take 50 mg by mouth daily.  02/13/18 02/13/19  [provider]  ranitidine (ZANTAC) 150 MG tablet  05/26/18   [provider]  rosuvastatin (CRESTOR) 20 MG tablet Take 1 tablet (20 mg total) by mouth at bedtime. Patient not taking: Reported on 05/28/2018 04/03/18 04/03/19  Ramonita Lab, MD  senna-docusate (SENOKOT-S) 8.6-50 MG tablet Take 1 tablet by mouth at bedtime as needed for mild constipation. Patient not taking: Reported on 05/28/2018 04/03/18   Ramonita Lab, MD     Critical care time: 110 minutes    [name], MD [facility] 08/31/2023 8:35 PM

## 2023-08-31 NOTE — Procedures (Signed)
Central Venous Catheter Insertion Procedure Note  Mark Mack  [id number]  Jan 16, 1956  Date:08/31/23  Time:4:25 PM   Provider Performing:Millie Forde   Procedure: Insertion of Non-tunneled Central Venous [id number]) with US guidance (56213)   Indication(s) Medication administration  Consent Risks of the procedure as well as the alternatives and risks of each were explained to the patient and/or caregiver.  Consent for the procedure was obtained and is signed in the bedside chart  Anesthesia Topical only with 1% lidocaine   Timeout Verified patient identification, verified procedure, site/side was marked, verified correct patient position, special equipment/implants available, medications/allergies/relevant history reviewed, required imaging and test results available.  Sterile Technique Maximal sterile technique including full sterile barrier drape, hand hygiene, sterile gown, sterile gloves, mask, hair covering, sterile ultrasound probe cover (if used).  Procedure Description Area of catheter insertion was cleaned with chlorhexidine and draped in sterile fashion.  With real-time ultrasound guidance a central venous catheter was placed into the right internal jugular vein. Nonpulsatile blood flow and easy flushing noted in all ports.  The catheter was sutured in place and sterile dressing applied.  Complications/Tolerance None; patient tolerated the procedure well. Chest X-ray is ordered to verify placement for internal jugular or subclavian cannulation.   Chest x-ray is not ordered for femoral cannulation.  EBL Minimal  Specimen(s) None  [name], MD [facility] 08/31/2023 4:25 PM

## 2023-08-31 NOTE — Consult Note (Signed)
Pharmacy Antibiotic Note  Mark Mack is a 67 y.o. male admitted on 08/31/2023 with apiration pneumonia s/p cardiac arrest.  Pharmacy has been consulted for Unasyn dosing.  Plan: Initiate unasyn 3 gram Q6H     No data recorded.  Recent Labs  Lab 08/31/23 0952  WBC 8.9    CrCl cannot be calculated (Patient's most recent lab result is older than the maximum 21 days allowed.).    No Known Allergies  Antimicrobials this admission: 11/3 Unasyn >>    Dose adjustments this admission:   Microbiology results: 11/3 BCx: sent  Thank you for allowing pharmacy to be a part of this patient's care.  Sharen Hones, PharmD, BCPS Clinical Pharmacist   08/31/2023 10:44 AM

## 2023-08-31 NOTE — ED Notes (Signed)
1G of calcium given via left antecubital

## 2023-08-31 NOTE — Progress Notes (Addendum)
1213 Patient received from Cath Lab via bed. Patient unresponsive to voice and pain. Lungs rhonchi throughtout. Unable to get temperature.. Verbal order not to re-warm patient with bair huggar blankets because of cardiac arrest. 1230 Propofol stopped per verbal order. Levophed increased for low blood pressure. 1330 Family in to see patient.  1400 Patient made DNR. Noted to have decorticate posturing.1530 Patient starting moving all extremities. Does not track with eyes, Does not follow commands. Does not stop moving. Family very upset. Patient given Midazolam  2mg  given. Fentanyl and Propofol started to calm patient. Patient calmed quickly. Right internal jugular TLC placed and right radial art line placed per Dr. Aundria Rud. Now Dr. Aundria Rud advised family to wait 72 hours before they withdraw care.1603  Patient bleeding profusely from ETT.  Bronchoscopy per Dr. Aundria Rud but no source of blood noted. Blood suctioned out of lungs bilaterally. Blood pressure and oxygen saturation up and down until 1930. See flow sheets.

## 2023-08-31 NOTE — ED Notes (Signed)
1mg Epinephrine given

## 2023-09-01 ENCOUNTER — Inpatient Hospital Stay: Payer: Medicare HMO

## 2023-09-01 ENCOUNTER — Ambulatory Visit: Payer: Medicare HMO

## 2023-09-01 ENCOUNTER — Inpatient Hospital Stay (HOSPITAL_COMMUNITY)
Admit: 2023-09-01 | Discharge: 2023-09-01 | Disposition: A | Discharge status: Home / Self Care | Payer: Medicare HMO | Attending: Internal Medicine | Admitting: Internal Medicine

## 2023-09-01 ENCOUNTER — Other Ambulatory Visit: Payer: Medicare HMO

## 2023-09-01 DIAGNOSIS — I088 Other rheumatic multiple valve diseases: Secondary | ICD-10-CM

## 2023-09-01 DIAGNOSIS — I469 Cardiac arrest, cause unspecified: Secondary | ICD-10-CM

## 2023-09-01 DIAGNOSIS — I517 Cardiomegaly: Secondary | ICD-10-CM

## 2023-09-01 DIAGNOSIS — R569 Unspecified convulsions: Secondary | ICD-10-CM

## 2023-09-01 DIAGNOSIS — I5030 Unspecified diastolic (congestive) heart failure: Secondary | ICD-10-CM

## 2023-09-01 DIAGNOSIS — I3139 Other pericardial effusion (noninflammatory): Secondary | ICD-10-CM

## 2023-09-01 LAB — CBC
HCT: 39.8 % (ref 39.0–52.0)
Hemoglobin: 12.5 g/dL — ABNORMAL LOW (ref 13.0–17.0)
MCH: 27.8 pg (ref 26.0–34.0)
MCHC: 31.4 g/dL (ref 30.0–36.0)
MCV: 88.4 fL (ref 80.0–100.0)
Platelets: 143 10*3/uL — ABNORMAL LOW (ref 150–400)
RBC: 4.5 MIL/uL (ref 4.22–5.81)
RDW: 15.2 % (ref 11.5–15.5)
WBC: 15.4 10*3/uL — ABNORMAL HIGH (ref 4.0–10.5)
nRBC: 0.2 % (ref 0.0–0.2)

## 2023-09-01 LAB — PROTIME-INR
INR: 1.2 (ref 0.8–1.2)
Prothrombin Time: 15.1 s (ref 11.4–15.2)

## 2023-09-01 LAB — BLOOD GAS, ARTERIAL
Acid-Base Excess: 3.3 mmol/L — ABNORMAL HIGH (ref 0.0–2.0)
Acid-Base Excess: 4.2 mmol/L — ABNORMAL HIGH (ref 0.0–2.0)
Acid-base deficit: 7.4 mmol/L — ABNORMAL HIGH (ref 0.0–2.0)
Acid-base deficit: 3.6 mmol/L — ABNORMAL HIGH (ref 0.0–2.0)
Bicarbonate: 21.1 mmol/L (ref 20.0–28.0)
Bicarbonate: 23.9 mmol/L (ref 20.0–28.0)
Bicarbonate: 29.1 mmol/L — ABNORMAL HIGH (ref 20.0–28.0)
Bicarbonate: 28.4 mmol/L — ABNORMAL HIGH (ref 20.0–28.0)
FIO2: 80 %
FIO2: 60 %
FIO2: 50 %
FIO2: 50 %
MECHVT: 450 mL
MECHVT: 550 mL
MECHVT: 550 mL
MECHVT: 550 mL
Mechanical Rate: 28
Mechanical Rate: 20
Mechanical Rate: 20
Mechanical Rate: 20
O2 Saturation: 98 %
O2 Saturation: 91.8 %
O2 Saturation: 91.3 %
O2 Saturation: 93.5 %
PEEP: 14 cmH2O
PEEP: 14 cmH2O
PEEP: 12 cmH2O
PEEP: 10 cmH2O
Patient temperature: 37
Patient temperature: 37
Patient temperature: 37
Patient temperature: 37
pCO2 arterial: 54 mm[Hg] — ABNORMAL HIGH (ref 32–48)
pCO2 arterial: 52 mm[Hg] — ABNORMAL HIGH (ref 32–48)
pCO2 arterial: 48 mm[Hg] (ref 32–48)
pCO2 arterial: 40 mm[Hg] (ref 32–48)
pH, Arterial: 7.2 — ABNORMAL LOW (ref 7.35–7.45)
pH, Arterial: 7.27 — ABNORMAL LOW (ref 7.35–7.45)
pH, Arterial: 7.39 (ref 7.35–7.45)
pH, Arterial: 7.46 — ABNORMAL HIGH (ref 7.35–7.45)
pO2, Arterial: 85 mm[Hg] (ref 83–108)
pO2, Arterial: 56 mm[Hg] — ABNORMAL LOW (ref 83–108)
pO2, Arterial: 56 mm[Hg] — ABNORMAL LOW (ref 83–108)
pO2, Arterial: 57 mm[Hg] — ABNORMAL LOW (ref 83–108)

## 2023-09-01 LAB — HEPATIC FUNCTION PANEL
ALT: 161 U/L — ABNORMAL HIGH (ref 0–44)
AST: 419 U/L — ABNORMAL HIGH (ref 15–41)
Albumin: 2.5 g/dL — ABNORMAL LOW (ref 3.5–5.0)
Alkaline Phosphatase: 93 U/L (ref 38–126)
Bilirubin, Direct: 0.2 mg/dL (ref 0.0–0.2)
Indirect Bilirubin: 0.6 mg/dL (ref 0.3–0.9)
Total Bilirubin: 0.8 mg/dL (ref <1.2)
Total Protein: 4.9 g/dL — ABNORMAL LOW (ref 6.5–8.1)

## 2023-09-01 LAB — ECHOCARDIOGRAM COMPLETE
AR max vel: 2.72 cm2
AV Area VTI: 2.59 cm2
AV Area mean vel: 2.52 cm2
AV Mean grad: 5 mm[Hg]
AV Peak grad: 8.9 mm[Hg]
Ao pk vel: 1.49 m/s
Area-P 1/2: 3.01 cm2
Height: 66 in
MV VTI: 2.9 cm2
S' Lateral: 2.3 cm
Weight: 2546.75 [oz_av]

## 2023-09-01 LAB — TROPONIN I (HIGH SENSITIVITY)
Troponin I (High Sensitivity): 6757 ng/L (ref <18)
Troponin I (High Sensitivity): 6665 ng/L (ref <18)
Troponin I (High Sensitivity): 8646 ng/L (ref <18)
Troponin I (High Sensitivity): 11365 ng/L (ref <18)
Troponin I (High Sensitivity): 14097 ng/L (ref <18)
Troponin I (High Sensitivity): 16113 ng/L (ref <18)

## 2023-09-01 LAB — LIPASE, BLOOD: Lipase: 308 U/L — ABNORMAL HIGH (ref 11–51)

## 2023-09-01 LAB — BASIC METABOLIC PANEL
Anion gap: 14 (ref 5–15)
Anion gap: 9 (ref 5–15)
BUN: 29 mg/dL — ABNORMAL HIGH (ref 8–23)
BUN: 34 mg/dL — ABNORMAL HIGH (ref 8–23)
CO2: 21 mmol/L — ABNORMAL LOW (ref 22–32)
CO2: 27 mmol/L (ref 22–32)
Calcium: 6.7 mg/dL — ABNORMAL LOW (ref 8.9–10.3)
Calcium: 6.3 mg/dL — CL (ref 8.9–10.3)
Chloride: 104 mmol/L (ref 98–111)
Chloride: 99 mmol/L (ref 98–111)
Creatinine, Ser: 2.28 mg/dL — ABNORMAL HIGH (ref 0.61–1.24)
Creatinine, Ser: 2.19 mg/dL — ABNORMAL HIGH (ref 0.61–1.24)
GFR, Estimated: 31 mL/min — ABNORMAL LOW (ref >60)
GFR, Estimated: 32 mL/min — ABNORMAL LOW (ref >60)
Glucose, Bld: 312 mg/dL — ABNORMAL HIGH (ref 70–99)
Glucose, Bld: 101 mg/dL — ABNORMAL HIGH (ref 70–99)
Potassium: 2.7 mmol/L — CL (ref 3.5–5.1)
Potassium: 4.8 mmol/L (ref 3.5–5.1)
Sodium: 139 mmol/L (ref 135–145)
Sodium: 135 mmol/L (ref 135–145)

## 2023-09-01 LAB — GLUCOSE, CAPILLARY
Glucose-Capillary: 241 mg/dL — ABNORMAL HIGH (ref 70–99)
Glucose-Capillary: 267 mg/dL — ABNORMAL HIGH (ref 70–99)
Glucose-Capillary: 223 mg/dL — ABNORMAL HIGH (ref 70–99)
Glucose-Capillary: 210 mg/dL — ABNORMAL HIGH (ref 70–99)
Glucose-Capillary: 170 mg/dL — ABNORMAL HIGH (ref 70–99)
Glucose-Capillary: 144 mg/dL — ABNORMAL HIGH (ref 70–99)
Glucose-Capillary: 102 mg/dL — ABNORMAL HIGH (ref 70–99)
Glucose-Capillary: 81 mg/dL (ref 70–99)
Glucose-Capillary: 84 mg/dL (ref 70–99)
Glucose-Capillary: 64 mg/dL — ABNORMAL LOW (ref 70–99)

## 2023-09-01 LAB — LACTIC ACID, PLASMA
Lactic Acid, Venous: 7.7 mmol/L (ref 0.5–1.9)
Lactic Acid, Venous: 8.2 mmol/L (ref 0.5–1.9)
Lactic Acid, Venous: 6.2 mmol/L (ref 0.5–1.9)
Lactic Acid, Venous: 2.9 mmol/L (ref 0.5–1.9)

## 2023-09-01 LAB — PHOSPHORUS: Phosphorus: 6 mg/dL — ABNORMAL HIGH (ref 2.5–4.6)

## 2023-09-01 LAB — MAGNESIUM: Magnesium: 1.8 mg/dL (ref 1.7–2.4)

## 2023-09-01 LAB — POTASSIUM: Potassium: 3.7 mmol/L (ref 3.5–5.1)

## 2023-09-01 LAB — AMYLASE: Amylase: 707 U/L — ABNORMAL HIGH (ref 28–100)

## 2023-09-01 LAB — TRIGLYCERIDES: Triglycerides: 72 mg/dL (ref <150)

## 2023-09-01 MED ORDER — SODIUM CHLORIDE 0.9 % IV SOLN
INTRAVENOUS | Status: AC
  Administered 2023-09-01: 50 mL/h via INTRAVENOUS

## 2023-09-01 MED ORDER — POTASSIUM CHLORIDE 10 MEQ/100ML IV SOLN
10.0000 meq | INTRAVENOUS | Status: AC
  Administered 2023-09-01 (×2): 10 meq via INTRAVENOUS
  Filled 2023-09-01 (×2): qty 100

## 2023-09-01 MED ORDER — THIAMINE HCL 100 MG/ML IJ SOLN
100.0000 mg | Freq: Every day | INTRAMUSCULAR | Status: DC
  Administered 2023-09-01 – 2023-09-04 (×4): 100 mg via INTRAVENOUS
  Filled 2023-09-01 (×4): qty 2

## 2023-09-01 MED ORDER — MAGNESIUM SULFATE 2 GM/50ML IV SOLN
2.0000 g | Freq: Once | INTRAVENOUS | Status: AC
  Administered 2023-09-01: 2 g via INTRAVENOUS
  Filled 2023-09-01: qty 50

## 2023-09-01 MED ORDER — DEXTROSE 50 % IV SOLN
12.5000 g | INTRAVENOUS | Status: AC
  Administered 2023-09-01: 12.5 g via INTRAVENOUS

## 2023-09-01 MED ORDER — LACTATED RINGERS IV BOLUS
1000.0000 mL | Freq: Once | INTRAVENOUS | Status: AC
  Administered 2023-09-01: 1000 mL via INTRAVENOUS

## 2023-09-01 MED ORDER — FOLIC ACID 5 MG/ML IJ SOLN
1.0000 mg | Freq: Every day | INTRAMUSCULAR | Status: DC
  Administered 2023-09-01 – 2023-09-04 (×4): 1 mg via INTRAVENOUS
  Filled 2023-09-01 (×4): qty 0.2

## 2023-09-01 MED ORDER — DEXTROSE 50 % IV SOLN
INTRAVENOUS | Status: AC
  Filled 2023-09-01: qty 50

## 2023-09-01 MED ORDER — INSULIN ASPART 100 UNIT/ML IJ SOLN
0.0000 [IU] | INTRAMUSCULAR | Status: DC
  Administered 2023-09-01: 3 [IU] via SUBCUTANEOUS
  Administered 2023-09-01: 1 [IU] via SUBCUTANEOUS
  Filled 2023-09-01: qty 1

## 2023-09-01 MED ORDER — POTASSIUM CHLORIDE 10 MEQ/50ML IV SOLN
10.0000 meq | INTRAVENOUS | Status: AC
  Administered 2023-09-01 (×4): 10 meq via INTRAVENOUS
  Filled 2023-09-01 (×4): qty 50

## 2023-09-01 MED ORDER — LEVETIRACETAM IN NACL 1000 MG/100ML IV SOLN
1000.0000 mg | INTRAVENOUS | Status: AC
  Administered 2023-09-01: 1000 mg via INTRAVENOUS
  Filled 2023-09-01: qty 100

## 2023-09-01 NOTE — Procedures (Signed)
Patient Name: Mark Mack  MRN: [id number]  Epilepsy Attending: Charlsie Quest  Referring Physician/Provider: Milon Dikes, MD  Date: 09/01/2023  Duration: 31.32 mins  Patient history: 67 year old male status post cardiac arrest.  EEG to alert for seizure.   Level of alertness: comatose  AEDs during EEG study: Propofol  Technical aspects: This EEG study was done with scalp electrodes positioned according to the 10-20 International system of electrode placement. Electrical activity was reviewed with band pass filter of 1-70Hz , sensitivity of 7 uV/mm, display speed of 59mm/sec with a 60Hz  notched filter applied as appropriate. EEG data were recorded continuously and digitally stored.  Video monitoring was available and reviewed as appropriate.  Description: EEG showed continuous generalized polymorphic mixed frequencies with predominantly 5 to 9 Hz theta-alpha activity admixed with intermittent generalized 2 to 3 Hz delta slowing.  Hyperventilation and photic stimulation were not performed.     ABNORMALITY - Continuous slow, generalized  IMPRESSION: This study is suggestive of moderate diffuse encephalopathy. No seizures or epileptiform discharges were seen throughout the recording.  [name] Annabelle Harman

## 2023-09-01 NOTE — Progress Notes (Signed)
Initial Nutrition Assessment  DOCUMENTATION CODES:   Not applicable  INTERVENTION:   Once appropriate for tube feeds, recommend:  Vital 1.2@50ml /hr- Initiate at 37ml/hr and increase by 5ml/hr q 8 hours until goal rate is reached.   ProSource TF 20- Give 60ml daily via tube, each supplement provides 80kcal and 20g of protein.   Free water flushes 30ml q4 hours to maintain tube patency   Regimen provides 1520kcal/day, 110g/day protein and 1147ml/day of free water  Pt at high refeed risk; recommend monitor potassium, magnesium and phosphorus labs daily until stable  Daily weights   NUTRITION DIAGNOSIS:   Inadequate oral intake related to inability to eat (pt sedated and ventilated) as evidenced by NPO status.  GOAL:   Provide needs based on ASPEN/SCCM guidelines  MONITOR:   Vent status, Labs, Weight trends, Skin, I & O's  REASON FOR ASSESSMENT:   Ventilator    ASSESSMENT:   67 y/o male with h/o CAD status post CABG, COPD, etoh and cocaine abuse, GERD, CKD and HTN who is admitted with STEMI, cardiac arrest, AMS, possible aspiration and suspected pancreatitis.  Pt s/p R heart cath 11/3  Pt sedated and ventilated. OGT in place with tip noted in proximal duodenum. Pt with elevated pancreatitic enzymes. Plan is for abdominal CT once pt is more stable. Pt with abdominal distension today. Will hold tube feeds today per MD. Pt is likely at refeed risk. Per chart, pt appears weight stable at baseline.    Medications reviewed and include: colace, folic acid, insulin, solu-cortef, protonix, thiamine, levophed, zosyn, propofol, Na bicarbonate  Labs reviewed: K 3.7 wnl, BUN 29(H), creat 2.28(H), P 6.0(H), Mg 1.8 wnl Amylase- 707(H), lipase 308(H), AST 419(H), ALT 161(H) WBC- 15.4(H) Cbgs- 102, 81, 144, 170, 210, 223, 267 x 24hrs  AIC 5.8(H)- 11/3  Patient is currently intubated on ventilator support MV: 10.9 L/min Temp (24hrs), Avg:95.6 F (35.3 C), Min:94 F (34.4 C),  Max:96.8 F (36 C)  Propofol: 7.98 ml/hr- provides 211kcal/day   MAP- >60mmHg   UOP-   NUTRITION - FOCUSED PHYSICAL EXAM:  Flowsheet Row Most Recent Value  Orbital Region No depletion  Upper Arm Region No depletion  Thoracic and Lumbar Region No depletion  Buccal Region No depletion  Temple Region No depletion  Clavicle Bone Region No depletion  Clavicle and Acromion Bone Region No depletion  Scapular Bone Region No depletion  Dorsal Hand No depletion  Patellar Region No depletion  Anterior Thigh Region No depletion  Posterior Calf Region No depletion  Edema (RD Assessment) None  Hair Reviewed  Eyes Reviewed  Mouth Reviewed  Skin Reviewed  Nails Reviewed   Diet Order:   Diet Order             Diet NPO time specified  Diet effective now                  EDUCATION NEEDS:   No education needs have been identified at this time  Skin:  Skin Assessment: Reviewed RN Assessment  Last BM:  pta  Height:   Ht Readings from Last 1 Encounters:  08/31/23 5\' 6"  (1.676 m)    Weight:   Wt Readings from Last 1 Encounters:  09/01/23 72.2 kg    Ideal Body Weight:  64.5 kg  BMI:  Body mass index is 25.69 kg/m.  Estimated Nutritional Needs:   Kcal:  1529kcal/day  Protein:  100-115g/day  Fluid:  1.7-1.9L/day  Betsey Holiday MS, RD, LDN Please refer to [facility]  for RD and/or RD on-call/weekend/after hours pager

## 2023-09-01 NOTE — Progress Notes (Signed)
Reviewed CT angio from prior showing High attenuation stranding adjacent to the partially visualized pancreas, concerning for hemorrhagic pancreatitis. Per Radiology recommendations Correlate with lipase and consider dedicated abdomen and pelvis CT. Lipase is elevated at 308. Patient has hx of heavy EtoH use per wife at the bedside. Unable to obtain a dedicated CT abdomen/pelvis overnight as patient is very unstable. Discussed with Dr. Allegra Lai this morning who recommends that patient may need IR consult for embolization when stable. GI to follow this am  [name], DNP, CCRN, FNP-C, AGACNP-BC Acute Care & Family Nurse Practitioner  [facility]  See Amion for personal pager PCCM on call pager [phone] until 7 am

## 2023-09-01 NOTE — Progress Notes (Addendum)
0700 Patient awakened and moved all extremities for Dr. Belia Heman. Did not track with his eyes or follow commands. Re-sedated. 0840 Sedation stopped for Dr Wilford Corner to assess patient. 1610 Patient awake and tracking with his eyes. Corneal reflexes sluggish. Withdraws to pain briskly with both arms and right leg. Left leg moves sluggishly. Resedated per verbal order. 0900-1800 Drips titrated as  down as ordered.  1400 Patient stable enough to start turning patient. Blood pressure and pulse ox. Up and down all day. As Peep on ventilator weaned blood pressure has increased. Labs reported to D.Delton See NP and Dr. Belia Heman.

## 2023-09-01 NOTE — Progress Notes (Signed)
Eeg done 

## 2023-09-01 NOTE — Progress Notes (Signed)
NAME:  Mark Mack, MRN:  [id number], DOB:  [birth year], LOS: 1 ADMISSION DATE:  08/31/2023 CONSULTATION DATE:  09/01/2023 CHIEF COMPLAINT:  Cardiac Arrest    History of Present Illness:  67 year old male presenting to the hospital with cardiac arrest.   Patient had a witnessed cardiac arrest at home and EMS was activated. CPR initiated at home prior to EMS, which continued, with ROSC achieved. Multiple rounds of CPR were completed secondary to continued loss of pulses. EKG was notable for inferior STEMI, and he is taken emergently to the cath lab for [facility] and will be admitted to the ICU afterwards.   In the cath lab, LHC performed showed occlusions in multiple vessels, though wire could not be passed and no revascularization was performed. Patient underwent a RHC with PCWP of 25 mmHg and mixed venous sat showing 33% saturation, with 69% saturation in the arterial circulation. This correlates to a cardiac output of 2.5 and cardiac index of 2.1.   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 He also has a history of HTN and smoking.  Pertinent  Medical History  Hypertension Coronary Artery Disease - S/P SABG (2019, 4 vessel) CHF Chronic Obstructive Pulmonary Disease ETOH  Significant Hospital Events: Including procedures, antibiotic start and stop dates in addition to other pertinent events   11/3: patient mechanically intubation requiring multiple vasopressors and sedation. CVC and arterial line placed given severe hemodynamic instability. Bronchoscopy completed at bedside with bloody secretions present. Epinephrine and tranexamic acid provided during the procedure. Patient attempted to have revascularization completed but was unable to do so. 11/4: Remains critically ill with increasing vasopressor requirements on mechanical ventilation. Remains on stress dose steroids. No longer requiring milrinone.    Micro Data:  11/3: MRSA PCR >> negative 11/4: Blood cultures  >>  Antimicrobials:  Zosyn   Interim History / Subjective:  Patient remains critically ill requiring mechanical ventilation with multi-vasopressor requirements. Due to hemodynamic instability, patient was unable to have ABD/Pelvic CT.  Vent Mode: PRVC FiO2 (%):  [60 %-100 %] 60 % Set Rate:  [16 bmp-33 bmp] 20 bmp Vt Set:  [450 mL-550 mL] 550 mL PEEP:  [14 cmH20] 14 cmH20   Objective   Blood pressure 118/72, pulse 70, temperature (!) 96.4 F (35.8 C), temperature source Axillary, resp. rate (!) 26, height 5\' 6"  (1.676 m), weight 72.2 kg, SpO2 100%.    Vent Mode: PRVC FiO2 (%):  [70 %-100 %] 70 % Set Rate:  [16 bmp-33 bmp] 28 bmp Vt Set:  [400 mL-450 mL] 450 mL PEEP:  [8 cmH20-14 cmH20] 14 cmH20   Intake/Output Summary (Last 24 hours) at 09/01/2023 0841 Last data filed at 09/01/2023 0815 Gross per 24 hour  Intake 4622.56 ml  Output 1615 ml  Net 3007.56 ml   Filed Weights   08/31/23 1213 09/01/23 0457  Weight: 66.5 kg 72.2 kg    REVIEW OF SYSTEMS Patient is unable to provide full ROS due to severe critical illness  PHYSICAL EXAMINATION:  GENERAL: critically ill appearing, no acute distress, intubated EYES: pupils equal, round, non-reactive to light.  No scleral icterus.  MOUTH: Moist mucosal membrane. INTUBATED NECK: Supple.  PULMONARY: - rhonchi, +wheezing, diminished breath sounds bilaterally CARDIOVASCULAR: S1 and S2. Regular rate and rhythm GASTROINTESTINAL: Soft, nontender, -distended. Positive bowel sounds.  MUSCULOSKELETAL: No swelling, clubbing, or edema.  NEUROLOGIC: obtunded, sedated, SAT showed extremity movement, tracking head movements, not following commands SKIN: normal, warm to touch, delayed capillary  refill, pulses present bilaterally 1+    Labs/imaging that I havepersonally reviewed  (right click and "Reselect all SmartList Selections" daily)   ASSESSMENT AND PLAN SYNOPSIS 67 yo male who presented following a cardiac arrest, found to have a  STEMI with multi-vessel occlusions, resulting in cardiogenic shock, metabolic  encephalopathy and AKI secondary to metabolic acidosis complicated by pancreatitis due to cocaine abuse. Patient was taken for emergent cath lab intervention but was unable to complete revascularization due to extensive occlusions. Hemodynamic instability requiring multiple vasopressors and mechanical ventilation.  NEURO #Metabolic Encephalopathy - RAAS goal of -1 - continue sedation as needed - wean sedation as tolerated - fentanyl and propofol, versed PRN - daily SAT as tolerated - Head CT: no evidence of subarachnoid hemorrhage - started Keppra 1000mg  - UDS + benzo, cocaine, cannibinoid - EEG pending - Neuro following   CARDIOVASCULAR #Cardiac Arrest #Inferior ST Elevation Myocardial Infarction #Coronary Artery Disease - S/P CABG #Cardiogenic Shock secondary to STEMI - continuous ICU monitoring - vasopressors to keep MAP goal > 65 - continue levophed, vasopressin and epi - increasing levophed given persistent hypotension and wean epi as tolerated - no longer requiring milrinone - trend cardiac enzymes - Troponin trends: 4043 ,6757 ,6665, 8646 - fluid resuscitation - continue zosyn for empiric coverage - follow blood cultures - trend ABG & lactic acid - lactic acid improving: 6.9, 7.7, 8.2, 6.2 - Transthoracic ECHO pending - Cardiology following, appreciate input   PULMONARY #Acute Hypoxic and Hypercapnic Respiratory Failure #Pulmonary Contusion secondary to CPR #Alveolar Hemorrhage - continue mechanical ventilator support - continue bronchodilators as needed - wean FiO2 and PEEP as tolerated - SAT/SBT when parameters met - continue lung protective measures - Plateau Pressures < 30 as tolerated - Trend ABGs, this am pH 7.2, CO2 54 - CXRs daily  - CTA ruled out pulmonary embolism  Vent Mode: PRVC FiO2 (%):  [70 %-100 %] 60 % Set Rate:  [16 bmp-33 bmp] 28 bmp Vt Set:  [400 mL-450 mL]  450 mL PEEP:  [8 cmH20-14 cmH20] 14 cmH20   GI #Pancreatitis #Transaminitis - GI prophylaxis, continue protonix - previous CTA showed high attenuation stranding adjacent to the pancreas - ABD/Pelvic CT once more hemodynamically stable to assess extent of pancreatitis - Lipase elevated at 308 - trend hepatic function - LFTs continue to trend upwards, AST 285, 567, ALT 152, 168 - Constipation protocol - NPO for now   RENAL/ELECTROLYTES #Acute Kidney Injury secondary to Metabolic Acidosis #Acute on Chronic Kidney Disease #Hypokalemia - strict I/O, continue foley catheter - fluid resuscitation as indicated, LR bolus - trend renal function - Creatinine 1.90, 2.28 - BUN 21, 29 - monitor ABG's - trend BMP - replace electrolytes as needed - Potassium 2.7, continue potassium chloride - Magnesium replaced this am - Calcium 10.1, 6.8, 6.7 - Phosphorus elevation improving; currently 6.0, down from 9.5 - avoid nephrotoxic agents and renal dose medications as needed   Intake/Output Summary (Last 24 hours) at 09/01/2023 0841 Last data filed at 09/01/2023 0815 Gross per 24 hour  Intake 4622.56 ml  Output 1615 ml  Net 3007.56 ml    ENDO - continue glycemic protocol - CBG - sliding scale insulin, currently using novolog - continue hydrocortisone 50mg  Q6hr consider stopping   HEME #Acute Anemia #Thrombocytopenia - DVT prophylaxis, SCDs only for now - Heparin, on hold for now given alveolar hemorrhage - trend CBC - monitor Hemoglobin trends, currently 12.5 - consider transfusion if Hemoglobin < 7 - platelets 143 this  am, monitor trends   ID #Aspiration Pneumonia - trend WBC and monitor fever curve - WBC trends: 8.9, 14.8, 15.4 - continue zosyn for aspiration pneumonia coverage-may consider stopping - PCT <0.10 this am    Best practice (right click and "Reselect all SmartList Selections" daily)  Diet: NPO Pain/Anxiety/Delirium protocol (if indicated): Yes (RASS  goal -1) VAP protocol (if indicated): Yes DVT prophylaxis: Contraindicated GI prophylaxis: PPI Glucose control:  Insulin gtt Central venous access:  Yes, and it is still needed Arterial line:  Yes, and it is still needed Foley:  Yes, and it is still needed Mobility:  bed rest  Code Status:  FULL Disposition:ICU  Labs   CBC: Recent Labs  Lab 08/31/23 0952 08/31/23 1200 08/31/23 1719 09/01/23 0307  WBC 8.9  --  14.8* 15.4*  NEUTROABS 2.7  --  11.8*  --   HGB 13.0 12.6*  12.2* 12.0* 12.5*  HCT 43.6 37.0*  36.0* 37.4* 39.8  MCV 93.6  --  88.8 88.4  PLT 157  --  168 143*    Basic Metabolic Panel: Recent Labs  Lab 08/31/23 0952 08/31/23 1200 08/31/23 1719 09/01/23 0307  NA 143 132*  129* 137 139  K 4.2 4.3  4.3 3.0* 2.7*  CL 104  --  109 104  CO2 18*  --  16* 21*  GLUCOSE 349*  --  227* 312*  BUN 15  --  21 29*  CREATININE 1.24  --  1.90* 2.28*  CALCIUM 10.1  --  6.8* 6.7*  MG 2.6*  --   --  1.8  PHOS 9.5*  --   --  6.0*   GFR: Estimated Creatinine Clearance: 28.8 mL/min (A) (by C-G formula based on SCr of 2.28 mg/dL (H)). Recent Labs  Lab 08/31/23 0952 08/31/23 1719 08/31/23 2211 09/01/23 0121 09/01/23 0307 09/01/23 0441  PROCALCITON <0.10  --   --   --   --   --   WBC 8.9 14.8*  --   --  15.4*  --   LATICACIDVEN  --  6.9* 6.9* 7.7*  --  8.2*    Liver Function Tests: Recent Labs  Lab 08/31/23 0952 08/31/23 1719  AST 285* 567*  ALT 152* 168*  ALKPHOS 134* 122  BILITOT 0.7 0.7  PROT 5.4* 4.5*  ALBUMIN 2.5* 2.1*   Recent Labs  Lab 08/31/23 0952 09/01/23 0307  LIPASE 35 308*   No results for input(s): "AMMONIA" in the last 168 hours.  ABG    Component Value Date/Time   PHART 7.2 (L) 09/01/2023 0450   PCO2ART 54 (H) 09/01/2023 0450   PO2ART 85 09/01/2023 0450   HCO3 21.1 09/01/2023 0450   TCO2 18 (L) 08/31/2023 1200   TCO2 16 (L) 08/31/2023 1200   ACIDBASEDEF 7.4 (H) 09/01/2023 0450   O2SAT 98 09/01/2023 0450     Coagulation  Profile: Recent Labs  Lab 08/31/23 0952  INR 1.5*    Cardiac Enzymes: No results for input(s): "CKTOTAL", "CKMB", "CKMBINDEX", "TROPONINI" in the last 168 hours.  HbA1C: Hgb A1c MFr Bld  Date/Time Value Ref Range Status  08/31/2023 09:52 AM 5.8 (H) 4.8 - 5.6 % Final    Comment:    (NOTE) Pre diabetes:          5.7%-6.4%  Diabetes:              >6.4%  Glycemic control for   <7.0% adults with diabetes     CBG: Recent Labs  Lab 08/31/23 1227  08/31/23 2325 09/01/23 0310 09/01/23 0726 09/01/23 0834  GLUCAP 274* 296* 267* 223* 210*      Home Medications  Prior to Admission medications   Medication Sig Start Date End Date Taking? Authorizing Provider  acetaminophen (TYLENOL) 500 MG tablet Take 1,000 mg by mouth every 6 (six) hours as needed.     [provider]  aspirin EC 81 MG tablet Take 81 mg by mouth daily.     [provider]  docusate sodium (COLACE) 100 MG capsule Take 1 capsule (100 mg total) by mouth daily. Patient not taking: Reported on 05/28/2018 04/03/18   Ramonita Lab, MD  esomeprazole (NEXIUM) 20 MG packet Take 20 mg by mouth daily before breakfast.  04/01/18 05/01/18  [provider]  ezetimibe (ZETIA) 10 MG tablet Take 10 mg by mouth daily. 05/22/18   [provider]  ferrous sulfate 325 (65 FE) MG EC tablet Take 1 tablet (325 mg total) by mouth daily with breakfast. Patient not taking: Reported on 05/28/2018 04/03/18 04/03/19  Ramonita Lab, MD  furosemide (LASIX) 20 MG tablet Take 20 mg by mouth daily. 03/29/18   [provider]  metoprolol succinate (TOPROL XL) 50 MG 24 hr tablet Take 50 mg by mouth daily.  02/13/18 02/13/19  [provider]  ranitidine (ZANTAC) 150 MG tablet  05/26/18   [provider]  rosuvastatin (CRESTOR) 20 MG tablet Take 1 tablet (20 mg total) by mouth at bedtime. Patient not taking: Reported on 05/28/2018 04/03/18 04/03/19  Ramonita Lab, MD  senna-docusate (SENOKOT-S) 8.6-50 MG tablet Take  1 tablet by mouth at bedtime as needed for mild constipation. Patient not taking: Reported on 05/28/2018 04/03/18   Ramonita Lab, MD     DVT/GI PRX  assessed I Assessed the need for Labs I Assessed the need for Foley I Assessed the need for Central Venous Line Family Discussion when available I Assessed the need for Mobilization I made an Assessment of medications to be adjusted accordingly Safety Risk assessment completed  CASE DISCUSSED IN MULTIDISCIPLINARY ROUNDS WITH ICU TEAM   Critical Care Time devoted to patient care services described in this note is 65 minutes.   Critical care was necessary to treat /prevent imminent and life-threatening deterioration.   PATIENT WITH VERY POOR PROGNOSIS I ANTICIPATE PROLONGED ICU LOS  Patient is critically ill. Patient with Multiorgan failure and at high risk for cardiac arrest and death.    Lucie Leather, M.D.  [facility]  Medical Director [facility] Medical Director [facility] Cardio-Pulmonary Department

## 2023-09-01 NOTE — Progress Notes (Signed)
PHARMACY CONSULT NOTE  Pharmacy Consult for Electrolyte Monitoring and Replacement   Recent Labs: Potassium (mmol/L)  Date Value  09/01/2023 2.7 (LL)   Magnesium (mg/dL)  Date Value  86/57/8469 1.8   Calcium (mg/dL)  Date Value  62/95/2841 6.7 (L)   Albumin (g/dL)  Date Value  32/44/0102 2.1 (L)   Phosphorus (mg/dL)  Date Value  72/53/6644 6.0 (H)   Sodium (mmol/L)  Date Value  09/01/2023 139    Assessment: Pt is 67 yo male admitted following cardiac arrest at home.  Pharmacy consulted to assist with electrolyte monitoring and replacement as indicated.  Goal of Therapy:  Electrolytes within normal limits  Plan:  Ordered KCl 10 mEq x 4 Recheck K+ 1 hours after last dose. Pharmacy will continue to follow along  Otelia Sergeant, PharmD, [facility] 09/01/2023 4:04 AM

## 2023-09-01 NOTE — IPAL (Signed)
  Interdisciplinary Goals of Care Family Meeting   Date carried out: 09/01/2023  Location of the meeting: Conference room  Member's involved: Physician, Nurse Practitioner, and Family Member or next of kin    GOALS OF CARE DISCUSSION  The Clinical status was relayed to family in detail- Daughter, Son, Wife  Updated and notified of patients medical condition- Explained to family course of therapy and the modalities   Patient with Progressive multiorgan failure with a very high probablity of a very minimal chance of meaningful recovery despite all aggressive and optimal medical therapy.  PATIENT REMAINS DNR status  Family understands the situation.  Cocaine leading to acute MI and cardiac arrest with multiorgan failure Continue medical management  Family are satisfied with Plan of action and management. All questions answered  Additional CC time 32 mins   Collen Vincent Santiago Glad, M.D.  [facility]  Medical Director [facility] Medical Director [facility] Cardio-Pulmonary Department

## 2023-09-01 NOTE — Consult Note (Signed)
Neurology Consultation  Reason for Consult: post cardiac arrest neuroprognosis Referring Physician: Dr Aundria Rud  CC: Postcardiac arrest  History is obtained from: Chart, patient  HPI: Mark Mack is a 67 y.o. male past medical history of CAD status post CABG, stage angina, hypertension presented to the emergency department after witnessed cardiac arrest at home.  It was reported that the patient had his arms flexed and was taking deep breaths with big exhales with his eyes looking up straight and fixated when this happened.  He had lost pulses and the EMS was called who recommended that CPR be initiated which was started at home.  Then EMS arrived and took over.  He was noted to have a inferior STEMI on his EKG and was taken emergently to Cath Lab for left heart catheter where multiple occlusions were noted without successful revascularization.  Right heart cath PCWP 25 and mixed venous sat 33% saturation at 69% saturation in the arterial circulation but a cardiac output of 2.5 and cardiac index of 2.1.  Admitted to the ICU for further workup.  Initial preliminary examination by the ICU team was extremely poor with complete unresponsiveness and possible brainstem reflexes.  They are discussed with the family that this may be a bad prognostic sign but upon lowering sedation patient had started to open eyes spontaneously and track providers without following much commands. Neurological consultation was approved for neuroprognosis.  ICU course has been complicated with pulmonary hemorrhage requiring emergent bronchoscopy as well as possible hemorrhagic pancreatitis.  LKW: Prior to presentation to the ED, probably 9 AM or little before that on 08/31/2023. IV thrombolysis given?: no, came in as a STEMI Premorbid modified Rankin scale (mRS): 0   ROS: Unable to be ascertained given his mentation  Past Medical History:  Diagnosis Date   Hypertension    No family history on file.  Social History:    reports that he quit smoking about 5 years ago. He has never used smokeless tobacco. No history on file for alcohol use and drug use.  Medications  Current Facility-Administered Medications:    0.9 %  sodium chloride infusion, , Intravenous, Continuous, Kasa, Kurian, MD, Last Rate: 50 mL/hr at 09/01/23 0807, 50 mL/hr at 09/01/23 0807   Chlorhexidine Gluconate Cloth 2 % PADS 6 each, 6 each, Topical, Q0600, [name], MD, 6 each at 09/01/23 0546   docusate (COLACE) 50 MG/5ML liquid 100 mg, 100 mg, Per Tube, BID, Dgayli, Khabib, MD   docusate sodium (COLACE) capsule 100 mg, 100 mg, Oral, BID PRN, [name], MD   EPINEPHrine (ADRENALIN) 5 mg in NS 250 mL (0.02 mg/mL) premix infusion, 0.5-20 mcg/min, Intravenous, Titrated, Dgayli, Khabib, MD, Last Rate: 24 mL/hr at 09/01/23 0928, 8 mcg/min at 09/01/23 0928   fentaNYL (SUBLIMAZE) injection 50-200 mcg, 50-200 mcg, Intravenous, Q30 min PRN, Dgayli, Khabib, MD   fentaNYL in NS (46mcg/ml) infusion-PREMIX, 0-400 mcg/hr, Intravenous, Continuous, Dgayli, Khabib, MD, Stopped at 09/01/23 0841   hydrocortisone sodium succinate (SOLU-CORTEF) 100 MG injection 50 mg, 50 mg, Intravenous, Q6H, Dgayli, Khabib, MD, 50 mg at 09/01/23 0201   insulin aspart (novoLOG) injection 0-9 Units, 0-9 Units, Subcutaneous, Q4H, Ezequiel Essex, NP, 3 Units at 09/01/23 0839   midazolam (VERSED) injection 1-2 mg, 1-2 mg, Intravenous, Q1H PRN, Aundria Rud, Khabib, MD, 2 mg at 08/31/23 1525   norepinephrine (LEVOPHED) 16 mg in (0.064 mg/mL) premix infusion, 0-200 mcg/min, Intravenous, Titrated, Kasa, Kurian, MD, Last Rate: 46.9 mL/hr at 09/01/23 0927, 50 mcg/min at 09/01/23  0927   ondansetron (ZOFRAN) injection 4 mg, 4 mg, Intravenous, Q6H PRN, Callwood, Dwayne D, MD   Oral care mouth rinse, 15 mL, Mouth Rinse, Q2H, Dgayli, Khabib, MD, 15 mL at 09/01/23 0546   Oral care mouth rinse, 15 mL, Mouth Rinse, PRN, [name], MD   pantoprazole (PROTONIX) injection 40  mg, 40 mg, Intravenous, QHS, Dgayli, Khabib, MD, 40 mg at 08/31/23 2107   piperacillin-tazobactam (ZOSYN) IVPB 3.375 g, 3.375 g, Intravenous, Q8H, Foye Deer, RPH, Last Rate: 12.5 mL/hr at 09/01/23 7829, Infusion Verify at 09/01/23 0642   polyethylene glycol (MIRALAX / GLYCOLAX) packet 17 g, 17 g, Oral, Daily PRN, Aundria Rud, Khabib, MD   potassium chloride 10 mEq in 100 mL IVPB, 10 mEq, Intravenous, Q1 Hr x 2, Ouma, Hubbard Hartshorn, NP, Last Rate: 100 mL/hr at 09/01/23 0846, 10 mEq at 09/01/23 0846   propofol (DIPRIVAN) 1000 MG/100ML infusion, 20 mcg/kg/min, Intravenous, Continuous, Dgayli, Khabib, MD, Stopped at 09/01/23 0840   sodium bicarbonate 150 mEq in sterile water 1,150 mL infusion, , Intravenous, Continuous, Ouma, Hubbard Hartshorn, NP, Last Rate: 100 mL/hr at 09/01/23 0642, Infusion Verify at 09/01/23 5621   vasopressin (PITRESSIN) 20 Units in 100 mL (0.2 unit/mL) infusion-*FOR SHOCK*, 0-0.03 Units/min, Intravenous, Continuous, Jimmye Norman, NP, Last Rate: 9 mL/hr at 09/01/23 0642, 0.03 Units/min at 09/01/23 0642   Exam: Current vital signs: BP (!) 161/80   Pulse 64   Temp (!) 96.4 F (35.8 C) (Axillary)   Resp (!) 27   Ht 5\' 6"  (1.676 m)   Wt 72.2 kg   SpO2 99%   BMI 25.69 kg/m  Vital signs in last 24 hours: Temp:  [94 F (34.4 C)-96.4 F (35.8 C)] 96.4 F (35.8 C) (11/04 0330) Pulse Rate:  [30-104] 64 (11/04 0800) Resp:  [16-46] 27 (11/04 0845) BP: (57-161)/(44-139) 161/80 (11/04 0800) SpO2:  [51 %-100 %] 99 % (11/04 0800) Arterial Line BP: (55-276)/(33-272) 108/56 (11/04 0845) FiO2 (%):  [70 %-100 %] 70 % (11/04 0720) Weight:  [66.5 kg-72.2 kg] 72.2 kg (11/04 0457) General: Intubated, sedated with propofol and fentanyl both of which were held for the exam HEENT: Normocephalic atraumatic Lungs: Clear Cardiovascular: Regular rhythm Neurological exam Sedation held for exam Intubated About 10 to 15 minutes after holding propofol, spontaneously opens  eyes.  Also opens eyes to voice.  Attempts to track the examiner on both sides.  Does not follow commands.  Nonverbal. Cranial nerves: Pupils are equal round react light, extraocular movements appear to be unhindered, does not blink to threat consistently from each side, face appears grossly symmetric, corneal reflexes present, cough and gag present.  Breathing over the ventilator. Motor examination: Spontaneously moves all 4 extremities with the left lower extremity being weaker than all the other 3.  On noxious stimulation also minimal withdrawal in the left lower extremity but the withdrawal another 3 is much stronger. Sensation: As above Coordination cannot be assessed given his mentation NIHSS 1a Level of Conscious.: 1 1b LOC Questions: 2 1c LOC Commands: 2 2 Best Gaze: 0 3 Visual: 0 4 Facial Palsy: 0 5a Motor Arm - left: 1 5b Motor Arm - Right: 1 6a Motor Leg - Left: 3 6b Motor Leg - Right: 2 7 Limb Ataxia: 0 8 Sensory: 1 9 Best Language: 3 10 Dysarthria: 2 11 Extinct. and Inatten.: 0 TOTAL: 18   Labs I have reviewed labs in epic and the results pertinent to this consultation are:  CBC    Component Value Date/Time  WBC 15.4 (H) 09/01/2023 0307   RBC 4.50 09/01/2023 0307   HGB 12.5 (L) 09/01/2023 0307   HCT 39.8 09/01/2023 0307   PLT 143 (L) 09/01/2023 0307   MCV 88.4 09/01/2023 0307   MCH 27.8 09/01/2023 0307   MCHC 31.4 09/01/2023 0307   RDW 15.2 09/01/2023 0307   LYMPHSABS 1.3 08/31/2023 1719   MONOABS 1.4 (H) 08/31/2023 1719   EOSABS 0.0 08/31/2023 1719   BASOSABS 0.1 08/31/2023 1719    CMP     Component Value Date/Time   NA 139 09/01/2023 0307   K 3.7 09/01/2023 0821   CL 104 09/01/2023 0307   CO2 21 (L) 09/01/2023 0307   GLUCOSE 312 (H) 09/01/2023 0307   BUN 29 (H) 09/01/2023 0307   CREATININE 2.28 (H) 09/01/2023 0307   CALCIUM 6.7 (L) 09/01/2023 0307   PROT 4.5 (L) 08/31/2023 1719   ALBUMIN 2.1 (L) 08/31/2023 1719   AST 567 (H) 08/31/2023 1719    ALT 168 (H) 08/31/2023 1719   ALKPHOS 122 08/31/2023 1719   BILITOT 0.7 08/31/2023 1719   GFRNONAA 31 (L) 09/01/2023 0307   GFRAA >60 06/03/2020 0807    Lipid Panel     Component Value Date/Time   CHOL 153 08/31/2023 0952   TRIG 72 09/01/2023 0307   HDL 62 08/31/2023 0952   CHOLHDL 2.5 08/31/2023 0952   VLDL 34 08/31/2023 0952   LDLCALC 57 08/31/2023 0952    Lab Results  Component Value Date   HGBA1C 5.8 (H) 08/31/2023      Imaging I have reviewed the images obtained:  CT-head-done after the CT angio chest was done with contrast in the CT.  No acute changes.  Assessment: 67 year old man past history of CAD status post CABG stable angina and hypertension presented after witnessed cardiac arrest at home with continuing CPR while in the ED.  Initial reports of possible decorticate posturing but very poor exam on initial exam by PCCM.  Upon lowering sedation, has started having some purposeful movements.  Has completely intact brainstem reflexes. Has some left-sided hemiparesis on my exam but again this might not be the best exam given the sedation and closeness to the cardiac arrest.  He could have also had stroke during this time.  He probably has sustained some amount of hypoxic encephalopathy/injury and may be a stroke as well, but at this point, I cannot be certain if he has sustained a severe anoxic brain injury.  Given his exhibiting, repetitive chewing movements -  concern for seizures.  His ICU course has been complicated by pulmonary hemorrhage and hemorrhagic pancreatitis which may impact his overall prognosis but from a neurological exam standpoint he seems to be doing better than what I would have expected after long CPR.  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 Supportive care per primary team as you are Sedation holiday for repeat neuroexams Maintain seizure precautions  Plan was discussed with his family and his attending physician Dr. Belia Heman -- Milon Dikes, MD Neurologist [facility] Pager: [phone]  CRITICAL CARE ATTESTATION Performed by: Milon Dikes, MD Total critical care time: 45 minutes Critical care time was exclusive of separately billable procedures and treating other patients and/or supervising APPs/Residents/Students Critical  care was necessary to treat or prevent imminent or life-threatening deterioration. This patient is critically ill and at significant risk for neurological worsening and/or death and care requires constant monitoring. Critical care was time spent personally by me on the following activities: development of treatment plan with patient and/or surrogate as well as nursing, discussions with consultants, evaluation of patient's response to treatment, examination of patient, obtaining history from patient or surrogate, ordering and performing treatments and interventions, ordering and review of laboratory studies, ordering and review of radiographic studies, pulse oximetry, re-evaluation of patient's condition, participation in multidisciplinary rounds and medical decision making of high complexity in the care of this patient.

## 2023-09-01 NOTE — Progress Notes (Signed)
[facility] CLINIC CARDIOLOGY PROGRESS NOTE   Patient ID: Mark Mack MRN: [id number] DOB/AGE: 04/12/1956 67 y.o.  Admit date: 08/31/2023 Primary Physician Hillery Hunter, Graylin Shiver, MD  Primary Cardiologist Dr. Hessie Dibble [facility] - last seen 2019)  HPI: Mark Mack is a 67 y.o. male with a past medical history of CAD s/p CABG x 4 2019, hypertension, hyperlipidemia who was brought to the ED on 08/31/2023 for witnessed cardiac arrest. When ROSC was obtained there was concern for inferolateral STEMI and patient was taken emergently to the cath lab.   Interval History:  -Patient remains intubated, sedation off and patient reportedly had some movement yesterday. Not yet following commands.  -Milrinone discontinued overnight. On levo, epi, vasopressin gtts. Remains hypotensive. -HR remains controlled in NSR.   Review of systems complete and found to be negative unless listed above    Vitals:   09/01/23 1000 09/01/23 1015 09/01/23 1030 09/01/23 1045  BP: (!) 79/52     Pulse: 65 64 60 62  Resp: 18 20 20 20   Temp:      TempSrc:      SpO2: 95% 93% 96% 97%  Weight:      Height:         Intake/Output Summary (Last 24 hours) at 09/01/2023 1144 Last data filed at 09/01/2023 0815 Gross per 24 hour  Intake 4622.56 ml  Output 1615 ml  Net 3007.56 ml     PHYSICAL EXAM General: Critically ill appearing, intubated.  HEENT: Normocephalic and atraumatic. Neck: No JVD.  Lungs: Diminished bilaterally.  Heart: HRRR. Normal S1 and S2 without gallops or murmurs. Radial & DP pulses 2+ bilaterally. Abdomen: Non-distended appearing.  Msk: Normal strength and tone for age. Extremities: No clubbing, cyanosis or edema.     LABS: Basic Metabolic Panel: Recent Labs    08/31/23 0952 08/31/23 1200 08/31/23 1719 09/01/23 0307 09/01/23 0821  NA 143   < > 137 139  --   K 4.2   < > 3.0* 2.7* 3.7  CL 104  --  109 104  --   CO2 18*  --  16* 21*  --   GLUCOSE 349*  --  227* 312*  --   BUN 15  --  21 29*  --    CREATININE 1.24  --  1.90* 2.28*  --   CALCIUM 10.1  --  6.8* 6.7*  --   MG 2.6*  --   --  1.8  --   PHOS 9.5*  --   --  6.0*  --    < > = values in this interval not displayed.   Liver Function Tests: Recent Labs    08/31/23 1719 09/01/23 0821  AST 567* 419*  ALT 168* 161*  ALKPHOS 122 93  BILITOT 0.7 0.8  PROT 4.5* 4.9*  ALBUMIN 2.1* 2.5*   Recent Labs    08/31/23 0952 09/01/23 0307 09/01/23 0821  LIPASE 35 308*  --   AMYLASE  --   --  707*   CBC: Recent Labs    08/31/23 0952 08/31/23 1200 08/31/23 1719 09/01/23 0307  WBC 8.9  --  14.8* 15.4*  NEUTROABS 2.7  --  11.8*  --   HGB 13.0   < > 12.0* 12.5*  HCT 43.6   < > 37.4* 39.8  MCV 93.6  --  88.8 88.4  PLT 157  --  168 143*   < > = values in this interval not displayed.   Cardiac Enzymes: Recent Labs  08/31/23 2327 09/01/23 0441 09/01/23 0821  TROPONINIHS 6,757* 6,665* 8,646*   BNP: No results for input(s): "BNP" in the last 72 hours. D-Dimer: No results for input(s): "DDIMER" in the last 72 hours. Hemoglobin A1C: Recent Labs    08/31/23 0952  HGBA1C 5.8*   Fasting Lipid Panel: Recent Labs    08/31/23 0952 09/01/23 0307  CHOL 153  --   HDL 62  --   LDLCALC 57  --   TRIG 171* 72  CHOLHDL 2.5  --    Thyroid Function Tests: No results for input(s): "TSH", "T4TOTAL", "T3FREE", "THYROIDAB" in the last 72 hours.  Invalid input(s): "FREET3" Anemia Panel: No results for input(s): "VITAMINB12", "FOLATE", "FERRITIN", "TIBC", "IRON", "RETICCTPCT" in the last 72 hours.  DG Abd 1 View  Result Date: 09/01/2023 CLINICAL DATA:  OG tube placement EXAM: ABDOMEN - 1 VIEW COMPARISON:  None Available. FINDINGS: Enteric tube terminates in the proximal duodenum. IMPRESSION: Enteric tube terminates in the proximal duodenum. Electronically Signed   By: Charline Bills M.D.   On: 09/01/2023 01:26   DG Chest Port 1 View  Result Date: 08/31/2023 CLINICAL DATA:  Central line placement EXAM: PORTABLE CHEST  1 VIEW COMPARISON:  Same day CT FINDINGS: Interval placement of right internal jugular approach central venous catheter with distal tip terminating near the superior cavoatrial junction. Endotracheal tube has been retracted and now terminates 4.6 cm above the carina. Stable heart size. Aortic atherosclerosis. Diffuse bilateral airspace consolidations. No pleural effusion or pneumothorax. IMPRESSION: 1. Interval placement of right internal jugular approach central venous catheter with distal tip terminating near the superior cavoatrial junction. No pneumothorax. 2. Endotracheal tube has been retracted and now terminates 4.6 cm above the carina. 3. Persistent diffuse bilateral airspace consolidations. Electronically Signed   By: Duanne Guess D.O.   On: 08/31/2023 17:05   CT HEAD WO CONTRAST ( )  Addendum Date: 08/31/2023   ADDENDUM REPORT: 08/31/2023 15:33 ADDENDUM: The intravenous contrast present on the CT head is actually from prior cardiac catheterization, and not from the concurrently obtained CTA chest as initially reported. Electronically Signed   By: Lesia Hausen M.D.   On: 08/31/2023 15:33   Result Date: 08/31/2023 CLINICAL DATA:  Altered mental status EXAM: CT HEAD WITHOUT CONTRAST TECHNIQUE: Contiguous axial images were obtained from the base of the skull through the vertex without intravenous contrast. RADIATION DOSE REDUCTION: This exam was performed according to the departmental dose-optimization program which includes automated exposure control, adjustment of the mA and/or kV according to patient size and/or use of iterative reconstruction technique. COMPARISON:  None Available. FINDINGS: The study is postcontrast due to prior contrast administration from a concurrently obtained CTA chest. This reduces sensitivity for subarachnoid hemorrhage. Brain: There is no definite acute intracranial hemorrhage or extra-axial fluid collection. There is no acute territorial infarct. Parenchymal volume is  normal. The ventricles are normal in size. Gray-white differentiation is preserved. Hypodensity in the supratentorial white matter likely reflects sequela of underlying chronic small-vessel ischemic change. The pituitary and suprasellar region are normal there is no mass lesion or abnormal enhancement. There is no mass effect or midline shift. Vascular: The major vessels enhance normally. Skull: Normal. Negative for fracture or focal lesion. Sinuses/Orbits: There is complete opacification of the right sphenoid sinus. The globes and orbits are unremarkable. Other: The mastoid air cells and middle ear cavities are clear. IMPRESSION: The study is obtained postcontrast due to contrast administration from the concurrently obtained CTA chest. This reduces sensitivity for detection of subarachnoid  hemorrhage. Within this confine, no evidence of acute intracranial pathology. Electronically Signed: By: Lesia Hausen M.D. On: 08/31/2023 13:26   CT Angio Chest Pulmonary Embolism (PE) W or WO Contrast  Result Date: 08/31/2023 CLINICAL DATA:  Witnessed cardiac arrest EXAM: CT ANGIOGRAPHY CHEST WITH CONTRAST TECHNIQUE: Multidetector CT imaging of the chest was performed using the standard protocol during bolus administration of intravenous contrast. Multiplanar CT image reconstructions and MIPs were obtained to evaluate the vascular anatomy. RADIATION DOSE REDUCTION: This exam was performed according to the departmental dose-optimization program which includes automated exposure control, adjustment of the mA and/or kV according to patient size and/or use of iterative reconstruction technique. CONTRAST:  75mL OMNIPAQUE IOHEXOL 350 MG/ML SOLN COMPARISON:  None Available. FINDINGS: Cardiovascular: No evidence of pulmonary embolus. Normal heart size. No pericardial effusion. Normal caliber thoracic aorta with moderate atherosclerotic disease. 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 Mediastinum/Nodes: Esophagus and  thyroid are unremarkable. No enlarged lymph nodes seen in the chest. Lungs/Pleura: ETT is in the distal trachea. Consolidations of the bilateral lower lobes. Central and upper lung predominant patchy ground-glass opacities. No pneumothorax. Upper Abdomen: High attenuation stranding adjacent to the partially visualized pancreas, concerning for hemorrhagic pancreatitis. Musculoskeletal: Prior median sternotomy with intact sternal wires. Nondisplaced fractures of the anterior right fourth through sixth ribs. Review of the MIP images confirms the above findings. IMPRESSION: 1. No evidence of pulmonary embolus. 2. Consolidations of the bilateral lower lobes and central and upper lobe predominant ground-glass opacities, likely due to a combination of aspiration and pulmonary edema. 3. High attenuation stranding adjacent to the partially visualized pancreas, concerning for hemorrhagic pancreatitis. Correlate with lipase and consider dedicated abdomen and pelvis CT. 4. Nondisplaced fractures of the anterior right fourth through sixth ribs. 5. 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 6. Aortic Atherosclerosis (ICD10-I70.0). Electronically Signed   By: Allegra Lai M.D.   On: 08/31/2023 12:39     ECHO pending  TELEMETRY reviewed by me 09/01/23: NSR rate 70s  EKG reviewed by me 09/01/23: no EKG for review this admission  DATA reviewed by me 09/01/23: last 24h vitals tele labs imaging I/O, PCCM progress note  Principal Problem:   Cardiac arrest [facility]) Active Problems:   STEMI (ST elevation myocardial infarction) (HCC)    ASSESSMENT AND PLAN: Tayt Moyers is a 67 y.o. male with a past medical history of CAD s/p CABG x 4 2019, hypertension, hyperlipidemia who was brought to the ED on 08/31/2023 for witnessed cardiac arrest. When ROSC was obtained there was concern for inferolateral STEMI and patient was taken emergently to the cath lab.   # Cardiac arrest # STEMI # Coronary artery disease s/p  CABG x4 2019 Patient with witnessed cardiac arrest, ROSC obtained and EKG concerning for inferolateral STEMI. Patient taken urgently to cath lab 11/3, unsuccessful attempts to cross ostial LCx and prox-mid RCA lesions. PCWP of 25 mmHg. Requiring multiple vasopressors, was on milrinone but this was discontinued overnight.  -Echo pending -Continue BP support.  -Would recommend plavix when able to take PO.  -Heparin held given concern for alveolar hemorrhage.  # Acute Hypoxic and Hypercapnic Respiratory Failure # Pulmonary contusion # Bland alveolar hemorrhage Remains intubated on vent support. Bronch done 11/3 demonstrated alveolar hemorrhage likely 2/2 pulmonary contusion.  -Management per PCCM.   #AKI on CKD #Metabolic acidosis Cr 1.24 on admission up to 2.28 this AM.  -Continue to monitor renal function closely.    This patient's case was discussed and created with Dr. Melton Alar  and she is in agreement.  Signed:  [name], PA-C  09/01/2023, 11:44 AM [facility] Cardiology

## 2023-09-01 NOTE — Progress Notes (Signed)
PHARMACY CONSULT NOTE - FOLLOW UP  Pharmacy Consult for Electrolyte Monitoring and Replacement   Recent Labs: Potassium (mmol/L)  Date Value  09/01/2023 3.7   Magnesium (mg/dL)  Date Value  95/62/1308 1.8   Calcium (mg/dL)  Date Value  65/78/4696 6.7 (L)   Albumin (g/dL)  Date Value  29/52/8413 2.5 (L)   Phosphorus (mg/dL)  Date Value  24/40/1027 6.0 (H)   Sodium (mmol/L)  Date Value  09/01/2023 139   Corrected Ca: 7.9 mg/dL  Assessment: 67 y.o. male with a past medical history of CAD s/p CABG x 4 2019, hypertension, hyperlipidemia who was brought to the ED on 08/31/2023 for witnessed cardiac arrest. Pharmacy is asked to follow and replace electrolytes while in ccu.  Goal of Therapy:  Electrolytes WNL  Plan:  ---2 grams IV magnesium sulfate x 1 ---10 mEq IV KCl x 2 ---recheck electrolytes in am  Lowella Bandy ,PharmD Clinical Pharmacist 09/01/2023 1:23 PM

## 2023-09-01 NOTE — Progress Notes (Signed)
*  PRELIMINARY RESULTS* Echocardiogram 2D Echocardiogram has been performed.  Carolyne Fiscal 09/01/2023, 10:44 AM

## 2023-09-02 ENCOUNTER — Inpatient Hospital Stay: Payer: Medicare HMO

## 2023-09-02 DIAGNOSIS — G9340 Encephalopathy, unspecified: Secondary | ICD-10-CM

## 2023-09-02 DIAGNOSIS — I469 Cardiac arrest, cause unspecified: Secondary | ICD-10-CM | POA: Diagnosis not present

## 2023-09-02 DIAGNOSIS — Z7189 Other specified counseling: Secondary | ICD-10-CM

## 2023-09-02 LAB — CBC
HCT: 30.6 % — ABNORMAL LOW (ref 39.0–52.0)
Hemoglobin: 10.5 g/dL — ABNORMAL LOW (ref 13.0–17.0)
MCH: 28.5 pg (ref 26.0–34.0)
MCHC: 34.3 g/dL (ref 30.0–36.0)
MCV: 83.2 fL (ref 80.0–100.0)
Platelets: 95 10*3/uL — ABNORMAL LOW (ref 150–400)
RBC: 3.68 MIL/uL — ABNORMAL LOW (ref 4.22–5.81)
RDW: 14.9 % (ref 11.5–15.5)
WBC: 18.2 10*3/uL — ABNORMAL HIGH (ref 4.0–10.5)
nRBC: 0.3 % — ABNORMAL HIGH (ref 0.0–0.2)

## 2023-09-02 LAB — MAGNESIUM: Magnesium: 1.8 mg/dL (ref 1.7–2.4)

## 2023-09-02 LAB — BASIC METABOLIC PANEL
Anion gap: 11 (ref 5–15)
BUN: 34 mg/dL — ABNORMAL HIGH (ref 8–23)
CO2: 26 mmol/L (ref 22–32)
Calcium: 6.5 mg/dL — ABNORMAL LOW (ref 8.9–10.3)
Chloride: 97 mmol/L — ABNORMAL LOW (ref 98–111)
Creatinine, Ser: 2.1 mg/dL — ABNORMAL HIGH (ref 0.61–1.24)
GFR, Estimated: 34 mL/min — ABNORMAL LOW (ref >60)
Glucose, Bld: 109 mg/dL — ABNORMAL HIGH (ref 70–99)
Potassium: 4.5 mmol/L (ref 3.5–5.1)
Sodium: 134 mmol/L — ABNORMAL LOW (ref 135–145)

## 2023-09-02 LAB — GLUCOSE, CAPILLARY
Glucose-Capillary: 111 mg/dL — ABNORMAL HIGH (ref 70–99)
Glucose-Capillary: 113 mg/dL — ABNORMAL HIGH (ref 70–99)
Glucose-Capillary: 113 mg/dL — ABNORMAL HIGH (ref 70–99)
Glucose-Capillary: 62 mg/dL — ABNORMAL LOW (ref 70–99)
Glucose-Capillary: 84 mg/dL (ref 70–99)
Glucose-Capillary: 94 mg/dL (ref 70–99)
Glucose-Capillary: 96 mg/dL (ref 70–99)

## 2023-09-02 LAB — LACTIC ACID, PLASMA: Lactic Acid, Venous: 2.6 mmol/L (ref 0.5–1.9)

## 2023-09-02 LAB — LIPASE, BLOOD: Lipase: 149 U/L — ABNORMAL HIGH (ref 11–51)

## 2023-09-02 LAB — AMYLASE: Amylase: 739 U/L — ABNORMAL HIGH (ref 28–100)

## 2023-09-02 LAB — LIPOPROTEIN A (LPA): Lipoprotein (a): 95.5 nmol/L — ABNORMAL HIGH (ref <75.0)

## 2023-09-02 LAB — CK
Total CK: 6316 U/L — ABNORMAL HIGH (ref 49–397)
Total CK: 6317 U/L — ABNORMAL HIGH (ref 49–397)

## 2023-09-02 LAB — PHOSPHORUS: Phosphorus: 3.4 mg/dL (ref 2.5–4.6)

## 2023-09-02 LAB — TROPONIN I (HIGH SENSITIVITY): Troponin I (High Sensitivity): 14868 ng/L (ref <18)

## 2023-09-02 MED ORDER — IOHEXOL 9 MG/ML PO SOLN
500.0000 mL | ORAL | Status: AC
  Administered 2023-09-02 (×2): 500 mL via ORAL

## 2023-09-02 MED ORDER — SALINE SPRAY 0.65 % NA SOLN
1.0000 | NASAL | Status: DC | PRN
  Administered 2023-09-03: 1 via NASAL
  Filled 2023-09-02 (×2): qty 44

## 2023-09-02 MED ORDER — MAGNESIUM SULFATE 2 GM/50ML IV SOLN
2.0000 g | Freq: Once | INTRAVENOUS | Status: AC
  Administered 2023-09-02: 2 g via INTRAVENOUS
  Filled 2023-09-02: qty 50

## 2023-09-02 MED ORDER — INSULIN ASPART 100 UNIT/ML IJ SOLN
0.0000 [IU] | INTRAMUSCULAR | Status: DC
  Administered 2023-09-04 – 2023-09-05 (×2): 1 [IU] via SUBCUTANEOUS
  Filled 2023-09-02 (×2): qty 1

## 2023-09-02 MED ORDER — MORPHINE SULFATE (PF) 2 MG/ML IV SOLN
1.0000 mg | Freq: Once | INTRAVENOUS | Status: AC
  Administered 2023-09-02: 1 mg via INTRAVENOUS
  Filled 2023-09-02: qty 1

## 2023-09-02 NOTE — TOC CM/SW Note (Signed)
Transition of Care [facility]) - Inpatient Brief Assessment   Patient Details  Name: Mark Mack MRN: [id number] Date of Birth: Aug 02, 1956  [facility] of [facility]) CM/SW Contact:    Margarito Liner, LCSW Phone Number: 09/02/2023, 9:37 AM   Clinical Narrative: CSW reviewed chart. No TOC needs identified at this time. CSW will continue to follow progress. Please place [facility] consult if any needs arise.  Transition of Care Asessment: Insurance and Status: Insurance coverage has been reviewed Patient has primary care physician: Yes Home environment has been reviewed: Single family home Prior level of function:: Not documented Prior/Current Home Services: No current home services Social Determinants of Health Reivew: SDOH reviewed no interventions necessary Readmission risk has been reviewed: Yes Transition of care needs: no transition of care needs at this time

## 2023-09-02 NOTE — Progress Notes (Signed)
NAME:  Mark Mack, MRN:  [id number], DOB:  09-23-1956, LOS: 2 ADMISSION DATE:  08/31/2023 CONSULTATION DATE:  09/01/2023 CHIEF COMPLAINT:  Cardiac Arrest    History of Present Illness:  67 year old male presenting to the hospital with cardiac arrest.   Patient had a witnessed cardiac arrest at home and EMS was activated. CPR initiated at home prior to EMS, which continued, with ROSC achieved. Multiple rounds of CPR were completed secondary to continued loss of pulses. EKG was notable for inferior STEMI, and he is taken emergently to the cath lab for [facility] and will be admitted to the ICU afterwards.   In the cath lab, LHC performed showed occlusions in multiple vessels, though wire could not be passed and no revascularization was performed. Patient underwent a RHC with PCWP of 25 mmHg and mixed venous sat showing 33% saturation, with 69% saturation in the arterial circulation. This correlates to a cardiac output of 2.5 and cardiac index of 2.1.   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 He also has a history of HTN and smoking.  Pertinent  Medical History  Hypertension Coronary Artery Disease - S/P SABG (2019, 4 vessel) CHF Chronic Obstructive Pulmonary Disease ETOH  Significant Hospital Events: Including procedures, antibiotic start and stop dates in addition to other pertinent events   11/3: patient mechanically intubation requiring multiple vasopressors and sedation. CVC and arterial line placed given severe hemodynamic instability. Bronchoscopy completed at bedside with bloody secretions present. Epinephrine and tranexamic acid provided during the procedure. Patient attempted to have revascularization completed but was unable to do so. 11/4: Remains critically ill with increasing vasopressor requirements on mechanical ventilation. Remains on stress dose steroids. No longer requiring milrinone. 11/5: Remains critically ill although mechanical ventilation  requirements are decreasing. Patient remains on vasopressors. Possibility of ABD CT today.    Micro Data:  11/3: MRSA PCR >> negative 11/4: Blood cultures >>  Antimicrobials:  Zosyn   Interim History / Subjective:  Patient remains critically ill requiring mechanical ventilation with multi-vasopressor requirements. Vasopressor and ventilator requirements are decreasing. Possibility of ABD CT today.  Vent Mode: PRVC FiO2 (%):  [40 %-70 %] 40 % Set Rate:  [16 bmp-20 bmp] 20 bmp Vt Set:  [550 mL] 550 mL PEEP:  [8 cmH20-14 cmH20] 8 cmH20 Plateau Pressure:  [30 cmH20-31 cmH20] 31 cmH20   Objective   Blood pressure 120/66, pulse (!) 54, temperature (!) 97.2 F (36.2 C), temperature source Axillary, resp. rate (!) 21, height 5\' 6"  (1.676 m), weight 72.2 kg, SpO2 91%.    Vent Mode: PRVC FiO2 (%):  [40 %-70 %] 40 % Set Rate:  [16 bmp-20 bmp] 20 bmp Vt Set:  [550 mL] 550 mL PEEP:  [8 cmH20-14 cmH20] 8 cmH20 Plateau Pressure:  [30 cmH20-31 cmH20] 31 cmH20   Intake/Output Summary (Last 24 hours) at 09/02/2023 0858 Last data filed at 09/02/2023 0800 Gross per 24 hour  Intake 4366.97 ml  Output 1650 ml  Net 2716.97 ml   Filed Weights   08/31/23 1213 09/01/23 0457  Weight: 66.5 kg 72.2 kg    REVIEW OF SYSTEMS Patient is unable to provide full ROS due to severe critical illness  PHYSICAL EXAMINATION:  GENERAL: critically ill appearing, no acute distress, intubated EYES: pupils equal, round, non-reactive to light.  No scleral icterus.  MOUTH: Moist mucosal membrane. INTUBATED NECK: Supple.  PULMONARY: - rhonchi, - wheezing, diminished breath sounds bilaterally CARDIOVASCULAR: S1 and S2. Regular rate and  rhythm GASTROINTESTINAL: Soft, nontender, -distended. Positive bowel sounds.  MUSCULOSKELETAL: No swelling, clubbing, or edema.  NEUROLOGIC: sedated, increased movements today SKIN: normal, warm to touch, delayed capillary refill, pulses present bilaterally  1+    Labs/imaging that I havepersonally reviewed  (right click and "Reselect all SmartList Selections" daily)   ASSESSMENT AND PLAN SYNOPSIS 67 yo male who presented following a cardiac arrest, found to have a STEMI with multi-vessel occlusions, resulting in cardiogenic shock, metabolic  encephalopathy and AKI secondary to metabolic acidosis complicated by pancreatitis due to cocaine abuse. Patient was taken for emergent cath lab intervention but was unable to complete revascularization due to extensive occlusions. Hemodynamic instability requiring multiple vasopressors and mechanical ventilation.  NEURO #Metabolic Encephalopathy - RAAS goal of -1 - continue sedation as needed - wean sedation as tolerated - fentanyl and propofol, versed PRN - daily SAT as tolerated - Repeat Head CT: no evidence of anoxic injury or hemorrhage - EEG: diffuse encephalopathy without evidence of seizures - discontinued Keppra - UDS + benzo, cocaine, cannibinoid - Neuro following   CARDIOVASCULAR #Cardiac Arrest #Inferior ST Elevation Myocardial Infarction #Coronary Artery Disease - S/P CABG #Cardiogenic Shock secondary to STEMI - continuous ICU monitoring - vasopressors to keep MAP goal > 65 - continue levophed and vasopressin, discontinued epinephrine - no longer requiring milrinone - trend cardiac enzymes - Troponin [place] at [address], now starting to decline at 14868 - fluid resuscitation - continue zosyn for empiric coverage - follow blood cultures - trend ABG & lactic acid - lactic acid improving: 6.2, 2.9, 2.6 - TTE: EF of 67% - Cardiology following, appreciate input   PULMONARY #Acute Hypoxic and Hypercapnic Respiratory Failure #Pulmonary Contusion secondary to CPR #Alveolar Hemorrhage - continue mechanical ventilator support - continue bronchodilators as needed - wean FiO2 and PEEP as tolerated - SAT/SBT when parameters met, consider extubation if SAT & SBT passed and pt  tolerates - continue lung protective measures - Plateau Pressures < 30 as tolerated - Trend ABGs, this am pH 7.46, CO2 40 - CXRs as needed  - CTA ruled out pulmonary embolism - Chest CT: Lower lobe predominant consolidations and upper lobe predominant ground-glass opacities   GI #Pancreatitis #Transaminitis - GI prophylaxis, continue protonix - previous CTA showed high attenuation stranding adjacent to the pancreas - 11/5 ABD/Pelvic CT: persistent peripancreatic stranding, no evidence of hemorrhage - Lipase trends: 308, 127 - trend hepatic function - LFT elevation; AST 285, 567, and ALT 152, 168 - Constipation protocol - NPO for now   RENAL/ELECTROLYTES #Acute Kidney Injury secondary to Cardiogenic Shock vs. Metabolic Acidosis #Acute on Chronic Kidney Disease #Hypokalemia - Resolved #Rhabdomyolysis - strict I/O, continue foley catheter - fluid resuscitation as indicated, LR bolus - continue sodium bicarbonate - trend renal function - Creatinine 1.90, 2.28, 2.10 - BUN 21, 29, 34 - Elevated CK at 6317 - monitor ABG's, acidemia improving - trend BMP - replace electrolytes as needed - Potassium (4.5) and Magnesium (1.8) now stable following replacement - Calcium 6.7, 6.3, 6.5 - Phosphorus elevation improving; currently 3.4 - avoid nephrotoxic agents and renal dose medications as needed   ENDO - continue glycemic protocol - CBG - sliding scale insulin, currently using novolog - consider stopping hydrocortisone 50mg  Q6hr  HEME #Acute Anemia #Thrombocytopenia - DVT prophylaxis, SCDs only for now - Heparin, on hold for now given alveolar hemorrhage - trend CBC - monitor Hemoglobin trends, 12.5, 10.5 - consider transfusion if Hemoglobin < 7 - platelets 95 this am, monitor trends   ID #  Aspiration Pneumonia - trend WBC and monitor fever curve - remains afebrile - WBC trends: 8.9, 14.8, 15.4, 18.2 - continue Zosyn for aspiration pneumonia coverage, may consider  stopping - PCT <0.10    Best practice (right click and "Reselect all SmartList Selections" daily)  Diet: NPO Pain/Anxiety/Delirium protocol (if indicated): Yes (RASS goal -1) VAP protocol (if indicated): Yes DVT prophylaxis: Contraindicated GI prophylaxis: PPI Glucose control:  Insulin gtt Central venous access:  Yes, and it is still needed Arterial line:  Yes, and it is still needed Foley:  Yes, and it is still needed Mobility:  bed rest  Code Status: DNR but will intubate if needed Disposition:ICU  Labs   CBC: Recent Labs  Lab 08/31/23 0952 08/31/23 1200 08/31/23 1719 09/01/23 0307 09/02/23 0429  WBC 8.9  --  14.8* 15.4* 18.2*  NEUTROABS 2.7  --  11.8*  --   --   HGB 13.0 12.6*  12.2* 12.0* 12.5* 10.5*  HCT 43.6 37.0*  36.0* 37.4* 39.8 30.6*  MCV 93.6  --  88.8 88.4 83.2  PLT 157  --  168 143* 95*    Basic Metabolic Panel: Recent Labs  Lab 08/31/23 0952 08/31/23 1200 08/31/23 1719 09/01/23 0307 09/01/23 0821 09/01/23 2304 09/02/23 0429  NA 143 132*  129* 137 139  --  135 134*  K 4.2 4.3  4.3 3.0* 2.7* 3.7 4.8 4.5  CL 104  --  109 104  --  99 97*  CO2 18*  --  16* 21*  --  27 26  GLUCOSE 349*  --  227* 312*  --  101* 109*  BUN 15  --  21 29*  --  34* 34*  CREATININE 1.24  --  1.90* 2.28*  --  2.19* 2.10*  CALCIUM 10.1  --  6.8* 6.7*  --  6.3* 6.5*  MG 2.6*  --   --  1.8  --   --  1.8  PHOS 9.5*  --   --  6.0*  --   --  3.4   GFR: Estimated Creatinine Clearance: 31.2 mL/min (A) (by C-G formula based on SCr of 2.1 mg/dL (H)). Recent Labs  Lab 08/31/23 0952 08/31/23 1719 08/31/23 2211 09/01/23 0307 09/01/23 0441 09/01/23 0821 09/01/23 2304 09/02/23 0429  PROCALCITON <0.10  --   --   --   --   --   --   --   WBC 8.9 14.8*  --  15.4*  --   --   --  18.2*  LATICACIDVEN  --  6.9*   < >  --  8.2* 6.2* 2.9* 2.6*   < > = values in this interval not displayed.    Liver Function Tests: Recent Labs  Lab 08/31/23 0952 08/31/23 1719 09/01/23 0821   AST 285* 567* 419*  ALT 152* 168* 161*  ALKPHOS 134* 122 93  BILITOT 0.7 0.7 0.8  PROT 5.4* 4.5* 4.9*  ALBUMIN 2.5* 2.1* 2.5*   Recent Labs  Lab 08/31/23 0952 09/01/23 0307 09/01/23 0821 09/02/23 0429  LIPASE 35 308*  --  149*  AMYLASE  --   --  707* 739*   No results for input(s): "AMMONIA" in the last 168 hours.  ABG    Component Value Date/Time   PHART 7.46 (H) 09/01/2023 2305   PCO2ART 40 09/01/2023 2305   PO2ART 57 (L) 09/01/2023 2305   HCO3 28.4 (H) 09/01/2023 2305   TCO2 18 (L) 08/31/2023 1200   TCO2 16 (L) 08/31/2023 1200  ACIDBASEDEF 3.6 (H) 09/01/2023 1019   O2SAT 93.5 09/01/2023 2305     Coagulation Profile: Recent Labs  Lab 08/31/23 0952 09/01/23 0821  INR 1.5* 1.2    Cardiac Enzymes: Recent Labs  Lab 09/01/23 2304 09/02/23 0429  CKTOTAL 6,316* 6,317*    HbA1C: Hgb A1c MFr Bld  Date/Time Value Ref Range Status  08/31/2023 09:52 AM 5.8 (H) 4.8 - 5.6 % Final    Comment:    (NOTE) Pre diabetes:          5.7%-6.4%  Diabetes:              >6.4%  Glycemic control for   <7.0% adults with diabetes     CBG: Recent Labs  Lab 09/01/23 2050 09/01/23 2056 09/01/23 2326 09/02/23 0032 09/02/23 0433  GLUCAP 62* 84 64* 113* 96      Home Medications  Prior to Admission medications   Medication Sig Start Date End Date Taking? Authorizing Provider  acetaminophen (TYLENOL) 500 MG tablet Take 1,000 mg by mouth every 6 (six) hours as needed.     [provider]  aspirin EC 81 MG tablet Take 81 mg by mouth daily.     [provider]  docusate sodium (COLACE) 100 MG capsule Take 1 capsule (100 mg total) by mouth daily. Patient not taking: Reported on 05/28/2018 04/03/18   Ramonita Lab, MD  esomeprazole (NEXIUM) 20 MG packet Take 20 mg by mouth daily before breakfast.  04/01/18 05/01/18  [provider]  ezetimibe (ZETIA) 10 MG tablet Take 10 mg by mouth daily. 05/22/18   [provider]  ferrous sulfate 325 (65 FE)  MG EC tablet Take 1 tablet (325 mg total) by mouth daily with breakfast. Patient not taking: Reported on 05/28/2018 04/03/18 04/03/19  Ramonita Lab, MD  furosemide (LASIX) 20 MG tablet Take 20 mg by mouth daily. 03/29/18   [provider]  metoprolol succinate (TOPROL XL) 50 MG 24 hr tablet Take 50 mg by mouth daily.  02/13/18 02/13/19  [provider]  ranitidine (ZANTAC) 150 MG tablet  05/26/18   [provider]  rosuvastatin (CRESTOR) 20 MG tablet Take 1 tablet (20 mg total) by mouth at bedtime. Patient not taking: Reported on 05/28/2018 04/03/18 04/03/19  Ramonita Lab, MD  senna-docusate (SENOKOT-S) 8.6-50 MG tablet Take 1 tablet by mouth at bedtime as needed for mild constipation. Patient not taking: Reported on 05/28/2018 04/03/18   Ramonita Lab, MD     DVT/GI PRX  assessed I Assessed the need for Labs I Assessed the need for Foley I Assessed the need for Central Venous Line Family Discussion when available I Assessed the need for Mobilization I made an Assessment of medications to be adjusted accordingly Safety Risk assessment completed  CASE DISCUSSED IN MULTIDISCIPLINARY ROUNDS WITH ICU TEAM  Critical Care Time devoted to patient care services described in this note is 65 minutes.   Critical care was necessary to treat /prevent imminent and life-threatening deterioration.   PATIENT WITH VERY POOR PROGNOSIS I ANTICIPATE PROLONGED ICU LOS  Patient is critically ill. Patient with Multiorgan failure and at high risk for cardiac arrest and death.    Lucie Leather, M.D.  [facility]  Medical Director [facility] Medical Director [facility] Cardio-Pulmonary Department

## 2023-09-02 NOTE — Progress Notes (Addendum)
0700 Patient opens eyes to voice. Tries to site up. Moving all extremities.Grips with both hand and appropriately tries to pull at ETT. Nodds head to some questions. 0830 CT of abdomen ordered.  OG pulled back to 60 cm and re-x-rayed for proper placement. First 500 mls of contrast put down OG. 0930 Per contrast instructions,second 500 mls of contrast fluid placed down OG. 1030 Dr Amada Jupiter (neurology) in to assess patient.  1100 Bed and bath done.Patient resist turning for sheet change. 1200 Family in after bath. Peep on ventilator down to 5.0. Patient arouses to voice. 1500 Extubated to 75% HFNC 60 liters. At first he was sluggish to take deep breathes. Cannot see at times but tracks with both eyes. Smile is symmetrical. Moving all extremities to command. Does not care for HFNC. 1600 Tolerating extubation well, oxygen saturations 88-90% 1800 Family in to see patient..Tolerating extubation.

## 2023-09-02 NOTE — Progress Notes (Signed)
NEUROLOGY CONSULT FOLLOW UP NOTE   Date of service: September 02, 2023 Patient Name: Austan Nicholl MRN:  [id number] DOB:  1955/12/31  Brief HPI  Other Atienza is a 67 y.o. male who presented with cardiac arrest on 11/3.  There was initially concern for neurological compromise and therefore neurology was consulted yesterday.  He appears to be waking up, there was some concern about not moving his left leg yesterday as well.   Interval Hx/subjective   He did receive some sedation for CT shortly before I saw him, but he is much more awake than he has been.  Vitals   Vitals:   09/02/23 1215 09/02/23 1230 09/02/23 1245 09/02/23 1300  BP:    107/63  Pulse: (!) 55 (!) 51 (!) 50 (!) 50  Resp: 20 20 20 20   Temp:      TempSrc:      SpO2: 91% 90% 91% 92%  Weight:      Height:         Body mass index is 25.69 kg/m.  Physical Exam   General: In bed, intubated  Neurologic Examination   MS: Awakens to minor stimulation, opens eyes, fixates and tracks, follows commands though not reliably CN: Pupils are equal round and reactive, extraocular movements are intact, face is symmetric. Motor: He withdraws to noxious stimulation in all four extremities, will not hold either leg aloft.  He wiggles toes on both feet to command Sensory: As above  Labs and Diagnostic Imaging   CBC:  Recent Labs  Lab 08/31/23 0952 08/31/23 1200 08/31/23 1719 09/01/23 0307 09/02/23 0429  WBC 8.9  --  14.8* 15.4* 18.2*  NEUTROABS 2.7  --  11.8*  --   --   HGB 13.0   < > 12.0* 12.5* 10.5*  HCT 43.6   < > 37.4* 39.8 30.6*  MCV 93.6  --  88.8 88.4 83.2  PLT 157  --  168 143* 95*   < > = values in this interval not displayed.    Basic Metabolic Panel:  Lab Results  Component Value Date   NA 134 (L) 09/02/2023   K 4.5 09/02/2023   CO2 26 09/02/2023   GLUCOSE 109 (H) 09/02/2023   BUN 34 (H) 09/02/2023   CREATININE 2.10 (H) 09/02/2023   CALCIUM 6.5 (L) 09/02/2023   GFRNONAA 34 (L) 09/02/2023   GFRAA >60  06/03/2020   Lipid Panel:  Lab Results  Component Value Date   LDLCALC 57 08/31/2023   HgbA1c:  Lab Results  Component Value Date   HGBA1C 5.8 (H) 08/31/2023   Urine Drug Screen:     Component Value Date/Time   LABOPIA NONE DETECTED 08/31/2023 2211   COCAINSCRNUR POSITIVE (A) 08/31/2023 2211   LABBENZ POSITIVE (A) 08/31/2023 2211   AMPHETMU NONE DETECTED 08/31/2023 2211   THCU POSITIVE (A) 08/31/2023 2211   LABBARB NONE DETECTED 08/31/2023 2211    Alcohol Level     Component Value Date/Time   ETH 82 (H) 06/03/2020 0807   INR  Lab Results  Component Value Date   INR 1.2 09/01/2023   APTT  Lab Results  Component Value Date   APTT 67 (H) 08/31/2023    Impression   Rafael Salway is a 67 y.o. 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  At this point, with negative repeat imaging, my suspicion is low for significant insult.  If after extubation, there is concern for continued neurological abnormality, then neurology will be available and an MRI  could be obtained.  Recommendations  Neurology will be available as needed, please call if further questions or concerns. ______________________________________________________________________   Thank you for the opportunity to take part in the care of this patient. If you have any further questions, please contact the neurology consultation team on call. Updated oncall schedule is listed on AMION.  Signed,  Ritta Slot, MD [facility] [phone]  If 7pm- 7am, please page neurology on call as listed in AMION.

## 2023-09-02 NOTE — Inpatient Diabetes Management (Signed)
Inpatient Diabetes Program Recommendations  AACE/ADA: New Consensus Statement on Inpatient Glycemic Control (2015)  Target Ranges:  Prepandial:   less than 140 mg/dL      Peak postprandial:   less than 180 mg/dL (1-2 hours)      Critically ill patients:  140 - 180 mg/dL   Lab Results  Component Value Date   GLUCAP 113 (H) 09/02/2023   HGBA1C 5.8 (H) 08/31/2023    Latest Reference Range & Units 09/01/23 16:23 09/01/23 20:50 09/01/23 20:56 09/01/23 23:26 09/02/23 00:32 09/02/23 04:33 09/02/23 09:08  Glucose-Capillary 70 - 99 mg/dL 259 (H) 62 (L) 84 64 (L) 113 (H) 96 113 (H)  (H): Data is abnormally high (L): Data is abnormally low  Review of Glycemic Control  Diabetes history: No hx noted Current orders for Inpatient glycemic control: Novolog 0-9 units q 4 hrs.  Inpatient Diabetes Program Recommendations:   Patient had hypoglycemia. Please consider: -Decrease Novolog correction to 0-6 units q 4 hrs.  Thank you, Mark Mack. Raynald Rouillard, RN, MSN, CDCES  Diabetes Coordinator Inpatient Glycemic Control Team Team Pager [phone] (8am-5pm) 09/02/2023 10:15 AM

## 2023-09-02 NOTE — Consult Note (Cosign Needed Addendum)
Consultation Note Date: 09/02/2023   Patient Name: Mark Mack  DOB: 1956/07/19  MRN: [id number]  Age / Sex: 67 y.o., male  PCP: Elane Fritz, MD Referring Physician: Erin Fulling, MD  Reason for Consultation: Establishing goals of care  HPI/Patient Profile: 67 year old male presenting to the hospital with cardiac arrest.   Patient had a witnessed cardiac arrest at home and EMS was activated. CPR initiated at home prior to EMS, which continued, with ROSC achieved. He had multiple rounds of CPR secondary to continued loss of pulses. EKG was notable for inferior STEMI, and he is taken emergently to the cath lab for [facility] and will be admitted to the ICU afterwards.  Clinical Assessment and Goals of Care: Notes and labs reviewed.  In to see patient.  He is currently sitting in bed intubated with mittens in place.  He is awake and alert looking around the room.  His son is at bedside.  Son advises his mother went home to get some sleep.  Patient's son states patient has been married for 42 years and has 2 children.  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  He states at baseline his father is fully independent, and from a functional standpoint, was changing a vehicle tire over the weekend.  He states his father has been different, and more anxious over the past year.  He discusses the updates that he has been provided.  He states his father has a remote history of drug use, but he was unaware that he had current drug use.  He states he drinks alcohol on the weekends.  He states he is hopeful his father's status will improve.  While at bedside, attending to bedside to discuss the prospect of extubation.  Patient currently has a DNR status.      SUMMARY OF RECOMMENDATIONS   PMT will follow for support.  Prognosis:  Unable to determine        Primary Diagnoses: Present on Admission:   Cardiac arrest (HCC)  STEMI (ST elevation myocardial infarction) (HCC)   I have reviewed the medical record, interviewed the patient and family, and examined the patient. The following aspects are pertinent.  Past Medical History:  Diagnosis Date   Hypertension    Social History   Socioeconomic History   Marital status: Married    Spouse name: Not on file   Number of children: Not on file   Years of education: Not on file   Highest education level: Not on file  Occupational History   Not on file  Tobacco Use   Smoking status: Former    Current packs/day: 0.00    Types: Cigarettes    Quit date: 03/23/2018    Years since quitting: 5.4   Smokeless tobacco: Never  Substance and Sexual Activity   Alcohol use: Not on file    Comment: occassional    Drug use: Not on file   Sexual activity: Not on file  Other Topics Concern   Not on file  Social History Narrative   Not on file   Social Determinants of Health   Financial Resource Strain: Low Risk  (04/15/2018)   Received from [facility]   Overall Financial Resource Strain (CARDIA)    Difficulty of Paying Living Expenses: Not hard at all  Food Insecurity: No Food Insecurity (09/01/2023)   Hunger Vital Sign    Worried About Running Out of Food in the Last Year: Never true    Ran Out of Food in the Last Year: Never true  Transportation Needs: No Transportation Needs (09/01/2023)   PRAPARE - Administrator, Civil Service (Medical): No    Lack of Transportation (Non-Medical): No  Physical Activity: Sufficiently Active (04/15/2018)   Received from [facility]   Exercise Vital Sign    Days of Exercise per Week: 5 days    Minutes of Exercise per Session: 50 min  Stress: Not on file  Social Connections: Unknown (11/02/2019)   Received from [facility]   Social Connection and Isolation Panel [NHANES]    Frequency of Communication with Friends and Family: Not on file    Frequency of Social Gatherings with  Friends and Family: Not on file    Attends Religious Services: Not on file    Active Member of Clubs or Organizations: Not on file    Attends Banker Meetings: Not on file    Marital Status: Married   No family history on file. Scheduled Meds:  Chlorhexidine Gluconate Cloth  6 each Topical Q0600   docusate  100 mg Per Tube BID   folic acid  1 mg Intravenous Daily   hydrocortisone sod succinate (SOLU-CORTEF) inj  50 mg Intravenous Q6H   insulin aspart  0-6 Units Subcutaneous Q4H   mouth rinse  15 mL Mouth Rinse Q2H   pantoprazole (PROTONIX) IV  40 mg Intravenous QHS   thiamine (VITAMIN B1) injection  100 mg Intravenous Daily   Continuous Infusions:  norepinephrine (LEVOPHED) Adult infusion 8 mcg/min (09/02/23 1302)   piperacillin-tazobactam (ZOSYN)  IV 3.375 g (09/02/23 1321)   vasopressin 0.03 Units/min (09/02/23 0700)   PRN Meds:.docusate sodium, midazolam, ondansetron (ZOFRAN) IV, mouth rinse, polyethylene glycol Medications Prior to Admission:  Prior to Admission medications   Medication Sig Start Date End Date Taking? Authorizing Provider  acetaminophen (TYLENOL) 500 MG tablet Take 1,000 mg by mouth every 6 (six) hours as needed.    Yes [provider]  Telmisartan-amLODIPine 80-10 MG TABS Take 1 tablet by mouth daily.   Yes [provider]  metoprolol succinate (TOPROL XL) 50 MG 24 hr tablet Take 50 mg by mouth daily.  02/13/18 02/13/19  [provider]   No Known Allergies Review of Systems  Unable to perform ROS   Physical Exam Pulmonary:     Comments: On ventilator Skin:    General: Skin is warm and dry.  Neurological:     Mental Status: He is alert.     Vital Signs: BP 105/70   Pulse 60   Temp 97.7 F (36.5 C) (Rectal)   Resp (!) 26   Ht 5\' 6"  (1.676 m)   Wt 72.2 kg   SpO2 93%   BMI 25.69 kg/m  Pain Scale: CPOT POSS *See Group Information*: S-Acceptable,Sleep, easy to arouse Pain Score: 0-No pain   SpO2: SpO2:  93 % O2 Device:SpO2: 93 % O2 Flow Rate: .O2 Flow Rate (L/min): 60 L/min  IO: Intake/output summary:  Intake/Output Summary (Last 24 hours) at  09/02/2023 1604 Last data filed at 09/02/2023 0900 Gross per 24 hour  Intake 4366.97 ml  Output 2100 ml  Net 2266.97 ml    LBM: Last BM Date : 08/30/23 Baseline Weight: Weight: 66.5 kg Most recent weight: Weight: 72.2 kg       Signed by: Morton Stall, NP   Please contact Palliative Medicine Team phone at [phone] for questions and concerns.  For individual provider: See Loretha Stapler

## 2023-09-02 NOTE — Progress Notes (Signed)
[facility] CLINIC CARDIOLOGY PROGRESS NOTE   Patient ID: Mark Mack MRN: [id number] DOB/AGE: [birth year] 66 y.o.  Admit date: 08/31/2023 Primary Physician Hillery Hunter, Graylin Shiver, MD  Primary Cardiologist Dr. Hessie Dibble [facility] - last seen 2019)  HPI: Mark Mack is a 67 y.o. male with a past medical history of CAD s/p CABG x 4 2019, hypertension, hyperlipidemia who was brought to the ED on 08/31/2023 for witnessed cardiac arrest. When ROSC was obtained there was concern for inferolateral STEMI and patient was taken emergently to the cath lab.   Interval History:  -Patient remains intubated and sedated.  -Epi gtt was weaned off, remains on levo and vasopressin gtts. BP improved from yesterday.  -HR remains controlled in NSR.   Review of systems complete and found to be negative unless listed above    Vitals:   09/02/23 0400 09/02/23 0500 09/02/23 0600 09/02/23 0700  BP: 130/79 139/76 120/71 128/74  Pulse: (!) 54 (!) 55 (!) 56 (!) 54  Resp: 20 20 20 20   Temp: (!) 97.2 F (36.2 C)     TempSrc: Axillary     SpO2: (!) 88% 92% 92% 91%  Weight:      Height:         Intake/Output Summary (Last 24 hours) at 09/02/2023 0817 Last data filed at 09/02/2023 0700 Gross per 24 hour  Intake 4366.97 ml  Output 1500 ml  Net 2866.97 ml     PHYSICAL EXAM General: Critically ill appearing, intubated & sedated.  HEENT: Normocephalic and atraumatic. Neck: No JVD.  Lungs: Diminished bilaterally.  Heart: HRRR. Normal S1 and S2 without gallops or murmurs. Radial & DP pulses 2+ bilaterally. Abdomen: Non-distended appearing.  Msk: Normal strength and tone for age. Extremities: No clubbing, cyanosis or edema.     LABS: Basic Metabolic Panel: Recent Labs    09/01/23 0307 09/01/23 0821 09/01/23 2304 09/02/23 0429  NA 139  --  135 134*  K 2.7*   < > 4.8 4.5  CL 104  --  99 97*  CO2 21*  --  27 26  GLUCOSE 312*  --  101* 109*  BUN 29*  --  34* 34*  CREATININE 2.28*  --  2.19* 2.10*  CALCIUM 6.7*   --  6.3* 6.5*  MG 1.8  --   --  1.8  PHOS 6.0*  --   --  3.4   < > = values in this interval not displayed.   Liver Function Tests: Recent Labs    08/31/23 1719 09/01/23 0821  AST 567* 419*  ALT 168* 161*  ALKPHOS 122 93  BILITOT 0.7 0.8  PROT 4.5* 4.9*  ALBUMIN 2.1* 2.5*   Recent Labs    09/01/23 0307 09/01/23 0821 09/02/23 0429  LIPASE 308*  --  149*  AMYLASE  --  707* 739*   CBC: Recent Labs    08/31/23 0952 08/31/23 1200 08/31/23 1719 09/01/23 0307 09/02/23 0429  WBC 8.9  --  14.8* 15.4* 18.2*  NEUTROABS 2.7  --  11.8*  --   --   HGB 13.0   < > 12.0* 12.5* 10.5*  HCT 43.6   < > 37.4* 39.8 30.6*  MCV 93.6  --  88.8 88.4 83.2  PLT 157  --  168 143* 95*   < > = values in this interval not displayed.   Cardiac Enzymes: Recent Labs    09/01/23 1748 09/01/23 2304 09/02/23 0429  CKTOTAL  --  6,316* 6,317*  TROPONINIHS 14,097* 16,113* 40,981*  BNP: No results for input(s): "BNP" in the last 72 hours. D-Dimer: No results for input(s): "DDIMER" in the last 72 hours. Hemoglobin A1C: Recent Labs    08/31/23 0952  HGBA1C 5.8*   Fasting Lipid Panel: Recent Labs    08/31/23 0952 09/01/23 0307  CHOL 153  --   HDL 62  --   LDLCALC 57  --   TRIG 171* 72  CHOLHDL 2.5  --    Thyroid Function Tests: No results for input(s): "TSH", "T4TOTAL", "T3FREE", "THYROIDAB" in the last 72 hours.  Invalid input(s): "FREET3" Anemia Panel: No results for input(s): "VITAMINB12", "FOLATE", "FERRITIN", "TIBC", "IRON", "RETICCTPCT" in the last 72 hours.  ECHOCARDIOGRAM COMPLETE  Result Date: 09/01/2023    ECHOCARDIOGRAM REPORT   Patient Name:   Mark Mack Date of Exam: 09/01/2023 Medical Rec #:  [id number]   Height:       66.0 in Accession #:    [id number]  Weight:       159.2 lb Date of Birth:  [birth year]    BSA:          1.815 m Patient Age:    67 years    BP:           118/72 mmHg Patient Gender: M           HR:           66 bpm. Exam Location:  [facility] Procedure: 2D Echo,  Cardiac Doppler and Color Doppler Indications:     Cardiac Arrest  History:         Patient has no prior history of Echocardiogram examinations.                  CAD, Signs/Symptoms:Chest Pain; Risk Factors:Hypertension and                  Dyslipidemia. Cardiac arrest.  Sonographer:     Mikki Harbor Referring Phys:  [id number] DWAYNE D CALLWOOD Diagnosing Phys: Clotilde Dieter  Sonographer Comments: Echo performed with patient supine and on artificial respirator. IMPRESSIONS  1. Left ventricular ejection fraction, by estimation, is 60 to 65%. Left ventricular ejection fraction by PLAX is 67 %. The left ventricle has normal function. The left ventricle has no regional wall motion abnormalities. Left ventricular diastolic parameters are consistent with Grade I diastolic dysfunction (impaired relaxation).  2. Right ventricular systolic function is low normal. The right ventricular size is mildly enlarged. There is mildly elevated pulmonary artery systolic pressure. The estimated right ventricular systolic pressure is 38.4 mmHg.  3. Right atrial size was mildly dilated.  4. A small pericardial effusion is present. There is no evidence of cardiac tamponade.  5. The mitral valve is grossly normal. Trivial mitral valve regurgitation.  6. The aortic valve is grossly normal. Aortic valve regurgitation is not visualized. No aortic stenosis is present. FINDINGS  Left Ventricle: Left ventricular ejection fraction, by estimation, is 60 to 65%. Left ventricular ejection fraction by PLAX is 67 %. The left ventricle has normal function. The left ventricle has no regional wall motion abnormalities. The left ventricular internal cavity size was normal in size. There is no left ventricular hypertrophy. Left ventricular diastolic parameters are consistent with Grade I diastolic dysfunction (impaired relaxation). Right Ventricle: The right ventricular size is mildly enlarged. No increase in right ventricular wall thickness. Right  ventricular systolic function is low normal. There is mildly elevated pulmonary artery systolic pressure. The tricuspid regurgitant velocity is 2.42 m/s, and with an assumed right  atrial pressure of 15 mmHg, the estimated right ventricular systolic pressure is 38.4 mmHg. Left Atrium: Left atrial size was normal in size. Right Atrium: Right atrial size was mildly dilated. Pericardium: A small pericardial effusion is present. There is no evidence of cardiac tamponade. Mitral Valve: The mitral valve is grossly normal. Trivial mitral valve regurgitation. MV peak gradient, 2.0 mmHg. The mean mitral valve gradient is 1.0 mmHg. Tricuspid Valve: The tricuspid valve is grossly normal. Tricuspid valve regurgitation is trivial. Aortic Valve: The aortic valve is grossly normal. Aortic valve regurgitation is not visualized. No aortic stenosis is present. Aortic valve mean gradient measures 5.0 mmHg. Aortic valve peak gradient measures 8.9 mmHg. Aortic valve area, by VTI measures 2.59 cm. Pulmonic Valve: The pulmonic valve was grossly normal. Pulmonic valve regurgitation is mild to moderate. Aorta: The aortic root is normal in size and structure. IAS/Shunts: No atrial level shunt detected by color flow Doppler.  LEFT VENTRICLE PLAX 2D LV EF:         Left            Diastology                ventricular     LV e' medial:    8.05 cm/s                ejection        LV E/e' medial:  7.7                fraction by     LV e' lateral:   7.29 cm/s                PLAX is 67      LV E/e' lateral: 8.5                %. LVIDd:         3.60 cm LVIDs:         2.30 cm LV PW:         1.20 cm LV IVS:        1.30 cm LVOT diam:     2.00 cm LV SV:         63 LV SV Index:   35 LVOT Area:     3.14 cm  RIGHT VENTRICLE RV Basal diam:  4.20 cm RV Mid diam:    3.30 cm RV S prime:     5.44 cm/s LEFT ATRIUM             Index        RIGHT ATRIUM           Index LA diam:        2.90 cm 1.60 cm/m   RA Area:     19.40 cm LA Vol (A2C):   31.0 ml 17.08 ml/m   RA Volume:   59.30 ml  32.67 ml/m LA Vol (A4C):   19.1 ml 10.52 ml/m LA Biplane Vol: 24.9 ml 13.72 ml/m  AORTIC VALVE                     PULMONIC VALVE AV Area (Vmax):    2.72 cm      PV Vmax:       0.91 m/s AV Area (Vmean):   2.52 cm      PV Peak grad:  3.3 mmHg AV Area (VTI):     2.59 cm AV Vmax:           149.00  cm/s AV Vmean:          102.000 cm/s AV VTI:            0.244 m AV Peak Grad:      8.9 mmHg AV Mean Grad:      5.0 mmHg LVOT Vmax:         129.00 cm/s LVOT Vmean:        81.900 cm/s LVOT VTI:          0.201 m LVOT/AV VTI ratio: 0.82  AORTA Ao Root diam: 3.40 cm MITRAL VALVE               TRICUSPID VALVE MV Area (PHT): 3.01 cm    TR Peak grad:   23.4 mmHg MV Area VTI:   2.90 cm    TR Vmax:        242.00 cm/s MV Peak grad:  2.0 mmHg MV Mean grad:  1.0 mmHg    SHUNTS MV Vmax:       0.71 m/s    Systemic VTI:  0.20 m MV Vmean:      34.2 cm/s   Systemic Diam: 2.00 cm MV Decel Time: 252 msec MV E velocity: 62.10 cm/s MV A velocity: 47.60 cm/s MV E/A ratio:  1.30 Designer, multimedia signed by Clotilde Dieter Signature Date/Time: 09/01/2023/2:02:39 PM    Final    EEG adult  Result Date: 09/01/2023 Charlsie Quest, MD     09/01/2023 12:29 PM Patient Name: Linsey Hirota MRN: [id number] Epilepsy Attending: Charlsie Quest Referring Physician/Provider: Milon Dikes, MD Date: 09/01/2023 Duration: 31.32 mins Patient history: 67 year old male status post cardiac arrest.  EEG to alert for seizure. Level of alertness: comatose AEDs during EEG study: Propofol Technical aspects: This EEG study was done with scalp electrodes positioned according to the 10-20 International system of electrode placement. Electrical activity was reviewed with band pass filter of 1-70Hz , sensitivity of 7 uV/mm, display speed of 62mm/sec with a 60Hz  notched filter applied as appropriate. EEG data were recorded continuously and digitally stored.  Video monitoring was available and reviewed as appropriate. Description: EEG  showed continuous generalized polymorphic mixed frequencies with predominantly 5 to 9 Hz theta-alpha activity admixed with intermittent generalized 2 to 3 Hz delta slowing.  Hyperventilation and photic stimulation were not performed.   ABNORMALITY - Continuous slow, generalized IMPRESSION: This study is suggestive of moderate diffuse encephalopathy. No seizures or epileptiform discharges were seen throughout the recording. Charlsie Quest   DG Abd 1 View  Result Date: 09/01/2023 CLINICAL DATA:  OG tube placement EXAM: ABDOMEN - 1 VIEW COMPARISON:  None Available. FINDINGS: Enteric tube terminates in the proximal duodenum. IMPRESSION: Enteric tube terminates in the proximal duodenum. Electronically Signed   By: Charline Bills M.D.   On: 09/01/2023 01:26   DG Chest Port 1 View  Result Date: 08/31/2023 CLINICAL DATA:  Central line placement EXAM: PORTABLE CHEST 1 VIEW COMPARISON:  Same day CT FINDINGS: Interval placement of right internal jugular approach central venous catheter with distal tip terminating near the superior cavoatrial junction. Endotracheal tube has been retracted and now terminates 4.6 cm above the carina. Stable heart size. Aortic atherosclerosis. Diffuse bilateral airspace consolidations. No pleural effusion or pneumothorax. IMPRESSION: 1. Interval placement of right internal jugular approach central venous catheter with distal tip terminating near the superior cavoatrial junction. No pneumothorax. 2. Endotracheal tube has been retracted and now terminates 4.6 cm above the carina. 3. Persistent diffuse bilateral airspace consolidations. Electronically Signed  By: Duanne Guess D.O.   On: 08/31/2023 17:05   CT HEAD WO CONTRAST ( )  Addendum Date: 08/31/2023   ADDENDUM REPORT: 08/31/2023 15:33 ADDENDUM: The intravenous contrast present on the CT head is actually from prior cardiac catheterization, and not from the concurrently obtained CTA chest as initially reported.  Electronically Signed   By: Lesia Hausen M.D.   On: 08/31/2023 15:33   Result Date: 08/31/2023 CLINICAL DATA:  Altered mental status EXAM: CT HEAD WITHOUT CONTRAST TECHNIQUE: Contiguous axial images were obtained from the base of the skull through the vertex without intravenous contrast. RADIATION DOSE REDUCTION: This exam was performed according to the departmental dose-optimization program which includes automated exposure control, adjustment of the mA and/or kV according to patient size and/or use of iterative reconstruction technique. COMPARISON:  None Available. FINDINGS: The study is postcontrast due to prior contrast administration from a concurrently obtained CTA chest. This reduces sensitivity for subarachnoid hemorrhage. Brain: There is no definite acute intracranial hemorrhage or extra-axial fluid collection. There is no acute territorial infarct. Parenchymal volume is normal. The ventricles are normal in size. Gray-white differentiation is preserved. Hypodensity in the supratentorial white matter likely reflects sequela of underlying chronic small-vessel ischemic change. The pituitary and suprasellar region are normal there is no mass lesion or abnormal enhancement. There is no mass effect or midline shift. Vascular: The major vessels enhance normally. Skull: Normal. Negative for fracture or focal lesion. Sinuses/Orbits: There is complete opacification of the right sphenoid sinus. The globes and orbits are unremarkable. Other: The mastoid air cells and middle ear cavities are clear. IMPRESSION: The study is obtained postcontrast due to contrast administration from the concurrently obtained CTA chest. This reduces sensitivity for detection of subarachnoid hemorrhage. Within this confine, no evidence of acute intracranial pathology. Electronically Signed: By: Lesia Hausen M.D. On: 08/31/2023 13:26   CT Angio Chest Pulmonary Embolism (PE) W or WO Contrast  Result Date: 08/31/2023 CLINICAL DATA:   Witnessed cardiac arrest EXAM: CT ANGIOGRAPHY CHEST WITH CONTRAST TECHNIQUE: Multidetector CT imaging of the chest was performed using the standard protocol during bolus administration of intravenous contrast. Multiplanar CT image reconstructions and MIPs were obtained to evaluate the vascular anatomy. RADIATION DOSE REDUCTION: This exam was performed according to the departmental dose-optimization program which includes automated exposure control, adjustment of the mA and/or kV according to patient size and/or use of iterative reconstruction technique. CONTRAST:  75mL OMNIPAQUE IOHEXOL 350 MG/ML SOLN COMPARISON:  None Available. FINDINGS: Cardiovascular: No evidence of pulmonary embolus. Normal heart size. No pericardial effusion. Normal caliber thoracic aorta with moderate atherosclerotic disease. 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 Mediastinum/Nodes: Esophagus and thyroid are unremarkable. No enlarged lymph nodes seen in the chest. Lungs/Pleura: ETT is in the distal trachea. Consolidations of the bilateral lower lobes. Central and upper lung predominant patchy ground-glass opacities. No pneumothorax. Upper Abdomen: High attenuation stranding adjacent to the partially visualized pancreas, concerning for hemorrhagic pancreatitis. Musculoskeletal: Prior median sternotomy with intact sternal wires. Nondisplaced fractures of the anterior right fourth through sixth ribs. Review of the MIP images confirms the above findings. IMPRESSION: 1. No evidence of pulmonary embolus. 2. Consolidations of the bilateral lower lobes and central and upper lobe predominant ground-glass opacities, likely due to a combination of aspiration and pulmonary edema. 3. High attenuation stranding adjacent to the partially visualized pancreas, concerning for hemorrhagic pancreatitis. Correlate with lipase and consider dedicated abdomen and pelvis CT. 4. Nondisplaced fractures of the anterior right fourth through sixth ribs.  5. Severe  coronary artery calcifications status post CABG. 6. Aortic Atherosclerosis (ICD10-I70.0). Electronically Signed   By: Allegra Lai M.D.   On: 08/31/2023 12:39     ECHO as above  TELEMETRY reviewed by me 09/02/23: sinus rhythm/sinus brady rate 50-60s  EKG reviewed by me 09/02/23: no EKG for review this admission  DATA reviewed by me 09/02/23: last 24h vitals tele labs imaging I/O, PCCM progress note, neurology note  Principal Problem:   Cardiac arrest [facility]) Active Problems:   STEMI (ST elevation myocardial infarction) (HCC)    ASSESSMENT AND PLAN: Amori Colomb is a 67 y.o. male with a past medical history of CAD s/p CABG x 4 2019, hypertension, hyperlipidemia who was brought to the ED on 08/31/2023 for witnessed cardiac arrest. When ROSC was obtained there was concern for inferolateral STEMI and patient was taken emergently to the cath lab.   # Cardiac arrest # STEMI # Coronary artery disease s/p CABG x4 2019 Patient with witnessed cardiac arrest, ROSC obtained and EKG concerning for inferolateral STEMI. Patient taken urgently to cath lab 11/3, unsuccessful attempts to cross ostial LCx and prox-mid RCA lesions. PCWP of 25 mmHg. Requiring multiple vasopressors, was on milrinone but this was discontinued overnight. Echo yesterday demonstrated preserved EF.  -Continue BP support.  -Would recommend plavix when able to take PO.  -Heparin held given concern for alveolar hemorrhage.  # Acute Hypoxic and Hypercapnic Respiratory Failure # Pulmonary contusion # Bland alveolar hemorrhage Remains intubated on vent support. Bronch done 11/3 demonstrated alveolar hemorrhage likely 2/2 pulmonary contusion.  -Management per PCCM.   #AKI on CKD #Metabolic acidosis Cr 1.24 on admission, 2.10 on AM labs today. -Continue to monitor renal function closely.    This patient's case was discussed and created with Dr. Melton Alar and she is in agreement.  Signed:  [name], PA-C   09/02/2023, 8:17 AM [facility] Cardiology

## 2023-09-02 NOTE — Progress Notes (Addendum)
Extubation order written. Cuff leak noted.  Patient extubated and placed on HHFNC 60L @ 75% @1505 

## 2023-09-02 NOTE — Plan of Care (Signed)
?  Problem: Clinical Measurements: ?Goal: Ability to maintain clinical measurements within normal limits will improve ?Outcome: Progressing ?Goal: Will remain free from infection ?Outcome: Progressing ?Goal: Diagnostic test results will improve ?Outcome: Progressing ?  ?

## 2023-09-02 NOTE — Progress Notes (Signed)
PHARMACY CONSULT NOTE - FOLLOW UP  Pharmacy Consult for Electrolyte Monitoring and Replacement   Recent Labs: Potassium (mmol/L)  Date Value  09/02/2023 4.5   Magnesium (mg/dL)  Date Value  13/05/6577 1.8   Calcium (mg/dL)  Date Value  46/96/2952 6.5 (L)   Albumin (g/dL)  Date Value  84/13/2440 2.5 (L)   Phosphorus (mg/dL)  Date Value  08/24/2535 3.4   Sodium (mmol/L)  Date Value  09/02/2023 134 (L)   Corrected Ca: 7.7 mg/dL  Assessment: 67 y.o. male with a past medical history of CAD s/p CABG x 4 2019, hypertension, hyperlipidemia who was brought to the ED on 08/31/2023 for witnessed cardiac arrest. Pharmacy is asked to follow and replace electrolytes while in CCU.   Goal of Therapy:  Electrolytes WNL  Plan:  Mag 1.8; will order magnesium sulfate 2 gm IV x 1 F/u electrolytes tomorrow with AM labs   Littie Deeds, PharmD Pharmacy Resident  09/02/2023 7:10 AM

## 2023-09-02 NOTE — Plan of Care (Addendum)
Consult noted for GOC. PMT to see patient and speak with family.

## 2023-09-03 DIAGNOSIS — H539 Unspecified visual disturbance: Secondary | ICD-10-CM | POA: Diagnosis not present

## 2023-09-03 DIAGNOSIS — I469 Cardiac arrest, cause unspecified: Secondary | ICD-10-CM

## 2023-09-03 LAB — CBC
HCT: 30.4 % — ABNORMAL LOW (ref 39.0–52.0)
Hemoglobin: 10 g/dL — ABNORMAL LOW (ref 13.0–17.0)
MCH: 27.6 pg (ref 26.0–34.0)
MCHC: 32.9 g/dL (ref 30.0–36.0)
MCV: 84 fL (ref 80.0–100.0)
Platelets: 90 10*3/uL — ABNORMAL LOW (ref 150–400)
RBC: 3.62 MIL/uL — ABNORMAL LOW (ref 4.22–5.81)
RDW: 14.9 % (ref 11.5–15.5)
WBC: 19.9 10*3/uL — ABNORMAL HIGH (ref 4.0–10.5)
nRBC: 0.3 % — ABNORMAL HIGH (ref 0.0–0.2)

## 2023-09-03 LAB — MAGNESIUM: Magnesium: 2.6 mg/dL — ABNORMAL HIGH (ref 1.7–2.4)

## 2023-09-03 LAB — BASIC METABOLIC PANEL
Anion gap: 9 (ref 5–15)
BUN: 36 mg/dL — ABNORMAL HIGH (ref 8–23)
CO2: 27 mmol/L (ref 22–32)
Calcium: 7.1 mg/dL — ABNORMAL LOW (ref 8.9–10.3)
Chloride: 104 mmol/L (ref 98–111)
Creatinine, Ser: 1.94 mg/dL — ABNORMAL HIGH (ref 0.61–1.24)
GFR, Estimated: 37 mL/min — ABNORMAL LOW (ref >60)
Glucose, Bld: 96 mg/dL (ref 70–99)
Potassium: 3.3 mmol/L — ABNORMAL LOW (ref 3.5–5.1)
Sodium: 140 mmol/L (ref 135–145)

## 2023-09-03 LAB — PHOSPHORUS: Phosphorus: 4.4 mg/dL (ref 2.5–4.6)

## 2023-09-03 LAB — GLUCOSE, CAPILLARY
Glucose-Capillary: 103 mg/dL — ABNORMAL HIGH (ref 70–99)
Glucose-Capillary: 77 mg/dL (ref 70–99)
Glucose-Capillary: 84 mg/dL (ref 70–99)
Glucose-Capillary: 88 mg/dL (ref 70–99)
Glucose-Capillary: 92 mg/dL (ref 70–99)
Glucose-Capillary: 95 mg/dL (ref 70–99)

## 2023-09-03 MED ORDER — ADULT MULTIVITAMIN W/MINERALS CH
1.0000 | ORAL_TABLET | Freq: Every day | ORAL | Status: DC
  Filled 2023-09-03: qty 1

## 2023-09-03 MED ORDER — ORAL CARE MOUTH RINSE: 15.0000 mL | OROMUCOSAL | Status: DC | PRN

## 2023-09-03 MED ORDER — TRAZODONE HCL 50 MG PO TABS: 50.0000 mg | ORAL_TABLET | Freq: Every day | ORAL | Status: DC

## 2023-09-03 MED ORDER — ASPIRIN 81 MG PO TBEC: 81.0000 mg | DELAYED_RELEASE_TABLET | Freq: Every day | ORAL | Status: DC

## 2023-09-03 MED ORDER — LORAZEPAM 1 MG PO TABS
1.0000 mg | ORAL_TABLET | ORAL | Status: AC | PRN
  Administered 2023-09-05: 1 mg via ORAL
  Filled 2023-09-03: qty 1

## 2023-09-03 MED ORDER — POTASSIUM CHLORIDE 10 MEQ/50ML IV SOLN
10.0000 meq | INTRAVENOUS | Status: AC
  Administered 2023-09-03 (×2): 10 meq via INTRAVENOUS
  Filled 2023-09-03 (×2): qty 50

## 2023-09-03 MED ORDER — CLOPIDOGREL BISULFATE 75 MG PO TABS: 75.0000 mg | ORAL_TABLET | Freq: Every day | ORAL | Status: DC

## 2023-09-03 MED ORDER — FUROSEMIDE 10 MG/ML IJ SOLN
60.0000 mg | Freq: Once | INTRAMUSCULAR | Status: AC
  Administered 2023-09-03: 60 mg via INTRAVENOUS
  Filled 2023-09-03: qty 6

## 2023-09-03 MED ORDER — TRAZODONE HCL 50 MG PO TABS
50.0000 mg | ORAL_TABLET | Freq: Every day | ORAL | Status: DC
  Administered 2023-09-03 – 2023-09-07 (×6): 50 mg via ORAL
  Filled 2023-09-03 (×6): qty 1

## 2023-09-03 NOTE — Evaluation (Signed)
Occupational Therapy Evaluation Patient Details Name: Mark Mack MRN: [id number] DOB: Aug 29, 1956 Today's Date: 09/03/2023   History of Present Illness Patient is a 67 year old male presenting with cardiac arrest at home, CPR initiated at home prior to EMS. Found to have inferior STEMI s/p cath lab for [facility]. Patient was intubated and was extubated on 11/5.   Clinical Impression   Pt was seen for OT evaluation this date. Prior to hospital admission, pt was independent in all ALD/IADLs. Pt lives at home with his wife in one level home with 2 steps to enter. Pt was alert and oriented x3, disoriented to date only. Increased time for processing when answering questions and for one step directions. Pt wife and son at bedside throughout session. Pt presents to acute OT demonstrating impaired ADL performance and functional mobility, and delayed processing (See OT problem list for additional functional deficits). Pt completed bed mobility; requiring MOD A + 1 for line management. Pt tolerated 2 STS attempts; MINA +2 for first attempt, MINA + 2 for 2nd attempt, required MOD A + 2 when attempted steps in place.  Pt currently requires MINA for face washing in sitting.  Pt Spo2 >90% throughout session, improved in sitting on EOB. Spo2 86% once with transitioned into supine post mobility, resolved back to 90s once head elevated. Pt on 60L HHFNC throughout. Pt would benefit from skilled OT services to address noted impairments and functional limitations (see below for any additional details) in order to maximize safety and independence while minimizing falls risk and caregiver burden. OT will follow acutely.      If plan is discharge home, recommend the following: A lot of help with walking and/or transfers;Help with stairs or ramp for entrance;Assist for transportation;Direct supervision/assist for medications management;Direct supervision/assist for financial management;A lot of help with  bathing/dressing/bathroom;Assistance with cooking/housework;Assistance with feeding    Functional Status Assessment  Patient has had a recent decline in their functional status and demonstrates the ability to make significant improvements in function in a reasonable and predictable amount of time.  Equipment Recommendations   (Next venue of care)    Recommendations for Other Services       Precautions / Restrictions Precautions Precautions: Fall Precaution Comments: monitor Sp02 Restrictions Weight Bearing Restrictions: No      Mobility Bed Mobility Overal bed mobility: Needs Assistance Bed Mobility: Supine to Sit, Sit to Supine     Supine to sit: Mod assist Sit to supine: Mod assist   General bed mobility comments: Pt requires physical support with trunk and BLE support. Increased time required    Transfers   Equipment used: 2 person hand held assist Transfers: Sit to/from Stand Sit to Stand: Min assist, +2 physical assistance           General transfer comment: Frequent verbal cues for technique throughout t/f      Balance Overall balance assessment: Needs assistance Sitting-balance support: Feet supported, No upper extremity supported Sitting balance-Leahy Scale: Fair     Standing balance support: Bilateral upper extremity supported Standing balance-Leahy Scale: Poor                 High Level Balance Comments: When taking marching in place; pt required MOD A + 2 for stability and balance           ADL either performed or assessed with clinical judgement   ADL Overall ADL's : Needs assistance/impaired     Grooming: Wash/dry face;Wash/dry hands;Minimal assistance;Sitting (using oral suction) Grooming Details (  indicate cue type and reason): vcs for completion             Lower Body Dressing: Maximal assistance;Bed level Lower Body Dressing Details (indicate cue type and reason): socks                     Vision Baseline  Vision/History: 0 No visual deficits Vision Assessment?: Yes Ocular Range of Motion: Within Functional Limits Alignment/Gaze Preference: Within Defined Limits Tracking/Visual Pursuits: Other (comment) (Delayed tracking) Convergence: Within functional limits Visual Fields: No apparent deficits     Perception Perception: Within Functional Limits       Praxis Praxis: WFL       Pertinent Vitals/Pain Pain Assessment Pain Assessment: No/denies pain     Extremity/Trunk Assessment Upper Extremity Assessment Upper Extremity Assessment: Generalized weakness   Lower Extremity Assessment Lower Extremity Assessment: Generalized weakness   Cervical / Trunk Assessment Cervical / Trunk Assessment: Normal   Communication Communication Communication: Difficulty following commands/understanding Following commands: Follows one step commands with increased time Cueing Techniques: Verbal cues;Tactile cues   Cognition Arousal: Alert Behavior During Therapy: WFL for tasks assessed/performed Overall Cognitive Status: Impaired/Different from baseline Area of Impairment: Orientation, Memory, Following commands                 Orientation Level: Disoriented to, Time   Memory: Decreased short-term memory Following Commands: Follows one step commands with increased time       General Comments: Alert and oriented x3, incorrect month and day of week. Pt eager to participate in session, motivated to get better     General Comments  Lines and leads are intact pre/post session    Exercises Other Exercises Other Exercises: Edu pt/family: Role of OT and rehab, Safe ADL completion, vcs throughout for t/f and bed mobility technique   Shoulder Instructions      Home Living Family/patient expects to be discharged to:: Private residence Living Arrangements: Spouse/significant other Available Help at Discharge: Family;Available 24 hours/day Type of Home: House Home Access: Stairs to  enter [employer] of Steps: 2   Home Layout: One level                          Prior Functioning/Environment Prior Level of Function : Independent/Modified Independent             Mobility Comments: retired, independent and active (was working on a car recently) ADLs Comments: independent        OT Problem List: Decreased strength;Decreased cognition;Decreased activity tolerance;Decreased safety awareness;Decreased knowledge of use of DME or AE;Impaired balance (sitting and/or standing)      OT Treatment/Interventions: Self-care/ADL training;Therapeutic exercise;Therapeutic activities;Cognitive remediation/compensation;Energy conservation;DME and/or AE instruction;Balance training;Patient/family education    OT Goals(Current goals can be found in the care plan section) Acute Rehab OT Goals Patient Stated Goal: to get stronger OT Goal Formulation: With patient/family Time For Goal Achievement: 09/17/23 Potential to Achieve Goals: Good ADL Goals Pt Will Perform Grooming: with modified independence;standing Pt Will Perform Lower Body Dressing: sit to/from stand;with modified independence Pt Will Transfer to Toilet: ambulating;with modified independence Pt Will Perform Toileting - Clothing Manipulation and hygiene: with modified independence;sitting/lateral leans  OT Frequency: Min 1X/week    Co-evaluation PT/OT/SLP Co-Evaluation/Treatment: Yes Reason for Co-Treatment: Complexity of the patient's impairments (multi-system involvement);To address functional/ADL transfers   OT goals addressed during session: ADL's and self-care;Strengthening/ROM      AM-PAC OT "6 Clicks" Daily Activity  Outcome Measure Help from another person eating meals?: A Little Help from another person taking care of personal grooming?: A Little Help from another person toileting, which includes using toliet, bedpan, or urinal?: A Lot Help from another person bathing  (including washing, rinsing, drying)?: A Lot Help from another person to put on and taking off regular upper body clothing?: A Lot Help from another person to put on and taking off regular lower body clothing?: A Lot 6 Click Score: 14   End of Session Equipment Utilized During Treatment: Oxygen Nurse Communication: Mobility status  Activity Tolerance: Patient tolerated treatment well Patient left: in chair;with call bell/phone within reach;with bed alarm set;with family/visitor present;with nursing/sitter in room  OT Visit Diagnosis: Other abnormalities of gait and mobility (R26.89);Muscle weakness (generalized) (M62.81)                Time: 1914-7829 OT Time Calculation (min): 26 min Charges:  OT General Charges $OT Visit: 1 Visit OT Evaluation $OT Eval High Complexity: 1 High  [employer], OTS

## 2023-09-03 NOTE — Evaluation (Signed)
Physical Therapy Evaluation Patient Details Name: Mark Mack MRN: [id number] DOB: Dec 14, 1955 Today's Date: 09/03/2023  History of Present Illness  Patient is a 67 year old male presenting with cardiac arrest at home, CPR initiated at home prior to EMS. Found to have inferior STEMI s/p cath lab for [facility]. Patient was intubated and was extubated on 11/5.  Clinical Impression  Patient is agreeable to PT evaluation. Supportive family at the bedside. Patient is activate and independent without assistive device for ambulation at baseline.  Today, the patient is on 60L HHFNC. Sp02 remained 90-100% for most of session except for brief period down to 86% with laying flat for repositioning. Assistance is required for bed mobility and transfers. Standing tolerance of less than 2 minutes. Patient is able to march in place with +2 person assistance while maintaining Sp02 in the 90's. The patient could benefit form continued PT to maximize independence and facilitate return to prior level of function. He is hopeful to return home with family. At this time consider rehabilitation >3 hours/day after this hospital stay.       If plan is discharge home, recommend the following: A lot of help with walking and/or transfers;A lot of help with bathing/dressing/bathroom;Assist for transportation;Help with stairs or ramp for entrance;Assistance with cooking/housework   Can travel by private vehicle        Equipment Recommendations Other (comment) (to be determined)  Recommendations for Other Services  Rehab consult    Functional Status Assessment Patient has had a recent decline in their functional status and demonstrates the ability to make significant improvements in function in a reasonable and predictable amount of time.     Precautions / Restrictions Precautions Precautions: Fall Precaution Comments: monitor Sp02 Restrictions Weight Bearing Restrictions: No      Mobility  Bed Mobility Overal bed  mobility: Needs Assistance Bed Mobility: Supine to Sit, Sit to Supine     Supine to sit: Mod assist Sit to supine: Mod assist   General bed mobility comments: assistance with trunk and BLE support. increased time required, cues for technique    Transfers Overall transfer level: Needs assistance Equipment used: 2 person hand held assist Transfers: Sit to/from Stand Sit to Stand: Min assist, +2 physical assistance           General transfer comment: verbal cues for technique    Ambulation/Gait             Pre-gait activities: Mod A +2 person for weight shifting and marching in place. Standing activity tolerance limited for progression of ambulation    Stairs            Wheelchair Mobility     Tilt Bed    Modified Rankin (Stroke Patients Only)       Balance Overall balance assessment: Needs assistance Sitting-balance support: Feet supported Sitting balance-Leahy Scale: Fair     Standing balance support: Bilateral upper extremity supported Standing balance-Leahy Scale: Poor Standing balance comment: external support required                             Pertinent Vitals/Pain Pain Assessment Pain Assessment: No/denies pain    Home Living Family/patient expects to be discharged to:: Private residence Living Arrangements: Spouse/significant other Available Help at Discharge: Family;Available 24 hours/day Type of Home: House Home Access: Stairs to enter   [employer] of Steps: 2   Home Layout: One level        Prior  Function Prior Level of Function : Independent/Modified Independent             Mobility Comments: retired, independent and active (was working on a car recently) ADLs Comments: independent     Extremity/Trunk Assessment   Upper Extremity Assessment Upper Extremity Assessment: Generalized weakness    Lower Extremity Assessment Lower Extremity Assessment: Generalized weakness    Cervical /  Trunk Assessment Cervical / Trunk Assessment: Normal  Communication   Communication Communication: Difficulty following commands/understanding Following commands: Follows one step commands with increased time Cueing Techniques: Verbal cues;Tactile cues  Cognition Arousal: Alert Behavior During Therapy: WFL for tasks assessed/performed Overall Cognitive Status: Impaired/Different from baseline Area of Impairment: Orientation, Memory, Following commands                 Orientation Level: Disoriented to, Time   Memory: Decreased short-term memory Following Commands: Follows one step commands with increased time       General Comments: mild delay with command following. interactive and eager to participate        General Comments General comments (skin integrity, edema, etc.): Lines and leads are intact pre/post session    Exercises     Assessment/Plan    PT Assessment Patient needs continued PT services  PT Problem List Cardiopulmonary status limiting activity;Decreased strength;Decreased activity tolerance;Decreased balance;Decreased mobility;Decreased safety awareness       PT Treatment Interventions DME instruction;Stair training;Gait training;Functional mobility training;Therapeutic exercise;Therapeutic activities;Balance training;Neuromuscular re-education;Patient/family education;Cognitive remediation    PT Goals (Current goals can be found in the Care Plan section)  Acute Rehab PT Goals Patient Stated Goal: to go home PT Goal Formulation: With patient/family Time For Goal Achievement: 09/17/23 Potential to Achieve Goals: Good    Frequency Min 1X/week     Co-evaluation PT/OT/SLP Co-Evaluation/Treatment: Yes Reason for Co-Treatment: Complexity of the patient's impairments (multi-system involvement);To address functional/ADL transfers PT goals addressed during session: Mobility/safety with mobility OT goals addressed during session: ADL's and  self-care;Strengthening/ROM       AM-PAC PT "6 Clicks" Mobility  Outcome Measure Help needed turning from your back to your side while in a flat bed without using bedrails?: A Lot Help needed moving from lying on your back to sitting on the side of a flat bed without using bedrails?: A Lot Help needed moving to and from a bed to a chair (including a wheelchair)?: A Lot Help needed standing up from a chair using your arms (e.g., wheelchair or bedside chair)?: A Lot Help needed to walk in hospital room?: A Lot Help needed climbing 3-5 steps with a railing? : Total 6 Click Score: 11    End of Session   Activity Tolerance: Patient tolerated treatment well Patient left: in bed;with call bell/phone within reach;with SCD's reapplied;with family/visitor present Nurse Communication: Mobility status PT Visit Diagnosis: Muscle weakness (generalized) (M62.81);Unsteadiness on feet (R26.81)    Time: 1610-9604 PT Time Calculation (min) (ACUTE ONLY): 25 min   Charges:   PT Evaluation $PT Eval High Complexity: 1 High PT Treatments $Therapeutic Exercise: 8-22 mins PT General Charges $$ ACUTE PT VISIT: 1 Visit         Donna Bernard, PT, MPT   Ina Homes 09/03/2023, 12:26 PM

## 2023-09-03 NOTE — Progress Notes (Signed)
NEUROLOGY CONSULT FOLLOW UP NOTE   Date of service: September 03, 2023 Patient Name: Mark Mack MRN:  [id number] DOB:  Nov 20, 1955  Brief HPI  Mark Mack is a 67 y.o. male who presented with cardiac arrest on 11/3.  There was initially concern for neurological compromise and therefore neurology was consulted yesterday.  He appears to be waking up, there was some concern about not moving his left leg yesterday as well.   Interval Hx/subjective   Much improved  Vitals   Vitals:   09/03/23 1300 09/03/23 1316 09/03/23 1400 09/03/23 1500  BP:  137/70 (!) 153/82 (!) 148/78  Pulse:   87   Resp:  (!) 29 (!) 34 (!) 35  Temp:      TempSrc:      SpO2: 92%  91% 94%  Weight:      Height:         Body mass index is 25.69 kg/m.  Physical Exam   General: In bed, intubated  Neurologic Examination   MS: Awakens to minor stimulation, opens eyes, fixates and tracks, follows commands though not reliably CN: Pupils are equal round and reactive, extraocular movements are intact, face is symmetric. Motor: He withdraws to noxious stimulation in all four extremities, will not hold either leg aloft.  He wiggles toes on both feet to command Sensory: As above  Labs and Diagnostic Imaging   CBC:  Recent Labs  Lab 08/31/23 0952 08/31/23 1200 08/31/23 1719 09/01/23 0307 09/02/23 0429 09/03/23 0425  WBC 8.9  --  14.8*   < > 18.2* 19.9*  NEUTROABS 2.7  --  11.8*  --   --   --   HGB 13.0   < > 12.0*   < > 10.5* 10.0*  HCT 43.6   < > 37.4*   < > 30.6* 30.4*  MCV 93.6  --  88.8   < > 83.2 84.0  PLT 157  --  168   < > 95* 90*   < > = values in this interval not displayed.    Basic Metabolic Panel:  Lab Results  Component Value Date   NA 140 09/03/2023   K 3.3 (L) 09/03/2023   CO2 27 09/03/2023   GLUCOSE 96 09/03/2023   BUN 36 (H) 09/03/2023   CREATININE 1.94 (H) 09/03/2023   CALCIUM 7.1 (L) 09/03/2023   GFRNONAA 37 (L) 09/03/2023   GFRAA >60 06/03/2020   Lipid Panel:  Lab Results   Component Value Date   LDLCALC 57 08/31/2023   HgbA1c:  Lab Results  Component Value Date   HGBA1C 5.8 (H) 08/31/2023   Urine Drug Screen:     Component Value Date/Time   LABOPIA NONE DETECTED 08/31/2023 2211   COCAINSCRNUR POSITIVE (A) 08/31/2023 2211   LABBENZ POSITIVE (A) 08/31/2023 2211   AMPHETMU NONE DETECTED 08/31/2023 2211   THCU POSITIVE (A) 08/31/2023 2211   LABBARB NONE DETECTED 08/31/2023 2211    Alcohol Level     Component Value Date/Time   ETH 82 (H) 06/03/2020 0807   INR  Lab Results  Component Value Date   INR 1.2 09/01/2023   APTT  Lab Results  Component Value Date   APTT 67 (H) 08/31/2023    Impression   Mark Mack is a 67 y.o. 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  He did complain of some vision difficulties after extubation yesterday and therefore MRI has been ordered.  With improvement and thus, possible that he had some mild retinal  injury, or possible that he had some mild hypoperfusion injury of his occipital region.  Either way, his improvement is reassuring.  Recommendations  MRI brain   Thank you for the opportunity to take part in the care of this patient. If you have any further questions, please contact the neurology consultation team on call. Updated oncall schedule is listed on AMION.  Signed,  Ritta Slot, MD [facility] [phone]  If 7pm- 7am, please page neurology on call as listed in AMION.

## 2023-09-03 NOTE — Evaluation (Signed)
Clinical/Bedside Swallow Evaluation Patient Details  Name: Mark Mack MRN: [id number] Date of Birth: 12/05/1955  Today's Date: 09/03/2023 Time: SLP Start Time (ACUTE ONLY): 0945 SLP Stop Time (ACUTE ONLY): 1045 SLP Time Calculation (min) (ACUTE ONLY): 60 min  Past Medical History:  Past Medical History:  Diagnosis Date   Hypertension    Past Surgical History:  Past Surgical History:  Procedure Laterality Date   CARDIAC SURGERY     HPI:  Patient is a 67 year old male presenting with cardiac arrest at home, CPR initiated at home prior to EMS. Found to have inferior STEMI s/p cath lab for [facility]. Patient was intubated ~2.5 days and was extubated on 09/02/2023.  Patient had a witnessed cardiac arrest at home and EMS was activated. CPR initiated at home prior to EMS, which continued, with ROSC achieved. He had multiple rounds of CPR secondary to continued loss of pulses.  CXR: Persistent but improving bilateral airspace disease, consistent  with improving edema or pneumonia.  Head CT: No acute intracranial abnormality, no CT evidence of anoxic injury.  Advanced cerebral white matter changes.    Assessment / Plan / Recommendation  Clinical Impression   Pt seen for BSE today and determination of ability to initiate oral diet. Pt awake, verbal but w/ increased WOB w/ any exertion including talking. Responded to basic questions re: self, environment. Noted increased RR in mid20s-30 w/ any exertion. Pt is currently on HFNC 50L, FIO2 60%. RR remained b/t 18-31 during this session. Noted mildly increased abdominal/sternum breathing; mouth breathing at rest.  Afebrile. WBC elevated.   Pt presents w/ HIGH risk for aspiration in setting of declined Pulmonary status currently. Pt requires support of HFNC w/ increased O2 needs. Frequent REST BREAKS required during any/all tasks including talking and minimal po intake in order to avoid decompensation of breathing.     Pt presents w/ functional oropharyngeal  phase swallowing but w/ increased risk for pharyngeal phase dysphagia and aspiration secondary to significant impact from his current Pulmonary status/decline; his need for increased O2 support(50L) HFNC. He required frequent REST BREAKS w/ the exertion from po tasks to ease respiratory demand. ANY such significant Pulmonary decline can impact Apnea timing and airway closure during the swallow, which can impact pharyngeal swallowing, airway protection, and increase risk for aspiration to occur thus further Pulmonary impact/decline.    During this assessment, trials of ice chips and purees(applesauce) ~9 each were presented w/ STRICT REST BREAKS b/t trials and careful monitoring of pt's RR(low20s-30) fluctuation. No immediate overt clinical s/s of aspiration were noted during/post trials: no coughing, no decline in vocal quality, laryngeal excursion appeared WFL. Respiratory effort appeared to increase w/ the effort from the exertion of the po tasks/swallowing, but then calmed again to return to his baseline RR (w/ REST BREAKS b/t trials). Swallowing of trials appeared timely; oral phase appeared grossly [facility] for timely bolus management and A-P transfer w/ consistencies given. Oral clearing appropriate.  No thin liquids nor solid foods assessed d/t current Pulmonary presentation and need for increased O2 support/settings. NSG reported pt is "weaning some today".   In setting of pt's declined Pulmonary status and HIGH risk for aspiration from demand of an oral diet, recommend continue NPO status w/ frequent oral care. Recommend therapeutic and pleasure Small TSP trials of a Pureed (Applesauce only) and Single ice chips to engage oropharyngeal swallowing post oral care prior to any oral intake AND the 2 consistencies CANNOT be given together. 100% NSG Supervision w/ any oral  intake. For conservation of energy and safety during swallowing, STRICT aspiration precautions w/ frequent REST BREAKS during oral intake w/  close monitoring of RR and increased mouth/abdominal breathing = STOP po's if RR increases/sustains above 25-30, or hold on po's if ANY s/s of aspiration noted; STOP feeding. Frequent oral care for hygiene and stimulation of swallowing.   For consideration of an oral diet: would ideally want to see O2 L demand at 15L or less; RR maintained b/t 18-22 at rest w/ only a modest increase w/ any exertion during oral intake. ST services will continue to monitor pt's status and medical improvement next 1-2 days w/ trials to transition to an oral diet as Pulmonary status improves. ST can be available for further education and any needs during this time. Will monitor pt's status for readiness for trials to an diet along w/ discussion w/ MD/Team. Recommend f/u w/ Dietician w/ NGT TFs if indicated d/t other medical issues. Recommend discussion w/ Pulmonologist on the impact of declined respiratory status/breathing on oral intake/swallowing in general. MD/NSG updated. Precautions discussed w/ NSG and posted in chart. Family and pt agreed w/ POC. SLP Visit Diagnosis: Dysphagia, unspecified (R13.10) (declined Pulmonary status)    Aspiration Risk  Risk for inadequate nutrition/hydration;Mild aspiration risk;Moderate aspiration risk (in setting of respiratory status)    Diet Recommendation   NPO (w/ therapeutic trials w/ NSG supervision, oral care)  Medication Administration: Crushed with puree    Other  Recommendations Recommended Consults:  (Dietician following) Oral Care Recommendations: Oral care BID;Oral care prior to ice chip/H20;Staff/trained caregiver to provide oral care (pt support)    Recommendations for follow up therapy are one component of a multi-disciplinary discharge planning process, led by the attending physician.  Recommendations may be updated based on patient status, additional functional criteria and insurance authorization.  Follow up Recommendations Follow physician's recommendations for  discharge plan and follow up therapies      Assistance Recommended at Discharge  FULL  Functional Status Assessment Patient has had a recent decline in their functional status and demonstrates the ability to make significant improvements in function in a reasonable and predictable amount of time.  Frequency and Duration min 2x/week  2 weeks       Prognosis Prognosis for improved oropharyngeal function: Fair (-Good) Barriers to Reach Goals: Time post onset;Severity of deficits Barriers/Prognosis Comment: min increased WOB w/ any exertion - rest breaks needed frequently; Pulmonary decline      Swallow Study   General Date of Onset: 08/31/23 HPI: Patient is a 67 year old male presenting with cardiac arrest at home, CPR initiated at home prior to EMS. Found to have inferior STEMI s/p cath lab for [facility]. Patient was intubated ~2.5 days and was extubated on 09/02/2023.  Patient had a witnessed cardiac arrest at home and EMS was activated. CPR initiated at home prior to EMS, which continued, with ROSC achieved. He had multiple rounds of CPR secondary to continued loss of pulses.  CXR: Persistent but improving bilateral airspace disease, consistent  with improving edema or pneumonia.  Head CT: No acute intracranial abnormality, no CT evidence of anoxic injury.  Advanced cerebral white matter changes. Type of Study: Bedside Swallow Evaluation Previous Swallow Assessment: none Diet Prior to this Study: NPO Temperature Spikes Noted: No (wbc 19.9) Respiratory Status: Nasal cannula;Increased respiratory rate (HFNC 50L; FiO2 60%) History of Recent Intubation: Yes Total duration of intubation (days): 2 days Date extubated: 09/02/23 Behavior/Cognition: Alert;Cooperative;Pleasant mood;Distractible;Requires cueing Oral Cavity Assessment: Dried secretions;Dry (blood residue  from coughing up phlegm/blood) Oral Care Completed by SLP: Yes Oral Cavity - Dentition: Adequate natural dentition Vision: Functional  for self-feeding Self-Feeding Abilities: Needs assist;Needs set up;Total assist (overall weak) Patient Positioning: Upright in bed (needed full positioning support) Baseline Vocal Quality: Low vocal intensity (functional) Volitional Cough: Weak (min) Volitional Swallow: Able to elicit    Oral/Motor/Sensory Function Overall Oral Motor/Sensory Function: Within functional limits   Ice Chips Ice chips: Within functional limits Presentation: Spoon (fed; 9 trials) Other Comments: min increased WOB w/ any exertion - rest breaks taken in b/t trials   Thin Liquid Thin Liquid: Not tested    Nectar Thick Nectar Thick Liquid: Not tested   Honey Thick Honey Thick Liquid: Not tested   Puree Puree: Within functional limits Presentation: Spoon (fed; 9 trials) Other Comments: min increased WOB w/ any exertion - rest breaks taken in b/t trials   Solid     Solid: Not tested         Jerilynn Som, MS, CCC-SLP Speech Language Pathologist Rehab Services; [facility] -  [phone] (ascom) Webber Michiels 09/03/2023,4:12 PM

## 2023-09-03 NOTE — Progress Notes (Signed)
[facility] CLINIC CARDIOLOGY PROGRESS NOTE   Patient ID: Mark Mack MRN: [id number] DOB/AGE: [birth year] 67 y.o.  Admit date: 08/31/2023 Primary Physician Hillery Hunter, Graylin Shiver, MD  Primary Cardiologist Dr. Hessie Dibble [facility] - last seen 2019)  HPI: Mark Mack is a 67 y.o. male with a past medical history of CAD s/p CABG x 4 2019, hypertension, hyperlipidemia who was brought to the ED on 08/31/2023 for witnessed cardiac arrest. When ROSC was obtained there was concern for inferolateral STEMI and patient was taken emergently to the cath lab.   Interval History:  -Patient aware and alert this AM. Extubated yesterday evening.  -Off all vasopressors, BP stable.  -HR remains controlled in NSR.   Review of systems complete and found to be negative unless listed above    Vitals:   09/03/23 0600 09/03/23 0700 09/03/23 0800 09/03/23 0900  BP: 129/81 130/70 (!) 143/71 (!) 152/83  Pulse:  87 97   Resp: 19 (!) 25 (!) 44 (!) 21  Temp:   98.2 F (36.8 C)   TempSrc:   Oral   SpO2:  (!) 86% (!) 88%   Weight:      Height:         Intake/Output Summary (Last 24 hours) at 09/03/2023 1056 Last data filed at 09/03/2023 8657 Gross per 24 hour  Intake 1448.9 ml  Output 2360 ml  Net -911.1 ml     PHYSICAL EXAM General: Ill appearing, well-nourished, no acute distress. Sitting upright in hospital bed with family present at bedside  HEENT: Normocephalic and atraumatic. Neck: No JVD.  Lungs: Diminished bilaterally. Normal effort on HFNC. Heart: HRRR. Normal S1 and S2 without gallops or murmurs. Radial & DP pulses 2+ bilaterally. Abdomen: Non-distended appearing.  Msk: Normal strength and tone for age. Extremities: No clubbing, cyanosis or edema.     LABS: Basic Metabolic Panel: Recent Labs    09/02/23 0429 09/03/23 0425  NA 134* 140  K 4.5 3.3*  CL 97* 104  CO2 26 27  GLUCOSE 109* 96  BUN 34* 36*  CREATININE 2.10* 1.94*  CALCIUM 6.5* 7.1*  MG 1.8 2.6*  PHOS 3.4 4.4   Liver Function  Tests: Recent Labs    08/31/23 1719 09/01/23 0821  AST 567* 419*  ALT 168* 161*  ALKPHOS 122 93  BILITOT 0.7 0.8  PROT 4.5* 4.9*  ALBUMIN 2.1* 2.5*   Recent Labs    09/01/23 0307 09/01/23 0821 09/02/23 0429  LIPASE 308*  --  149*  AMYLASE  --  707* 739*   CBC: Recent Labs    08/31/23 1719 09/01/23 0307 09/02/23 0429 09/03/23 0425  WBC 14.8*   < > 18.2* 19.9*  NEUTROABS 11.8*  --   --   --   HGB 12.0*   < > 10.5* 10.0*  HCT 37.4*   < > 30.6* 30.4*  MCV 88.8   < > 83.2 84.0  PLT 168   < > 95* 90*   < > = values in this interval not displayed.   Cardiac Enzymes: Recent Labs    09/01/23 1748 09/01/23 2304 09/02/23 0429  CKTOTAL  --  6,316* 6,317*  TROPONINIHS 14,097* 16,113* 14,868*   BNP: No results for input(s): "BNP" in the last 72 hours. D-Dimer: No results for input(s): "DDIMER" in the last 72 hours. Hemoglobin A1C: No results for input(s): "HGBA1C" in the last 72 hours.  Fasting Lipid Panel: Recent Labs    09/01/23 0307  TRIG 72   Thyroid Function Tests: No results  for input(s): "TSH", "T4TOTAL", "T3FREE", "THYROIDAB" in the last 72 hours.  Invalid input(s): "FREET3" Anemia Panel: No results for input(s): "VITAMINB12", "FOLATE", "FERRITIN", "TIBC", "IRON", "RETICCTPCT" in the last 72 hours.  DG Chest Port 1 View  Result Date: 09/02/2023 CLINICAL DATA:  Short of breath EXAM: PORTABLE CHEST 1 VIEW COMPARISON:  08/31/2023 FINDINGS: Single frontal view of the chest demonstrates right internal jugular catheter tip overlying superior vena cava. Cardiac silhouette is stable. There are persistent but improving bilateral ground-glass opacities throughout the lungs. No effusion or pneumothorax. No acute bony abnormalities. IMPRESSION: 1. Persistent but improving bilateral airspace disease, consistent with improving pneumonia or edema. Electronically Signed   By: Sharlet Salina M.D.   On: 09/02/2023 23:50   CT ABDOMEN PELVIS WO CONTRAST  Result Date:  09/02/2023 CLINICAL DATA:  Acute pancreatitis EXAM: CT CHEST, ABDOMEN AND PELVIS WITHOUT CONTRAST TECHNIQUE: Multidetector CT imaging of the chest, abdomen and pelvis was performed following the standard protocol without IV contrast. RADIATION DOSE REDUCTION: This exam was performed according to the departmental dose-optimization program which includes automated exposure control, adjustment of the mA and/or kV according to patient size and/or use of iterative reconstruction technique. COMPARISON:  CTA runoff dated April 03, 2018 FINDINGS: CT CHEST FINDINGS Cardiovascular: Mild cardiomegaly. Small amount of air seen in the right ventricle, likely injection related. 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 Normal caliber thoracic aorta with moderate calcified plaque. Central venous line with tip in the right atrium. Mediastinum/Nodes: Esophagus and thyroid unremarkable. No enlarged lymph nodes seen in the chest. Lungs/Pleura: Central airways are patent. Slightly decreased lower lobe predominant consolidations and upper lobe predominant ground-glass opacities. No pleural effusion or pneumothorax. Musculoskeletal: Prior median sternotomy. No aggressive appearing osseous lesions. CT ABDOMEN PELVIS FINDINGS Hepatobiliary: No focal liver abnormality is seen. High attenuation material seen in the gallbladder, likely vicarious excretion of contrast. Possible mild gallbladder wall thickening. No evidence of biliary ductal dilation. Pancreas: Persistent peripancreatic stranding which is most prominent about the pancreatic head with no areas of hyperattenuation. Stranding tracks along the right-greater-than-left pericolic gutters. Spleen: Normal in size without focal abnormality. Adrenals/Urinary Tract: Bilateral adrenal glands are unremarkable. Persistent enhancement of the bilateral kidneys. Mild bladder wall thickening. Stomach/Bowel: Stomach is within normal limits. No evidence of wall thickening or  obstruction. Vascular/Lymphatic: Aortic atherosclerosis. No enlarged abdominal or pelvic lymph nodes. Reproductive: Prostate is not well visualized due to streak artifact. Other: No intra-abdominal free air.  Trace abdominopelvic ascites. Musculoskeletal: Prior bilateral total hip arthroplasties. Lytic and sclerotic lesion of L4, unchanged when compared with 2019 prior. IMPRESSION: 1. Persistent peripancreatic stranding which is most prominent about the pancreatic head, consistent with acute pancreatitis. No evidence of hemorrhage on today's exam. Previously described hyperattenuation not present on today's exam and was likely a perfusional abnormality secondary to hypoperfusion injury. 2. Slightly decreased lower lobe predominant consolidations and upper lobe predominant ground-glass opacities, likely improving aspiration pneumonia. 3. Persistent enhancement of the bilateral kidneys, findings can be seen in the setting of acute tubular necrosis. 4. Trace abdominopelvic ascites. 5. Aortic Atherosclerosis (ICD10-I70.0). Electronically Signed   By: Allegra Lai M.D.   On: 09/02/2023 12:58   CT CHEST WO CONTRAST  Result Date: 09/02/2023 CLINICAL DATA:  Acute pancreatitis EXAM: CT CHEST, ABDOMEN AND PELVIS WITHOUT CONTRAST TECHNIQUE: Multidetector CT imaging of the chest, abdomen and pelvis was performed following the standard protocol without IV contrast. RADIATION DOSE REDUCTION: This exam was performed according to the departmental dose-optimization program which includes automated exposure control, adjustment  of the mA and/or kV according to patient size and/or use of iterative reconstruction technique. COMPARISON:  CTA runoff dated April 03, 2018 FINDINGS: CT CHEST FINDINGS Cardiovascular: Mild cardiomegaly. Small amount of air seen in the right ventricle, likely injection related. 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 Normal caliber thoracic aorta with moderate calcified plaque. Central  venous line with tip in the right atrium. Mediastinum/Nodes: Esophagus and thyroid unremarkable. No enlarged lymph nodes seen in the chest. Lungs/Pleura: Central airways are patent. Slightly decreased lower lobe predominant consolidations and upper lobe predominant ground-glass opacities. No pleural effusion or pneumothorax. Musculoskeletal: Prior median sternotomy. No aggressive appearing osseous lesions. CT ABDOMEN PELVIS FINDINGS Hepatobiliary: No focal liver abnormality is seen. High attenuation material seen in the gallbladder, likely vicarious excretion of contrast. Possible mild gallbladder wall thickening. No evidence of biliary ductal dilation. Pancreas: Persistent peripancreatic stranding which is most prominent about the pancreatic head with no areas of hyperattenuation. Stranding tracks along the right-greater-than-left pericolic gutters. Spleen: Normal in size without focal abnormality. Adrenals/Urinary Tract: Bilateral adrenal glands are unremarkable. Persistent enhancement of the bilateral kidneys. Mild bladder wall thickening. Stomach/Bowel: Stomach is within normal limits. No evidence of wall thickening or obstruction. Vascular/Lymphatic: Aortic atherosclerosis. No enlarged abdominal or pelvic lymph nodes. Reproductive: Prostate is not well visualized due to streak artifact. Other: No intra-abdominal free air.  Trace abdominopelvic ascites. Musculoskeletal: Prior bilateral total hip arthroplasties. Lytic and sclerotic lesion of L4, unchanged when compared with 2019 prior. IMPRESSION: 1. Persistent peripancreatic stranding which is most prominent about the pancreatic head, consistent with acute pancreatitis. No evidence of hemorrhage on today's exam. Previously described hyperattenuation not present on today's exam and was likely a perfusional abnormality secondary to hypoperfusion injury. 2. Slightly decreased lower lobe predominant consolidations and upper lobe predominant ground-glass opacities,  likely improving aspiration pneumonia. 3. Persistent enhancement of the bilateral kidneys, findings can be seen in the setting of acute tubular necrosis. 4. Trace abdominopelvic ascites. 5. Aortic Atherosclerosis (ICD10-I70.0). Electronically Signed   By: Allegra Lai M.D.   On: 09/02/2023 12:58   CT HEAD WO CONTRAST ( )  Result Date: 09/02/2023 CLINICAL DATA:  67 year old male neurologic deficit, altered mental status, cardiac arrest, CPR, intubated. EXAM: CT HEAD WITHOUT CONTRAST TECHNIQUE: Contiguous axial images were obtained from the base of the skull through the vertex without intravenous contrast. RADIATION DOSE REDUCTION: This exam was performed according to the departmental dose-optimization program which includes automated exposure control, adjustment of the mA and/or kV according to patient size and/or use of iterative reconstruction technique. COMPARISON:  Head CT 08/31/2023. FINDINGS: Brain: Stable cerebral volume. No midline shift, mass effect, or evidence of intracranial mass lesion. No ventriculomegaly. Basilar cisterns remain patent, stable. Cerebral sulci remain visible. Gray-white differentiation is visible in both hemispheres. Patchy and confluent white matter hypodensity bilaterally, periatrial regions most affected. No cortically based acute infarct identified. Vascular: Resolved intravascular contrast. No suspicious intracranial vascular hyperdensity. Calcified atherosclerosis at the skull base. Skull: Stable, intact. Sinuses/Orbits: Intubated on the scout view. Mild sphenoid sinus mucosal thickening and bubbly opacity. Other visualized paranasal sinuses and mastoids are stable and well aerated. Other: Visualized orbits and scalp soft tissues are within normal limits. IMPRESSION: No acute intracranial abnormality, no CT evidence of anoxic injury. Advanced cerebral white matter changes. Electronically Signed   By: Odessa Fleming M.D.   On: 09/02/2023 12:46   DG Abd Portable 1V  Result  Date: 09/02/2023 CLINICAL DATA:  Feeding tube placement. EXAM: PORTABLE ABDOMEN - 1 VIEW COMPARISON:  September 01, 2023. FINDINGS: Distal tip of nasogastric tube is seen in expected position of distal stomach. No abnormal bowel dilatation is noted. Contrast is noted in nondilated colon. IMPRESSION: Distal tip of nasogastric tube seen in expected position of distal stomach. Electronically Signed   By: Lupita Raider M.D.   On: 09/02/2023 08:32   EEG adult  Result Date: 09/01/2023 Charlsie Quest, MD     09/01/2023 12:29 PM Patient Name: Mark Mack MRN: [id number] Epilepsy Attending: Charlsie Quest Referring Physician/Provider: Milon Dikes, MD Date: 09/01/2023 Duration: 31.32 mins Patient history: 67 year old male status post cardiac arrest.  EEG to alert for seizure. Level of alertness: comatose AEDs during EEG study: Propofol Technical aspects: This EEG study was done with scalp electrodes positioned according to the 10-20 International system of electrode placement. Electrical activity was reviewed with band pass filter of 1-70Hz , sensitivity of 7 uV/mm, display speed of 66mm/sec with a 60Hz  notched filter applied as appropriate. EEG data were recorded continuously and digitally stored.  Video monitoring was available and reviewed as appropriate. Description: EEG showed continuous generalized polymorphic mixed frequencies with predominantly 5 to 9 Hz theta-alpha activity admixed with intermittent generalized 2 to 3 Hz delta slowing.  Hyperventilation and photic stimulation were not performed.   ABNORMALITY - Continuous slow, generalized IMPRESSION: This study is suggestive of moderate diffuse encephalopathy. No seizures or epileptiform discharges were seen throughout the recording. Priyanka O Yadav     ECHO as above  TELEMETRY reviewed by me 09/03/23: sinus rhythm rate 90s  EKG reviewed by me 09/03/23: sinus rhythm rate 80 bpm  DATA reviewed by me 09/03/23: last 24h vitals tele labs imaging I/O,  PCCM progress note, neurology note  Principal Problem:   Cardiac arrest [facility]) Active Problems:   STEMI (ST elevation myocardial infarction) (HCC)    ASSESSMENT AND PLAN: Mark Mack is a 67 y.o. male with a past medical history of CAD s/p CABG x 4 2019, hypertension, hyperlipidemia who was brought to the ED on 08/31/2023 for witnessed cardiac arrest. When ROSC was obtained there was concern for inferolateral STEMI and patient was taken emergently to the cath lab.   # Cardiac arrest # STEMI # Coronary artery disease s/p CABG x4 2019 Patient with witnessed cardiac arrest, ROSC obtained and EKG concerning for inferolateral STEMI. Patient taken urgently to cath lab 11/3, unsuccessful attempts to cross ostial LCx and prox-mid RCA lesions. PCWP of 25 mmHg. Required multiple vasopressors, now weaned completed. Echo this admission demonstrated preserved EF.  -Will hold asa, plavix given alveolar hemorrhage. Recommend starting once appropriate.  -Recommend addition of statin once LFTs are improved.  -Consider restarting home BP meds as BP improves.   # Acute Hypoxic and Hypercapnic Respiratory Failure # Pulmonary contusion # Bland alveolar hemorrhage Bronch done 11/3 demonstrated alveolar hemorrhage likely 2/2 pulmonary contusion. Extubated yesterday 09/02/23. -Management per PCCM.   #AKI on CKD #Metabolic acidosis Cr 1.24 on admission, 1.94 on AM labs today. -Continue to monitor renal function closely.    This patient's case was discussed and created with Dr. Melton Alar and she is in agreement.  Signed:  [name], PA-C  09/03/2023, 10:56 AM [facility] Cardiology

## 2023-09-03 NOTE — Plan of Care (Signed)
  Problem: Education: Goal: Knowledge of General Education information will improve Description: Including pain rating scale, medication(s)/side effects and non-pharmacologic comfort measures Outcome: Progressing   Problem: Clinical Measurements: Goal: Diagnostic test results will improve Outcome: Progressing Goal: Cardiovascular complication will be avoided Outcome: Progressing   Problem: Activity: Goal: Risk for activity intolerance will decrease Outcome: Progressing   Problem: Coping: Goal: Level of anxiety will decrease Outcome: Progressing   Problem: Safety: Goal: Ability to remain free from injury will improve Outcome: Progressing

## 2023-09-03 NOTE — Progress Notes (Signed)
   09/03/23 1500  Spiritual Encounters  Type of Visit Initial  Care provided to: Pt and family  Referral source Chaplain assessment  Reason for visit Routine spiritual support  OnCall Visit No  Spiritual Framework  Patient Stress Factors Major life changes  Family Stress Factors Major life changes  Interventions  Spiritual Care Interventions Made Established relationship of care and support;Compassionate presence  Intervention Outcomes  Outcomes Connection to spiritual care;Awareness of support  Spiritual Care Plan  Spiritual Care Issues Still Outstanding Chaplain will continue to follow   Saw patient and family in the room the patient was tearful as I walked by the room. Spoke with patient and family let them know that we are here for them. Educated them that Chaplains are here 24/7 and the nurses know how to reach [place].  Will keep eye on patient and family during this time. Patient family says he is during better with diagnosis now then before.

## 2023-09-03 NOTE — Progress Notes (Signed)
PHARMACY CONSULT NOTE - FOLLOW UP  Pharmacy Consult for Electrolyte Monitoring and Replacement   Recent Labs: Potassium (mmol/L)  Date Value  09/03/2023 3.3 (L)   Magnesium (mg/dL)  Date Value  40/98/1191 2.6 (H)   Calcium (mg/dL)  Date Value  47/82/9562 7.1 (L)   Albumin (g/dL)  Date Value  13/05/6577 2.5 (L)   Phosphorus (mg/dL)  Date Value  46/96/2952 4.4   Sodium (mmol/L)  Date Value  09/03/2023 140   Corrected Ca: 8.3 mg/dL  Assessment: 67 y.o. male with a past medical history of CAD s/p CABG x 4 2019, hypertension, hyperlipidemia who was brought to the ED on 08/31/2023 for witnessed cardiac arrest. Pharmacy is asked to follow and replace electrolytes while in CCU.   Goal of Therapy:  Electrolytes WNL  Plan:  K+ 3.3; will order KCl 10 mEq IV x 2  F/u electrolytes tomorrow with AM labs   Littie Deeds, PharmD Pharmacy Resident  09/03/2023 7:04 AM

## 2023-09-03 NOTE — Progress Notes (Signed)
NAME:  Mark Mack, MRN:  [id number], DOB:  [birth year], LOS: 3 ADMISSION DATE:  08/31/2023 CONSULTATION DATE:  09/03/2023 CHIEF COMPLAINT:  Cardiac Arrest    History of Present Illness:  67 year old male presenting to the hospital with cardiac arrest.   Patient had a witnessed cardiac arrest at home and EMS was activated. CPR initiated at home prior to EMS, which continued, with ROSC achieved. Multiple rounds of CPR were completed secondary to continued loss of pulses. EKG was notable for inferior STEMI, and he is taken emergently to the cath lab for [facility] and will be admitted to the ICU afterwards.   In the cath lab, LHC performed showed occlusions in multiple vessels, though wire could not be passed and no revascularization was performed. Patient underwent a RHC with PCWP of 25 mmHg and mixed venous sat showing 33% saturation, with 69% saturation in the arterial circulation. This correlates to a cardiac output of 2.5 and cardiac index of 2.1.   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 He also has a history of HTN and smoking.  Pertinent  Medical History  Hypertension Coronary Artery Disease - S/P SABG (2019, 4 vessel) CHF Chronic Obstructive Pulmonary Disease ETOH  Significant Hospital Events: Including procedures, antibiotic start and stop dates in addition to other pertinent events   11/3: patient mechanically intubation requiring multiple vasopressors and sedation. CVC and arterial line placed given severe hemodynamic instability. Bronchoscopy completed at bedside with bloody secretions present. Epinephrine and tranexamic acid provided during the procedure. Patient attempted to have revascularization completed but was unable to do so. 11/4: Remains critically ill with increasing vasopressor requirements on mechanical ventilation. Remains on stress dose steroids. No longer requiring milrinone. 11/5: Remains critically ill although mechanical ventilation  requirements are decreasing. Patient remains on vasopressors. ABD/Pelvic, Chest and Head CT. 11/6: Extubated on HHFNC. No longer requiring pressors or sedation. A line removed. Remains on Zosyn for aspiration pneumonia.    Micro Data:  11/3: MRSA PCR >> negative 11/4: Blood cultures >>  Antimicrobials:  Zosyn   Interim History / Subjective:  Patient is no longer requiring mechanical ventilation or vasopressors. Patient extubated yesterday to HHFNC, 80% on 60L. Patient did not sleep well last night due to [facility].    Objective   Blood pressure 130/70, pulse 87, temperature 98.3 F (36.8 C), resp. rate (!) 25, height 5\' 6"  (1.676 m), weight 72.2 kg, SpO2 (!) 86%.     Intake/Output Summary (Last 24 hours) at 09/03/2023 0759 Last data filed at 09/03/2023 0648 Gross per 24 hour  Intake 1448.9 ml  Output 3260 ml  Net -1811.1 ml   Filed Weights   08/31/23 1213 09/01/23 0457  Weight: 66.5 kg 72.2 kg    REVIEW OF SYSTEMS  Review of Systems  Constitutional:  Negative for fever.  Eyes:  Negative for blurred vision.  Respiratory:  Positive for sputum production and shortness of breath. Negative for cough.   Cardiovascular:  Negative for chest pain and palpitations.  Gastrointestinal:  Negative for abdominal pain, nausea and vomiting.  Neurological:  Positive for focal weakness. Negative for sensory change and headaches.  Psychiatric/Behavioral:  Positive for memory loss.       PHYSICAL EXAMINATION:  GENERAL: no acute distress on HHFNC EYES: pupils equal, round, non-reactive to light.  No scleral icterus.  MOUTH: Moist mucosal membrane.  NECK: Supple.  PULMONARY: - rhonchi, - wheezing, diminished breath sounds bilaterally CARDIOVASCULAR: S1 and S2. Regular rate and  rhythm GASTROINTESTINAL: Soft, nontender, -distended. Positive bowel sounds.  MUSCULOSKELETAL: No swelling, clubbing, or edema.  NEUROLOGIC: unable to identify reason for hospitalization but alert and oriented to  self, time and place. Full ROM of upper and lower extremities. Decreased strength of LLE. SKIN: normal, warm to touch, delayed capillary refill, pulses present bilaterally 1+    Labs/imaging that I havepersonally reviewed  (right click and "Reselect all SmartList Selections" daily)   ASSESSMENT AND PLAN SYNOPSIS 67 yo male who presented following a cardiac arrest, found to have a STEMI with multi-vessel occlusions, resulting in cardiogenic shock, metabolic  encephalopathy and AKI secondary to metabolic acidosis complicated by pancreatitis due to cocaine abuse. Patient was taken for emergent cath lab intervention but was unable to complete revascularization due to extensive occlusions. Hemodynamic instability requiring multiple vasopressors and mechanical ventilation.  NEURO #Metabolic Encephalopathy-resolving - no longer requiring sedation - 11/5 Repeat Head CT: no evidence of anoxic injury or hemorrhage - 11/4 EEG: diffuse encephalopathy without evidence of seizures - no longer on Keppra - UDS + benzo, cocaine, cannibinoid - Neuro following - Speech, OT & PT following - CIWA protocol - Trazodone prn for insomnia   CARDIOVASCULAR #Cardiac Arrest #Inferior ST Elevation Myocardial Infarction #Coronary Artery Disease - S/P CABG #Cardiogenic Shock secondary to STEMI - continuous ICU monitoring - vasopressors prn to keep MAP goal > 65 - no longer requiring vasopressor support - troponin peaked at 16K - fluid resuscitation as needed - continue zosyn for empiric coverage - follow blood cultures, no growth - 11/4 TTE: EF of 67% - Cardiology following, appreciate input - post cath lab medications per cardiology: plavix, aspirin, HD statin - will hold for now given bloody secretions   PULMONARY #Acute Hypoxic and Hypercapnic Respiratory Failure #Pulmonary Contusion secondary to CPR #Alveolar Hemorrhage - no longer requiring mechanical ventilation - continue bronchodilators as  needed - extubated, currently on HHFNC 90% on 60L - bloody respiratory secretions today, will hold anticoagulation - CXRs as needed  - 11/3 CTA ruled out pulmonary embolism - 11/5 Chest CT: Lower lobe predominant consolidations and upper lobe predominant ground-glass opacities Hold ANTI platelet agents for now  GI #Pancreatitis #Transaminitis - GI prophylaxis, continue protonix - previous CTA showed high attenuation stranding adjacent to the pancreas - 11/5 ABD/Pelvic CT: persistent peripancreatic stranding, no evidence of hemorrhage - trend hepatic function - LFT elevation; AST 285, 567, and ALT 152, 168 - Lipase trends: 308, 127 - Constipation protocol - NPO for now, speech will come evaluate for a swallow study   RENAL/ELECTROLYTES #Acute Kidney Injury secondary to Cardiogenic Shock vs. Metabolic Acidosis #Acute on Chronic Kidney Disease #Hypokalemia - Resolved #Rhabdomyolysis - strict I/O, foley removed this morning - fluid resuscitation as indicated - no longer requiring bicarb - trend renal function - Creatinine 1.90, 2.28, 2.10, 1.94 - BUN 21, 29, 34, 36 - Elevated CK at 6317 - trend BMP - replace electrolytes as needed - Potassium (3.3) and Magnesium (2.6) this am - consider lasix trial today as creatinine is improving - avoid nephrotoxic agents and renal dose medications as needed   ENDO - continue glycemic protocol - CBG - sliding scale insulin, currently using novolog - discontinue hydrocortisone  HEME #Acute Anemia #Thrombocytopenia - DVT prophylaxis, SCDs only for now - Heparin, on hold for now given alveolar hemorrhage - trend CBC - monitor Hemoglobin trends, 12.5, 10.5, 10.0 - consider transfusion if Hemoglobin < 7 - platelets 90 this am, monitor trends   ID #Aspiration Pneumonia - trend WBC  and monitor fever curve - remains afebrile - WBC trends: 15.4, 18.2, 19.9 - continue Zosyn for aspiration pneumonia coverage, may consider stopping -  PCT <0.10    Best practice (right click and "Reselect all SmartList Selections" daily)  Diet: NPO Pain/Anxiety/Delirium protocol (if indicated): No VAP protocol (if indicated): Not indicated DVT prophylaxis: Contraindicated GI prophylaxis: PPI Central venous access:  Yes, and it is still needed Arterial line:  N/A Foley:  N/A Mobility:  bed rest  Code Status:  DNR but may intubate Disposition: ICU  Labs   CBC: Recent Labs  Lab 08/31/23 0952 08/31/23 1200 08/31/23 1719 09/01/23 0307 09/02/23 0429 09/03/23 0425  WBC 8.9  --  14.8* 15.4* 18.2* 19.9*  NEUTROABS 2.7  --  11.8*  --   --   --   HGB 13.0 12.6*  12.2* 12.0* 12.5* 10.5* 10.0*  HCT 43.6 37.0*  36.0* 37.4* 39.8 30.6* 30.4*  MCV 93.6  --  88.8 88.4 83.2 84.0  PLT 157  --  168 143* 95* 90*    Basic Metabolic Panel: Recent Labs  Lab 08/31/23 0952 08/31/23 1200 08/31/23 1719 09/01/23 0307 09/01/23 0821 09/01/23 2304 09/02/23 0429 09/03/23 0425  NA 143   < > 137 139  --  135 134* 140  K 4.2   < > 3.0* 2.7* 3.7 4.8 4.5 3.3*  CL 104  --  109 104  --  99 97* 104  CO2 18*  --  16* 21*  --  27 26 27   GLUCOSE 349*  --  227* 312*  --  101* 109* 96  BUN 15  --  21 29*  --  34* 34* 36*  CREATININE 1.24  --  1.90* 2.28*  --  2.19* 2.10* 1.94*  CALCIUM 10.1  --  6.8* 6.7*  --  6.3* 6.5* 7.1*  MG 2.6*  --   --  1.8  --   --  1.8 2.6*  PHOS 9.5*  --   --  6.0*  --   --  3.4 4.4   < > = values in this interval not displayed.   GFR: Estimated Creatinine Clearance: 33.8 mL/min (A) (by C-G formula based on SCr of 1.94 mg/dL (H)). Recent Labs  Lab 08/31/23 0952 08/31/23 1719 08/31/23 2211 09/01/23 0307 09/01/23 0441 09/01/23 0821 09/01/23 2304 09/02/23 0429 09/03/23 0425  PROCALCITON <0.10  --   --   --   --   --   --   --   --   WBC 8.9 14.8*  --  15.4*  --   --   --  18.2* 19.9*  LATICACIDVEN  --  6.9*   < >  --  8.2* 6.2* 2.9* 2.6*  --    < > = values in this interval not displayed.    Liver Function  Tests: Recent Labs  Lab 08/31/23 0952 08/31/23 1719 09/01/23 0821  AST 285* 567* 419*  ALT 152* 168* 161*  ALKPHOS 134* 122 93  BILITOT 0.7 0.7 0.8  PROT 5.4* 4.5* 4.9*  ALBUMIN 2.5* 2.1* 2.5*   Recent Labs  Lab 08/31/23 0952 09/01/23 0307 09/01/23 0821 09/02/23 0429  LIPASE 35 308*  --  149*  AMYLASE  --   --  707* 739*   No results for input(s): "AMMONIA" in the last 168 hours.  ABG    Component Value Date/Time   PHART 7.46 (H) 09/01/2023 2305   PCO2ART 40 09/01/2023 2305   PO2ART 57 (L) 09/01/2023 2305  HCO3 28.4 (H) 09/01/2023 2305   TCO2 18 (L) 08/31/2023 1200   TCO2 16 (L) 08/31/2023 1200   ACIDBASEDEF 3.6 (H) 09/01/2023 1019   O2SAT 93.5 09/01/2023 2305     Coagulation Profile: Recent Labs  Lab 08/31/23 0952 09/01/23 0821  INR 1.5* 1.2    Cardiac Enzymes: Recent Labs  Lab 09/01/23 2304 09/02/23 0429  CKTOTAL 6,316* 6,317*    HbA1C: Hgb A1c MFr Bld  Date/Time Value Ref Range Status  08/31/2023 09:52 AM 5.8 (H) 4.8 - 5.6 % Final    Comment:    (NOTE) Pre diabetes:          5.7%-6.4%  Diabetes:              >6.4%  Glycemic control for   <7.0% adults with diabetes     CBG: Recent Labs  Lab 09/02/23 1214 09/02/23 1640 09/02/23 2033 09/03/23 0013 09/03/23 0435  GLUCAP 111* 94 84 77 103*      Home Medications  Prior to Admission medications   Medication Sig Start Date End Date Taking? Authorizing Provider  acetaminophen (TYLENOL) 500 MG tablet Take 1,000 mg by mouth every 6 (six) hours as needed.     [provider]  aspirin EC 81 MG tablet Take 81 mg by mouth daily.     [provider]  docusate sodium (COLACE) 100 MG capsule Take 1 capsule (100 mg total) by mouth daily. Patient not taking: Reported on 05/28/2018 04/03/18   Ramonita Lab, MD  esomeprazole (NEXIUM) 20 MG packet Take 20 mg by mouth daily before breakfast.  04/01/18 05/01/18  [provider]  ezetimibe (ZETIA) 10 MG tablet Take 10 mg by mouth  daily. 05/22/18   [provider]  ferrous sulfate 325 (65 FE) MG EC tablet Take 1 tablet (325 mg total) by mouth daily with breakfast. Patient not taking: Reported on 05/28/2018 04/03/18 04/03/19  Ramonita Lab, MD  furosemide (LASIX) 20 MG tablet Take 20 mg by mouth daily. 03/29/18   [provider]  metoprolol succinate (TOPROL XL) 50 MG 24 hr tablet Take 50 mg by mouth daily.  02/13/18 02/13/19  [provider]  ranitidine (ZANTAC) 150 MG tablet  05/26/18   [provider]  rosuvastatin (CRESTOR) 20 MG tablet Take 1 tablet (20 mg total) by mouth at bedtime. Patient not taking: Reported on 05/28/2018 04/03/18 04/03/19  Ramonita Lab, MD  senna-docusate (SENOKOT-S) 8.6-50 MG tablet Take 1 tablet by mouth at bedtime as needed for mild constipation. Patient not taking: Reported on 05/28/2018 04/03/18   Ramonita Lab, MD     DVT/GI PRX  assessed I Assessed the need for Labs I Assessed the need for Foley I Assessed the need for Central Venous Line Family Discussion when available I Assessed the need for Mobilization I made an Assessment of medications to be adjusted accordingly Safety Risk assessment completed  CASE DISCUSSED IN MULTIDISCIPLINARY ROUNDS WITH ICU TEAM  Critical Care Time devoted to patient care services described in this note is 65 minutes.   Critical care was necessary to treat /prevent imminent and life-threatening deterioration.   PATIENT WITH VERY POOR PROGNOSIS I ANTICIPATE PROLONGED ICU LOS  Patient is critically ill. Patient with Multiorgan failure and at high risk for cardiac arrest and death.    Mark Mack, M.D.  [facility]  Medical Director [facility] Medical Director [facility] Cardio-Pulmonary Department

## 2023-09-03 NOTE — Progress Notes (Signed)
  Inpatient Rehab Admissions Coordinator :  Per therapy recommendations patient was screened for CIR candidacy by Ottie Glazier RN MSN. Patient is not yet at a level to tolerate the intensity required to pursue a CIR admit. Noted HHFNC 80% on 60 L. Await further medical and therapy progress to determine rehab venue needs.  The CIR admissions team will follow and monitor for progress and place a Rehab Consult order if felt to be appropriate. Please contact me with any questions.  Ottie Glazier RN MSN Admissions Coordinator [phone]

## 2023-09-03 NOTE — Progress Notes (Signed)
Nutrition Follow Up Note   DOCUMENTATION CODES:   Not applicable  INTERVENTION:   RD will add supplements with diet advancement   Recommend NGT if pt is unable to be placed on an oral diet or if his oral intake is poor.   If tube feeds initiated, recommend:  Vital 1.5@50ml /hr- Initiate at 11ml/hr and increase by 74ml/hr q 8 hours until goal rate is reached.   ProSource TF 20- Give 60ml daily via tube, each supplement provides 80kcal and 20g of protein.   Free water flushes 30ml q4 hours to maintain tube patency   Regimen provides 1880kcal/day, 101g/day protein and 1033ml/day of free water  Pt at high refeed risk; recommend monitor potassium, magnesium and phosphorus labs daily until stable  Daily weights   NUTRITION DIAGNOSIS:   Inadequate oral intake related to inability to eat (pt sedated and ventilated) as evidenced by NPO status. -ongoing   GOAL:   Patient will meet greater than or equal to 90% of their needs -not met   MONITOR:   Diet advancement, Labs, Weight trends, I & O's, Skin  ASSESSMENT:   67 y/o male with h/o CAD status post CABG, COPD, etoh and cocaine abuse, GERD, CKD and HTN who is admitted with STEMI, cardiac arrest, AMS, possible aspiration and suspected pancreatitis.  Pt s/p R heart cath 11/3  Pt extubated yesterday. SLP evaluation is pending. RD will add supplements with diet advancement. Pt is at high refeed risk as pt has remained NPO since admission. Recommend NGT placement and nutrition support if pt is unable to be initiated on an oral diet or if his intake is poor. Per chart, pt is up ~13lbs since admission. Pt +4.0L on his I & Os.   Medications reviewed and include: folic acid, lasix, insulin, MVI, protonix, zosyn  Labs reviewed: K 3.3(L), BUN 36(H), creat 1.94(H), P 4.4 wnl, Mg 2.6(H) Amylase- 739(H), lipase 149(H)- 11/5 wbcs- 19.9(H), Hgb 10.0(L), Hct 30.4(L) Cbgs- 95, 103, 77 x 24hrs   Diet Order:   Diet Order             Diet  NPO time specified  Diet effective now                  EDUCATION NEEDS:   No education needs have been identified at this time  Skin:  Skin Assessment: Reviewed RN Assessment  Last BM:  11/3  Height:   Ht Readings from Last 1 Encounters:  08/31/23 5\' 6"  (1.676 m)    Weight:   Wt Readings from Last 1 Encounters:  09/01/23 72.2 kg    Ideal Body Weight:  64.5 kg  BMI:  Body mass index is 25.69 kg/m.  Estimated Nutritional Needs:   Kcal:  1900-2200kcal/day  Protein:  95-110g/day  Fluid:  1.7-1.9L/day  Betsey Holiday MS, RD, LDN Please refer to [facility] for RD and/or RD on-call/weekend/after hours pager

## 2023-09-04 DIAGNOSIS — I469 Cardiac arrest, cause unspecified: Secondary | ICD-10-CM | POA: Diagnosis not present

## 2023-09-04 LAB — BASIC METABOLIC PANEL
Anion gap: 8 (ref 5–15)
BUN: 38 mg/dL — ABNORMAL HIGH (ref 8–23)
CO2: 28 mmol/L (ref 22–32)
Calcium: 7.6 mg/dL — ABNORMAL LOW (ref 8.9–10.3)
Chloride: 111 mmol/L (ref 98–111)
Creatinine, Ser: 1.95 mg/dL — ABNORMAL HIGH (ref 0.61–1.24)
GFR, Estimated: 37 mL/min — ABNORMAL LOW (ref >60)
Glucose, Bld: 95 mg/dL (ref 70–99)
Potassium: 3 mmol/L — ABNORMAL LOW (ref 3.5–5.1)
Sodium: 144 mmol/L (ref 135–145)

## 2023-09-04 LAB — CBC
HCT: 30 % — ABNORMAL LOW (ref 39.0–52.0)
Hemoglobin: 10.1 g/dL — ABNORMAL LOW (ref 13.0–17.0)
MCH: 27.8 pg (ref 26.0–34.0)
MCHC: 33.7 g/dL (ref 30.0–36.0)
MCV: 82.6 fL (ref 80.0–100.0)
Platelets: 104 10*3/uL — ABNORMAL LOW (ref 150–400)
RBC: 3.63 MIL/uL — ABNORMAL LOW (ref 4.22–5.81)
RDW: 14.9 % (ref 11.5–15.5)
WBC: 20.7 10*3/uL — ABNORMAL HIGH (ref 4.0–10.5)
nRBC: 2 % — ABNORMAL HIGH (ref 0.0–0.2)

## 2023-09-04 LAB — GLUCOSE, CAPILLARY
Glucose-Capillary: 101 mg/dL — ABNORMAL HIGH (ref 70–99)
Glucose-Capillary: 105 mg/dL — ABNORMAL HIGH (ref 70–99)
Glucose-Capillary: 108 mg/dL — ABNORMAL HIGH (ref 70–99)
Glucose-Capillary: 138 mg/dL — ABNORMAL HIGH (ref 70–99)
Glucose-Capillary: 173 mg/dL — ABNORMAL HIGH (ref 70–99)
Glucose-Capillary: 54 mg/dL — ABNORMAL LOW (ref 70–99)
Glucose-Capillary: 80 mg/dL (ref 70–99)
Glucose-Capillary: 96 mg/dL (ref 70–99)

## 2023-09-04 LAB — MAGNESIUM: Magnesium: 2.9 mg/dL — ABNORMAL HIGH (ref 1.7–2.4)

## 2023-09-04 LAB — PHOSPHORUS: Phosphorus: 3.1 mg/dL (ref 2.5–4.6)

## 2023-09-04 MED ORDER — FOLIC ACID 1 MG PO TABS
1.0000 mg | ORAL_TABLET | Freq: Every day | ORAL | Status: DC
  Administered 2023-09-05 – 2023-09-08 (×4): 1 mg via ORAL
  Filled 2023-09-04 (×4): qty 1

## 2023-09-04 MED ORDER — ENSURE ENLIVE PO LIQD
237.0000 mL | Freq: Three times a day (TID) | ORAL | Status: DC
  Administered 2023-09-04: 237 mL via ORAL

## 2023-09-04 MED ORDER — DEXTROSE 50 % IV SOLN
INTRAVENOUS | Status: AC
  Administered 2023-09-04: 25 g via INTRAVENOUS
  Filled 2023-09-04: qty 50

## 2023-09-04 MED ORDER — THIAMINE MONONITRATE 100 MG PO TABS
100.0000 mg | ORAL_TABLET | Freq: Every day | ORAL | Status: DC
  Administered 2023-09-05 – 2023-09-08 (×4): 100 mg via ORAL
  Filled 2023-09-04 (×4): qty 1

## 2023-09-04 MED ORDER — PANTOPRAZOLE SODIUM 40 MG PO TBEC
40.0000 mg | DELAYED_RELEASE_TABLET | Freq: Every day | ORAL | Status: DC
  Administered 2023-09-04 – 2023-09-07 (×4): 40 mg via ORAL
  Filled 2023-09-04 (×4): qty 1

## 2023-09-04 MED ORDER — POTASSIUM CHLORIDE CRYS ER 20 MEQ PO TBCR
40.0000 meq | EXTENDED_RELEASE_TABLET | Freq: Once | ORAL | Status: AC
  Administered 2023-09-04: 40 meq via ORAL
  Filled 2023-09-04: qty 2

## 2023-09-04 MED ORDER — METOPROLOL SUCCINATE ER 25 MG PO TB24
12.5000 mg | ORAL_TABLET | Freq: Every day | ORAL | Status: DC
  Administered 2023-09-04 – 2023-09-08 (×5): 12.5 mg via ORAL
  Filled 2023-09-04 (×2): qty 1
  Filled 2023-09-04: qty 0.5
  Filled 2023-09-04: qty 1
  Filled 2023-09-04: qty 0.5

## 2023-09-04 MED ORDER — KATE FARMS STANDARD 1.4 PO LIQD
325.0000 mL | Freq: Three times a day (TID) | ORAL | Status: DC
  Administered 2023-09-04 – 2023-09-08 (×9): 325 mL via ORAL
  Filled 2023-09-04: qty 325

## 2023-09-04 MED ORDER — ADULT MULTIVITAMIN W/MINERALS CH
1.0000 | ORAL_TABLET | Freq: Every day | ORAL | Status: DC
  Administered 2023-09-05 – 2023-09-08 (×4): 1 via ORAL
  Filled 2023-09-04 (×4): qty 1

## 2023-09-04 MED ORDER — DEXTROSE 50 % IV SOLN: 25.0000 g | INTRAVENOUS | Status: AC

## 2023-09-04 MED ORDER — ORAL CARE MOUTH RINSE: 15.0000 mL | OROMUCOSAL | Status: DC | PRN

## 2023-09-04 NOTE — Plan of Care (Signed)
  Problem: Clinical Measurements: Goal: Ability to maintain clinical measurements within normal limits will improve 09/04/2023 0529 by Mena Goes, Chipper Oman, RN Outcome: Progressing 09/04/2023 0526 by Mena Goes, Chipper Oman, RN Outcome: Progressing   Problem: Clinical Measurements: Goal: Respiratory complications will improve 09/04/2023 0529 by Mena Goes, Chipper Oman, RN Outcome: Progressing 09/04/2023 0526 by Alveda Reasons, RN Outcome: Progressing   Problem: Clinical Measurements: Goal: Cardiovascular complication will be avoided Outcome: Progressing   Problem: Coping: Goal: Level of anxiety will decrease Outcome: Progressing   Problem: Safety: Goal: Ability to remain free from injury will improve Outcome: Progressing   Problem: Skin Integrity: Goal: Risk for impaired skin integrity will decrease Outcome: Progressing   Problem: Cardiovascular: Goal: Ability to achieve and maintain adequate cardiovascular perfusion will improve Outcome: Progressing

## 2023-09-04 NOTE — Progress Notes (Signed)
Occupational Therapy Treatment Patient Details Name: Mark Mack MRN: [id number] DOB: [birth year] Today's Date: 09/04/2023   History of present illness Patient is a 67 year old male presenting with cardiac arrest at home, CPR initiated at home prior to EMS. Found to have inferior STEMI s/p cath lab for [facility]. Patient was intubated and was extubated on 11/5.   OT comments  Chart reviewed prior to tx session. Pt seen for OT/PT treatment on this date. Tx session targeted improved ADL activity tolerance and independence. Upon arrival to room greeted with PT in room. Pt was alert and eager to work with therapy. Pt overall slight decreased in cognition compared to day before. Presenting with delayed processing, and increased time for one step directions throughout. Pt requires MINA-MODA to t/f from supine <> EOB. Required MAX A for peri care, tolerated standing during cleaning. Pt on 4 L HFNC upon arrival however increased to 6L during ambulation. Pt amb into hallway ~19ft CGA +2 assist for lines/lead management. Pt RR is inconsistent throughout. Pt left in recliner with nurse tech in room. Pt making progress toward goals, will continue to follow POC. Discharge recommendation remains appropriate. OT will follow acutely.           If plan is discharge home, recommend the following:  A lot of help with walking and/or transfers;Help with stairs or ramp for entrance;Assist for transportation;Direct supervision/assist for medications management;Direct supervision/assist for financial management;A lot of help with bathing/dressing/bathroom;Assistance with cooking/housework;Assistance with feeding   Equipment Recommendations  Other (comment) (Next venue)    Recommendations for Other Services      Precautions / Restrictions Precautions Precautions: Fall Precaution Comments: monitor Sp02/ RR Restrictions Weight Bearing Restrictions: No       Mobility Bed Mobility Overal bed mobility: Needs  Assistance Bed Mobility: Supine to Sit     Supine to sit: Min assist, Mod assist, HOB elevated, Used rails Sit to supine: Mod assist   General bed mobility comments: Increaed time requied supine <> EOB, required MINA-MODA +1, verbal cues throughout. Pt amb into hallway ~80ft CGA +2 assist for lines/lead management.    Transfers Overall transfer level: Needs assistance Equipment used: Rolling walker (2 wheels) Transfers: Sit to/from Stand Sit to Stand: Contact guard assist, Supervision, Min assist           General transfer comment: 2x STS trails at EOB, 2x STS from recliner - CGA +1 for safety on final STS from recliner     Balance Overall balance assessment: Needs assistance Sitting-balance support: Feet supported, No upper extremity supported Sitting balance-Leahy Scale: Fair     Standing balance support: Bilateral upper extremity supported, Reliant on assistive device for balance, During functional activity Standing balance-Leahy Scale: Fair                             ADL either performed or assessed with clinical judgement   ADL Overall ADL's : Needs assistance/impaired                             Toileting- Clothing Manipulation and Hygiene: Maximal assistance;Sit to/from stand;Cueing for sequencing Toileting - Clothing Manipulation Details (indicate cue type and reason): Pt soiled on arrival to room, pt unaware            Extremity/Trunk Assessment              Vision  Perception     Praxis      Cognition Arousal: Alert Behavior During Therapy: WFL for tasks assessed/performed Overall Cognitive Status: Impaired/Different from baseline Area of Impairment: Orientation, Memory, Following commands, Safety/judgement, Problem solving                     Memory: Decreased short-term memory, Decreased recall of precautions Following Commands: Follows one step commands with increased time     Problem Solving:  Slow processing, Decreased initiation, Difficulty sequencing, Requires verbal cues, Requires tactile cues          Exercises      Shoulder Instructions       General Comments Lines and leads intact pre/post tx, Pt on 4L via [place] - During mobility BP: 144/104 (MAP 113), HR 106    Pertinent Vitals/ Pain       Pain Assessment Pain Assessment: No/denies pain  Home Living                                          Prior Functioning/Environment              Frequency  Min 1X/week        Progress Toward Goals  OT Goals(current goals can now be found in the care plan section)  Progress towards OT goals: Progressing toward goals     Plan      Co-evaluation    PT/OT/SLP Co-Evaluation/Treatment: Yes Reason for Co-Treatment: Complexity of the patient's impairments (multi-system involvement);To address functional/ADL transfers   OT goals addressed during session: ADL's and self-care;Strengthening/ROM      AM-PAC OT "6 Clicks" Daily Activity     Outcome Measure   Help from another person eating meals?: A Little Help from another person taking care of personal grooming?: A Little Help from another person toileting, which includes using toliet, bedpan, or urinal?: A Lot Help from another person bathing (including washing, rinsing, drying)?: A Lot Help from another person to put on and taking off regular upper body clothing?: A Lot Help from another person to put on and taking off regular lower body clothing?: A Lot 6 Click Score: 14    End of Session Equipment Utilized During Treatment: Oxygen;Rolling walker (2 wheels)  OT Visit Diagnosis: Other abnormalities of gait and mobility (R26.89);Muscle weakness (generalized) (M62.81)   Activity Tolerance Patient tolerated treatment well   Patient Left in chair;with call bell/phone within reach;with bed alarm set;with family/visitor present;with nursing/sitter in room   Nurse Communication Mobility  status;Other (comment) (vitals)        Time: 0936-1000 OT Time Calculation (min): 24 min  Charges: OT General Charges $OT Visit: 1 Visit OT Treatments $Therapeutic Activity: 8-22 mins  [employer], OTS

## 2023-09-04 NOTE — Progress Notes (Signed)
Speech Language Pathology Treatment: Dysphagia  Patient Details Name: Mark Mack MRN: [id number] DOB: Jun 10, 1956 Today's Date: 09/04/2023 Time: 1761-6073 SLP Time Calculation (min) (ACUTE ONLY): 45 min  Assessment / Plan / Recommendation Clinical Impression  Pt seen for ongoing assessment of swallowing and trials to upgrade to an oral diet today hopefully. Pt awake, verbal and able to engage in short phrases/communication w/ this SLP and Wife/staff w/out much increased WOB d/t the exertion of talking. He responded to basic questions re: self, environment. Noted min slower processing time and frequent repetition/echolalia of the aspiration precaution instructions. Noted RR in low-mid20s w/ exertion. Pt is currently on HFNC 4-7L, FIO2 25%. RR remained b/t 19-27 during this session. Less overt WOB vs yesterday at BSE.  Afebrile. WBC remains elevated.  Pt explained general aspiration precautions and agreed verbally to the need for following them especially sitting upright for all oral intake, SMALL sips/bites SLOWLY. Need for Rest Breaks b/t bites/sips to avoid increased WOB from exertion -- encouraged Wife to monitor also. Supported behind the back for more full upright sitting.  Pt assisted to feed himself po trials of thin liquids, purees, and soft solids (thin liquids via cup). No overt clinical s/s of aspiration were noted w/ any consistency; respiratory status remained unlabored and returned to its baseline w/ intermittent rest breaks, vocal quality clear b/t trials, no coughing/throat clearing. Oral phase appeared [facility] for bolus management, mastication, and timely A-P transfer for swallowing; oral clearing achieved w/ all consistencies. NSG denied any deficits w/ therapeutic trials given since yesterday. Pt was eager to eat/drink he stated.    Pt appears at reduced risk for aspriation when following aspiration precautions w/ a slightly modified diet. Recommend continue a more mech soft diet(dys 3)  for ease of soft foods w/ gravies added to moisten foods; Thin liquids via Cup. Recommend aspiration precautions as practiced during session; Pills Whole vs Crushed in Puree; tray setup and positioning assistance for meals. Rest breaks during meals and reduced talking.  ST services will continue to monitor for any further swallowing needs. NSG updated. Precautions posted at bedside.  Recommend ST services f/u w/ cognitive-linguistic evaluation in setting of suspected cognitive deficits s/p cardiac arrest/CPR event to address any cognitive-communication needs in ADLs.     HPI HPI: Patient is a 67 year old male presenting with cardiac arrest at home, CPR initiated at home prior to EMS. Found to have inferior STEMI s/p cath lab for [facility]. Patient was intubated ~2.5 days and was extubated on 09/02/2023.  Patient had a witnessed cardiac arrest at home and EMS was activated. CPR initiated at home prior to EMS, which continued, with ROSC achieved. He had multiple rounds of CPR secondary to continued loss of pulses.  CXR: Persistent but improving bilateral airspace disease, consistent  with improving edema or pneumonia.  Head CT: No acute intracranial abnormality, no CT evidence of anoxic injury.  Advanced cerebral white matter changes.      SLP Plan  Continue with current plan of care      Recommendations for follow up therapy are one component of a multi-disciplinary discharge planning process, led by the attending physician.  Recommendations may be updated based on patient status, additional functional criteria and insurance authorization.    Recommendations  Diet recommendations: Dysphagia 3 (mechanical soft);Thin liquid Liquids provided via: Cup;No straw Medication Administration: Whole meds with puree (vs Crushed if needed) Supervision: Patient able to self feed;Intermittent supervision to cue for compensatory strategies Compensations: Minimize environmental distractions;Slow rate;Small  sips/bites;Lingual sweep for clearance of pocketing;Follow solids with liquid Postural Changes and/or Swallow Maneuvers: Out of bed for meals;Seated upright 90 degrees;Upright 30-60 min after meal                 (Dietician) Oral care BID;Oral care before and after PO;Patient independent with oral care (support)   Intermittent Supervision/Assistance (currently) Dysphagia, unspecified (R13.10)     Continue with current plan of care       Jerilynn Som, MS, CCC-SLP Speech Language Pathologist Rehab Services; [facility] -  [phone] (ascom) [place]  09/04/2023, 3:06 PM

## 2023-09-04 NOTE — Progress Notes (Signed)
[facility] CLINIC CARDIOLOGY PROGRESS NOTE   Patient ID: Mark Mack MRN: [id number] DOB/AGE: 66-Sep-1957 66 y.o.  Admit date: 08/31/2023 Primary Physician Hillery Hunter, Graylin Shiver, MD  Primary Cardiologist Dr. Hessie Dibble [facility] - last seen 2019)  HPI: Mark Mack is a 67 y.o. male with a past medical history of CAD s/p CABG x 4 2019, hypertension, hyperlipidemia who was brought to the ED on 08/31/2023 for witnessed cardiac arrest. When ROSC was obtained there was concern for inferolateral STEMI and patient was taken emergently to the cath lab.   Interval History:  -Patient reports he is feeling better today. Continues to improve.  -Worked with PT this AM, tolerated this well. Endorsed dizziness with standing, denies any chest pain or SOB.  -BP and HR remain stable. Did have some PVCs on tele.   Review of systems complete and found to be negative unless listed above    Vitals:   09/04/23 1000 09/04/23 1100 09/04/23 1104 09/04/23 1200  BP: (!) 145/75 (!) 141/80  134/84  Pulse:      Resp: (!) 32 (!) 43  (!) 36  Temp:      TempSrc:      SpO2:   95%   Weight:      Height:         Intake/Output Summary (Last 24 hours) at 09/04/2023 1255 Last data filed at 09/04/2023 1108 Gross per 24 hour  Intake 149.99 ml  Output 3600 ml  Net -3450.01 ml     PHYSICAL EXAM General: Ill appearing, well-nourished, no acute distress. Sitting upright in bedside chair.  HEENT: Normocephalic and atraumatic. Neck: No JVD.  Lungs: Diminished bilaterally. Normal effort on 4L HFNC. Heart: HRRR. Normal S1 and S2 without gallops or murmurs. Radial & DP pulses 2+ bilaterally. Abdomen: Non-distended appearing.  Msk: Normal strength and tone for age. Extremities: No clubbing, cyanosis or edema.     LABS: Basic Metabolic Panel: Recent Labs    09/03/23 0425 09/04/23 0304  NA 140 144  K 3.3* 3.0*  CL 104 111  CO2 27 28  GLUCOSE 96 95  BUN 36* 38*  CREATININE 1.94* 1.95*  CALCIUM 7.1* 7.6*  MG 2.6* 2.9*   PHOS 4.4 3.1   Liver Function Tests: No results for input(s): "AST", "ALT", "ALKPHOS", "BILITOT", "PROT", "ALBUMIN" in the last 72 hours.  Recent Labs    09/02/23 0429  LIPASE 149*  AMYLASE 739*   CBC: Recent Labs    09/03/23 0425 09/04/23 0304  WBC 19.9* 20.7*  HGB 10.0* 10.1*  HCT 30.4* 30.0*  MCV 84.0 82.6  PLT 90* 104*   Cardiac Enzymes: Recent Labs    09/01/23 1748 09/01/23 2304 09/02/23 0429  CKTOTAL  --  6,316* 6,317*  TROPONINIHS 14,097* 16,113* 14,868*   BNP: No results for input(s): "BNP" in the last 72 hours. D-Dimer: No results for input(s): "DDIMER" in the last 72 hours. Hemoglobin A1C: No results for input(s): "HGBA1C" in the last 72 hours.  Fasting Lipid Panel: No results for input(s): "CHOL", "HDL", "LDLCALC", "TRIG", "CHOLHDL", "LDLDIRECT" in the last 72 hours.  Thyroid Function Tests: No results for input(s): "TSH", "T4TOTAL", "T3FREE", "THYROIDAB" in the last 72 hours.  Invalid input(s): "FREET3" Anemia Panel: No results for input(s): "VITAMINB12", "FOLATE", "FERRITIN", "TIBC", "IRON", "RETICCTPCT" in the last 72 hours.  DG Chest Port 1 View  Result Date: 09/02/2023 CLINICAL DATA:  Short of breath EXAM: PORTABLE CHEST 1 VIEW COMPARISON:  08/31/2023 FINDINGS: Single frontal view of the chest demonstrates right internal jugular catheter  tip overlying superior vena cava. Cardiac silhouette is stable. There are persistent but improving bilateral ground-glass opacities throughout the lungs. No effusion or pneumothorax. No acute bony abnormalities. IMPRESSION: 1. Persistent but improving bilateral airspace disease, consistent with improving pneumonia or edema. Electronically Signed   By: Sharlet Salina M.D.   On: 09/02/2023 23:50     ECHO as above  TELEMETRY reviewed by me 09/04/23: sinus rhythm rate 80s, PVCs  EKG reviewed by me 09/04/23: sinus rhythm rate 80 bpm  DATA reviewed by me 09/04/23: last 24h vitals tele labs imaging I/O, PCCM  progress note, neurology note  Principal Problem:   Cardiac arrest [facility]) Active Problems:   STEMI (ST elevation myocardial infarction) (HCC)    ASSESSMENT AND PLAN: Mark Mack is a 67 y.o. male with a past medical history of CAD s/p CABG x 4 2019, hypertension, hyperlipidemia who was brought to the ED on 08/31/2023 for witnessed cardiac arrest. When ROSC was obtained there was concern for inferolateral STEMI and patient was taken emergently to the cath lab.   # Cardiac arrest # STEMI # Coronary artery disease s/p CABG x4 2019 Patient with witnessed cardiac arrest, ROSC obtained and EKG concerning for inferolateral STEMI. Patient taken urgently to cath lab 11/3, unsuccessful attempts to cross ostial LCx and prox-mid RCA lesions. PCWP of 25 mmHg. Required multiple vasopressors, now weaned completed. Echo this admission demonstrated preserved EF.  -Will hold asa, plavix given alveolar hemorrhage. Recommend starting once appropriate.  -Recommend addition of statin once LFTs are improved.  -Consider restarting home BP meds as BP improves.   # Acute Hypoxic and Hypercapnic Respiratory Failure # Pulmonary contusion # Bland alveolar hemorrhage Bronch done 11/3 demonstrated alveolar hemorrhage likely 2/2 pulmonary contusion. Extubated 09/02/23. -Management per PCCM.   #AKI on CKD #Metabolic acidosis Cr 1.24 on admission, 1.94 on AM labs today. -Continue to monitor renal function closely.    This patient's case was discussed and created with Dr. Melton Alar and she is in agreement.  Signed:  [name], PA-C  09/04/2023, 12:55 PM [facility]

## 2023-09-04 NOTE — Progress Notes (Signed)
PHARMACIST - PHYSICIAN COMMUNICATION  DR:   Ophelia Charter  CONCERNING: IV to Oral Route Change Policy  RECOMMENDATION: This patient is receiving thiamine, folic acid, pantoprazole by the intravenous route.  Based on criteria approved by the Pharmacy and Therapeutics Committee, the intravenous medication(s) is/are being converted to the equivalent oral dose form(s).   DESCRIPTION: These criteria include: The patient is eating (either orally or via tube) and/or has been taking other orally administered medications for a least 24 hours The patient has no evidence of active gastrointestinal bleeding or impaired GI absorption (gastrectomy, short bowel, patient on TNA or NPO).  If you have questions about this conversion, please contact the Pharmacy Department  []   [phone] )  [facility] [x]   [phone] )  [facility] []   [phone] )  [facility] []   [phone] )  [facility] []   [phone] )  [facility]   Lowella Bandy, [facility] 09/04/2023 12:08 PM

## 2023-09-04 NOTE — Hospital Course (Addendum)
65QI with h/o HTN, cocaine abuse, and CAD s/p CABG who presented on 11/3 with witnessed cardiac arrest at home.  Code STEMI was called and he was found to have 100% occlusion of the ostial circumflex and proximal RCA with unsuccessful intervention attempts.  He suffered prolonged downtime and was intubated with concern for poor overall prognosis, made DNR.  Extubated on 11/5, currently on HFNC O2.  No longer requiring vasopressors.  Transferred out of ICU.  Awaiting discharge to CIR.  Remains on HFNC O2.

## 2023-09-04 NOTE — Progress Notes (Addendum)
Physical Therapy Treatment Patient Details Name: Mark Mack MRN: [id number] DOB: [birth year] Today's Date: 09/04/2023   History of Present Illness Patient is a 67 year old male presenting with cardiac arrest at home, CPR initiated at home prior to EMS. Found to have inferior STEMI s/p cath lab for [facility]. Patient was intubated and was extubated on 11/5.    PT Comments  Pt was long sitting in bed with supportive spouse and son at bedside. He is alert and cooperative.  Much improved respiratory status from previous date. Pt on 4 L upon arrival however author placed him on 6 L during ambulation. His RR is inconsistent throughout. Encouraged pt to take slower/deeper breaths. +2 assistance for pt/staff safety. He was able to exit bed, stand, and ambulate  2 short bouts. Overall tolerated session well but is far form his baseline abilities. Pt endorses wanting to return to being able to play golf. Acute PT will continue to follow and progress as able per current POC. OT in room at conclusion of session. CIR team update with pt's progress and remains most appropriate DC disposition to maximize pt's independence and safety with all ADLs    If plan is discharge home, recommend the following: A lot of help with walking and/or transfers;A lot of help with bathing/dressing/bathroom;Assistance with cooking/housework;Direct supervision/assist for medications management;Direct supervision/assist for financial management;Assist for transportation;Help with stairs or ramp for entrance     Equipment Recommendations  Other (comment) (defer to next level of care)       Precautions / Restrictions Precautions Precautions: Fall Precaution Comments: monitor Sp02/ RR Restrictions Weight Bearing Restrictions: No     Mobility  Bed Mobility Overal bed mobility: Needs Assistance Bed Mobility: Supine to Sit  Supine to sit: Min assist, Mod assist, HOB elevated, Used rails  General bed mobility comments: increased time  required however able to achieve EOB sitting with +1 min-mod assist. Vcs for sequencing throughout    Transfers Overall transfer level: Needs assistance Equipment used: Rolling walker (2 wheels) Transfers: Sit to/from Stand Sit to Stand: Contact guard assist, Supervision, Min assist  General transfer comment: pt performed STS 2 x EOB prior to 2 more time from recliner. +1 CGA for safety by the last transfer. extensive vcs for handplacement and fwd wt shift    Ambulation/Gait Ambulation/Gait assistance: Contact guard assist, +2 safety/equipment Gait Distance (Feet): 12 Feet Assistive device: Rolling walker (2 wheels) Gait Pattern/deviations: Step-through pattern, Trunk flexed Gait velocity: decreased  General Gait Details: Pt ambulated 2 bouts. 1 x 5 and then 1 x 12 ft with chair follow for additional safety. Vcs for posture correction. pt on 6 L O2 during ambulation. distance limited by fatigue. RR inconsistent throughout session. encouraged pt to slow breathing down if able. Overall pt demonstrated good tolerance to activity but did endorse some dizzziness. BP stable throughout session.    Balance Overall balance assessment: Needs assistance Sitting-balance support: Feet supported, No upper extremity supported Sitting balance-Leahy Scale: Fair     Standing balance support: Bilateral upper extremity supported Standing balance-Leahy Scale: Fair     Cognition Arousal: Alert Behavior During Therapy: WFL for tasks assessed/performed Overall Cognitive Status: Impaired/Different from baseline Area of Impairment: Orientation, Memory, Following commands   Memory: Decreased short-term memory, Decreased recall of precautions Following Commands: Follows one step commands with increased time       General Comments: Pt eager to participate in session, motivated to get better.  Pertinent Vitals/Pain Pain Assessment Pain Assessment: No/denies pain     PT Goals  (current goals can now be found in the care plan section) Acute Rehab PT Goals Patient Stated Goal: get better so I can get back to golfing Progress towards PT goals: Progressing toward goals    Frequency    Min 1X/week           Co-evaluation     PT goals addressed during session: Mobility/safety with mobility;Balance;Proper use of DME;Strengthening/ROM        AM-PAC PT "6 Clicks" Mobility   Outcome Measure  Help needed turning from your back to your side while in a flat bed without using bedrails?: A Lot Help needed moving from lying on your back to sitting on the side of a flat bed without using bedrails?: A Lot Help needed moving to and from a bed to a chair (including a wheelchair)?: A Lot Help needed standing up from a chair using your arms (e.g., wheelchair or bedside chair)?: A Lot Help needed to walk in hospital room?: A Lot Help needed climbing 3-5 steps with a railing? : A Lot 6 Click Score: 12    End of Session   Activity Tolerance: Patient tolerated treatment well;Patient limited by fatigue Patient left: in chair;with call bell/phone within reach;with nursing/sitter in room Nurse Communication: Mobility status PT Visit Diagnosis: Muscle weakness (generalized) (M62.81);Unsteadiness on feet (R26.81)     Time: 4782-9562 PT Time Calculation (min) (ACUTE ONLY): 38 min  Charges:    $Gait Training: 8-22 mins $Therapeutic Activity: 8-22 mins PT General Charges $$ ACUTE PT VISIT: 1 Visit                    Jetta Lout PTA 09/04/23, 10:45 AM

## 2023-09-04 NOTE — Progress Notes (Signed)
   09/04/23 1000  Spiritual Encounters  Type of Visit Follow up  Care provided to: Pt and family  Referral source Chaplain assessment  Reason for visit Routine spiritual support  OnCall Visit No  Spiritual Framework  Patient Stress Factors Not reviewed  Family Stress Factors Not reviewed  Interventions  Spiritual Care Interventions Made Established relationship of care and support;Compassionate presence  Intervention Outcomes  Outcomes Connection to spiritual care;Awareness of support  Spiritual Care Plan  Spiritual Care Issues Still Outstanding Chaplain will continue to follow   During rounds stop to speak with patient he was having a difficult time with news from Medical staff. Patient say he was during fine and I let him know that we are here for him. I spoke with patient's family in the ICU waiting room. The wife and son of the patient say he was doing better. I let them know that we are here for them as well during this time.

## 2023-09-04 NOTE — Progress Notes (Signed)
Progress Note   Patient: Mark Mack EAV:[id number] DOB: 17-Jul-1956 DOA: 08/31/2023     4 DOS: the patient was seen and examined on 09/04/2023   Brief hospital course: 67yo with h/o HTN, cocaine abuse, and CAD s/p CABG who presented on 11/3 with witnessed cardiac arrest at home.  Code STEMI was called and he was found to have 100% occlusion of the ostial circumflex and proximal RCA with unsuccessful intervention attempts.  He suffered prolonged downtime and was intubated with concern for poor overall prognosis, made DNR.  Extubated on 11/5, currently on HFNC O2.  No longer requiring vasopressors.  Assessment and Plan:  Cardiac Arrest Known h/o CAD s/p CABG Continues to use cocaine and used PTA Presented with out of facility arrest and prolonged CPR, ROSC obtained Inferior STEMI with cardiogenic shock Unsuccessful attempts to cross ostial LCx and prox-mid RCA  Required vasopressors, now off Troponin peaked at 16K 11/4 TTE: EF of 67% Cardiology following, appreciate input Will hold asa, plavix given alveolar hemorrhage -restart once appropriate Recommend addition of statin once LFTs are improved Consider restarting home BP meds as BP improves   Post-arrest Metabolic Encephalopathy - resolving No longer requiring sedation 11/5 Repeat Head CT: no evidence of anoxic injury or hemorrhage 11/4 EEG: diffuse encephalopathy without evidence of seizures No longer on Keppra UDS + benzo, cocaine, cannibinoid Neuro following Speech, OT & PT following CIWA protocol Trazodone prn for insomnia Appears to have resolved, he is remarkably lucid at this time    Acute Hypoxic and Hypercapnic Respiratory Failure Patient experienced pulmonary contusion and alveolar hemorrhage resulting from CPR No longer requiring mechanical ventilation Weaned to [facility], now on 4-7L HFNC Continue bronchodilators as needed Ongoing bloody respiratory secretions, will continue to hold anticoagulation/anti=platelet  agents 11/3 CTA ruled out pulmonary embolism 11/5 Chest CT: Lower lobe predominant consolidations and upper lobe predominant ground-glass opacities Continue zosyn for empiric coverage Blood cultures NTD Moved from ICU to SDU today  Pancreatitis/Transaminitis GI prophylaxis, continue protonix 11/5 ABD/Pelvic CT: persistent peripancreatic stranding, no evidence of hemorrhage LFT elevation; AST 285, 567, and ALT 152, 168 - will trend NPO for now, speech will come evaluate for a swallow study   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 AKI due to cardiogenic shock Also with metabolic acidosis Renal function is slowly improving Acidosis has resolved, no longer requiring bicarb Avoid nephrotoxic agents and renal dose medications as needed  Electrolyte abnormalities Replace electrolytes as needed Electrolytes per pharmacy   Acute Anemia/Thrombocytopenia SCDs only for now Heparin, on hold for now given alveolar hemorrhage Trend CBC Consider transfusion if Hemoglobin < 7   Aspiration Pneumonia Continue Zosyn for aspiration pneumonia coverage through 11/9 PCT <0.10 SLP evaluation   Polysubstance abuse UDS positive for BZD, cocaine, THC Patient acknowledges periodic use at "parties" Strict cessation encouraged    Consultants: [facility] Cardiology Neurology Palliative care [facility] team PT/OT SLP CIR  Procedures: CPR 11/3 Intubation 11/3 Art line 11/3 CVC 11/3 Bronchoscopy 11/3 EEG 11/4  Antibiotics: Unasyn 11/3 Zosyn 11/3-9  30 Day Unplanned Readmission Risk Score    Flowsheet Row ED to Hosp-Admission (Current) from 08/31/2023 in [facility] ICU/CCU  30 Day Unplanned Readmission Risk Score (%) 14.02 Filed at 09/04/2023 0801       This score is the patient's risk of an unplanned readmission within 30 days of being discharged (0 -100%). The score is based on dignosis, age, lab data, medications, orders, and past utilization.   Low:  0-14.9   Medium:  15-21.9   High: 22-29.9   Extreme: 30 and above           Subjective: Feeling ok today.  Ongoing mild cough and SOB.  No CP.   Objective: Vitals:   09/04/23 0734 09/04/23 0800  BP:  (!) 143/80  Pulse:    Resp:  (!) 37  Temp:    SpO2: 93%     Intake/Output Summary (Last 24 hours) at 09/04/2023 0848 Last data filed at 09/04/2023 0644 Gross per 24 hour  Intake 213.24 ml  Output 4100 ml  Net -3886.76 ml   Filed Weights   08/31/23 1213 09/01/23 0457  Weight: 66.5 kg 72.2 kg    Exam:  General:  Appears calm and comfortable and is in NAD Eyes:  EOMI, normal lids, iris ENT:  grossly normal hearing, lips & tongue, mmm Neck:  no LAD, masses or thyromegaly Cardiovascular:  RRR, no m/r/g. No LE edema.  Respiratory:   CTA bilaterally with no wheezes/rales/rhonchi.  Normal to mildly increased respiratory effort. Abdomen:  soft, NT, ND Skin:  no rash or induration seen on limited exam Musculoskeletal:  grossly normal tone BUE/BLE, good ROM, no bony abnormality Psychiatric:  blunted mood and affect, speech fluent and appropriate, AOx3 Neurologic:  CN 2-12 grossly intact, moves all extremities in coordinated fashion  Data Reviewed: I have reviewed the patient's lab results since admission.  Pertinent labs for today include:  K+ 3.0 BUN 38/Creatinine 1.95/GFR 37; down from 29/2.28/31 on 11/4 Mag++ 2.9 WBC 20.7, increasing Hgb 10.1, stable Platelets 104, improved     Family Communication: I spoke with his wife and son in the waiting room  Disposition: Status is: Inpatient Remains inpatient appropriate because: still very ill  Total critical care time: 50 minutes Critical care time was exclusive of separately billable procedures and treating other patients. Critical care was necessary to treat or prevent imminent or life-threatening deterioration. Critical care was time spent personally by me on the following activities: development of treatment plan with patient and/or  surrogate as well as nursing, discussions with consultants, evaluation of patient's response to treatment, examination of patient, obtaining history from patient or surrogate, ordering and performing treatments and interventions, ordering and review of laboratory studies, ordering and review of radiographic studies, pulse oximetry and re-evaluation of patient's condition.     Unresulted Labs (From admission, onward)     Start     Ordered   09/05/23 0500  Basic metabolic panel  Tomorrow morning,   R       Question:  Specimen collection method  Answer:  Unit=Unit collect   09/04/23 0710   09/05/23 0500  Magnesium  Tomorrow morning,   R       Question:  Specimen collection method  Answer:  Unit=Unit collect   09/04/23 0710             Author: Jonah Blue, MD 09/04/2023 8:48 AM  For on call review [email or web address].

## 2023-09-04 NOTE — Progress Notes (Signed)
? ?  Inpatient Rehab Admissions Coordinator : ? ?Per therapy recommendations, patient was screened for CIR candidacy by Blue Ruggerio RN MSN.  At this time patient appears to be a potential candidate for CIR. I will place a rehab consult per protocol for full assessment. Please call me with any questions. ? ?Jerriyah Louis RN MSN ?Admissions Coordinator ?[phone] ?  ?

## 2023-09-04 NOTE — Progress Notes (Signed)
Nutrition Follow Up Note   DOCUMENTATION CODES:   Not applicable  INTERVENTION:   Ensure Enlive po TID, each supplement provides 350 kcal and 20 grams of protein.  MVI, thiamine and folic acid po daily   Pt at high refeed risk; recommend monitor potassium, magnesium and phosphorus labs daily until stable  Daily weights   NUTRITION DIAGNOSIS:   Inadequate oral intake related to inability to eat (pt sedated and ventilated) as evidenced by NPO status. -progressing   GOAL:   Patient will meet greater than or equal to 90% of their needs -not met   MONITOR:   PO intake, Supplement acceptance, Labs, Weight trends, Skin, I & O's  ASSESSMENT:   67 y/o male with h/o CAD status post CABG, COPD, etoh and cocaine abuse, GERD, CKD and HTN who is admitted with STEMI, cardiac arrest, AMS, possible aspiration and suspected pancreatitis.  Pt s/p R heart cath 11/3  Met with pt in room today. Pt is sitting up in his chair. Pt reports that he is feeling ok today. Pt is asking for food and water. Pt denies any abdominal pain or nausea. Pt seen by SLP today and was approved for a diet. RD discussed with pt the importance of adequate nutrition needed to preserve lean muscle and to support healing. Pt is agreeable to drinking supplements. RD will add supplements and MVI to help pt meet his estimated needs. Pt is at high refeed risk. No new weight since 11/4.   Medications reviewed and include: folic acid, insulin, MVI, protonix, zosyn, thiamine   Labs reviewed: K 3.0(L), BUN 38(H), creat 1.95(H), P 3.1 wnl, Mg 2.9(H) Amylase- 739(H), lipase 149(H)- 11/5 wbcs- 20.7(H), Hgb 10.1(L), Hct 30.0(L) Cbgs- 138, 54, 101, 105, 80 x 24hrs   Diet Order:   Diet Order             DIET DYS 3 Room service appropriate? Yes with Assist; Fluid consistency: Thin  Diet effective now                  EDUCATION NEEDS:   No education needs have been identified at this time  Skin:  Skin Assessment:  Reviewed RN Assessment  Last BM:  11/7- type 6  Height:   Ht Readings from Last 1 Encounters:  08/31/23 5\' 6"  (1.676 m)    Weight:   Wt Readings from Last 1 Encounters:  09/01/23 72.2 kg    Ideal Body Weight:  64.5 kg  BMI:  Body mass index is 25.69 kg/m.  Estimated Nutritional Needs:   Kcal:  1900-2200kcal/day  Protein:  95-110g/day  Fluid:  1.7-1.9L/day  Betsey Holiday MS, RD, LDN Please refer to [facility] for RD and/or RD on-call/weekend/after hours pager

## 2023-09-04 NOTE — Progress Notes (Signed)
PHARMACY CONSULT NOTE - FOLLOW UP  Pharmacy Consult for Electrolyte Monitoring and Replacement   Recent Labs: Potassium (mmol/L)  Date Value  09/04/2023 3.0 (L)   Magnesium (mg/dL)  Date Value  13/05/6577 2.9 (H)   Calcium (mg/dL)  Date Value  46/96/2952 7.6 (L)   Albumin (g/dL)  Date Value  84/13/2440 2.5 (L)   Phosphorus (mg/dL)  Date Value  08/24/2535 3.1   Sodium (mmol/L)  Date Value  09/04/2023 144   Corrected Ca: 8.8 mg/dL  Assessment: 67 y.o. male with a past medical history of CAD s/p CABG x 4 2019, hypertension, hyperlipidemia who was brought to the ED on 08/31/2023 for witnessed cardiac arrest. Pharmacy is asked to follow and replace electrolytes while in CCU.   Goal of Therapy:  Electrolytes WNL  Plan:  K+ 3.0; KCl 40 mEq PO x 1 ordered by NP  F/u electrolytes tomorrow with AM labs   Littie Deeds, PharmD Pharmacy Resident  09/04/2023 7:06 AM

## 2023-09-05 ENCOUNTER — Encounter: Payer: Self-pay | Admitting: Internal Medicine

## 2023-09-05 DIAGNOSIS — I469 Cardiac arrest, cause unspecified: Secondary | ICD-10-CM | POA: Diagnosis not present

## 2023-09-05 LAB — BASIC METABOLIC PANEL
Anion gap: 8 (ref 5–15)
BUN: 31 mg/dL — ABNORMAL HIGH (ref 8–23)
CO2: 25 mmol/L (ref 22–32)
Calcium: 8 mg/dL — ABNORMAL LOW (ref 8.9–10.3)
Chloride: 115 mmol/L — ABNORMAL HIGH (ref 98–111)
Creatinine, Ser: 1.57 mg/dL — ABNORMAL HIGH (ref 0.61–1.24)
GFR, Estimated: 48 mL/min — ABNORMAL LOW (ref >60)
Glucose, Bld: 105 mg/dL — ABNORMAL HIGH (ref 70–99)
Potassium: 3.4 mmol/L — ABNORMAL LOW (ref 3.5–5.1)
Sodium: 148 mmol/L — ABNORMAL HIGH (ref 135–145)

## 2023-09-05 LAB — CBC WITH DIFFERENTIAL/PLATELET
Abs Immature Granulocytes: 0.44 10*3/uL — ABNORMAL HIGH (ref 0.00–0.07)
Basophils Absolute: 0.1 10*3/uL (ref 0.0–0.1)
Basophils Relative: 0 %
Eosinophils Absolute: 0 10*3/uL (ref 0.0–0.5)
Eosinophils Relative: 0 %
HCT: 34.2 % — ABNORMAL LOW (ref 39.0–52.0)
Hemoglobin: 11.2 g/dL — ABNORMAL LOW (ref 13.0–17.0)
Immature Granulocytes: 3 %
Lymphocytes Relative: 10 %
Lymphs Abs: 1.6 10*3/uL (ref 0.7–4.0)
MCH: 28.2 pg (ref 26.0–34.0)
MCHC: 32.7 g/dL (ref 30.0–36.0)
MCV: 86.1 fL (ref 80.0–100.0)
Monocytes Absolute: 1.6 10*3/uL — ABNORMAL HIGH (ref 0.1–1.0)
Monocytes Relative: 10 %
Neutro Abs: 13 10*3/uL — ABNORMAL HIGH (ref 1.7–7.7)
Neutrophils Relative %: 77 %
Platelets: 115 10*3/uL — ABNORMAL LOW (ref 150–400)
RBC: 3.97 MIL/uL — ABNORMAL LOW (ref 4.22–5.81)
RDW: 15.3 % (ref 11.5–15.5)
Smear Review: NORMAL
WBC: 16.8 10*3/uL — ABNORMAL HIGH (ref 4.0–10.5)
nRBC: 3.4 % — ABNORMAL HIGH (ref 0.0–0.2)

## 2023-09-05 LAB — HEPATIC FUNCTION PANEL
ALT: 75 U/L — ABNORMAL HIGH (ref 0–44)
AST: 103 U/L — ABNORMAL HIGH (ref 15–41)
Albumin: 2.9 g/dL — ABNORMAL LOW (ref 3.5–5.0)
Alkaline Phosphatase: 84 U/L (ref 38–126)
Bilirubin, Direct: 0.3 mg/dL — ABNORMAL HIGH (ref 0.0–0.2)
Indirect Bilirubin: 1 mg/dL — ABNORMAL HIGH (ref 0.3–0.9)
Total Bilirubin: 1.3 mg/dL — ABNORMAL HIGH (ref <1.2)
Total Protein: 6.3 g/dL — ABNORMAL LOW (ref 6.5–8.1)

## 2023-09-05 LAB — GLUCOSE, CAPILLARY
Glucose-Capillary: 190 mg/dL — ABNORMAL HIGH (ref 70–99)
Glucose-Capillary: 118 mg/dL — ABNORMAL HIGH (ref 70–99)
Glucose-Capillary: 137 mg/dL — ABNORMAL HIGH (ref 70–99)
Glucose-Capillary: 90 mg/dL (ref 70–99)
Glucose-Capillary: 102 mg/dL — ABNORMAL HIGH (ref 70–99)

## 2023-09-05 LAB — CULTURE, BLOOD (ROUTINE X 2)
Culture: NO GROWTH
Culture: NO GROWTH
Special Requests: ADEQUATE

## 2023-09-05 LAB — MAGNESIUM: Magnesium: 2.8 mg/dL — ABNORMAL HIGH (ref 1.7–2.4)

## 2023-09-05 LAB — CARDIAC CATHETERIZATION: Cath EF Quantitative: 45 %

## 2023-09-05 LAB — PHOSPHORUS: Phosphorus: 2.5 mg/dL (ref 2.5–4.6)

## 2023-09-05 MED ORDER — LOSARTAN POTASSIUM 25 MG PO TABS
25.0000 mg | ORAL_TABLET | Freq: Every day | ORAL | Status: DC
  Administered 2023-09-05 – 2023-09-08 (×4): 25 mg via ORAL
  Filled 2023-09-05 (×4): qty 1

## 2023-09-05 MED ORDER — PIPERACILLIN-TAZOBACTAM 3.375 G IVPB
3.3750 g | Freq: Three times a day (TID) | INTRAVENOUS | Status: AC
  Administered 2023-09-05 – 2023-09-06 (×5): 3.375 g via INTRAVENOUS
  Filled 2023-09-05 (×6): qty 50

## 2023-09-05 MED ORDER — POTASSIUM CHLORIDE CRYS ER 20 MEQ PO TBCR
20.0000 meq | EXTENDED_RELEASE_TABLET | Freq: Once | ORAL | Status: AC
  Administered 2023-09-05: 20 meq via ORAL
  Filled 2023-09-05: qty 1

## 2023-09-05 MED ORDER — LACTATED RINGERS IV SOLN: INTRAVENOUS | Status: AC

## 2023-09-05 MED ORDER — NITROGLYCERIN 0.4 MG SL SUBL
0.4000 mg | SUBLINGUAL_TABLET | SUBLINGUAL | Status: DC | PRN
  Filled 2023-09-05: qty 1

## 2023-09-05 NOTE — Plan of Care (Signed)
  Problem: Education: Goal: Knowledge of General Education information will improve Description: Including pain rating scale, medication(s)/side effects and non-pharmacologic comfort measures Outcome: Progressing   Problem: Clinical Measurements: Goal: Respiratory complications will improve Outcome: Progressing   Problem: Activity: Goal: Risk for activity intolerance will decrease Outcome: Progressing   Problem: Coping: Goal: Level of anxiety will decrease Outcome: Progressing   Problem: Pain Management: Goal: General experience of comfort will improve Outcome: Progressing   Problem: Safety: Goal: Ability to remain free from injury will improve Outcome: Progressing

## 2023-09-05 NOTE — Progress Notes (Signed)
Physical Therapy Treatment Patient Details Name: Mark Mack MRN: [id number] DOB: 13-Oct-1956 Today's Date: 09/05/2023   History of Present Illness Patient is a 67 year old male presenting with cardiac arrest at home, CPR initiated at home prior to EMS. Found to have inferior STEMI s/p cath lab for [facility]. Patient was intubated and was extubated on 11/5.    PT Comments  Pt was sitting in recliner upon arrival. He is alert but Thereasa Parkin questions his overall awareness of current situation. He remains extremely motivated and cooperative throughout. Does present with flat affect. C/o 2/10 chest pain however HR and all vitals remain stable. Pt was on 2 L o2 with sao2 when good pleth observed > 88%. +2 assistance for safety and chair follow. Pt ambulated 2 x ~ 35 ft without LOB. Does have one coughing spell in which he coughed up a little blood. Per SLP has been having this since admission, overall pt continues to improve but is far from his baseline. CIR MD arrived to discuss CIR admit. Acute PT will continue to follow and progress per current POC. DC recs remain appropriate.    If plan is discharge home, recommend the following: A lot of help with walking and/or transfers;A lot of help with bathing/dressing/bathroom;Assistance with cooking/housework;Direct supervision/assist for medications management;Direct supervision/assist for financial management;Assist for transportation;Help with stairs or ramp for entrance     Equipment Recommendations  Other (comment)       Precautions / Restrictions Precautions Precautions: Fall Precaution Comments: monitor Sp02/ RR Restrictions Weight Bearing Restrictions: No     Mobility  Bed Mobility  General bed mobility comments: in recliner pre/post session    Transfers Overall transfer level: Needs assistance Equipment used: Rolling walker (2 wheels) Transfers: Sit to/from Stand Sit to Stand: Contact guard assist, Min assist  General transfer comment: pt  requires CGA-MIN assist to stand from recliner with vcs for handplacement, fwd wt shift, and overall sequencing improvements    Ambulation/Gait Ambulation/Gait assistance: Contact guard assist, +2 safety/equipment Gait Distance (Feet): 30 Feet Assistive device: Rolling walker (2 wheels) Gait Pattern/deviations: Step-through pattern, Trunk flexed Gait velocity: decreased  General Gait Details: pt ambulated on 2 L o2  2 x ~ 30 ft with chair follow for additional safety. Pt has one episode of coughing up small amount of blood but per SLP has been since admission.    Balance Overall balance assessment: Needs assistance Sitting-balance support: Feet supported, No upper extremity supported Sitting balance-Leahy Scale: Fair     Standing balance support: Bilateral upper extremity supported, Reliant on assistive device for balance, During functional activity Standing balance-Leahy Scale: Fair Standing balance comment: external support required     Cognition Arousal: Alert Behavior During Therapy: WFL for tasks assessed/performed Overall Cognitive Status: Impaired/Different from baseline Area of Impairment: Orientation, Memory, Following commands, Safety/judgement, Problem solving   Orientation Level: Situation, Time, Disoriented to     Following Commands: Follows one step commands consistently, Follows multi-step commands inconsistently Safety/Judgement: Decreased awareness of safety, Decreased awareness of deficits   Problem Solving: Slow processing, Decreased initiation, Difficulty sequencing, Requires verbal cues, Requires tactile cues General Comments: Pt eager to participate in session, motivated to get better.               Pertinent Vitals/Pain Pain Assessment Pain Assessment: 0-10 Pain Score: 2  Pain Location: chest pain Pain Descriptors / Indicators: Discomfort Pain Intervention(s): Limited activity within patient's tolerance, Monitored during session, Premedicated  before session, Repositioned     PT Goals (current  goals can now be found in the care plan section) Acute Rehab PT Goals Patient Stated Goal: get better so I can get back to golfing Progress towards PT goals: Progressing toward goals    Frequency    Min 1X/week       Co-evaluation     PT goals addressed during session: Mobility/safety with mobility;Balance;Proper use of DME        AM-PAC PT "6 Clicks" Mobility   Outcome Measure  Help needed turning from your back to your side while in a flat bed without using bedrails?: A Little Help needed moving from lying on your back to sitting on the side of a flat bed without using bedrails?: A Lot Help needed moving to and from a bed to a chair (including a wheelchair)?: A Lot Help needed standing up from a chair using your arms (e.g., wheelchair or bedside chair)?: A Lot Help needed to walk in hospital room?: A Lot Help needed climbing 3-5 steps with a railing? : A Lot 6 Click Score: 13    End of Session   Activity Tolerance: Patient tolerated treatment well;Patient limited by fatigue Patient left: in chair;with call bell/phone within reach;with nursing/sitter in room Nurse Communication: Mobility status PT Visit Diagnosis: Muscle weakness (generalized) (M62.81);Unsteadiness on feet (R26.81)     Time: 1610-9604 PT Time Calculation (min) (ACUTE ONLY): 19 min  Charges:    $Gait Training: 8-22 mins PT General Charges $$ ACUTE PT VISIT: 1 Visit                     Jetta Lout PTA 09/05/23, 1:57 PM

## 2023-09-05 NOTE — Progress Notes (Addendum)
Pt wife would like to talk to the provide about changing the code from DNR to full code. NP Jon Billings made aware. Will continue to monitor.  Update 2024: See new orders. Will continue to monitor.

## 2023-09-05 NOTE — PMR Pre-admission (Signed)
PMR Admission Coordinator Pre-Admission Assessment  Patient: Mark Mack is an 67 y.o., male MRN: [id number] DOB: [birth year] Height: 5\' 6"  (167.6 cm) Weight: 68.7 kg           Insurance Information HMO:     PPO: yes     PCP:      IPA:      80/20:      OTHER:  PRIMARY: [employer] Medicare      Policy#: [id number]      Subscriber: pt CM Name: Ladonna Snide      Phone#: [phone]     Fax#: [phone] Pre-Cert#: [id number] approved  09/08/23-09/14/23      Employer:  Benefits:  Phone #: [phone]     Name: 11/8 Eff. Date: 10/28/22     Deduct: none      Out of Pocket Max: $4500      Life Max: none  CIR: $295 co pay per day days 1 until 6      SNF: no co pay days 1 until 20; $203 co pay per day days 21 until 100 Outpatient: $20 per visit     Co-Pay:  Home Health: 100%      Co-Pay:  DME: 80%     Co-Pay: 20% Providers: in network  SECONDARY: none  Financial Counselor:       Phone#:   The Data processing manager" for patients in Inpatient Rehabilitation Facilities with attached "Privacy Act Statement-Health Care Records" was provided and verbally reviewed with: Patient and Family  Emergency Contact Information Contact Information   None on File    Other Contacts     Name Relation Home Work Mobile   Leppla,Shirley Spouse [phone]        Current Medical History  Patient Admitting Diagnosis: cardiac arrest  History of Present Illness: 67 year old male past medical history of CAD with 4 vessel CABG in 2019. Also history of HTN and smoking.  Witnessed cardiac arrest at home and presented to [facility] on 11/3.CPR initiated from home. EKG with notable inferior STEMI and emergently taken to the cath lab.   LHC Cath showed occlusions in multiple vessels, though wire could not be passed and no revascularization was performed. Underwent RHC with noted cardiac output of 2.5 and cardiac index of 2.1.Required multiple vasopressors, now weaned. Echo demonstrated preserved EF. Began Losartan  for BP control and continue metoprolol. Will hold ASA, and plavix due to alveolar hemorrhage. Will monitor HGB to determine when to start. Will add statin once LFTS improve. Initially intubated and then extubated on 11/5. Bronch done 11/3 which demonstrated alveolar hemorrhage likely due to pulmonary contusion. Cr 1.24 on admit and slightly elevate to 1.57. Will monitor renal function daily.   Patient's medical record from [facility] has been reviewed by the rehabilitation admission coordinator and physician.  Past Medical History  Past Medical History:  Diagnosis Date   Hypertension    Has the patient had major surgery during 100 days prior to admission? Yes  Family History  family history is not on file.  Current Medications   Current Facility-Administered Medications:    Chlorhexidine Gluconate Cloth 2 % PADS 6 each, 6 each, Topical, Q0600, Dgayli, Khabib, MD, 6 each at 09/05/23 0545   docusate sodium (COLACE) capsule 100 mg, 100 mg, Oral, BID PRN, [name], MD   feeding supplement (KATE FARMS STANDARD 1.4) liquid 325 mL, 325 mL, Oral, TID BM, Jonah Blue, MD, 325 mL at 09/05/23 1405   folic acid (FOLVITE) tablet 1 mg, 1  mg, Oral, Daily, Lowella Bandy, RPH, 1 mg at 09/05/23 0936   insulin aspart (novoLOG) injection 0-6 Units, 0-6 Units, Subcutaneous, Q4H, Lowella Bandy, RPH, 1 Units at 09/05/23 1610   lactated ringers infusion, , Intravenous, Continuous, Jonah Blue, MD   LORazepam (ATIVAN) tablet 1-4 mg, 1-4 mg, Oral, Q1H PRN, Rust-Chester, Cecelia Byars, NP   losartan (COZAAR) tablet 25 mg, 25 mg, Oral, Daily, Hudson, Caralyn, PA-C, 25 mg at 09/05/23 1256   metoprolol succinate (TOPROL-XL) 24 hr tablet 12.5 mg, 12.5 mg, Oral, Daily, Hudson, Caralyn, PA-C, 12.5 mg at 09/05/23 9604   multivitamin with minerals tablet 1 tablet, 1 tablet, Oral, Daily, Jonah Blue, MD, 1 tablet at 09/05/23 0936   nitroGLYCERIN (NITROSTAT) SL tablet 0.4 mg, 0.4 mg, Sublingual, Q5 min PRN, Jonah Blue, MD   ondansetron [facility]) injection 4 mg, 4 mg, Intravenous, Q6H PRN, Callwood, Dwayne D, MD   Oral care mouth rinse, 15 mL, Mouth Rinse, PRN, Rust-Chester, Micheline Rough L, NP   pantoprazole (PROTONIX) EC tablet 40 mg, 40 mg, Oral, QHS, Lowella Bandy, RPH, 40 mg at 09/04/23 2106   piperacillin-tazobactam (ZOSYN) IVPB 3.375 g, 3.375 g, Intravenous, Q8H, Jonah Blue, MD, Last Rate: 12.5 mL/hr at 09/05/23 1405, 3.375 g at 09/05/23 1405   polyethylene glycol (MIRALAX / GLYCOLAX) packet 17 g, 17 g, Oral, Daily PRN, Aundria Rud, Khabib, MD   sodium chloride (OCEAN) 0.65 % nasal spray 1 spray, 1 spray, Each Nare, PRN, Rust-Chester, Britton L, NP, 1 spray at 09/03/23 0232   thiamine (VITAMIN B1) tablet 100 mg, 100 mg, Oral, Daily, Lowella Bandy, RPH, 100 mg at 09/05/23 0936   traZODone (DESYREL) tablet 50 mg, 50 mg, Oral, QHS, Rust-Chester, Britton L, NP, 50 mg at 09/04/23 2106  Patients Current Diet:  Diet Order             DIET DYS 3 Room service appropriate? Yes with Assist; Fluid consistency: Thin  Diet effective now                  Precautions / Restrictions Precautions Precautions: Fall Precaution Comments: monitor Sp02/ RR Restrictions Weight Bearing Restrictions: No   Has the patient had 2 or more falls or a fall with injury in the past year?No  Prior Activity Level Community (5-7x/wk): independent and retired  Prior Functional Level Prior Function Prior Level of Function : Independent/Modified Independent Mobility Comments: retired, independent and active (was working on a car recently) ADLs Comments: independent  Self Care: Did the patient need help bathing, dressing, using the toilet or eating?  Independent  Indoor Mobility: Did the patient need assistance with walking from room to room (with or without device)? Independent  Stairs: Did the patient need assistance with internal or external stairs (with or without device)? Independent  Functional Cognition:  Did the patient need help planning regular tasks such as shopping or remembering to take medications? Independent  Patient Information Are you of Hispanic, Latino/a,or Spanish origin?: A. No, not of Hispanic, Latino/a, or Spanish origin What is your race?: B. Black or African American Do you need or want an interpreter to communicate with a doctor or health care staff?: 0. No  Patient's Response To:  Health Literacy and Transportation Is the patient able to respond to health literacy and transportation needs?: Yes Health Literacy - How often do you need to have someone help you when you read instructions, pamphlets, or other written material from your doctor or pharmacy?: Never In the past 12 months,  has lack of transportation kept you from medical appointments or from getting medications?: No In the past 12 months, has lack of transportation kept you from meetings, work, or from getting things needed for daily living?: No  Home Assistive Devices / Equipment    Prior Device Use: Indicate devices/aids used by the patient prior to current illness, exacerbation or injury? None of the above  Current Functional Level Cognition  Overall Cognitive Status: Impaired/Different from baseline Orientation Level: Oriented X4 Following Commands: Follows one step commands consistently, Follows multi-step commands inconsistently Safety/Judgement: Decreased awareness of safety, Decreased awareness of deficits General Comments: Pt eager to participate in session, motivated to get better.    Extremity Assessment (includes Sensation/Coordination)  Upper Extremity Assessment: Generalized weakness  Lower Extremity Assessment: Generalized weakness    ADLs  Overall ADL's : Needs assistance/impaired Grooming: Wash/dry face, Wash/dry hands, Minimal assistance, Sitting (using oral suction) Grooming Details (indicate cue type and reason): vcs for completion Lower Body Dressing: Maximal assistance, Bed  level Lower Body Dressing Details (indicate cue type and reason): socks Toileting- Clothing Manipulation and Hygiene: Maximal assistance, Sit to/from stand, Cueing for sequencing Toileting - Clothing Manipulation Details (indicate cue type and reason): Pt soiled on arrival to room, pt unaware    Mobility  Overal bed mobility: Needs Assistance Bed Mobility: Supine to Sit Supine to sit: Min assist, Mod assist, HOB elevated, Used rails Sit to supine: Mod assist General bed mobility comments: in recliner pre/post session    Transfers  Overall transfer level: Needs assistance Equipment used: Rolling walker (2 wheels) Transfers: Sit to/from Stand Sit to Stand: Contact guard assist, Min assist General transfer comment: pt requires CGA-MIN assist to stand from recliner with vcs for handplacement, fwd wt shift, and overall sequencing improvements    Ambulation / Gait / Stairs / Wheelchair Mobility  Ambulation/Gait Ambulation/Gait assistance: Contact guard assist, +2 safety/equipment Gait Distance (Feet): 30 Feet Assistive device: Rolling walker (2 wheels) Gait Pattern/deviations: Step-through pattern, Trunk flexed General Gait Details: pt ambulated on 2 L o2  2 x ~ 30 ft with chair follow for additional safety. Pt has one episode of coughing up small amount of blood but per SLP has been since admission. Gait velocity: decreased Pre-gait activities: Mod A +2 person for weight shifting and marching in place. Standing activity tolerance limited for progression of ambulation    Posture / Balance Balance Overall balance assessment: Needs assistance Sitting-balance support: Feet supported, No upper extremity supported Sitting balance-Leahy Scale: Fair Standing balance support: Bilateral upper extremity supported, Reliant on assistive device for balance, During functional activity Standing balance-Leahy Scale: Fair Standing balance comment: external support required High Level Balance Comments:  When taking marching in place; pt required MOD A + 2 for stability and balance    Special needs/care consideration      Previous Home Environment  Living Arrangements: Spouse/significant other  Lives With: Spouse Available Help at Discharge:  (wife works, dtr works from home and grandson can help) Type of Home: House Home Layout: One level Home Access: Stairs to enter Secretary/administrator of Steps: 2 Bathroom Shower/Tub: Engineer, manufacturing systems: Standard Bathroom Accessibility: Yes How Accessible: Accessible via walker Home Care Services: No  Discharge Living Setting Plans for Discharge Living Setting: Patient's home, Lives with (comment) (wife) Type of Home at Discharge: House Discharge Home Layout: One level Discharge Home Access: Stairs to enter Entrance Stairs-Rails: None Entrance Stairs-Number of Steps: 2 Discharge Bathroom Shower/Tub: Tub/shower unit Discharge Bathroom Toilet: Standard Discharge Bathroom Accessibility: Yes  How Accessible: Accessible via walker Does the patient have any problems obtaining your medications?: No  Social/Family/Support Systems Patient Roles: Spouse, Parent Contact Information: wife,Shirley Anticipated Caregiver: wife, daughter and grandson Anticipated Caregiver's Contact Information: see contacts Ability/Limitations of Caregiver: wife works Engineer, structural Availability: 24/7 Discharge Plan Discussed with Primary Caregiver: Yes Is Caregiver In Agreement with Plan?: Yes Does Caregiver/Family have Issues with Lodging/Transportation while Pt is in Rehab?: No  Goals Patient/Family Goal for Rehab: Mod I to supervision with PT, OT and SLP Expected length of stay: ELOS 5 to 7 days Pt/Family Agrees to Admission and willing to participate: Yes Program Orientation Provided & Reviewed with Pt/Caregiver Including Roles  & Responsibilities: Yes  Decrease burden of Care through IP rehab admission: n/a  Possible need for SNF placement upon  discharge:not anticipated  Patient Condition: This patient's medical and functional status has remained the same since the consult dated: 09/05/23 in which the Rehabilitation Physician determined and documented that the patient's condition is appropriate for intensive rehabilitative care in an inpatient rehabilitation facility. See "History of Present Illness" (above) for medical update. Functional changes are: patient needs min A for ambulation 30 feet and minA for transfers +2 for safety. Patient's medical and functional status update has been discussed with the Rehabilitation physician and patient remains appropriate for inpatient rehabilitation. Will admit to inpatient rehab today.  Preadmission Screen Completed By:  Clois Dupes, RN MSN 09/05/2023 3:15 PM, Updates provided by Lissa Merlin, PT 09/08/23, 12:00 pm ______________________________________________________________________   Discussed status with Dr. Riley Kill on 09/08/23 at 12:00 pm and received approval for admission today.  Admission Coordinator:  Clois Dupes, RN MSN updates provided by Lissa Merlin, PT time12:36 pm/Date11/11/24.

## 2023-09-05 NOTE — Progress Notes (Signed)
Occupational Therapy Treatment Patient Details Name: Mark Mack MRN: [id number] DOB: 1955/12/08 Today's Date: 09/05/2023   History of present illness Patient is a 67 year old male presenting with cardiac arrest at home, CPR initiated at home prior to EMS. Found to have inferior STEMI s/p cath lab for [facility]. Patient was intubated and was extubated on 11/5.   OT comments  Chart reviewed prior to tx session. Pt seen for OT treatment on this date. Upon arrival to room pt awake in recliner with wife present. Tx session targeted improved ADL activity tolerance and independence. Pt was alert and oriented x4 on this date. Pt cognition continues to present delayed processing and requiring step-by-step verbal cues during functional activities. Pt is highly motivated to return to PLOF. Pt required MAX A for peri care, tolerated standing during cleaning. Pt on 3L HFNC upon arrival and throughout mobility. Pt amb within room to sink and back to recliner ~64ft CGA +2 assist for lines/lead management. VSS. Pt left in recliner with nurse in room. Pt making good progress toward goals, will continue to follow POC. Discharge recommendation remains appropriate. OT will follow acutely.           If plan is discharge home, recommend the following:  A lot of help with walking and/or transfers;Help with stairs or ramp for entrance;Assist for transportation;Direct supervision/assist for medications management;Direct supervision/assist for financial management;A lot of help with bathing/dressing/bathroom;Assistance with cooking/housework;Assistance with feeding   Equipment Recommendations  Other (comment)    Recommendations for Other Services      Precautions / Restrictions Precautions Precautions: Fall Precaution Comments: monitor Sp02/ RR Restrictions Weight Bearing Restrictions: No       Mobility Bed Mobility               General bed mobility comments: NT pt in recliner pre/post tx     Transfers Overall transfer level: Needs assistance Equipment used: Rolling walker (2 wheels) Transfers: Sit to/from Stand Sit to Stand: Contact guard assist           General transfer comment: pt often steps infront of RW, dispite frequent verbal cues to remin inside RW     Balance Overall balance assessment: Needs assistance Sitting-balance support: Feet supported, No upper extremity supported Sitting balance-Leahy Scale: Fair     Standing balance support: Bilateral upper extremity supported, Reliant on assistive device for balance, During functional activity Standing balance-Leahy Scale: Fair Standing balance comment: reliant on RW during amb, pt trailed static standing with no UE supported for a few seconds with CGA, no LOB noted                           ADL either performed or assessed with clinical judgement   ADL Overall ADL's : Needs assistance/impaired     Grooming: Oral care;Standing;Cueing for sequencing;Cueing for safety Grooming Details (indicate cue type and reason): Sink level UB dressing: MINA for donning gown                      Toileting- Clothing Manipulation and Hygiene: Maximal assistance;Sit to/from stand;Cueing for sequencing Toileting - Clothing Manipulation Details (indicate cue type and reason): Pt aware of being soiled on this date     Functional mobility during ADLs: Contact guard assist;Rolling walker (2 wheels);Cueing for sequencing;Cueing for safety;+2 for safety/equipment General ADL Comments: No physical assistance required, step by step verbal cues for sequencing    Extremity/Trunk Assessment  Vision       Perception     Praxis      Cognition Arousal: Alert Behavior During Therapy: WFL for tasks assessed/performed Overall Cognitive Status: Impaired/Different from baseline Area of Impairment: Orientation, Memory, Following commands, Safety/judgement, Problem solving                  Orientation Level: Oriented x4   Memory: Decreased short-term memory, Decreased recall of precautions Following Commands: Follows multi-step commands inconsistently Safety/Judgement: Decreased awareness of safety, Decreased awareness of deficits   Problem Solving: Slow processing, Decreased initiation, Difficulty sequencing, Requires verbal cues, Requires tactile cues General Comments: Pt highly motivated throughout session        Exercises Other Exercises Other Exercises: Edu: DME management throughout, asperation prev    Shoulder Instructions       General Comments Lines and lead intact pre/post tx, pt on 3L via [place], VSS    Pertinent Vitals/ Pain       Pain Assessment Pain Assessment: Faces Faces Pain Scale: Hurts a little bit Pain Location: chest pain Pain Descriptors / Indicators: Discomfort Pain Intervention(s): Limited activity within patient's tolerance, Monitored during session  Home Living     Available Help at Discharge:  (wife works, dtr works from home and grandson can help)               Bathroom Shower/Tub: Chief Strategy Officer: Standard Bathroom Accessibility: Yes How Accessible: Accessible via walker        Lives With: Spouse    Prior Functioning/Environment              Frequency  Min 1X/week        Progress Toward Goals  OT Goals(current goals can now be found in the care plan section)  Progress towards OT goals: Progressing toward goals  Acute Rehab OT Goals Patient Stated Goal: to get stronger OT Goal Formulation: With patient/family Time For Goal Achievement: 09/17/23 Potential to Achieve Goals: Good ADL Goals Pt Will Perform Grooming: with modified independence;standing Pt Will Perform Lower Body Dressing: sit to/from stand;with modified independence Pt Will Transfer to Toilet: ambulating;with modified independence Pt Will Perform Toileting - Clothing Manipulation and hygiene: with modified  independence;sitting/lateral leans  Plan      Co-evaluation        PT goals addressed during session: Mobility/safety with mobility;Balance;Proper use of DME OT goals addressed during session: ADL's and self-care;Strengthening/ROM      AM-PAC OT "6 Clicks" Daily Activity     Outcome Measure   Help from another person eating meals?: A Little Help from another person taking care of personal grooming?: A Little Help from another person toileting, which includes using toliet, bedpan, or urinal?: A Lot Help from another person bathing (including washing, rinsing, drying)?: A Lot Help from another person to put on and taking off regular upper body clothing?: A Little Help from another person to put on and taking off regular lower body clothing?: A Lot 6 Click Score: 15    End of Session Equipment Utilized During Treatment: Oxygen;Rolling walker (2 wheels)  OT Visit Diagnosis: Other abnormalities of gait and mobility (R26.89);Muscle weakness (generalized) (M62.81)   Activity Tolerance Patient tolerated treatment well   Patient Left in chair;with call bell/phone within reach;with nursing/sitter in room;with chair alarm set   Nurse Communication Mobility status        Time: 1451-1526 OT Time Calculation (min): 35 min  Charges: OT General Charges $OT Visit: 1 Visit OT Treatments $  Self Care/Home Management : 8-22 mins $Therapeutic Activity: 8-22 mins  Glenard Haring, OTS

## 2023-09-05 NOTE — Progress Notes (Signed)
PHARMACY CONSULT NOTE - FOLLOW UP  Pharmacy Consult for Electrolyte Monitoring and Replacement   Recent Labs:  Potassium (mmol/L)  Date Value  09/05/2023 3.4 (L)   Magnesium (mg/dL)  Date Value  03/47/4259 2.8 (H)   Calcium (mg/dL)  Date Value  56/38/7564 8.0 (L)   Albumin (g/dL)  Date Value  33/29/5188 2.5 (L)   Phosphorus (mg/dL)  Date Value  41/66/0630 2.5   Sodium (mmol/L)  Date Value  09/05/2023 148 (H)   Corrected Ca: 8.8 mg/dL  Assessment: 66 y.o. male with a past medical history of CAD s/p CABG x 4 2019, hypertension, hyperlipidemia who was brought to the ED on 08/31/2023 for witnessed cardiac arrest. Pharmacy is asked to follow and replace electrolytes while in CCU.   Goal of Therapy:  Electrolytes WNL  Plan:  K+ 3.4; will order KCl 20 mEq PO x 1 F/u electrolytes tomorrow with AM labs   Littie Deeds, PharmD Pharmacy Resident  09/05/2023 9:52 AM

## 2023-09-05 NOTE — Progress Notes (Signed)
  Inpatient Rehabilitation Admissions Coordinator   Spoke with patient and his wife by phone for rehab assessment. We discussed goals and expectations of a possible CIR admit. They prefer CIR for rehab. Wife works, but her grandson and daughter may assist in providing initially 24/7 supervision after a CIR admit. I explained that likely 24/7 supervision will be required.  I will begin insurance Auth with [facility] for possible CIR admit pending approval. Typically takes 2 to 3 days for their determination. Please call me with any questions.   Ottie Glazier, RN, MSN Rehab Admissions Coordinator [phone]

## 2023-09-05 NOTE — Progress Notes (Signed)
Progress Note   Patient: Mark Mack ZOX:[id number] DOB: 02/12/1956 DOA: 08/31/2023     5 DOS: the patient was seen and examined on 09/05/2023   Brief hospital course: 67yo with h/o HTN, cocaine abuse, and CAD s/p CABG who presented on 11/3 with witnessed cardiac arrest at home.  Code STEMI was called and he was found to have 100% occlusion of the ostial circumflex and proximal RCA with unsuccessful intervention attempts.  He suffered prolonged downtime and was intubated with concern for poor overall prognosis, made DNR.  Extubated on 11/5, currently on HFNC O2.  No longer requiring vasopressors.  Assessment and Plan:  Cardiac Arrest Known h/o CAD s/p CABG Continues to use cocaine and used PTA Presented with out of facility arrest and prolonged CPR, ROSC obtained Inferior STEMI with cardiogenic shock Unsuccessful attempts to cross ostial LCx and prox-mid RCA  Required vasopressors, now off Troponin peaked at 16K 11/4 TTE: EF of 67% Cardiology following, appreciate input Will hold asa, plavix given alveolar hemorrhage -restart once appropriate, hopefully in a day or two Recommend addition of statin once LFTs are improved Consider restarting home BP meds as BP improves (starting to improve)-   Post-arrest Metabolic Encephalopathy -  improving No longer requiring sedation 11/5 Repeat Head CT: no evidence of anoxic injury or hemorrhage 11/4 EEG: diffuse encephalopathy without evidence of seizures No longer on Keppra UDS + benzo, cocaine, cannibinoid Neuro following Speech, OT & PT following CIWA protocol Trazodone prn for insomnia Today with marked mood lability and short-term memory loss (would forget about his CPR, be reminded of it, and burst into tears - so reports this is a continuous pattern) He was planned for MRI but based on improvement at this time and the low likelihood that it will provide meaningful information to change his clinical course, will hold for now; family is  aware that this can be ordered at any time if he regresses    Acute Hypoxic and Hypercapnic Respiratory Failure Patient experienced pulmonary contusion and alveolar hemorrhage resulting from CPR No longer requiring mechanical ventilation Weaned to [facility], now on 3-5L HFNC Continue bronchodilators as needed Ongoing bloody respiratory secretions, will continue to hold anticoagulation/anti-platelet agents for now but hope to restart in a day or two 11/3 CTA ruled out pulmonary embolism 11/5 Chest CT: Lower lobe predominant consolidations and upper lobe predominant ground-glass opacities Continue zosyn for empiric coverage through 11/9 Blood cultures NTD Moved from SDU to telemetry today   Pancreatitis/Transaminitis GI prophylaxis, continue protonix 11/5 ABD/Pelvic CT: persistent peripancreatic stranding, no evidence of hemorrhage LFT elevation on presentation, likely shock liver from cardiac arrest; now improving   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 AKI due to cardiogenic shock Also with metabolic acidosis Renal function is slowly improving Acidosis has resolved, no longer requiring bicarb Avoid nephrotoxic agents and renal dose medications as needed Now with hypernatremia, will give 100 cc/hr x 10 hours   Electrolyte abnormalities Replace electrolytes as needed Electrolytes per pharmacy   Acute Anemia/Thrombocytopenia SCDs only for now Heparin, on hold for now given alveolar hemorrhage Trend CBC, all improving Consider transfusion if Hemoglobin < 7   Aspiration Pneumonia Continue Zosyn for aspiration pneumonia coverage through 11/9 SLP evaluation, recommending dysphagia 3 diet    Polysubstance abuse UDS positive for BZD, cocaine, THC Patient acknowledges periodic use at "parties" Strict cessation encouraged       Consultants: [facility] Cardiology Neurology Palliative care [facility] team PT/OT SLP CIR   Procedures: CPR 11/3 Intubation 11/3 Art line  11/3 CVC  11/3 Bronchoscopy 11/3 EEG 11/4   Antibiotics: Unasyn 11/3 Zosyn 11/3-9  30 Day Unplanned Readmission Risk Score    Flowsheet Row ED to Hosp-Admission (Current) from 08/31/2023 in [facility] ICU/CCU  30 Day Unplanned Readmission Risk Score (%) 14.7 Filed at 09/05/2023 0801       This score is the patient's risk of an unplanned readmission within 30 days of being discharged (0 -100%). The score is based on dignosis, age, lab data, medications, orders, and past utilization.   Low:  0-14.9   Medium: 15-21.9   High: 22-29.9   Extreme: 30 and above           Subjective: Having some chest wall pain. Forgets about the CPR, reminded of it, bursts into tears (over and over, per son).   Objective: Vitals:   09/04/23 1700 09/04/23 1900  BP:  (!) 152/81  Pulse:  90  Resp: (!) 48 14  Temp:  99.3 F (37.4 C)  SpO2:  94%    Intake/Output Summary (Last 24 hours) at 09/05/2023 0818 Last data filed at 09/05/2023 0300 Gross per 24 hour  Intake 1226.79 ml  Output 750 ml  Net 476.79 ml   Filed Weights   08/31/23 1213 09/01/23 0457 09/05/23 0500  Weight: 66.5 kg 72.2 kg 68.7 kg    Exam:  General:  Appears calm and comfortable and is in NAD Eyes:  EOMI, normal lids, iris ENT:  grossly normal hearing, lips & tongue, mmm Neck:  no LAD, masses or thyromegaly Cardiovascular:  RRR, no m/r/g. No LE edema.  Respiratory:   CTA bilaterally with no wheezes/rales/rhonchi.  Normal to mildly increased respiratory effort. Abdomen:  soft, NT, ND Skin:  no rash or induration seen on limited exam Musculoskeletal:  grossly normal tone BUE/BLE, good ROM, no bony abnormality Psychiatric:  blunted mood and affect, speech fluent and appropriate, AOx3 Neurologic:  CN 2-12 grossly intact, moves all extremities in coordinated fashion  Data Reviewed: I have reviewed the patient's lab results since admission.  Pertinent labs for today include:   Na++ 148 K+ 3.4 Glucose 105 BUN  31/Creatinine 1.57/GFR 48 - improving Albumin 2.9 AST 103/ALT 75/Bili 1.3 - improving WBC 16.8 - improving Hgb 11.2 - improving Platelets 115 - improving   Family Communication: Son was present throughout evaluation  Disposition: Status is: Inpatient Remains inpatient appropriate because: ongoing evaluation and management     Time spent: 50 minutes  Unresulted Labs (From admission, onward)     Start     Ordered   09/05/23 0500  Basic metabolic panel  Tomorrow morning,   R       Question:  Specimen collection method  Answer:  Unit=Unit collect   09/04/23 0710   09/05/23 0500  Phosphorus  Daily,   R     Question:  Specimen collection method  Answer:  Unit=Unit collect   09/04/23 1223   09/05/23 0500  Magnesium  Daily,   R     Question:  Specimen collection method  Answer:  Unit=Unit collect   09/04/23 1223   09/05/23 0500  CBC with Differential/Platelet  Tomorrow morning,   R       Question:  Specimen collection method  Answer:  Unit=Unit collect   09/04/23 1610             Author: Jonah Blue, MD 09/05/2023 8:18 AM  For on call review [email or web address].

## 2023-09-05 NOTE — Progress Notes (Signed)
Speech Language Pathology Treatment: Dysphagia  Patient Details Name: Mark Mack MRN: [id number] DOB: 07-12-1956 Today's Date: 09/05/2023 Time: 8295-6213 SLP Time Calculation (min) (ACUTE ONLY): 23 min  Assessment / Plan / Recommendation Clinical Impression  Pt seen for ongoing assessment of swallowing and education on general aspiration precautions. NSG and pt both endorsed eating/drinking w/out difficulty since yesterday PM. Pt awake, verbal and able to engage in short communication w/ this SLP and PTA, NSG/MD(CIR) w/out much increased WOB from the exertion of talking. He responded to questions re: self, environment and followed commands w/ MD(CIR). Much improved conversation engagement during conversation. Noted RR in low20s. Pt is currently on [place] 3L. Less overt WOB vs yesterday noted.  Afebrile. WBC trending down now.  Pt explained general aspiration precautions and agreed verbally to the need for following them especially sitting upright for all oral intake. He repeated back other precautions: Small sips; drink Slowly; No straws. Pt assisted w/ positioning in chair then trials of thin liquids via cup. No overt clinical s/s of aspiration were noted; respiratory status remained unlabored, vocal quality clear b/t trials, no coughing. NSG reported pt requested to swallow Pills w/ Water (thin liquid) today which he tolerated well taking ONE PILL AT A TIME. Recommend use of puree for increased viscosity if needed. Oral phase appeared [facility] for bolus management and timely A-P transfer for swallowing; oral clearing achieved w/ all consistencies. NSG denied any deficits in swallowing w/ pt today.   Pt appears at reduced risk for aspriation when following general aspiration precautions w/ a slightly modified diet for conservation of energy at this time during Rehab/recovery. Recommend continue a more mech soft diet(dys 3) for ease of soft foods for mastication w/ gravies added to moisten foods; Thin liquids  via Cup. Recommend general aspiration precautions as discussed and practiced during session; Pills Whole vs Crushed in Puree; tray setup and positioning assistance for meals. Rest breaks during meals and reduced talking as needed.  ST services will sign off at this time; MD to reconsult if new needs arise during admit. Precautions posted at bedside.  Recommend ST services f/u w/ cognitive-linguistic evaluation at next venue of care if any suspected cognitive deficits s/p cardiac arrest/CPR event to address any identified cognitive-communication needs in ADLs. NSG and MD(CIR) updated, agreed.       HPI HPI: Patient is a 67 year old male presenting with cardiac arrest at home, CPR initiated at home prior to EMS. Found to have inferior STEMI s/p cath lab for [facility]. Patient was intubated ~2.5 days and was extubated on 09/02/2023.  Patient had a witnessed cardiac arrest at home and EMS was activated. CPR initiated at home prior to EMS, which continued, with ROSC achieved. He had multiple rounds of CPR secondary to continued loss of pulses.  CXR: Persistent but improving bilateral airspace disease, consistent  with improving edema or pneumonia.  Head CT: No acute intracranial abnormality, no CT evidence of anoxic injury.  Advanced cerebral white matter changes.      SLP Plan  All goals met      Recommendations for follow up therapy are one component of a multi-disciplinary discharge planning process, led by the attending physician.  Recommendations may be updated based on patient status, additional functional criteria and insurance authorization.    Recommendations  Diet recommendations: Dysphagia 3 (mechanical soft);Thin liquid (for conservation of energy) Liquids provided via: Cup;No straw Medication Administration: Whole meds with puree Supervision: Patient able to self feed;Intermittent supervision to cue for compensatory  strategies (setup) Compensations: Minimize environmental distractions;Slow  rate;Small sips/bites;Lingual sweep for clearance of pocketing;Follow solids with liquid Postural Changes and/or Swallow Maneuvers: Out of bed for meals;Seated upright 90 degrees;Upright 30-60 min after meal                 (Dietician f/u) Oral care BID;Patient independent with oral care (support)   Set up Supervision/Assistance Dysphagia, unspecified (R13.10)     All goals met       Mark Som, MS, CCC-SLP Speech Language Pathologist Rehab Services; [facility] [phone] (ascom) [place]  09/05/2023, 3:33 PM

## 2023-09-05 NOTE — Progress Notes (Signed)
Inpatient Rehabilitation Admissions Coordinator   Rehab consult received. Dr Carlis Abbott to assess today and then I will follow up.  Ottie Glazier, RN, MSN Rehab Admissions Coordinator [phone] 09/05/2023 12:12 PM

## 2023-09-05 NOTE — Progress Notes (Signed)
       CROSS COVER NOTE  NAME: Mark Mack MRN: [id number] DOB : 07-12-1956    Concern as stated by nurse / staff   Message received from Maricar RN Hi Shelena Castelluccio pt wife is at the bedside and would like to talk to you in person to change pt code from DNR to Full code. Pt was admitted for vardiac arrest. thanks     Pertinent findings on chart review:   Assessment and  Interventions   Assessment: Discussion with patient and wife.  Wife stated when that decision was made the patient had very grim prognosis and he has greatly improved since  Plan: Code status changed to full code       Donnie Mesa NP [facility] Cross Cover 7pm-7am - check amion for availability Pager [phone]

## 2023-09-05 NOTE — Plan of Care (Signed)

## 2023-09-05 NOTE — Progress Notes (Signed)
Daily Progress Note   Patient Name: Mark Mack       Date: 09/05/2023 DOB: 1956-10-04  Age: 67 y.o. MRN#: [id number] Attending Physician: Jonah Blue, MD Primary Care Physician: Elane Fritz, MD Admit Date: 08/31/2023  Reason for Consultation/Follow-up: Establishing goals of care  Subjective: Notes and labs reviewed. In to see patient.  He is currently sitting in bedside chair watching television.  He tells me that at baseline he is very active and enjoys golf and exercise.  He discusses that he "partied and overdid it".  He quickly tells me that he has learned his lesson and will not use any more recreational substances or alcohol.  He tells me of his faith in [place], and his understanding that he is truly blessed to be alive at this time.  Began to discuss goals of care.  His wife entered conversation.  Patient states he is considering changing his CODE STATUS back to full code/full scope.  With conversation his phone rang and he and his wife needed to take the phone call.  Will follow back up tomorrow.   Length of Stay: 5  Current Medications: Scheduled Meds:   Chlorhexidine Gluconate Cloth  6 each Topical Q0600   feeding supplement (KATE FARMS STANDARD 1.4)  325 mL Oral TID BM   folic acid  1 mg Oral Daily   insulin aspart  0-6 Units Subcutaneous Q4H   losartan  25 mg Oral Daily   metoprolol succinate  12.5 mg Oral Daily   multivitamin with minerals  1 tablet Oral Daily   pantoprazole  40 mg Oral QHS   thiamine  100 mg Oral Daily   traZODone  50 mg Oral QHS    Continuous Infusions:  lactated ringers 100 mL/hr at 09/05/23 1522   piperacillin-tazobactam (ZOSYN)  IV 3.375 g (09/05/23 1405)    PRN Meds: docusate sodium, LORazepam, nitroGLYCERIN, ondansetron (ZOFRAN) IV,  mouth rinse, polyethylene glycol, sodium chloride  Physical Exam Pulmonary:     Effort: Pulmonary effort is normal.  Neurological:     Mental Status: He is alert.            Vital Signs: BP 139/89   Pulse 90   Temp 98.6 F (37 C) (Oral)   Resp (!) 38   Ht 5\' 6"  (1.676 m)   Wt  68.7 kg   SpO2 98%   BMI 24.45 kg/m  SpO2: SpO2: 98 % O2 Device: O2 Device: High Flow Nasal Cannula O2 Flow Rate: O2 Flow Rate (L/min): 3 L/min  Intake/output summary:  Intake/Output Summary (Last 24 hours) at 09/05/2023 1546 Last data filed at 09/05/2023 1500 Gross per 24 hour  Intake 1260.04 ml  Output 1950 ml  Net -689.96 ml   LBM: Last BM Date : 09/05/23 Baseline Weight: Weight: 66.5 kg Most recent weight: Weight: 68.7 kg   Patient Active Problem List   Diagnosis Date Noted   Cardiac arrest (HCC) 08/31/2023   STEMI (ST elevation myocardial infarction) (HCC) 08/31/2023   Chest pain 04/03/2018   Coronary artery disease of native artery of native heart with stable angina pectoris (HCC) 02/14/2018   History of total right hip replacement 10/02/2012   Primary hypertension 12/06/2008   HLA B27 positive 09/21/2008   Hypercholesterolemia 04/17/2005   Arthropathy of pelvic region and thigh 09/15/2000   Esophageal reflux 09/15/2000    Palliative Care Assessment & Plan   Recommendations/Plan: Working on rehab placement.  Code Status:    Code Status Orders  (From admission, onward)           Start     Ordered   08/31/23 1320  Do not attempt resuscitation (DNR)- Limited -Do Not Intubate (DNI)  (Code Status)  Continuous       Question Answer Comment  If pulseless and not breathing No CPR or chest compressions.   In Pre-Arrest Conditions (Patient Is Breathing and Has A Pulse) Do not intubate. Provide all appropriate non-invasive medical interventions. Avoid ICU transfer unless indicated or required.   Consent: Discussion documented in EHR or advanced directives reviewed      08/31/23  1320           Code Status History     Date Active Date Inactive Code Status Order ID Comments User Context   08/31/2023 1000 08/31/2023 1320 Full Code [id number]  [name], MD ED   04/03/2018 0801 04/03/2018 1735 Full Code [id number]  Barbaraann Rondo, MD Inpatient      Thank you for allowing the Palliative Medicine Team to assist in the care of this patient.    Morton Stall, NP  Please contact Palliative Medicine Team phone at [phone] for questions and concerns.

## 2023-09-05 NOTE — Plan of Care (Signed)

## 2023-09-05 NOTE — Progress Notes (Signed)
[facility] CLINIC CARDIOLOGY PROGRESS NOTE   Patient ID: Mark Mack MRN: [id number] DOB/AGE: [birth year] 66 y.o.  Admit date: 08/31/2023 Primary Physician Hillery Hunter, Graylin Shiver, MD  Primary Cardiologist Dr. Hessie Dibble [facility] - last seen 2019)  HPI: Shravan Gautreau is a 67 y.o. male with a past medical history of CAD s/p CABG x 4 2019, hypertension, hyperlipidemia who was brought to the ED on 08/31/2023 for witnessed cardiac arrest. When ROSC was obtained there was concern for inferolateral STEMI and patient was taken emergently to the cath lab.   Interval History:  -Patient continues to report improvement. Eager to work with PT again today. -BP remains elevated. Tolerating metoprolol well with controlled HR and decreased incidence of PVCs.  -Reports SOB improving. Denies any chest pain.   Review of systems complete and found to be negative unless listed above    Vitals:   09/05/23 0800 09/05/23 0900 09/05/23 0937 09/05/23 1000  BP: (!) 148/77 (!) 150/69 (!) 150/69 (!) 152/85  Pulse:      Resp: (!) 37 (!) 46 (!) 32 (!) 48  Temp:  98.6 F (37 C)    TempSrc:  Oral    SpO2:  98%    Weight:      Height:         Intake/Output Summary (Last 24 hours) at 09/05/2023 1146 Last data filed at 09/05/2023 1126 Gross per 24 hour  Intake 1740.04 ml  Output 1550 ml  Net 190.04 ml     PHYSICAL EXAM General: Ill appearing, well-nourished, no acute distress. Sitting upright in bedside chair.  HEENT: Normocephalic and atraumatic. Neck: No JVD.  Lungs: Diminished bilaterally. Normal effort on 3L HFNC. Heart: HRRR. Normal S1 and S2 without gallops or murmurs. Radial & DP pulses 2+ bilaterally. Abdomen: Non-distended appearing.  Msk: Normal strength and tone for age. Extremities: No clubbing, cyanosis or edema.     LABS: Basic Metabolic Panel: Recent Labs    09/04/23 0304 09/05/23 0920  NA 144 148*  K 3.0* 3.4*  CL 111 115*  CO2 28 25  GLUCOSE 95 105*  BUN 38* 31*  CREATININE 1.95* 1.57*   CALCIUM 7.6* 8.0*  MG 2.9* 2.8*  PHOS 3.1 2.5   Liver Function Tests: Recent Labs    09/05/23 0914  AST 103*  ALT 75*  ALKPHOS 84  BILITOT 1.3*  PROT 6.3*  ALBUMIN 2.9*    No results for input(s): "LIPASE", "AMYLASE" in the last 72 hours.  CBC: Recent Labs    09/04/23 0304 09/05/23 0920  WBC 20.7* 16.8*  NEUTROABS  --  13.0*  HGB 10.1* 11.2*  HCT 30.0* 34.2*  MCV 82.6 86.1  PLT 104* 115*   Cardiac Enzymes: No results for input(s): "CKTOTAL", "CKMB", "CKMBINDEX", "TROPONINIHS" in the last 72 hours.  BNP: No results for input(s): "BNP" in the last 72 hours. D-Dimer: No results for input(s): "DDIMER" in the last 72 hours. Hemoglobin A1C: No results for input(s): "HGBA1C" in the last 72 hours.  Fasting Lipid Panel: No results for input(s): "CHOL", "HDL", "LDLCALC", "TRIG", "CHOLHDL", "LDLDIRECT" in the last 72 hours.  Thyroid Function Tests: No results for input(s): "TSH", "T4TOTAL", "T3FREE", "THYROIDAB" in the last 72 hours.  Invalid input(s): "FREET3" Anemia Panel: No results for input(s): "VITAMINB12", "FOLATE", "FERRITIN", "TIBC", "IRON", "RETICCTPCT" in the last 72 hours.  No results found.   ECHO as above  TELEMETRY reviewed by me 09/05/23: sinus rhythm rate 80s, PVCs  EKG reviewed by me 09/05/23: sinus rhythm rate 80 bpm  DATA  reviewed by me 09/05/23: last 24h vitals tele labs imaging I/O, PCCM progress note, neurology note  Principal Problem:   Cardiac arrest [facility]) Active Problems:   STEMI (ST elevation myocardial infarction) (HCC)    ASSESSMENT AND PLAN: Elvy Budny is a 67 y.o. male with a past medical history of CAD s/p CABG x 4 2019, hypertension, hyperlipidemia who was brought to the ED on 08/31/2023 for witnessed cardiac arrest. When ROSC was obtained there was concern for inferolateral STEMI and patient was taken emergently to the cath lab.   # Cardiac arrest # STEMI # Coronary artery disease s/p CABG x4 2019 Patient with witnessed  cardiac arrest, ROSC obtained and EKG concerning for inferolateral STEMI. Patient taken urgently to cath lab 11/3, unsuccessful attempts to cross ostial LCx and prox-mid RCA lesions. PCWP of 25 mmHg. Required multiple vasopressors, now weaned completed. Echo this admission demonstrated preserved EF.  -Start losartan 25 mg daily for BP control. Continue metoprolol succinate 12.5 mg daily.  -Will hold asa, plavix given alveolar hemorrhage. Consider starting tomorrow if Hgb stable.  -Recommend addition of statin likely on discharge as LFTs improve.   # Acute Hypoxic and Hypercapnic Respiratory Failure # Pulmonary contusion # Bland alveolar hemorrhage Bronch done 11/3 demonstrated alveolar hemorrhage likely 2/2 pulmonary contusion. Extubated 09/02/23. -Management per PCCM.   #AKI on CKD #Metabolic acidosis Cr 1.24 on admission, 1.57 on AM labs today. -Continue to monitor renal function closely.    This patient's case was discussed and created with Dr. Melton Alar and she is in agreement.  Signed:  [name], PA-C  09/05/2023, 11:46 AM [facility] Cardiology

## 2023-09-05 NOTE — Consult Note (Signed)
Physical Medicine and Rehabilitation Consult Reason for Consult: Cardiac debility Referring Physician: Jonah Blue, MD   HPI: Mark Mack is a 67 y.o. male who presented with a cardiac arrest at home. CPR was initiated at home prior to EMS. He was found to have an inferior STEMI s/o cath lab for [facility]. Patient was intubated and extubated on 11/5. He is currently ambulating with CG +2 20 feet but is limited by chest pain and hemoptysis. Physical Medicine & Rehabilitation was consulted to assess candidacy for CIR.     ROS +chest pain, hemoptysis Past Medical History:  Diagnosis Date   Hypertension    Past Surgical History:  Procedure Laterality Date   CARDIAC SURGERY     CORONARY/GRAFT ACUTE MI REVASCULARIZATION N/A 08/31/2023   Procedure: Coronary/Graft Acute MI Revascularization;  Surgeon: Alwyn Pea, MD;  Location: [facility] INVASIVE CV LAB;  Service: Cardiovascular;  Laterality: N/A;   LEFT HEART CATH AND CORONARY ANGIOGRAPHY N/A 08/31/2023   Procedure: LEFT HEART CATH AND CORONARY ANGIOGRAPHY;  Surgeon: Alwyn Pea, MD;  Location: [facility] INVASIVE CV LAB;  Service: Cardiovascular;  Laterality: N/A;   No family history on file. Social History:  reports that he quit smoking about 5 years ago. He has never used smokeless tobacco. No history on file for alcohol use and drug use. Allergies: No Known Allergies Medications Prior to Admission  Medication Sig Dispense Refill   acetaminophen (TYLENOL) 500 MG tablet Take 1,000 mg by mouth every 6 (six) hours as needed.      Telmisartan-amLODIPine 80-10 MG TABS Take 1 tablet by mouth daily.     metoprolol succinate (TOPROL XL) 50 MG 24 hr tablet Take 50 mg by mouth daily.       Home: Home Living Family/patient expects to be discharged to:: Private residence Living Arrangements: Spouse/significant other Available Help at Discharge: Family, Available 24 hours/day Type of Home: House Home Access: Stairs to enter Water quality scientist of Steps: 2 Home Layout: One level  Functional History: Prior Function Prior Level of Function : Independent/Modified Independent Mobility Comments: retired, independent and active (was working on a car recently) ADLs Comments: independent Functional Status:  Mobility: Bed Mobility Overal bed mobility: Needs Assistance Bed Mobility: Supine to Sit Supine to sit: Min assist, Mod assist, HOB elevated, Used rails Sit to supine: Mod assist General bed mobility comments: in recliner pre/post session Transfers Overall transfer level: Needs assistance Equipment used: Rolling walker (2 wheels) Transfers: Sit to/from Stand Sit to Stand: Contact guard assist, Min assist General transfer comment: pt requires CGA-MIN assist to stand from recliner with vcs for handplacement, fwd wt shift, and overall sequencing improvements Ambulation/Gait Ambulation/Gait assistance: Contact guard assist, +2 safety/equipment Gait Distance (Feet): 30 Feet Assistive device: Rolling walker (2 wheels) Gait Pattern/deviations: Step-through pattern, Trunk flexed General Gait Details: pt ambulated on 2 L o2  2 x ~ 30 ft with chair follow for additional safety. Pt has one episode of coughing up small amount of blood but per SLP has been since admission. Gait velocity: decreased Pre-gait activities: Mod A +2 person for weight shifting and marching in place. Standing activity tolerance limited for progression of ambulation    ADL: ADL Overall ADL's : Needs assistance/impaired Grooming: Wash/dry face, Wash/dry hands, Minimal assistance, Sitting (using oral suction) Grooming Details (indicate cue type and reason): vcs for completion Lower Body Dressing: Maximal assistance, Bed level Lower Body Dressing Details (indicate cue type and reason): socks Toileting- Clothing Manipulation and Hygiene: Maximal assistance,  Sit to/from stand, Cueing for sequencing Toileting - Clothing Manipulation Details (indicate  cue type and reason): Pt soiled on arrival to room, pt unaware  Cognition: Cognition Overall Cognitive Status: Impaired/Different from baseline Orientation Level: Oriented X4 Cognition Arousal: Alert Behavior During Therapy: WFL for tasks assessed/performed Overall Cognitive Status: Impaired/Different from baseline Area of Impairment: Orientation, Memory, Following commands, Safety/judgement, Problem solving Orientation Level: Situation, Time, Disoriented to Memory: Decreased short-term memory, Decreased recall of precautions Following Commands: Follows one step commands consistently, Follows multi-step commands inconsistently Safety/Judgement: Decreased awareness of safety, Decreased awareness of deficits Problem Solving: Slow processing, Decreased initiation, Difficulty sequencing, Requires verbal cues, Requires tactile cues General Comments: Pt eager to participate in session, motivated to get better.  Blood pressure (!) 141/80, pulse 90, temperature 98.6 F (37 C), temperature source Oral, resp. rate (!) 48, height 5\' 6"  (1.676 m), weight 68.7 kg, SpO2 98%. Physical Exam Gen: no distress, normal appearing HEENT: oral mucosa pink and moist, NCAT, [place] in place Cardio: Reg rate Chest: increased effort of breathing Abd: soft, non-distended Ext: no edema Psych: pleasant, normal affect Skin: intact Neuro: Alert and oriented x3 Musculoskeletal: 5/5 strength  Results for orders placed or performed during the hospital encounter of 08/31/23 (from the past 24 hour(s))  Glucose, capillary     Status: Abnormal   Collection Time: 09/04/23  4:28 PM  Result Value Ref Range   Glucose-Capillary 173 (H) 70 - 99 mg/dL  Glucose, capillary     Status: None   Collection Time: 09/04/23  7:33 PM  Result Value Ref Range   Glucose-Capillary 96 70 - 99 mg/dL  Glucose, capillary     Status: Abnormal   Collection Time: 09/04/23 11:45 PM  Result Value Ref Range   Glucose-Capillary 108 (H) 70 - 99  mg/dL  Glucose, capillary     Status: Abnormal   Collection Time: 09/05/23  3:58 AM  Result Value Ref Range   Glucose-Capillary 190 (H) 70 - 99 mg/dL  Glucose, capillary     Status: Abnormal   Collection Time: 09/05/23  7:41 AM  Result Value Ref Range   Glucose-Capillary 118 (H) 70 - 99 mg/dL  Hepatic function panel     Status: Abnormal   Collection Time: 09/05/23  9:14 AM  Result Value Ref Range   Total Protein 6.3 (L) 6.5 - 8.1 g/dL   Albumin 2.9 (L) 3.5 - 5.0 g/dL   AST 409 (H) 15 - 41 U/L   ALT 75 (H) 0 - 44 U/L   Alkaline Phosphatase 84 38 - 126 U/L   Total Bilirubin 1.3 (H) <1.2 mg/dL   Bilirubin, Direct 0.3 (H) 0.0 - 0.2 mg/dL   Indirect Bilirubin 1.0 (H) 0.3 - 0.9 mg/dL  Basic metabolic panel     Status: Abnormal   Collection Time: 09/05/23  9:20 AM  Result Value Ref Range   Sodium 148 (H) 135 - 145 mmol/L   Potassium 3.4 (L) 3.5 - 5.1 mmol/L   Chloride 115 (H) 98 - 111 mmol/L   CO2 25 22 - 32 mmol/L   Glucose, Bld 105 (H) 70 - 99 mg/dL   BUN 31 (H) 8 - 23 mg/dL   Creatinine, Ser 8.11 (H) 0.61 - 1.24 mg/dL   Calcium 8.0 (L) 8.9 - 10.3 mg/dL   GFR, Estimated 48 (L) >60 mL/min   Anion gap 8 5 - 15  Phosphorus     Status: None   Collection Time: 09/05/23  9:20 AM  Result Value Ref Range  Phosphorus 2.5 2.5 - 4.6 mg/dL  Magnesium     Status: Abnormal   Collection Time: 09/05/23  9:20 AM  Result Value Ref Range   Magnesium 2.8 (H) 1.7 - 2.4 mg/dL  CBC with Differential/Platelet     Status: Abnormal   Collection Time: 09/05/23  9:20 AM  Result Value Ref Range   WBC 16.8 (H) 4.0 - 10.5 K/uL   RBC 3.97 (L) 4.22 - 5.81 MIL/uL   Hemoglobin 11.2 (L) 13.0 - 17.0 g/dL   HCT 16.1 (L) 09.6 - 04.5 %   MCV 86.1 80.0 - 100.0 fL   MCH 28.2 26.0 - 34.0 pg   MCHC 32.7 30.0 - 36.0 g/dL   RDW 40.9 81.1 - 91.4 %   Platelets 115 (L) 150 - 400 K/uL   nRBC 3.4 (H) 0.0 - 0.2 %   Neutrophils Relative % 77 %   Neutro Abs 13.0 (H) 1.7 - 7.7 K/uL   Lymphocytes Relative 10 %   Lymphs  Abs 1.6 0.7 - 4.0 K/uL   Monocytes Relative 10 %   Monocytes Absolute 1.6 (H) 0.1 - 1.0 K/uL   Eosinophils Relative 0 %   Eosinophils Absolute 0.0 0.0 - 0.5 K/uL   Basophils Relative 0 %   Basophils Absolute 0.1 0.0 - 0.1 K/uL   WBC Morphology MORPHOLOGY UNREMARKABLE    Smear Review Normal platelet morphology    Immature Granulocytes 3 %   Abs Immature Granulocytes 0.44 (H) 0.00 - 0.07 K/uL   Polychromasia PRESENT   Glucose, capillary     Status: Abnormal   Collection Time: 09/05/23 11:44 AM  Result Value Ref Range   Glucose-Capillary 137 (H) 70 - 99 mg/dL   No results found.  Assessment/Plan: Diagnosis: Cardiac debility Does the need for close, 24 hr/day medical supervision in concert with the patient's rehab needs make it unreasonable for this patient to be served in a less intensive setting? Yes Co-Morbidities requiring supervision/potential complications:   1) Hemoptysis  2) Chest pain  3) HTN: continue to monitor BP TID  4) GERD  5) HLD  6) insomnia: continue trazodone  7) Tachypnea: continue to monitor RR TID  8) Chest pain: messaged Dr. Ophelia Charter to see if prn nitroglycerin could be helpful Due to bladder management, bowel management, safety, skin/wound care, disease management, medication administration, pain management, and patient education, does the patient require 24 hr/day rehab nursing? Yes Does the patient require coordinated care of a physician, rehab nurse, therapy disciplines of PT, OT to address physical and functional deficits in the context of the above medical diagnosis(es)? Yes Addressing deficits in the following areas: balance, endurance, locomotion, strength, transferring, bowel/bladder control, bathing, dressing, feeding, grooming, and toileting Can the patient actively participate in an intensive therapy program of at least 3 hrs of therapy per day at least 5 days per week? Yes The potential for patient to make measurable gains while on inpatient rehab is  excellent Anticipated functional outcomes upon discharge from inpatient rehab are modified independent  with PT, modified independent with OT, independent with SLP. Estimated rehab length of stay to reach the above functional goals is: 5-7 days Anticipated discharge destination: Home Overall Rehab/Functional Prognosis: excellent  POST ACUTE RECOMMENDATIONS: This patient's condition is appropriate for continued rehabilitative care in the following setting: CIR Patient has agreed to participate in recommended program. Yes Note that insurance prior authorization may be required for reimbursement for recommended care.    I have personally performed a face to face diagnostic evaluation  of this patient. Additionally, I have examined the patient's medical record including any pertinent labs and radiographic images. If the physician assistant has documented in this note, I have reviewed and edited or otherwise concur with the physician assistant's documentation.  Thanks,  Horton Chin, MD 09/05/2023

## 2023-09-06 DIAGNOSIS — I469 Cardiac arrest, cause unspecified: Secondary | ICD-10-CM | POA: Diagnosis not present

## 2023-09-06 LAB — BASIC METABOLIC PANEL
Anion gap: 8 (ref 5–15)
BUN: 29 mg/dL — ABNORMAL HIGH (ref 8–23)
CO2: 22 mmol/L (ref 22–32)
Calcium: 8 mg/dL — ABNORMAL LOW (ref 8.9–10.3)
Chloride: 114 mmol/L — ABNORMAL HIGH (ref 98–111)
Creatinine, Ser: 1.36 mg/dL — ABNORMAL HIGH (ref 0.61–1.24)
GFR, Estimated: 57 mL/min — ABNORMAL LOW (ref >60)
Glucose, Bld: 113 mg/dL — ABNORMAL HIGH (ref 70–99)
Potassium: 3.6 mmol/L (ref 3.5–5.1)
Sodium: 144 mmol/L (ref 135–145)

## 2023-09-06 LAB — GLUCOSE, CAPILLARY
Glucose-Capillary: 100 mg/dL — ABNORMAL HIGH (ref 70–99)
Glucose-Capillary: 107 mg/dL — ABNORMAL HIGH (ref 70–99)
Glucose-Capillary: 107 mg/dL — ABNORMAL HIGH (ref 70–99)
Glucose-Capillary: 89 mg/dL (ref 70–99)
Glucose-Capillary: 92 mg/dL (ref 70–99)
Glucose-Capillary: 94 mg/dL (ref 70–99)

## 2023-09-06 LAB — PHOSPHORUS: Phosphorus: 2.6 mg/dL (ref 2.5–4.6)

## 2023-09-06 LAB — MAGNESIUM: Magnesium: 2.4 mg/dL (ref 1.7–2.4)

## 2023-09-06 MED ORDER — POTASSIUM CHLORIDE CRYS ER 20 MEQ PO TBCR
20.0000 meq | EXTENDED_RELEASE_TABLET | Freq: Once | ORAL | Status: AC
  Administered 2023-09-06: 20 meq via ORAL
  Filled 2023-09-06: qty 1

## 2023-09-06 MED ORDER — QUETIAPINE FUMARATE 25 MG PO TABS
25.0000 mg | ORAL_TABLET | Freq: Every day | ORAL | Status: DC
  Administered 2023-09-06 – 2023-09-07 (×2): 25 mg via ORAL
  Filled 2023-09-06 (×2): qty 1

## 2023-09-06 NOTE — Progress Notes (Signed)
Progress Note   Patient: Mark Mack ZDG:[id number] DOB: 1956/03/21 DOA: 08/31/2023     6 DOS: the patient was seen and examined on 09/06/2023   Brief hospital course: 67yo with h/o HTN, cocaine abuse, and CAD s/p CABG who presented on 11/3 with witnessed cardiac arrest at home.  Code STEMI was called and he was found to have 100% occlusion of the ostial circumflex and proximal RCA with unsuccessful intervention attempts.  He suffered prolonged downtime and was intubated with concern for poor overall prognosis, made DNR.  Extubated on 11/5, currently on HFNC O2.  No longer requiring vasopressors.  Assessment and Plan:  Cardiac Arrest Known h/o CAD s/p CABG Continues to use cocaine and used PTA Presented with out of facility arrest and prolonged CPR, ROSC obtained Inferior STEMI with cardiogenic shock Unsuccessful attempts to cross ostial LCx and prox-mid RCA  Required vasopressors, now off Troponin peaked at 16K 11/4 TTE: EF of 67% Cardiology following, appreciate input Will hold asa, plavix given alveolar hemorrhage -restart once appropriate, hopefully in a day or two Recommend addition of statin once LFTs are improved Consider restarting home BP meds as BP improves (starting to improve) Code status changed back to full code per family on 11/8   Post-arrest Metabolic Encephalopathy -  improving No longer requiring sedation 11/5 Repeat Head CT: no evidence of anoxic injury or hemorrhage 11/4 EEG: diffuse encephalopathy without evidence of seizures No longer on Keppra UDS + benzo, cocaine, cannibinoid Neuro following Speech, OT & PT following CIWA protocol Trazodone prn for insomnia Today with marked mood lability and short-term memory loss (would forget about his CPR, be reminded of it, and burst into tears - so reports this is a continuous pattern) He was planned for MRI but based on improvement at this time and the low likelihood that it will provide meaningful information to  change his clinical course, will hold for now; family is aware that this can be ordered at any time if he regresses He experienced delirium overnight on 11/8, probably out of the window for DTs so will start nightly Seroquel for now for hospital-associated delirium in conjunction with post-arrest encephalopathy    Acute Hypoxic and Hypercapnic Respiratory Failure Patient experienced pulmonary contusion and alveolar hemorrhage resulting from CPR No longer requiring mechanical ventilation Weaned to [facility], now on 3-5L HFNC Continue bronchodilators as needed Ongoing bloody respiratory secretions, will continue to hold anticoagulation/anti-platelet agents for now but hope to restart in a day or two 11/3 CTA ruled out pulmonary embolism 11/5 Chest CT: Lower lobe predominant consolidations and upper lobe predominant ground-glass opacities Continue zosyn for empiric coverage through 11/9 Blood cultures NTD Moved from ICU -> SDU -> telemetry   Pancreatitis/Transaminitis GI prophylaxis, continue protonix 11/5 ABD/Pelvic CT: persistent peripancreatic stranding, no evidence of hemorrhage LFT elevation on presentation, likely shock liver from cardiac arrest; now improving  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 AKI due to cardiogenic shock Also with metabolic acidosis Renal function is slowly improving, back to relative baseline Acidosis has resolved, no longer requiring bicarb Avoid nephrotoxic agents and renal dose medications as needed   Electrolyte abnormalities Replace electrolytes as needed Electrolytes per pharmacy   Acute Anemia/Thrombocytopenia SCDs only for now Heparin, on hold for now given alveolar hemorrhage Trend CBC, all improving Consider transfusion if Hemoglobin < 7   Aspiration Pneumonia Continue Zosyn for aspiration pneumonia coverage through 11/9 SLP evaluation, recommending dysphagia 3 diet    Polysubstance abuse UDS positive for BZD, cocaine, THC Patient acknowledges  periodic use at "parties" Strict cessation encouraged       Consultants: [facility] Cardiology Neurology Palliative care [facility] team PT/OT SLP CIR   Procedures: CPR 11/3 Intubation 11/3 Art line 11/3 CVC 11/3 Bronchoscopy 11/3 EEG 11/4   Antibiotics: Unasyn 11/3 Zosyn 11/3-9  30 Day Unplanned Readmission Risk Score    Flowsheet Row ED to Hosp-Admission (Current) from 08/31/2023 in [facility] PCU  30 Day Unplanned Readmission Risk Score (%) 15.08 Filed at 09/06/2023 0800       This score is the patient's risk of an unplanned readmission within 30 days of being discharged (0 -100%). The score is based on dignosis, age, lab data, medications, orders, and past utilization.   Low:  0-14.9   Medium: 15-21.9   High: 22-29.9   Extreme: 30 and above           Subjective: Had overnight delirium, remains emotionally labile.  Motivated to change lifestyle and get better.  Reports willingness to go to CIR on Monday.   Objective: Vitals:   09/06/23 0425 09/06/23 0837  BP: (!) 158/84 139/77  Pulse: 83 82  Resp: 18   Temp: 99.1 F (37.3 C) 98.1 F (36.7 C)  SpO2: 92% 98%    Intake/Output Summary (Last 24 hours) at 09/06/2023 1337 Last data filed at 09/06/2023 1043 Gross per 24 hour  Intake 1242.33 ml  Output 1300 ml  Net -57.67 ml   Filed Weights   08/31/23 1213 09/01/23 0457 09/05/23 0500  Weight: 66.5 kg 72.2 kg 68.7 kg    Exam:  General:  Appears calm and comfortable and is in NAD Eyes:  EOMI, normal lids, iris ENT:  grossly normal hearing, lips & tongue, mmm Neck:  no LAD, masses or thyromegaly Cardiovascular:  RRR, no m/r/g. No LE edema.  Respiratory:   CTA bilaterally with no wheezes/rales/rhonchi.  Normal respiratory effort.  Remains on 8L HFNC O2. Abdomen:  soft, NT, ND Skin:  no rash or induration seen on limited exam Musculoskeletal:  grossly normal tone BUE/BLE, good ROM, no bony abnormality Psychiatric:  eccentric/labile mood and  affect, speech fluent and appropriate, AOx3 Neurologic:  CN 2-12 grossly intact, moves all extremities in coordinated fashion  Data Reviewed: I have reviewed the patient's lab results since admission.  Pertinent labs for today include:   Glucose 113 BUN 29/Creatinine 1.36/GFR 57 - improving     Family Communication: Wife was present throughout evaluation  Disposition: Status is: Inpatient Remains inpatient appropriate because: further evaluation and management   *Patient has been recommended for CIR and accepted *Awaiting insurance approval as of 11/8 *Possible dc to CIR on 11/11     Time spent: 50 minutes  Unresulted Labs (From admission, onward)     Start     Ordered   09/07/23 0500  CBC with Differential/Platelet  Tomorrow morning,   R       Question:  Specimen collection method  Answer:  Unit=Unit collect   09/06/23 0816   09/07/23 0500  Comprehensive metabolic panel  Tomorrow morning,   R       Question:  Specimen collection method  Answer:  Unit=Unit collect   09/06/23 1334   09/05/23 0500  Phosphorus  Daily,   R     Question:  Specimen collection method  Answer:  Unit=Unit collect   09/04/23 1223   09/05/23 0500  Magnesium  Daily,   R     Question:  Specimen collection method  Answer:  Unit=Unit collect  09/04/23 1223             Author: Jonah Blue, MD 09/06/2023 1:37 PM  For on call review [email or web address].

## 2023-09-06 NOTE — Progress Notes (Addendum)
Daily Progress Note   Patient Name: Mark Mack       Date: 09/06/2023 DOB: [birth year]  Age: 67 y.o. MRN#: [id number] Attending Physician: Jonah Blue, MD Primary Care Physician: Elane Fritz, MD Admit Date: 08/31/2023  Reason for Consultation/Follow-up: Establishing goals of care  Subjective: Notes and labs reviewed.  Into see patient.  He is currently sitting on the side of the bed.  He denies complaint at this time, and states he is feeling better and is ready to get up and start walking around.  He discusses that after our conversation yesterday, he was able to further discuss with his wife and he formally decided that he wanted all care possible to keep him alive as long as possible including CPR.  CODE STATUS has been changed by attending team.  Patient is waiting for CIR admission.   Length of Stay: 6  Current Medications: Scheduled Meds:   Chlorhexidine Gluconate Cloth  6 each Topical Q0600   feeding supplement (KATE FARMS STANDARD 1.4)  325 mL Oral TID BM   folic acid  1 mg Oral Daily   insulin aspart  0-6 Units Subcutaneous Q4H   losartan  25 mg Oral Daily   metoprolol succinate  12.5 mg Oral Daily   multivitamin with minerals  1 tablet Oral Daily   pantoprazole  40 mg Oral QHS   potassium chloride  20 mEq Oral Once   QUEtiapine  25 mg Oral QHS   thiamine  100 mg Oral Daily   traZODone  50 mg Oral QHS    Continuous Infusions:  piperacillin-tazobactam (ZOSYN)  IV 3.375 g (09/06/23 0535)    PRN Meds: docusate sodium, nitroGLYCERIN, ondansetron (ZOFRAN) IV, mouth rinse, polyethylene glycol, sodium chloride  Physical Exam Pulmonary:     Effort: Pulmonary effort is normal.  Skin:    General: Skin is warm and dry.  Neurological:     Mental Status: He is alert.              Vital Signs: BP 139/77 (BP Location: Left Arm)   Pulse 82   Temp 98.1 F (36.7 C)   Resp 18   Ht 5\' 6"  (1.676 m)   Wt 68.7 kg   SpO2 98%   BMI 24.45 kg/m  SpO2: SpO2: 98 % O2 Device: O2 Device: High  Flow Nasal Cannula O2 Flow Rate: O2 Flow Rate (L/min): 8 L/min  Intake/output summary:  Intake/Output Summary (Last 24 hours) at 09/06/2023 1444 Last data filed at 09/06/2023 1043 Gross per 24 hour  Intake 1242.33 ml  Output 1300 ml  Net -57.67 ml   LBM: Last BM Date : 09/05/23 Baseline Weight: Weight: 66.5 kg Most recent weight: Weight: 68.7 kg    Patient Active Problem List   Diagnosis Date Noted   Cardiac arrest (HCC) 08/31/2023   STEMI (ST elevation myocardial infarction) (HCC) 08/31/2023   Chest pain 04/03/2018   Coronary artery disease of native artery of native heart with stable angina pectoris (HCC) 02/14/2018   History of total right hip replacement 10/02/2012   Primary hypertension 12/06/2008   HLA B27 positive 09/21/2008   Hypercholesterolemia 04/17/2005   Arthropathy of pelvic region and thigh 09/15/2000   Esophageal reflux 09/15/2000    Palliative Care Assessment & Plan    Recommendations/Plan: Patient now full code/full scope.  He confirms that he wants any and all care possible to keep him alive as long as possible. PMT will sign off as goals are set. Code Status:    Code Status Orders  (From admission, onward)           Start     Ordered   09/05/23 2024  Full code  (Code Status)  Continuous       Question:  By:  Answer:  Consent: discussion documented in EHR   09/05/23 2023           Code Status History     Date Active Date Inactive Code Status Order ID Comments User Context   08/31/2023 1320 09/05/2023 2023 Limited: Do not attempt resuscitation (DNR) -DNR-LIMITED -Do Not Intubate/DNI  [id number]  [name], MD Inpatient   08/31/2023 1000 08/31/2023 1320 Full Code [id number]  [name], MD ED   04/03/2018 0801  04/03/2018 1735 Full Code [id number]  Barbaraann Rondo, MD Inpatient       Thank you for allowing the Palliative Medicine Team to assist in the care of this patient.   Morton Stall, NP  Please contact Palliative Medicine Team phone at [phone] for questions and concerns.

## 2023-09-06 NOTE — Progress Notes (Signed)
[facility] CLINIC CARDIOLOGY PROGRESS NOTE   Patient ID: Mark Mack MRN: [id number] DOB/AGE: 1955/11/08 67 y.o.  Admit date: 08/31/2023 Primary Physician Hillery Hunter, Graylin Shiver, MD  Primary Cardiologist Dr. Hessie Dibble [facility] - last seen 2019)  HPI: Mark Mack is a 67 y.o. male with a past medical history of CAD s/p CABG x 4 2019, hypertension, hyperlipidemia who was brought to the ED on 08/31/2023 for witnessed cardiac arrest. When ROSC was obtained there was concern for inferolateral STEMI and patient was taken emergently to the cath lab.   Interval History:  -Patient seen and examined at bedside, resting comfortably. Family is with him and he is feeling well. No events overnight. Denies chest pain, shortness of breath, palpitations, diaphoresis, syncope, edema, PND, orthopnea.   Review of systems complete and found to be negative unless listed above    Vitals:   09/05/23 2147 09/06/23 0002 09/06/23 0425 09/06/23 0837  BP: (!) 151/80 (!) 146/75 (!) 158/84 139/77  Pulse:  84 83 82  Resp: 18 18 18    Temp: 99.5 F (37.5 C) 100 F (37.8 C) 99.1 F (37.3 C) 98.1 F (36.7 C)  TempSrc: Oral Oral Oral   SpO2: 92% 92% 92% 98%  Weight:      Height:         Intake/Output Summary (Last 24 hours) at 09/06/2023 1422 Last data filed at 09/06/2023 1043 Gross per 24 hour  Intake 1242.33 ml  Output 1300 ml  Net -57.67 ml     PHYSICAL EXAM General: Ill appearing, well-nourished, no acute distress. Sitting upright in bedside chair.  HEENT: Normocephalic and atraumatic. Neck: No JVD.  Lungs: Diminished bilaterally. Normal effort on 3L HFNC. Heart: HRRR. Normal S1 and S2 without gallops or murmurs. Radial & DP pulses 2+ bilaterally. Abdomen: Non-distended appearing.  Msk: Normal strength and tone for age. Extremities: No clubbing, cyanosis or edema.     LABS: Basic Metabolic Panel: Recent Labs    09/05/23 0920 09/06/23 0502  NA 148* 144  K 3.4* 3.6  CL 115* 114*  CO2 25 22  GLUCOSE  105* 113*  BUN 31* 29*  CREATININE 1.57* 1.36*  CALCIUM 8.0* 8.0*  MG 2.8* 2.4  PHOS 2.5 2.6   Liver Function Tests: Recent Labs    09/05/23 0914  AST 103*  ALT 75*  ALKPHOS 84  BILITOT 1.3*  PROT 6.3*  ALBUMIN 2.9*    No results for input(s): "LIPASE", "AMYLASE" in the last 72 hours.  CBC: Recent Labs    09/04/23 0304 09/05/23 0920  WBC 20.7* 16.8*  NEUTROABS  --  13.0*  HGB 10.1* 11.2*  HCT 30.0* 34.2*  MCV 82.6 86.1  PLT 104* 115*   Cardiac Enzymes: No results for input(s): "CKTOTAL", "CKMB", "CKMBINDEX", "TROPONINIHS" in the last 72 hours.  BNP: No results for input(s): "BNP" in the last 72 hours. D-Dimer: No results for input(s): "DDIMER" in the last 72 hours. Hemoglobin A1C: No results for input(s): "HGBA1C" in the last 72 hours.  Fasting Lipid Panel: No results for input(s): "CHOL", "HDL", "LDLCALC", "TRIG", "CHOLHDL", "LDLDIRECT" in the last 72 hours.  Thyroid Function Tests: No results for input(s): "TSH", "T4TOTAL", "T3FREE", "THYROIDAB" in the last 72 hours.  Invalid input(s): "FREET3" Anemia Panel: No results for input(s): "VITAMINB12", "FOLATE", "FERRITIN", "TIBC", "IRON", "RETICCTPCT" in the last 72 hours.  No results found.   ECHO as above  TELEMETRY reviewed by me 09/06/23: sinus rhythm rate 80s, PVCs  EKG reviewed by me 09/06/23: sinus rhythm rate 80 bpm  DATA reviewed by me 09/06/23: last 24h vitals tele labs imaging I/O, PCCM progress note, neurology note  Principal Problem:   Cardiac arrest [facility]) Active Problems:   STEMI (ST elevation myocardial infarction) (HCC)    ASSESSMENT AND PLAN: Mark Mack is a 67 y.o. male with a past medical history of CAD s/p CABG x 4 2019, hypertension, hyperlipidemia who was brought to the ED on 08/31/2023 for witnessed cardiac arrest. When ROSC was obtained there was concern for inferolateral STEMI and patient was taken emergently to the cath lab.   # Cardiac arrest # STEMI # Coronary artery  disease s/p CABG x4 2019 Patient with witnessed cardiac arrest, ROSC obtained and EKG concerning for inferolateral STEMI. Patient taken urgently to cath lab 11/3, unsuccessful attempts to cross ostial LCx and prox-mid RCA lesions. PCWP of 25 mmHg. Required multiple vasopressors, now weaned completed. Echo this admission demonstrated preserved EF.  -Continue Losartan and Toprol.  -Will hold asa, plavix given alveolar hemorrhage. Consider starting once Hgb stable.  -Recommend addition of statin likely on discharge as LFTs improve.   # Acute Hypoxic and Hypercapnic Respiratory Failure # Pulmonary contusion # Bland alveolar hemorrhage Bronch done 11/3 demonstrated alveolar hemorrhage likely 2/2 pulmonary contusion. Extubated 09/02/23. -Management per PCCM.   #AKI on CKD #Metabolic acidosis Cr 1.24 on admission -Continue to monitor renal function closely.     Signed:  Clotilde Dieter, DO  09/06/2023, 2:22 PM [facility] Cardiology

## 2023-09-06 NOTE — Plan of Care (Signed)
  Problem: Education: Goal: Knowledge of General Education information will improve Description: Including pain rating scale, medication(s)/side effects and non-pharmacologic comfort measures Outcome: Progressing   Problem: Activity: Goal: Risk for activity intolerance will decrease Outcome: Progressing   Problem: Nutrition: Goal: Adequate nutrition will be maintained Outcome: Progressing   

## 2023-09-06 NOTE — Progress Notes (Signed)
PHARMACY CONSULT NOTE - FOLLOW UP  Pharmacy Consult for Electrolyte Monitoring and Replacement   Recent Labs:  Potassium (mmol/L)  Date Value  09/06/2023 3.6   Magnesium (mg/dL)  Date Value  16/07/9603 2.4   Calcium (mg/dL)  Date Value  54/06/8118 8.0 (L)   Albumin (g/dL)  Date Value  14/78/2956 2.9 (L)   Phosphorus (mg/dL)  Date Value  21/30/8657 2.6   Sodium (mmol/L)  Date Value  09/06/2023 144   Corrected Ca: 8.9 mg/dL  Assessment: 67 y.o. male with a past medical history of CAD s/p CABG x 4 2019, hypertension, hyperlipidemia who was brought to the ED on 08/31/2023 for witnessed cardiac arrest. Pharmacy is asked to follow and replace electrolytes while in CCU.   Medications:  losartan  Goal of Therapy:  Electrolytes WNL  Plan:  K+ 3.6; will order KCl 20 mEq PO x 1 F/u electrolytes tomorrow with AM labs   Mark Mack A, PharmD 09/06/2023 10:15 AM

## 2023-09-07 DIAGNOSIS — I469 Cardiac arrest, cause unspecified: Secondary | ICD-10-CM | POA: Diagnosis not present

## 2023-09-07 LAB — COMPREHENSIVE METABOLIC PANEL
ALT: 53 U/L — ABNORMAL HIGH (ref 0–44)
AST: 45 U/L — ABNORMAL HIGH (ref 15–41)
Albumin: 2.6 g/dL — ABNORMAL LOW (ref 3.5–5.0)
Alkaline Phosphatase: 76 U/L (ref 38–126)
Anion gap: 9 (ref 5–15)
BUN: 26 mg/dL — ABNORMAL HIGH (ref 8–23)
CO2: 20 mmol/L — ABNORMAL LOW (ref 22–32)
Calcium: 8 mg/dL — ABNORMAL LOW (ref 8.9–10.3)
Chloride: 114 mmol/L — ABNORMAL HIGH (ref 98–111)
Creatinine, Ser: 1.11 mg/dL (ref 0.61–1.24)
GFR, Estimated: >60 mL/min (ref >60)
Glucose, Bld: 105 mg/dL — ABNORMAL HIGH (ref 70–99)
Potassium: 3.5 mmol/L (ref 3.5–5.1)
Sodium: 143 mmol/L (ref 135–145)
Total Bilirubin: 0.8 mg/dL (ref <1.2)
Total Protein: 5.8 g/dL — ABNORMAL LOW (ref 6.5–8.1)

## 2023-09-07 LAB — CBC WITH DIFFERENTIAL/PLATELET
Abs Immature Granulocytes: 0.31 10*3/uL — ABNORMAL HIGH (ref 0.00–0.07)
Basophils Absolute: 0.1 10*3/uL (ref 0.0–0.1)
Basophils Relative: 0 %
Eosinophils Absolute: 0.2 10*3/uL (ref 0.0–0.5)
Eosinophils Relative: 2 %
HCT: 29.8 % — ABNORMAL LOW (ref 39.0–52.0)
Hemoglobin: 9.7 g/dL — ABNORMAL LOW (ref 13.0–17.0)
Immature Granulocytes: 2 %
Lymphocytes Relative: 11 %
Lymphs Abs: 1.8 10*3/uL (ref 0.7–4.0)
MCH: 28 pg (ref 26.0–34.0)
MCHC: 32.6 g/dL (ref 30.0–36.0)
MCV: 86.1 fL (ref 80.0–100.0)
Monocytes Absolute: 1.3 10*3/uL — ABNORMAL HIGH (ref 0.1–1.0)
Monocytes Relative: 9 %
Neutro Abs: 11.9 10*3/uL — ABNORMAL HIGH (ref 1.7–7.7)
Neutrophils Relative %: 76 %
Platelets: 200 10*3/uL (ref 150–400)
RBC: 3.46 MIL/uL — ABNORMAL LOW (ref 4.22–5.81)
RDW: 15.3 % (ref 11.5–15.5)
WBC: 15.6 10*3/uL — ABNORMAL HIGH (ref 4.0–10.5)
nRBC: 1.1 % — ABNORMAL HIGH (ref 0.0–0.2)

## 2023-09-07 LAB — GLUCOSE, CAPILLARY
Glucose-Capillary: 113 mg/dL — ABNORMAL HIGH (ref 70–99)
Glucose-Capillary: 133 mg/dL — ABNORMAL HIGH (ref 70–99)
Glucose-Capillary: 82 mg/dL (ref 70–99)
Glucose-Capillary: 85 mg/dL (ref 70–99)
Glucose-Capillary: 92 mg/dL (ref 70–99)
Glucose-Capillary: 92 mg/dL (ref 70–99)

## 2023-09-07 LAB — PHOSPHORUS: Phosphorus: 3.1 mg/dL (ref 2.5–4.6)

## 2023-09-07 LAB — MAGNESIUM: Magnesium: 2.2 mg/dL (ref 1.7–2.4)

## 2023-09-07 MED ORDER — ASPIRIN 81 MG PO CHEW
81.0000 mg | CHEWABLE_TABLET | Freq: Every day | ORAL | Status: DC
  Administered 2023-09-07 – 2023-09-08 (×2): 81 mg via ORAL
  Filled 2023-09-07 (×2): qty 1

## 2023-09-07 MED ORDER — ATORVASTATIN CALCIUM 20 MG PO TABS
40.0000 mg | ORAL_TABLET | Freq: Every day | ORAL | Status: DC
  Administered 2023-09-07 – 2023-09-08 (×2): 40 mg via ORAL
  Filled 2023-09-07 (×2): qty 2

## 2023-09-07 MED ORDER — POTASSIUM CHLORIDE CRYS ER 20 MEQ PO TBCR
40.0000 meq | EXTENDED_RELEASE_TABLET | Freq: Once | ORAL | Status: AC
  Administered 2023-09-07: 40 meq via ORAL
  Filled 2023-09-07: qty 2

## 2023-09-07 MED ORDER — CLOPIDOGREL BISULFATE 75 MG PO TABS
75.0000 mg | ORAL_TABLET | Freq: Every day | ORAL | Status: DC
  Administered 2023-09-08: 75 mg via ORAL
  Filled 2023-09-07: qty 1

## 2023-09-07 NOTE — Progress Notes (Signed)
Physical Therapy Treatment Patient Details Name: Mark Mack MRN: [id number] DOB: Nov 07, 1955 Today's Date: 09/07/2023   History of Present Illness Patient is a 67 year old male presenting with cardiac arrest at home, CPR initiated at home prior to EMS. Found to have inferior STEMI s/p cath lab for [facility]. Patient was intubated and was extubated on 11/5.    PT Comments  Pt excited for therapy.  Pt up in chair finishing eating upon arrival.  Stands with min a x 1 mostly for cues for hand placements and balance once standing due to dizziness that does clear with time.  Session focuses on standing ex 2 x 10 for marches, heel raises, toe raises and SLR with walker support.  Sit to stand x 10 with emphasis on hand placements.  Time spent in standing for reaching high/low/left/right/behind and picking things up off floor with single hand support.  Overall does well with balance challenges.  Static standing x 5 minutes with walker.  Pt is encouraged to take rest breaks as needed during session.  At times he does seem to push himself and needs encouragement to stop and rest.  Occasional dizziness remains during session but clears with time and rest.  Education provided to pt and wife.  Remained in chair after session with needs met and alarm on.    Pt remains a great CIR candidate.   If plan is discharge home, recommend the following: A little help with walking and/or transfers;A little help with bathing/dressing/bathroom;Assist for transportation;Help with stairs or ramp for entrance;Assistance with cooking/housework   Can travel by private vehicle        Equipment Recommendations       Recommendations for Other Services       Precautions / Restrictions Precautions Precautions: Fall Precaution Comments: monitor Sp02/ RR Restrictions Weight Bearing Restrictions: No     Mobility  Bed Mobility               General bed mobility comments: NT pt in recliner pre/post tx Patient Response:  Cooperative  Transfers Overall transfer level: Needs assistance Equipment used: Rolling walker (2 wheels) Transfers: Sit to/from Stand Sit to Stand: Contact guard assist           General transfer comment: multiple transfers during session for hand placement education and carryover    Ambulation/Gait         Gait velocity: decreased     General Gait Details: deferred.  focused on functional mobility skills, balance and ex.   Stairs             Wheelchair Mobility     Tilt Bed Tilt Bed Patient Response: Cooperative  Modified Rankin (Stroke Patients Only)       Balance Overall balance assessment: Needs assistance Sitting-balance support: Feet supported, No upper extremity supported Sitting balance-Leahy Scale: Good     Standing balance support: Bilateral upper extremity supported, Reliant on assistive device for balance, During functional activity Standing balance-Leahy Scale: Good Standing balance comment: overall does well but limited by bouts of dizziness                            Cognition Arousal: Alert Behavior During Therapy: WFL for tasks assessed/performed Overall Cognitive Status: Within Functional Limits for tasks assessed  General Comments: Pt highly motivated throughout session        Exercises Other Exercises Other Exercises: standing ex for strength and balance    General Comments        Pertinent Vitals/Pain Pain Assessment Pain Assessment: Faces Faces Pain Scale: Hurts a little bit Pain Location: chest pain Pain Descriptors / Indicators: Discomfort Pain Intervention(s): Limited activity within patient's tolerance, Monitored during session    Home Living                          Prior Function            PT Goals (current goals can now be found in the care plan section) Progress towards PT goals: Progressing toward goals    Frequency    Min  1X/week      PT Plan      Co-evaluation              AM-PAC PT "6 Clicks" Mobility   Outcome Measure  Help needed turning from your back to your side while in a flat bed without using bedrails?: A Little Help needed moving from lying on your back to sitting on the side of a flat bed without using bedrails?: A Little Help needed moving to and from a bed to a chair (including a wheelchair)?: A Little Help needed standing up from a chair using your arms (e.g., wheelchair or bedside chair)?: A Little Help needed to walk in hospital room?: A Little Help needed climbing 3-5 steps with a railing? : A Lot 6 Click Score: 17    End of Session Equipment Utilized During Treatment: Gait belt;Oxygen Activity Tolerance: Patient tolerated treatment well Patient left: in chair;with call bell/phone within reach;with family/visitor present Nurse Communication: Mobility status PT Visit Diagnosis: Muscle weakness (generalized) (M62.81);Unsteadiness on feet (R26.81)     Time: 1500-1530 PT Time Calculation (min) (ACUTE ONLY): 30 min  Charges:    $Gait Training: 8-22 mins $Therapeutic Activity: 8-22 mins PT General Charges $$ ACUTE PT VISIT: 1 Visit                   Danielle Dess, PTA 09/07/23, 4:04 PM

## 2023-09-07 NOTE — Plan of Care (Signed)
progressing 

## 2023-09-07 NOTE — Progress Notes (Signed)
Progress Note   Patient: Mark Mack WUJ:[id number] DOB: 1956/03/19 DOA: 08/31/2023     7 DOS: the patient was seen and examined on 09/07/2023   Brief hospital course: 67yo with h/o HTN, cocaine abuse, and CAD s/p CABG who presented on 11/3 with witnessed cardiac arrest at home.  Code STEMI was called and he was found to have 100% occlusion of the ostial circumflex and proximal RCA with unsuccessful intervention attempts.  He suffered prolonged downtime and was intubated with concern for poor overall prognosis, made DNR.  Extubated on 11/5, currently on HFNC O2.  No longer requiring vasopressors.  Transferred out of ICU.  Awaiting discharge to CIR.  Remains on HFNC O2.  Assessment and Plan:  Cardiac Arrest Patient with known h/o CAD s/p CABG - used cocaine PTA, had out of facility arrest and prolonged CPR, ROSC obtained Inferior STEMI with cardiogenic shock Unsuccessful attempts to cross ostial LCx and prox-mid RCA during cath Required vasopressors, now off Troponin peaked at 16K 11/4 TTE: EF of 67% Cardiology following, appreciate input Holding asa, plavix given alveolar hemorrhage - will restart ASA today, Plavix tomorrow and monitor to ensure hemoptysis does not worsen Will statin since LFTs are improved Consider restarting home BP meds as BP is improving, possibly in another day or two Code status changed back to full code per family on 11/8   Post-arrest Metabolic Encephalopathy -  improving 11/5 Repeat Head CT: no evidence of anoxic injury or hemorrhage 11/4 EEG: diffuse encephalopathy without evidence of seizures No longer on Keppra UDS + benzo, cocaine, cannibinoid Neuro following Speech, OT & PT following Trazodone prn for insomnia Continues with periodic mood lability, agitation, and short-term memory loss  He was planned for MRI but based on improvement at this time and the low likelihood that it will provide meaningful information to change his clinical course, will hold  for now; family is aware that this can be ordered at any time if he regresses He experienced delirium overnight on 11/8, probably out of the window for DTs so start nightly Seroquel for hospital-associated delirium in conjunction with post-arrest encephalopathy Psychiatry consulted, per wife request    Acute Hypoxic and Hypercapnic Respiratory Failure Patient experienced pulmonary contusion and alveolar hemorrhage resulting from CPR No longer requiring mechanical ventilation Weaned to [facility], appears likely to need O2 on an going basis and can wean as tolerated Continue bronchodilators as needed Ongoing bloody respiratory secretions, will continue to hold anticoagulation/anti-platelet agents for now but hope to restart in a day or two Completed course of antibiotics (Zosyn) Blood cultures NTD x 5 days Hgb lower today, likely ABLA; will monitor with resumption of DAPT   Pancreatitis/Transaminitis GI prophylaxis, continue protonix 11/5 ABD/Pelvic CT: persistent peripancreatic stranding, no evidence of hemorrhage Eating well LFT elevation on presentation, likely shock liver from cardiac arrest; now improving and almost resolved Will start Lipitor   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 AKI due to cardiogenic shock Also with metabolic acidosis Renal function is slowly improving, back to baseline Avoid nephrotoxic agents and renal dose medications as needed   Electrolyte abnormalities Replace electrolytes as needed Electrolytes per pharmacy   Acute Anemia/Thrombocytopenia SCDs only for now Trend CBC, all improving Consider transfusion if Hemoglobin < 7   Aspiration Pneumonia Completed course of Zosyn for aspiration pneumonia SLP evaluation, recommending dysphagia 3 diet    Polysubstance abuse UDS positive for BZD, cocaine, THC Patient acknowledges periodic use at "parties" Strict cessation encouraged       Consultants:  PCCM Cardiology Neurology Palliative care [facility]  team PT/OT SLP CIR   Procedures: CPR 11/3 Intubation 11/3 Art line 11/3 CVC 11/3 Bronchoscopy 11/3 EEG 11/4   Antibiotics: Unasyn 11/3 Zosyn 11/3-9  30 Day Unplanned Readmission Risk Score    Flowsheet Row ED to Hosp-Admission (Current) from 08/31/2023 in [facility] PCU  30 Day Unplanned Readmission Risk Score (%) 17.6 Filed at 09/07/2023 0401       This score is the patient's risk of an unplanned readmission within 30 days of being discharged (0 -100%). The score is based on dignosis, age, lab data, medications, orders, and past utilization.   Low:  0-14.9   Medium: 15-21.9   High: 22-29.9   Extreme: 30 and above           Subjective: Still with mood lability at night.  Wife is requesting psychiatry consult, as he is taking out his frustrations on her.  He is very eager to go to CIR.   Objective: Vitals:   09/07/23 0803 09/07/23 1142  BP: (!) 149/74 (!) 146/88  Pulse: 79 75  Resp: 20 (!) 22  Temp: 99 F (37.2 C) 98.2 F (36.8 C)  SpO2: 92% 96%   No intake or output data in the 24 hours ending 09/07/23 1440  Filed Weights   09/01/23 0457 09/05/23 0500 09/07/23 0803  Weight: 72.2 kg 68.7 kg 69.9 kg    Exam:  General:  Appears calm and comfortable and is in NAD, on HFNC O2 Eyes:   EOMI, normal lids, iris ENT:  grossly normal hearing, lips & tongue, mmm Neck:  no LAD, masses or thyromegaly Cardiovascular:  RRR, no m/r/g. No LE edema.  Respiratory:   CTA bilaterally with no wheezes/rales/rhonchi.  Normal respiratory effort on HFNC O2. Abdomen:  soft, NT, ND Skin:  no rash or induration seen on limited exam Musculoskeletal:  grossly normal tone BUE/BLE, good ROM, no bony abnormality Psychiatric:  blunted mood and affect, speech fluent and appropriate, AOx3 Neurologic:  CN 2-12 grossly intact, moves all extremities in coordinated fashion  Data Reviewed: I have reviewed the patient's lab results since admission.  Pertinent labs for today  include:   CO2 20 BUN 26/Creatinine 1.11/GFR >60, improving Albumin 2.6 AST 45/ALT 54/Bili 0.8, improving WBC 15.6, improving Hgb 9.7, 11.2 on 11/8    Family Communication: Wife was present throughout evaluation  Disposition: Status is: Inpatient Remains inpatient appropriate because: ongoing management     Time spent: 50 minutes  Unresulted Labs (From admission, onward)     Start     Ordered   09/08/23 0500  CBC with Differential/Platelet  Tomorrow morning,   R       Question:  Specimen collection method  Answer:  Unit=Unit collect   09/07/23 0757   09/08/23 0500  Basic metabolic panel  Tomorrow morning,   R       Question:  Specimen collection method  Answer:  Unit=Unit collect   09/07/23 0757             Author: Jonah Blue, MD 09/07/2023 2:40 PM  For on call review [email or web address].

## 2023-09-07 NOTE — Progress Notes (Signed)
PHARMACY CONSULT NOTE  Pharmacy Consult for Electrolyte Monitoring and Replacement   Recent Labs:  Potassium (mmol/L)  Date Value  09/07/2023 3.5   Magnesium (mg/dL)  Date Value  21/30/8657 2.2   Calcium (mg/dL)  Date Value  84/69/6295 8.0 (L)   Albumin (g/dL)  Date Value  28/41/3244 2.6 (L)   Phosphorus (mg/dL)  Date Value  10/30/7251 3.1   Sodium (mmol/L)  Date Value  09/07/2023 143   Assessment: 67 y.o. male with a past medical history of CAD s/p CABG x 4 2019, hypertension, hyperlipidemia who was brought to the ED on 08/31/2023 for witnessed cardiac arrest. Pharmacy is asked to follow and replace electrolytes while in CCU.   Goal of Therapy:  Electrolytes WNL  Plan:  K+ 3.5; will order KCl 40 mEq PO x 1 Defer labs for tomorrow AM  Tressie Ellis 09/07/2023 7:45 AM

## 2023-09-08 ENCOUNTER — Inpatient Hospital Stay (HOSPITAL_COMMUNITY)
Admission: AD | Admit: 2023-09-08 | Discharge: 2023-09-12 | DRG: 945 | Disposition: A | Discharge status: Home / Self Care | Payer: Medicare HMO | Source: Other Acute Inpatient Hospital | Attending: Physical Medicine and Rehabilitation | Admitting: Physical Medicine and Rehabilitation

## 2023-09-08 ENCOUNTER — Other Ambulatory Visit: Payer: Self-pay

## 2023-09-08 ENCOUNTER — Encounter: Payer: Self-pay | Admitting: Internal Medicine

## 2023-09-08 ENCOUNTER — Encounter (HOSPITAL_COMMUNITY): Payer: Self-pay | Admitting: Physical Medicine and Rehabilitation

## 2023-09-08 ENCOUNTER — Inpatient Hospital Stay (HOSPITAL_COMMUNITY): Payer: Medicare HMO

## 2023-09-08 DIAGNOSIS — E876 Hypokalemia: Secondary | ICD-10-CM | POA: Diagnosis present

## 2023-09-08 DIAGNOSIS — Z8674 Personal history of sudden cardiac arrest: Secondary | ICD-10-CM

## 2023-09-08 DIAGNOSIS — E559 Vitamin D deficiency, unspecified: Secondary | ICD-10-CM | POA: Diagnosis present

## 2023-09-08 DIAGNOSIS — Z96643 Presence of artificial hip joint, bilateral: Secondary | ICD-10-CM | POA: Diagnosis present

## 2023-09-08 DIAGNOSIS — R570 Cardiogenic shock: Secondary | ICD-10-CM | POA: Diagnosis present

## 2023-09-08 DIAGNOSIS — Z7902 Long term (current) use of antithrombotics/antiplatelets: Secondary | ICD-10-CM | POA: Diagnosis not present

## 2023-09-08 DIAGNOSIS — J690 Pneumonitis due to inhalation of food and vomit: Secondary | ICD-10-CM | POA: Diagnosis present

## 2023-09-08 DIAGNOSIS — Y92239 Unspecified place in hospital as the place of occurrence of the external cause: Secondary | ICD-10-CM | POA: Diagnosis not present

## 2023-09-08 DIAGNOSIS — I2119 ST elevation (STEMI) myocardial infarction involving other coronary artery of inferior wall: Secondary | ICD-10-CM | POA: Diagnosis present

## 2023-09-08 DIAGNOSIS — R042 Hemoptysis: Secondary | ICD-10-CM | POA: Diagnosis present

## 2023-09-08 DIAGNOSIS — G9341 Metabolic encephalopathy: Secondary | ICD-10-CM | POA: Diagnosis present

## 2023-09-08 DIAGNOSIS — D62 Acute posthemorrhagic anemia: Secondary | ICD-10-CM | POA: Diagnosis present

## 2023-09-08 DIAGNOSIS — F09 Unspecified mental disorder due to known physiological condition: Secondary | ICD-10-CM

## 2023-09-08 DIAGNOSIS — J9690 Respiratory failure, unspecified, unspecified whether with hypoxia or hypercapnia: Secondary | ICD-10-CM | POA: Diagnosis present

## 2023-09-08 DIAGNOSIS — N1831 Chronic kidney disease, stage 3a: Secondary | ICD-10-CM | POA: Diagnosis present

## 2023-09-08 DIAGNOSIS — Z23 Encounter for immunization: Secondary | ICD-10-CM

## 2023-09-08 DIAGNOSIS — F1910 Other psychoactive substance abuse, uncomplicated: Secondary | ICD-10-CM | POA: Diagnosis present

## 2023-09-08 DIAGNOSIS — N179 Acute kidney failure, unspecified: Secondary | ICD-10-CM | POA: Diagnosis present

## 2023-09-08 DIAGNOSIS — R42 Dizziness and giddiness: Secondary | ICD-10-CM | POA: Diagnosis not present

## 2023-09-08 DIAGNOSIS — I469 Cardiac arrest, cause unspecified: Secondary | ICD-10-CM | POA: Diagnosis not present

## 2023-09-08 DIAGNOSIS — Z7141 Alcohol abuse counseling and surveillance of alcoholic: Secondary | ICD-10-CM

## 2023-09-08 DIAGNOSIS — F17210 Nicotine dependence, cigarettes, uncomplicated: Secondary | ICD-10-CM | POA: Diagnosis present

## 2023-09-08 DIAGNOSIS — Z7982 Long term (current) use of aspirin: Secondary | ICD-10-CM | POA: Diagnosis not present

## 2023-09-08 DIAGNOSIS — I213 ST elevation (STEMI) myocardial infarction of unspecified site: Secondary | ICD-10-CM | POA: Diagnosis present

## 2023-09-08 DIAGNOSIS — G931 Anoxic brain damage, not elsewhere classified: Principal | ICD-10-CM | POA: Diagnosis present

## 2023-09-08 DIAGNOSIS — Z79899 Other long term (current) drug therapy: Secondary | ICD-10-CM | POA: Diagnosis not present

## 2023-09-08 DIAGNOSIS — D72829 Elevated white blood cell count, unspecified: Secondary | ICD-10-CM | POA: Insufficient documentation

## 2023-09-08 DIAGNOSIS — F1010 Alcohol abuse, uncomplicated: Secondary | ICD-10-CM | POA: Diagnosis present

## 2023-09-08 DIAGNOSIS — I252 Old myocardial infarction: Secondary | ICD-10-CM | POA: Diagnosis not present

## 2023-09-08 DIAGNOSIS — K7200 Acute and subacute hepatic failure without coma: Secondary | ICD-10-CM | POA: Diagnosis present

## 2023-09-08 DIAGNOSIS — R5381 Other malaise: Principal | ICD-10-CM | POA: Diagnosis present

## 2023-09-08 DIAGNOSIS — I10 Essential (primary) hypertension: Secondary | ICD-10-CM | POA: Diagnosis present

## 2023-09-08 DIAGNOSIS — L89896 Pressure-induced deep tissue damage of other site: Secondary | ICD-10-CM | POA: Diagnosis present

## 2023-09-08 DIAGNOSIS — K859 Acute pancreatitis without necrosis or infection, unspecified: Secondary | ICD-10-CM | POA: Diagnosis present

## 2023-09-08 DIAGNOSIS — I2510 Atherosclerotic heart disease of native coronary artery without angina pectoris: Secondary | ICD-10-CM | POA: Diagnosis present

## 2023-09-08 DIAGNOSIS — Z951 Presence of aortocoronary bypass graft: Secondary | ICD-10-CM | POA: Diagnosis not present

## 2023-09-08 DIAGNOSIS — J9601 Acute respiratory failure with hypoxia: Secondary | ICD-10-CM | POA: Diagnosis present

## 2023-09-08 DIAGNOSIS — T465X5A Adverse effect of other antihypertensive drugs, initial encounter: Secondary | ICD-10-CM | POA: Diagnosis not present

## 2023-09-08 DIAGNOSIS — R4189 Other symptoms and signs involving cognitive functions and awareness: Secondary | ICD-10-CM | POA: Diagnosis present

## 2023-09-08 LAB — CBC WITH DIFFERENTIAL/PLATELET
Abs Immature Granulocytes: 0.33 10*3/uL — ABNORMAL HIGH (ref 0.00–0.07)
Basophils Absolute: 0.1 10*3/uL (ref 0.0–0.1)
Basophils Relative: 0 %
Eosinophils Absolute: 0.3 10*3/uL (ref 0.0–0.5)
Eosinophils Relative: 1 %
HCT: 30 % — ABNORMAL LOW (ref 39.0–52.0)
Hemoglobin: 9.8 g/dL — ABNORMAL LOW (ref 13.0–17.0)
Immature Granulocytes: 2 %
Lymphocytes Relative: 7 %
Lymphs Abs: 1.6 10*3/uL (ref 0.7–4.0)
MCH: 27.9 pg (ref 26.0–34.0)
MCHC: 32.7 g/dL (ref 30.0–36.0)
MCV: 85.5 fL (ref 80.0–100.0)
Monocytes Absolute: 1.4 10*3/uL — ABNORMAL HIGH (ref 0.1–1.0)
Monocytes Relative: 6 %
Neutro Abs: 18.2 10*3/uL — ABNORMAL HIGH (ref 1.7–7.7)
Neutrophils Relative %: 84 %
Platelets: 295 10*3/uL (ref 150–400)
RBC: 3.51 MIL/uL — ABNORMAL LOW (ref 4.22–5.81)
RDW: 15.6 % — ABNORMAL HIGH (ref 11.5–15.5)
WBC: 21.7 10*3/uL — ABNORMAL HIGH (ref 4.0–10.5)
nRBC: 0.4 % — ABNORMAL HIGH (ref 0.0–0.2)

## 2023-09-08 LAB — BASIC METABOLIC PANEL
Anion gap: 8 (ref 5–15)
BUN: 24 mg/dL — ABNORMAL HIGH (ref 8–23)
CO2: 19 mmol/L — ABNORMAL LOW (ref 22–32)
Calcium: 8.2 mg/dL — ABNORMAL LOW (ref 8.9–10.3)
Chloride: 112 mmol/L — ABNORMAL HIGH (ref 98–111)
Creatinine, Ser: 1.11 mg/dL (ref 0.61–1.24)
GFR, Estimated: >60 mL/min (ref >60)
Glucose, Bld: 112 mg/dL — ABNORMAL HIGH (ref 70–99)
Potassium: 3.6 mmol/L (ref 3.5–5.1)
Sodium: 139 mmol/L (ref 135–145)

## 2023-09-08 LAB — GLUCOSE, CAPILLARY
Glucose-Capillary: 150 mg/dL — ABNORMAL HIGH (ref 70–99)
Glucose-Capillary: 69 mg/dL — ABNORMAL LOW (ref 70–99)
Glucose-Capillary: 85 mg/dL (ref 70–99)
Glucose-Capillary: 92 mg/dL (ref 70–99)

## 2023-09-08 MED ORDER — VITAMIN B-1 100 MG PO TABS: 100.0000 mg | ORAL_TABLET | Freq: Every day | ORAL | Status: DC

## 2023-09-08 MED ORDER — CLOPIDOGREL BISULFATE 75 MG PO TABS
75.0000 mg | ORAL_TABLET | Freq: Every day | ORAL | Status: DC
  Administered 2023-09-09 – 2023-09-12 (×4): 75 mg via ORAL
  Filled 2023-09-08 (×4): qty 1

## 2023-09-08 MED ORDER — PROCHLORPERAZINE EDISYLATE 10 MG/2ML IJ SOLN: 5.0000 mg | Freq: Four times a day (QID) | INTRAMUSCULAR | Status: DC | PRN

## 2023-09-08 MED ORDER — POTASSIUM CHLORIDE CRYS ER 20 MEQ PO TBCR
40.0000 meq | EXTENDED_RELEASE_TABLET | Freq: Once | ORAL | Status: AC
  Administered 2023-09-08: 40 meq via ORAL
  Filled 2023-09-08: qty 2

## 2023-09-08 MED ORDER — INFLUENZA VAC A&B SURF ANT ADJ 0.5 ML IM SUSY
0.5000 mL | PREFILLED_SYRINGE | INTRAMUSCULAR | Status: DC
  Filled 2023-09-08: qty 0.5

## 2023-09-08 MED ORDER — NITROGLYCERIN 0.4 MG SL SUBL: 0.4000 mg | SUBLINGUAL_TABLET | SUBLINGUAL | Status: DC | PRN

## 2023-09-08 MED ORDER — FOLIC ACID 1 MG PO TABS: 1.0000 mg | ORAL_TABLET | Freq: Every day | ORAL | Status: DC

## 2023-09-08 MED ORDER — QUETIAPINE FUMARATE 25 MG PO TABS: 25.0000 mg | ORAL_TABLET | Freq: Every day | ORAL | Status: DC

## 2023-09-08 MED ORDER — QUETIAPINE FUMARATE 25 MG PO TABS
25.0000 mg | ORAL_TABLET | Freq: Every day | ORAL | Status: DC
  Administered 2023-09-08 – 2023-09-09 (×2): 25 mg via ORAL
  Filled 2023-09-08 (×2): qty 1

## 2023-09-08 MED ORDER — TRAZODONE HCL 50 MG PO TABS: 25.0000 mg | ORAL_TABLET | Freq: Every evening | ORAL | Status: DC | PRN

## 2023-09-08 MED ORDER — KATE FARMS STANDARD 1.4 PO LIQD: 325.0000 mL | Freq: Three times a day (TID) | ORAL | Status: DC

## 2023-09-08 MED ORDER — ASPIRIN 81 MG PO CHEW: 81.0000 mg | CHEWABLE_TABLET | Freq: Every day | ORAL | Status: DC

## 2023-09-08 MED ORDER — FLEET ENEMA RE ENEM: 1.0000 | ENEMA | Freq: Once | RECTAL | Status: DC | PRN

## 2023-09-08 MED ORDER — DIPHENHYDRAMINE HCL 25 MG PO CAPS: 25.0000 mg | ORAL_CAPSULE | Freq: Four times a day (QID) | ORAL | Status: DC | PRN

## 2023-09-08 MED ORDER — ADULT MULTIVITAMIN W/MINERALS CH
1.0000 | ORAL_TABLET | Freq: Every day | ORAL | Status: DC
  Administered 2023-09-09 – 2023-09-10 (×2): 1 via ORAL
  Filled 2023-09-08 (×2): qty 1

## 2023-09-08 MED ORDER — THIAMINE MONONITRATE 100 MG PO TABS
100.0000 mg | ORAL_TABLET | Freq: Every day | ORAL | Status: DC
  Administered 2023-09-09 – 2023-09-12 (×4): 100 mg via ORAL
  Filled 2023-09-08 (×4): qty 1

## 2023-09-08 MED ORDER — METOPROLOL SUCCINATE ER 25 MG PO TB24: 12.5000 mg | ORAL_TABLET | Freq: Every day | ORAL | Status: DC

## 2023-09-08 MED ORDER — ASPIRIN 81 MG PO TBEC
81.0000 mg | DELAYED_RELEASE_TABLET | Freq: Every day | ORAL | Status: DC
  Administered 2023-09-09 – 2023-09-12 (×4): 81 mg via ORAL
  Filled 2023-09-08 (×4): qty 1

## 2023-09-08 MED ORDER — DOCUSATE SODIUM 100 MG PO CAPS: 100.0000 mg | ORAL_CAPSULE | Freq: Two times a day (BID) | ORAL | Status: DC | PRN

## 2023-09-08 MED ORDER — CLOPIDOGREL BISULFATE 75 MG PO TABS: 75.0000 mg | ORAL_TABLET | Freq: Every day | ORAL | Status: DC

## 2023-09-08 MED ORDER — TRAZODONE HCL 50 MG PO TABS: 50.0000 mg | ORAL_TABLET | Freq: Every day | ORAL | Status: DC

## 2023-09-08 MED ORDER — ACETAMINOPHEN 325 MG PO TABS: 325.0000 mg | ORAL_TABLET | ORAL | Status: DC | PRN

## 2023-09-08 MED ORDER — ADULT MULTIVITAMIN W/MINERALS CH: 1.0000 | ORAL_TABLET | Freq: Every day | ORAL | Status: AC

## 2023-09-08 MED ORDER — POLYETHYLENE GLYCOL 3350 17 G PO PACK: 17.0000 g | PACK | Freq: Every day | ORAL | Status: DC | PRN

## 2023-09-08 MED ORDER — SILVER SULFADIAZINE 1 % EX CREA
TOPICAL_CREAM | Freq: Two times a day (BID) | CUTANEOUS | Status: DC
  Filled 2023-09-08: qty 85

## 2023-09-08 MED ORDER — PROCHLORPERAZINE 25 MG RE SUPP: 12.5000 mg | Freq: Four times a day (QID) | RECTAL | Status: DC | PRN

## 2023-09-08 MED ORDER — BISACODYL 10 MG RE SUPP: 10.0000 mg | Freq: Every day | RECTAL | Status: DC | PRN

## 2023-09-08 MED ORDER — ALUM & MAG HYDROXIDE-SIMETH 200-200-20 MG/5ML PO SUSP: 30.0000 mL | ORAL | Status: DC | PRN

## 2023-09-08 MED ORDER — FOLIC ACID 1 MG PO TABS
1.0000 mg | ORAL_TABLET | Freq: Every day | ORAL | Status: DC
  Administered 2023-09-09 – 2023-09-10 (×2): 1 mg via ORAL
  Filled 2023-09-08 (×2): qty 1

## 2023-09-08 MED ORDER — PROCHLORPERAZINE MALEATE 5 MG PO TABS: 5.0000 mg | ORAL_TABLET | Freq: Four times a day (QID) | ORAL | Status: DC | PRN

## 2023-09-08 MED ORDER — ATORVASTATIN CALCIUM 40 MG PO TABS: 40.0000 mg | ORAL_TABLET | Freq: Every day | ORAL | Status: DC

## 2023-09-08 MED ORDER — TRAZODONE HCL 50 MG PO TABS
50.0000 mg | ORAL_TABLET | Freq: Every day | ORAL | Status: DC
Start: 2023-09-08 — End: 2023-09-12
  Administered 2023-09-08 – 2023-09-11 (×4): 50 mg via ORAL
  Filled 2023-09-08 (×4): qty 1

## 2023-09-08 MED ORDER — PANTOPRAZOLE SODIUM 40 MG PO TBEC
40.0000 mg | DELAYED_RELEASE_TABLET | Freq: Every day | ORAL | Status: DC
  Administered 2023-09-08 – 2023-09-11 (×4): 40 mg via ORAL
  Filled 2023-09-08 (×4): qty 1

## 2023-09-08 MED ORDER — GUAIFENESIN-DM 100-10 MG/5ML PO SYRP: 5.0000 mL | ORAL_SOLUTION | Freq: Four times a day (QID) | ORAL | Status: DC | PRN

## 2023-09-08 MED ORDER — ATORVASTATIN CALCIUM 40 MG PO TABS
40.0000 mg | ORAL_TABLET | Freq: Every day | ORAL | Status: DC
Start: 2023-09-09 — End: 2023-09-12
  Administered 2023-09-09 – 2023-09-12 (×4): 40 mg via ORAL
  Filled 2023-09-08 (×4): qty 1

## 2023-09-08 MED ORDER — LOSARTAN POTASSIUM 25 MG PO TABS: 25.0000 mg | ORAL_TABLET | Freq: Every day | ORAL | Status: DC

## 2023-09-08 MED ORDER — SALINE SPRAY 0.65 % NA SOLN: 1.0000 | NASAL | Status: DC | PRN

## 2023-09-08 MED ORDER — LOSARTAN POTASSIUM 25 MG PO TABS
25.0000 mg | ORAL_TABLET | Freq: Every day | ORAL | Status: DC
Start: 2023-09-09 — End: 2023-09-10
  Administered 2023-09-09 – 2023-09-10 (×2): 25 mg via ORAL
  Filled 2023-09-08 (×2): qty 1

## 2023-09-08 MED ORDER — PANTOPRAZOLE SODIUM 40 MG PO TBEC: 40.0000 mg | DELAYED_RELEASE_TABLET | Freq: Every day | ORAL | Status: DC

## 2023-09-08 MED ORDER — METOPROLOL SUCCINATE ER 25 MG PO TB24
12.5000 mg | ORAL_TABLET | Freq: Every day | ORAL | Status: DC
  Administered 2023-09-09 – 2023-09-12 (×4): 12.5 mg via ORAL
  Filled 2023-09-08 (×4): qty 1

## 2023-09-08 NOTE — Progress Notes (Signed)
PMR Admission Coordinator Pre-Admission Assessment   Patient: Mark Mack is an 67 y.o., male MRN: [id number] DOB: [birth year] Height: 5\' 6"  (167.6 cm) Weight: 68.7 kg                                                                                                              Insurance Information HMO:     PPO: yes     PCP:      IPA:      80/20:      OTHER:  PRIMARY: [employer] Medicare      Policy#: [id number]      Subscriber: pt CM Name: Ladonna Snide      Phone#: [phone]     Fax#: [phone] Pre-Cert#: [id number] approved  09/08/23-09/14/23      Employer:  Benefits:  Phone #: [phone]     Name: 11/8 Eff. Date: 10/28/22     Deduct: none      Out of Pocket Max: $4500      Life Max: none  CIR: $295 co pay per day days 1 until 6      SNF: no co pay days 1 until 20; $203 co pay per day days 21 until 100 Outpatient: $20 per visit     Co-Pay:  Home Health: 100%      Co-Pay:  DME: 80%     Co-Pay: 20% Providers: in network  SECONDARY: none   Financial Counselor:       Phone#:    The Data processing manager" for patients in Inpatient Rehabilitation Facilities with attached "Privacy Act Statement-Health Care Records" was provided and verbally reviewed with: Patient and Family   Emergency Contact Information Contact Information   None on File      Other Contacts       Name Relation Home Work Mobile    Pelfrey,Shirley Spouse [phone]             Current Medical History  Patient Admitting Diagnosis: cardiac arrest   History of Present Illness: 67 year old male past medical history of CAD with 4 vessel CABG in 2019. Also history of HTN and smoking.  Witnessed cardiac arrest at home and presented to [facility] on 11/3.CPR initiated from home. EKG with notable inferior STEMI and emergently taken to the cath lab.    LHC Cath showed occlusions in multiple vessels, though wire could not be passed and no revascularization was performed. Underwent RHC with noted cardiac output of 2.5 and  cardiac index of 2.1.Required multiple vasopressors, now weaned. Echo demonstrated preserved EF. Began Losartan for BP control and continue metoprolol. Will hold ASA, and plavix due to alveolar hemorrhage. Will monitor HGB to determine when to start. Will add statin once LFTS improve. Initially intubated and then extubated on 11/5. Bronch done 11/3 which demonstrated alveolar hemorrhage likely due to pulmonary contusion. Cr 1.24 on admit and slightly elevate to 1.57. Will monitor renal function daily.    Patient's medical record from [facility] has been reviewed by the rehabilitation admission coordinator and physician.   Past Medical  History      Past Medical History:  Diagnosis Date   Hypertension          Has the patient had major surgery during 100 days prior to admission? Yes   Family History  family history is not on file.   Current Medications   Current Medications    Current Facility-Administered Medications:    Chlorhexidine Gluconate Cloth 2 % PADS 6 each, 6 each, Topical, Q0600, Dgayli, Khabib, MD, 6 each at 09/05/23 0545   docusate sodium (COLACE) capsule 100 mg, 100 mg, Oral, BID PRN, [name], MD   feeding supplement (KATE FARMS STANDARD 1.4) liquid 325 mL, 325 mL, Oral, TID BM, Jonah Blue, MD, 325 mL at 09/05/23 1405   folic acid (FOLVITE) tablet 1 mg, 1 mg, Oral, Daily, Lowella Bandy, RPH, 1 mg at 09/05/23 0936   insulin aspart (novoLOG) injection 0-6 Units, 0-6 Units, Subcutaneous, Q4H, Lowella Bandy, RPH, 1 Units at 09/05/23 6213   lactated ringers infusion, , Intravenous, Continuous, Jonah Blue, MD   LORazepam (ATIVAN) tablet 1-4 mg, 1-4 mg, Oral, Q1H PRN, Rust-Chester, Cecelia Byars, NP   losartan (COZAAR) tablet 25 mg, 25 mg, Oral, Daily, Hudson, Caralyn, PA-C, 25 mg at 09/05/23 1256   metoprolol succinate (TOPROL-XL) 24 hr tablet 12.5 mg, 12.5 mg, Oral, Daily, Hudson, Caralyn, PA-C, 12.5 mg at 09/05/23 0865   multivitamin with minerals tablet 1 tablet, 1  tablet, Oral, Daily, Jonah Blue, MD, 1 tablet at 09/05/23 0936   nitroGLYCERIN (NITROSTAT) SL tablet 0.4 mg, 0.4 mg, Sublingual, Q5 min PRN, Jonah Blue, MD   ondansetron [facility]) injection 4 mg, 4 mg, Intravenous, Q6H PRN, Callwood, Dwayne D, MD   Oral care mouth rinse, 15 mL, Mouth Rinse, PRN, Rust-Chester, Micheline Rough L, NP   pantoprazole (PROTONIX) EC tablet 40 mg, 40 mg, Oral, QHS, Lowella Bandy, RPH, 40 mg at 09/04/23 2106   piperacillin-tazobactam (ZOSYN) IVPB 3.375 g, 3.375 g, Intravenous, Q8H, Jonah Blue, MD, Last Rate: 12.5 mL/hr at 09/05/23 1405, 3.375 g at 09/05/23 1405   polyethylene glycol (MIRALAX / GLYCOLAX) packet 17 g, 17 g, Oral, Daily PRN, Aundria Rud, Khabib, MD   sodium chloride (OCEAN) 0.65 % nasal spray 1 spray, 1 spray, Each Nare, PRN, Rust-Chester, Britton L, NP, 1 spray at 09/03/23 0232   thiamine (VITAMIN B1) tablet 100 mg, 100 mg, Oral, Daily, Lowella Bandy, RPH, 100 mg at 09/05/23 0936   traZODone (DESYREL) tablet 50 mg, 50 mg, Oral, QHS, Rust-Chester, Britton L, NP, 50 mg at 09/04/23 2106     Patients Current Diet:  Diet Order                  DIET DYS 3 Room service appropriate? Yes with Assist; Fluid consistency: Thin  Diet effective now                       Precautions / Restrictions Precautions Precautions: Fall Precaution Comments: monitor Sp02/ RR Restrictions Weight Bearing Restrictions: No    Has the patient had 2 or more falls or a fall with injury in the past year?No   Prior Activity Level Community (5-7x/wk): independent and retired   Prior Functional Level Prior Function Prior Level of Function : Independent/Modified Independent Mobility Comments: retired, independent and active (was working on a car recently) ADLs Comments: independent   Self Care: Did the patient need help bathing, dressing, using the toilet or eating?  Independent   Indoor Mobility: Did the patient  need assistance with walking from room to room (with  or without device)? Independent   Stairs: Did the patient need assistance with internal or external stairs (with or without device)? Independent   Functional Cognition: Did the patient need help planning regular tasks such as shopping or remembering to take medications? Independent   Patient Information Are you of Hispanic, Latino/a,or Spanish origin?: A. No, not of Hispanic, Latino/a, or Spanish origin What is your race?: B. Black or African American Do you need or want an interpreter to communicate with a doctor or health care staff?: 0. No   Patient's Response To:  Health Literacy and Transportation Is the patient able to respond to health literacy and transportation needs?: Yes Health Literacy - How often do you need to have someone help you when you read instructions, pamphlets, or other written material from your doctor or pharmacy?: Never In the past 12 months, has lack of transportation kept you from medical appointments or from getting medications?: No In the past 12 months, has lack of transportation kept you from meetings, work, or from getting things needed for daily living?: No   Home Assistive Devices / Equipment   Prior Device Use: Indicate devices/aids used by the patient prior to current illness, exacerbation or injury? None of the above   Current Functional Level Cognition   Overall Cognitive Status: Impaired/Different from baseline Orientation Level: Oriented X4 Following Commands: Follows one step commands consistently, Follows multi-step commands inconsistently Safety/Judgement: Decreased awareness of safety, Decreased awareness of deficits General Comments: Pt eager to participate in session, motivated to get better.    Extremity Assessment (includes Sensation/Coordination)   Upper Extremity Assessment: Generalized weakness  Lower Extremity Assessment: Generalized weakness     ADLs   Overall ADL's : Needs assistance/impaired Grooming: Wash/dry face, Wash/dry  hands, Minimal assistance, Sitting (using oral suction) Grooming Details (indicate cue type and reason): vcs for completion Lower Body Dressing: Maximal assistance, Bed level Lower Body Dressing Details (indicate cue type and reason): socks Toileting- Clothing Manipulation and Hygiene: Maximal assistance, Sit to/from stand, Cueing for sequencing Toileting - Clothing Manipulation Details (indicate cue type and reason): Pt soiled on arrival to room, pt unaware     Mobility   Overal bed mobility: Needs Assistance Bed Mobility: Supine to Sit Supine to sit: Min assist, Mod assist, HOB elevated, Used rails Sit to supine: Mod assist General bed mobility comments: in recliner pre/post session     Transfers   Overall transfer level: Needs assistance Equipment used: Rolling walker (2 wheels) Transfers: Sit to/from Stand Sit to Stand: Contact guard assist, Min assist General transfer comment: pt requires CGA-MIN assist to stand from recliner with vcs for handplacement, fwd wt shift, and overall sequencing improvements     Ambulation / Gait / Stairs / Wheelchair Mobility   Ambulation/Gait Ambulation/Gait assistance: Contact guard assist, +2 safety/equipment Gait Distance (Feet): 30 Feet Assistive device: Rolling walker (2 wheels) Gait Pattern/deviations: Step-through pattern, Trunk flexed General Gait Details: pt ambulated on 2 L o2  2 x ~ 30 ft with chair follow for additional safety. Pt has one episode of coughing up small amount of blood but per SLP has been since admission. Gait velocity: decreased Pre-gait activities: Mod A +2 person for weight shifting and marching in place. Standing activity tolerance limited for progression of ambulation     Posture / Balance Balance Overall balance assessment: Needs assistance Sitting-balance support: Feet supported, No upper extremity supported Sitting balance-Leahy Scale: Fair Standing balance support: Bilateral upper extremity  supported, Reliant on  assistive device for balance, During functional activity Standing balance-Leahy Scale: Fair Standing balance comment: external support required High Level Balance Comments: When taking marching in place; pt required MOD A + 2 for stability and balance     Special needs/care consideration          Previous Home Environment  Living Arrangements: Spouse/significant other  Lives With: Spouse Available Help at Discharge:  (wife works, dtr works from home and grandson can help) Type of Home: House Home Layout: One level Home Access: Stairs to enter Secretary/administrator of Steps: 2 Bathroom Shower/Tub: Engineer, manufacturing systems: Standard Bathroom Accessibility: Yes How Accessible: Accessible via walker Home Care Services: No   Discharge Living Setting Plans for Discharge Living Setting: Patient's home, Lives with (comment) (wife) Type of Home at Discharge: House Discharge Home Layout: One level Discharge Home Access: Stairs to enter Entrance Stairs-Rails: None Entrance Stairs-Number of Steps: 2 Discharge Bathroom Shower/Tub: Tub/shower unit Discharge Bathroom Toilet: Standard Discharge Bathroom Accessibility: Yes How Accessible: Accessible via walker Does the patient have any problems obtaining your medications?: No   Social/Family/Support Systems Patient Roles: Spouse, Parent Contact Information: wife,Shirley Anticipated Caregiver: wife, daughter and grandson Anticipated Industrial/product designer Information: see contacts Ability/Limitations of Caregiver: wife works Engineer, structural Availability: 24/7 Discharge Plan Discussed with Primary Caregiver: Yes Is Caregiver In Agreement with Plan?: Yes Does Caregiver/Family have Issues with Lodging/Transportation while Pt is in Rehab?: No   Goals Patient/Family Goal for Rehab: Mod I to supervision with PT, OT and SLP Expected length of stay: ELOS 5 to 7 days Pt/Family Agrees to Admission and willing to participate: Yes Program  Orientation Provided & Reviewed with Pt/Caregiver Including Roles  & Responsibilities: Yes   Decrease burden of Care through IP rehab admission: n/a   Possible need for SNF placement upon discharge:not anticipated   Patient Condition: This patient's medical and functional status has remained the same since the consult dated: 09/05/23 in which the Rehabilitation Physician determined and documented that the patient's condition is appropriate for intensive rehabilitative care in an inpatient rehabilitation facility. See "History of Present Illness" (above) for medical update. Functional changes are: patient needs min A for ambulation 30 feet and minA for transfers +2 for safety. Patient's medical and functional status update has been discussed with the Rehabilitation physician and patient remains appropriate for inpatient rehabilitation. Will admit to inpatient rehab today.   Preadmission Screen Completed By:  Clois Dupes, RN MSN 09/05/2023 3:15 PM, Updates provided by Lissa Merlin, PT 09/08/23, 12:00 pm ______________________________________________________________________   Discussed status with Dr. Riley Kill on 09/08/23 at 12:00 pm and received approval for admission today.   Admission Coordinator:  Clois Dupes, RN MSN updates provided by Lissa Merlin, PT time12:36 pm/Date11/11/24.

## 2023-09-08 NOTE — Progress Notes (Signed)
Occupational Therapy Treatment Patient Details Name: Mark Mack MRN: [id number] DOB: Mar 07, 1956 Today's Date: 09/08/2023   History of present illness Patient is a 67 year old male presenting with cardiac arrest at home, CPR initiated at home prior to EMS. Found to have inferior STEMI s/p cath lab for [facility]. Patient was intubated and was extubated on 11/5.   OT comments  Pt seen for OT treatment on this date. Upon arrival to room pt seated at EOB, agreeable to tx. Pt's wife present during the session. Pt ambulated to the bathroom and completed a toilet t/f with supervision+RW. Pt reported some lightheadedness, pt monitored during session. Pt on 3L/min via [place] t/o session, O2 monitored all t/o. Pt took breaks as needed. Pt returned to bed and did all bed mobility with supervision. Pt left supine in bed with call bell within reach and all needs met. Pt required verbal cueing for sequencing and proper RW use. Pt making good progress toward goals, will continue to follow POC. Discharge recommendation remains appropriate.        If plan is discharge home, recommend the following:  A lot of help with walking and/or transfers;Help with stairs or ramp for entrance;Assist for transportation;Direct supervision/assist for medications management;Direct supervision/assist for financial management;A lot of help with bathing/dressing/bathroom;Assistance with cooking/housework;Assistance with feeding   Equipment Recommendations       Recommendations for Other Services      Precautions / Restrictions Precautions Precautions: Fall Precaution Comments: monitor Sp02 Restrictions Weight Bearing Restrictions: No       Mobility Bed Mobility Overal bed mobility: Needs Assistance Bed Mobility: Sit to Supine       Sit to supine: Supervision     Patient Response: Cooperative  Transfers Overall transfer level: Needs assistance Equipment used: Rolling walker (2 wheels) Transfers: Sit to/from Stand Sit  to Stand: Supervision                 Balance Overall balance assessment: Needs assistance Sitting-balance support: Feet supported, No upper extremity supported Sitting balance-Leahy Scale: Good     Standing balance support: Bilateral upper extremity supported, Reliant on assistive device for balance, During functional activity Standing balance-Leahy Scale: Good Standing balance comment: overall does well but limited by bouts of dizziness                           ADL either performed or assessed with clinical judgement   ADL Overall ADL's : Needs assistance/impaired                                     Functional mobility during ADLs: Supervision/safety;Set up;Rolling walker (2 wheels);Cueing for sequencing;Cueing for safety General ADL Comments: No physical assistance required, step by step verbal cues for sequencing, cueing for proper RW use. Pt ambulated to the bathroom and completed a toilet t/f with supervision+RW.    Extremity/Trunk Assessment Upper Extremity Assessment Upper Extremity Assessment: Generalized weakness   Lower Extremity Assessment Lower Extremity Assessment: Generalized weakness   Cervical / Trunk Assessment Cervical / Trunk Assessment: Normal    Vision       Perception     Praxis      Cognition Arousal: Alert Behavior During Therapy: WFL for tasks assessed/performed Overall Cognitive Status: Within Functional Limits for tasks assessed  Exercises      Shoulder Instructions       General Comments      Pertinent Vitals/ Pain       Pain Assessment Pain Assessment: No/denies pain  Home Living                                          Prior Functioning/Environment              Frequency  Min 1X/week        Progress Toward Goals  OT Goals(current goals can now be found in the care plan section)  Progress towards OT  goals: Progressing toward goals     Plan      Co-evaluation                 AM-PAC OT "6 Clicks" Daily Activity     Outcome Measure   Help from another person eating meals?: A Little Help from another person taking care of personal grooming?: A Little Help from another person toileting, which includes using toliet, bedpan, or urinal?: A Lot Help from another person bathing (including washing, rinsing, drying)?: A Lot Help from another person to put on and taking off regular upper body clothing?: A Little Help from another person to put on and taking off regular lower body clothing?: A Lot 6 Click Score: 15    End of Session Equipment Utilized During Treatment: Oxygen;Rolling walker (2 wheels)  OT Visit Diagnosis: Other abnormalities of gait and mobility (R26.89);Muscle weakness (generalized) (M62.81)   Activity Tolerance Patient tolerated treatment well   Patient Left in chair;with call bell/phone within reach;with nursing/sitter in room;with chair alarm set   Nurse Communication Mobility status        Time: [id number] OT Time Calculation (min): 23 min  Charges:    Butch Penny, SOT

## 2023-09-08 NOTE — H&P (Deleted)
Physical Medicine and Rehabilitation Admission H&P        Chief Complaint  Patient presents with   Functional deficits due to ABI      HPI:  Mark Mack is a 67 year old male with history of CAD s/p CABG 2019, HTN, ETOH abuse, tobacco use who was admitted to [facility] on 08/31/23 with witnessed cardiac arrest with multiple rounds of CPR with ROSC.  UDS positive for THC, cocaine and benzo. He was found to have inferior STEMI and taken to cath lab for [facility] showing occulusion in ostial circumflex and proximal and mid right coronaries which were unable to be crossed per Dr. Juliann Pares. He was started on IV heparin but developed alveolar hemorrhage requiring bronch with therapeutic aspiration of blood from right and left main bronchi felt to be due to lung contusion from CPR/Pulmonary edema and required bivalirudin as well as stress dose steroids. CTA chest was negative for PE but showed consolidation of BLL, central and upper lung secondary to aspiration and pulmonary edema as well as concerns of hemorrhagic pancreatitis. [name] He was started on IV Zosyn, milrionine and required multiple pressors. Plavix/heparin held due to bleed and abnormal LFTs due to shocked liver being monitored.    EEG done due to comatose state and showed moderate diffuse encephalopathy. He tolerated extubation  to [facility] 11/06 and post arrest encephalopathy improving therefore no need for MRI brain per Dr. Amada Jupiter. 2 D echo showed EF 60-65% with mild to moderate PVR. CT chest/abdomen/pelvis done 11/05 showed persistent peripancreatic stranding  c/w acute pancreatitis, improvement in aspiration and signs of acute tubular necrosis.  Swallow evaluation showed concerns of aspiration and increased WOB with intake therefore diet slowly initiated to D3, thins and no straws due to aspiration risk.  Electrolyte abnormalities with AKI resolving and hypokalemia supplemented intermittently. He was trated with CIWA protocol and delirium has  resolved.        Review of Systems  Constitutional:  Negative for fever.  HENT:  Negative for hearing loss.   Respiratory:  Positive for cough. Negative for shortness of breath.        Reports blood is sputum is getting better.   Cardiovascular:  Positive for chest pain. Negative for palpitations.  Gastrointestinal:  Negative for constipation and heartburn.  Genitourinary:  Negative for dysuria.  Musculoskeletal:  Negative for joint pain and myalgias.  Skin:  Negative for rash.  Neurological:  Positive for weakness.  Psychiatric/Behavioral:  The patient does not have insomnia.           Past Medical History:  Diagnosis Date   CAD (coronary artery disease)     Hypertension     OA (osteoarthritis) of hip      bilateral hips   Tobacco use                 Past Surgical History:  Procedure Laterality Date   CARDIAC SURGERY       CORONARY/GRAFT ACUTE MI REVASCULARIZATION N/A 08/31/2023    Procedure: Coronary/Graft Acute MI Revascularization;  Surgeon: Alwyn Pea, MD;  Location: [facility] INVASIVE CV LAB;  Service: Cardiovascular;  Laterality: N/A;   hip replacement Right     LEFT HEART CATH AND CORONARY ANGIOGRAPHY N/A 08/31/2023    Procedure: LEFT HEART CATH AND CORONARY ANGIOGRAPHY;  Surgeon: Alwyn Pea, MD;  Location: [facility] INVASIVE CV LAB;  Service: Cardiovascular;  Laterality: N/A;   TOTAL HIP ARTHROPLASTY Left 2021  No family history on file.         Social History:  Married. Retired a year ago--walks/golfs once a week. He  reports that he smokes about 5 cigarettes per day. He has never used smokeless tobacco. He drinks 6 beers/day.  He was positive for cocaine and marijuana--only when he parties.      Allergies:  Allergies  No Known Allergies             Medications Prior to Admission  Medication Sig Dispense Refill   acetaminophen (TYLENOL) 500 MG tablet Take 1,000 mg by mouth every 6 (six) hours as needed.        [START ON 09/09/2023]  aspirin 81 MG chewable tablet Chew 1 tablet (81 mg total) by mouth daily.       [START ON 09/09/2023] atorvastatin (LIPITOR) 40 MG tablet Take 1 tablet (40 mg total) by mouth daily.       [START ON 09/09/2023] clopidogrel (PLAVIX) 75 MG tablet Take 1 tablet (75 mg total) by mouth daily.       docusate sodium (COLACE) 100 MG capsule Take 1 capsule (100 mg total) by mouth 2 (two) times daily as needed for mild constipation.       [START ON 09/09/2023] folic acid (FOLVITE) 1 MG tablet Take 1 tablet (1 mg total) by mouth daily.       [START ON 09/09/2023] losartan (COZAAR) 25 MG tablet Take 1 tablet (25 mg total) by mouth daily.       [START ON 09/09/2023] metoprolol succinate (TOPROL-XL) 25 MG 24 hr tablet Take 0.5 tablets (12.5 mg total) by mouth daily.       [START ON 09/09/2023] Multiple Vitamin (MULTIVITAMIN WITH MINERALS) TABS tablet Take 1 tablet by mouth daily.       Nutritional Supplements (FEEDING SUPPLEMENT, KATE FARMS STANDARD 1.4,) LIQD liquid Take 325 mLs by mouth 3 (three) times daily between meals.       pantoprazole (PROTONIX) 40 MG tablet Take 1 tablet (40 mg total) by mouth at bedtime.       polyethylene glycol (MIRALAX / GLYCOLAX) 17 g packet Take 17 g by mouth daily as needed for moderate constipation.       QUEtiapine (SEROQUEL) 25 MG tablet Take 1 tablet (25 mg total) by mouth at bedtime.       [START ON 09/09/2023] thiamine (VITAMIN B-1) 100 MG tablet Take 1 tablet (100 mg total) by mouth daily.       traZODone (DESYREL) 50 MG tablet Take 1 tablet (50 mg total) by mouth at bedtime.                  Home: Home Living Family/patient expects to be discharged to:: Private residence Living Arrangements: Spouse/significant other Available Help at Discharge:  (wife works, dtr works from home and grandson can help) Type of Home: House Home Access: Stairs to enter Secretary/administrator of Steps: 2 Home Layout: One level Bathroom Shower/Tub: Engineer, manufacturing systems:  Pharmacist, community: Yes  Lives With: Spouse   Functional History: Prior Function Prior Level of Function : Independent/Modified Independent Mobility Comments: retired, independent and active (was working on a car recently) ADLs Comments: independent   Functional Status:  Mobility: Bed Mobility Overal bed mobility: Needs Assistance Bed Mobility: Sit to Supine Supine to sit: Min assist, Mod assist, HOB elevated, Used rails Sit to supine: Supervision General bed mobility comments: NT pt in recliner pre/post tx Transfers Overall transfer level: Needs assistance Equipment used:  Rolling walker (2 wheels) Transfers: Sit to/from Stand Sit to Stand: Supervision General transfer comment: multiple transfers during session for hand placement education and carryover Ambulation/Gait Ambulation/Gait assistance: Contact guard assist, +2 safety/equipment Gait Distance (Feet): 30 Feet Assistive device: Rolling walker (2 wheels) Gait Pattern/deviations: Step-through pattern, Trunk flexed General Gait Details: deferred.  focused on functional mobility skills, balance and ex. Gait velocity: decreased Pre-gait activities: Mod A +2 person for weight shifting and marching in place. Standing activity tolerance limited for progression of ambulation   ADL: ADL Overall ADL's : Needs assistance/impaired Grooming: Oral care, Standing, Cueing for sequencing, Cueing for safety Grooming Details (indicate cue type and reason): Sink level Lower Body Dressing: Maximal assistance, Bed level Lower Body Dressing Details (indicate cue type and reason): socks Toileting- Clothing Manipulation and Hygiene: Maximal assistance, Sit to/from stand, Cueing for sequencing Toileting - Clothing Manipulation Details (indicate cue type and reason): Pt aware of being soiled on this date Functional mobility during ADLs: Supervision/safety, Set up, Rolling walker (2 wheels), Cueing for sequencing, Cueing for  safety General ADL Comments: No physical assistance required, step by step verbal cues for sequencing, cueing for proper RW use. Pt ambulated to the bathroom and completed a toilet t/f with supervision+RW.   Cognition: Cognition Overall Cognitive Status: Within Functional Limits for tasks assessed Orientation Level: Oriented X4 Cognition Arousal: Alert Behavior During Therapy: WFL for tasks assessed/performed Overall Cognitive Status: Within Functional Limits for tasks assessed Area of Impairment: Orientation, Memory, Following commands, Safety/judgement, Problem solving Orientation Level: Situation, Disoriented to Memory: Decreased short-term memory, Decreased recall of precautions Following Commands: Follows multi-step commands inconsistently Safety/Judgement: Decreased awareness of safety, Decreased awareness of deficits Problem Solving: Slow processing, Decreased initiation, Difficulty sequencing, Requires verbal cues, Requires tactile cues General Comments: Pt highly motivated throughout session     Blood pressure 127/77, pulse 86, temperature 98.7 F (37.1 C), resp. rate 20, height 5\' 6"  (1.676 m), weight 69 kg, SpO2 98%. Physical Exam Vitals and nursing note reviewed.  Constitutional:      Appearance: Normal appearance.     Comments: Noted to be splinting at times due to mid sternal chest pain at site of burn from defibrillator?  HENT:     Head: Normocephalic.     Nose: Nose normal.  Eyes:     Extraocular Movements: Extraocular movements intact.     Conjunctiva/sclera: Conjunctivae normal.     Pupils: Pupils are equal, round, and reactive to light.  Cardiovascular:     Rate and Rhythm: Tachycardia present.     Heart sounds: No murmur heard.    No gallop.  Pulmonary:     Effort: Pulmonary effort is normal. No respiratory distress.     Breath sounds: Rales (at bases) present. No wheezing.     Comments: O2 6L via [facility] Abdominal:     General: There is distension.      Tenderness: There is no abdominal tenderness.  Musculoskeletal:        General: No swelling, tenderness, deformity or signs of injury.     Cervical back: Normal range of motion.  Skin:    General: Skin is warm and dry.     Comments: Erythema right forearm around prior OS site. Granulation tissue on sternum  Neurological:     Mental Status: He is alert and oriented to person, place, and time.     Comments: Slow speech. Able to answer orientation questions and follow simple motor commands. Fair insight and awareness. No focal CN signs. Normal language  and speech. MMT: UE grossly 4/5 prox til 4+/5 distally. LE: 4/5 prox to distal bilaterally. Good sitting balance. No sensory abnl appreciated. DTR's 1+  Psychiatric:     Comments: Pt flat but cooperative        Lab Results Last 48 Hours        Results for orders placed or performed during the hospital encounter of 08/31/23 (from the past 48 hour(s))  Glucose, capillary     Status: None    Collection Time: 09/06/23  4:35 PM  Result Value Ref Range    Glucose-Capillary 92 70 - 99 mg/dL      Comment: Glucose reference range applies only to samples taken after fasting for at least 8 hours.  Glucose, capillary     Status: None    Collection Time: 09/06/23  9:34 PM  Result Value Ref Range    Glucose-Capillary 89 70 - 99 mg/dL      Comment: Glucose reference range applies only to samples taken after fasting for at least 8 hours.  Glucose, capillary     Status: None    Collection Time: 09/07/23 12:27 AM  Result Value Ref Range    Glucose-Capillary 82 70 - 99 mg/dL      Comment: Glucose reference range applies only to samples taken after fasting for at least 8 hours.  Glucose, capillary     Status: None    Collection Time: 09/07/23  3:00 AM  Result Value Ref Range    Glucose-Capillary 85 70 - 99 mg/dL      Comment: Glucose reference range applies only to samples taken after fasting for at least 8 hours.  Phosphorus     Status: None     Collection Time: 09/07/23  6:09 AM  Result Value Ref Range    Phosphorus 3.1 2.5 - 4.6 mg/dL      Comment: Performed at [facility], [address] [place], [place] [address]  Magnesium     Status: None    Collection Time: 09/07/23  6:09 AM  Result Value Ref Range    Magnesium 2.2 1.7 - 2.4 mg/dL      Comment: Performed at [facility], [address] [place], [place] [address]  CBC with Differential/Platelet     Status: Abnormal    Collection Time: 09/07/23  6:09 AM  Result Value Ref Range    WBC 15.6 (H) 4.0 - 10.5 K/uL    RBC 3.46 (L) 4.22 - 5.81 MIL/uL    Hemoglobin 9.7 (L) 13.0 - 17.0 g/dL    HCT 24.4 (L) 01.0 - 52.0 %    MCV 86.1 80.0 - 100.0 fL    MCH 28.0 26.0 - 34.0 pg    MCHC 32.6 30.0 - 36.0 g/dL    RDW 27.2 53.6 - 64.4 %    Platelets 200 150 - 400 K/uL    nRBC 1.1 (H) 0.0 - 0.2 %    Neutrophils Relative % 76 %    Neutro Abs 11.9 (H) 1.7 - 7.7 K/uL    Lymphocytes Relative 11 %    Lymphs Abs 1.8 0.7 - 4.0 K/uL    Monocytes Relative 9 %    Monocytes Absolute 1.3 (H) 0.1 - 1.0 K/uL    Eosinophils Relative 2 %    Eosinophils Absolute 0.2 0.0 - 0.5 K/uL    Basophils Relative 0 %    Basophils Absolute 0.1 0.0 - 0.1 K/uL    Immature Granulocytes 2 %    Abs  Immature Granulocytes 0.31 (H) 0.00 - 0.07 K/uL      Comment: Performed at [facility], [address] [place], [place] [address]  Comprehensive metabolic panel     Status: Abnormal    Collection Time: 09/07/23  6:09 AM  Result Value Ref Range    Sodium 143 135 - 145 mmol/L    Potassium 3.5 3.5 - 5.1 mmol/L    Chloride 114 (H) 98 - 111 mmol/L    CO2 20 (L) 22 - 32 mmol/L    Glucose, Bld 105 (H) 70 - 99 mg/dL      Comment: Glucose reference range applies only to samples taken after fasting for at least 8 hours.    BUN 26 (H) 8 - 23 mg/dL    Creatinine, Ser 5.78 0.61 - 1.24 mg/dL    Calcium 8.0 (L) 8.9 - 10.3 mg/dL    Total Protein 5.8 (L) 6.5 - 8.1 g/dL    Albumin 2.6 (L) 3.5 -  5.0 g/dL    AST 45 (H) 15 - 41 U/L    ALT 53 (H) 0 - 44 U/L    Alkaline Phosphatase 76 38 - 126 U/L    Total Bilirubin 0.8 <1.2 mg/dL    GFR, Estimated >46 >96 mL/min      Comment: (NOTE) Calculated using the CKD-EPI Creatinine Equation (2021)      Anion gap 9 5 - 15      Comment: Performed at [facility], [address] [place], [place] [address]  Glucose, capillary     Status: None    Collection Time: 09/07/23  8:01 AM  Result Value Ref Range    Glucose-Capillary 92 70 - 99 mg/dL      Comment: Glucose reference range applies only to samples taken after fasting for at least 8 hours.  Glucose, capillary     Status: Abnormal    Collection Time: 09/07/23 11:45 AM  Result Value Ref Range    Glucose-Capillary 133 (H) 70 - 99 mg/dL      Comment: Glucose reference range applies only to samples taken after fasting for at least 8 hours.  Glucose, capillary     Status: None    Collection Time: 09/07/23  3:35 PM  Result Value Ref Range    Glucose-Capillary 92 70 - 99 mg/dL      Comment: Glucose reference range applies only to samples taken after fasting for at least 8 hours.  Glucose, capillary     Status: Abnormal    Collection Time: 09/07/23  9:04 PM  Result Value Ref Range    Glucose-Capillary 113 (H) 70 - 99 mg/dL      Comment: Glucose reference range applies only to samples taken after fasting for at least 8 hours.  Glucose, capillary     Status: Abnormal    Collection Time: 09/08/23 12:12 AM  Result Value Ref Range    Glucose-Capillary 150 (H) 70 - 99 mg/dL      Comment: Glucose reference range applies only to samples taken after fasting for at least 8 hours.  Glucose, capillary     Status: None    Collection Time: 09/08/23  5:51 AM  Result Value Ref Range    Glucose-Capillary 92 70 - 99 mg/dL      Comment: Glucose reference range applies only to samples taken after fasting for at least 8 hours.  CBC with Differential/Platelet     Status: Abnormal    Collection Time:  09/08/23  7:46 AM  Result Value Ref Range    WBC 21.7 (H) 4.0 - 10.5 K/uL    RBC 3.51 (L) 4.22 - 5.81 MIL/uL    Hemoglobin 9.8 (L) 13.0 - 17.0 g/dL    HCT 25.9 (L) 56.3 - 52.0 %    MCV 85.5 80.0 - 100.0 fL    MCH 27.9 26.0 - 34.0 pg    MCHC 32.7 30.0 - 36.0 g/dL    RDW 87.5 (H) 64.3 - 15.5 %    Platelets 295 150 - 400 K/uL    nRBC 0.4 (H) 0.0 - 0.2 %    Neutrophils Relative % 84 %    Neutro Abs 18.2 (H) 1.7 - 7.7 K/uL    Lymphocytes Relative 7 %    Lymphs Abs 1.6 0.7 - 4.0 K/uL    Monocytes Relative 6 %    Monocytes Absolute 1.4 (H) 0.1 - 1.0 K/uL    Eosinophils Relative 1 %    Eosinophils Absolute 0.3 0.0 - 0.5 K/uL    Basophils Relative 0 %    Basophils Absolute 0.1 0.0 - 0.1 K/uL    Immature Granulocytes 2 %    Abs Immature Granulocytes 0.33 (H) 0.00 - 0.07 K/uL      Comment: Performed at [facility], [address] [place], [place] [address]  Basic metabolic panel     Status: Abnormal    Collection Time: 09/08/23  7:46 AM  Result Value Ref Range    Sodium 139 135 - 145 mmol/L    Potassium 3.6 3.5 - 5.1 mmol/L    Chloride 112 (H) 98 - 111 mmol/L    CO2 19 (L) 22 - 32 mmol/L    Glucose, Bld 112 (H) 70 - 99 mg/dL      Comment: Glucose reference range applies only to samples taken after fasting for at least 8 hours.    BUN 24 (H) 8 - 23 mg/dL    Creatinine, Ser 8.84 0.61 - 1.24 mg/dL    Calcium 8.2 (L) 8.9 - 10.3 mg/dL    GFR, Estimated >16 >60 mL/min      Comment: (NOTE) Calculated using the CKD-EPI Creatinine Equation (2021)      Anion gap 8 5 - 15      Comment: Performed at [facility], [address] [place], [place] [address]  Glucose, capillary     Status: Abnormal    Collection Time: 09/08/23  9:02 AM  Result Value Ref Range    Glucose-Capillary 69 (L) 70 - 99 mg/dL      Comment: Glucose reference range applies only to samples taken after fasting for at least 8 hours.      Imaging Results (Last 48 hours)  No results found.          Blood pressure 127/77, pulse 86, temperature 98.7 F (37.1 C), resp. rate 20, height 5\' 6"  (1.676 m), weight 69 kg, SpO2 98%.   Medical Problem List and Plan: 1. 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  Antithrombotics: -DVT/anticoagulation:  Mechanical: Sequential compression devices, below knee Bilateral lower extremities             -antiplatelet therapy: DAPT 3. Pain Management: Tylenol prn.  4. Mood/Behavior/Sleep: LCSW  to follow for evaluation and  support.              --continue trazodone for sleep wake disruption.              -antipsychotic agents: Seroquel for delirium/sleep.  5. Neuropsych/cognition: This patient is not fully capable of making decisions on his own behalf. 6. Skin/Wound Care: routine pressure relief measures.              --Silvadene added to area on mid sternum.     7. Fluids/Electrolytes/Nutrition: Monitor I/O. Check CMET ina m.  8. STEMI/hx of CABG  : Jonne Ply resumed on 11/10 and Plavix resumed today.  --monitor for recurrent signs of bleeding.              --add low salt restrictions. Monitor for signs of overload             --on Metoprolol, losartan, DAPT, Litpitor,  9. Aspiration PNA: Treated with IV Zosyn X 7 days thru 11/09 10. Leucocytosis: Has had rise in WBC to 21.7-->monitor for fevers and other signs of infection             --Encourage pulmonary hygiene. Monitor for symptoms of pancreatitis.   11. ABLA: On DAPT since again since 11/10 and HH stable around 9.8.             --hemoptysis ongoing per patient --continue to monitor for trend with  plavix resumed on 11/11   12. Acute renal failure: Due to cardiogenic shock and resolving.  --BUN/SCr trend 15/1.24-->34/2.19-->24/1.11 --avoid nephrotoxic meds. Monitor renal status with serial checks 13. Abnormal  LFTs: Improving             -Continue to educate on cessation of ETOH/Drug use 14. Pancreatitis: Monitor for symptoms--amylase/lipase  739/149 @ admission --educate on importance of cessation of polysubstance use.  15. Thrombocytopenia: Has resolved. Recheck plateletes in am.        Jacquelynn Cree, PA-C 09/08/2023  I have personally performed a face to face diagnostic evaluation of this patient and formulated the key components of the plan.  Additionally, I have personally reviewed laboratory data, imaging studies, as well as relevant notes and concur with the physician assistant's documentation above.  The patient's status has not changed from the original H&P.  Any changes in documentation from the acute care chart have been noted above.  Ranelle Oyster, MD, [place]

## 2023-09-08 NOTE — Progress Notes (Signed)
Physical Medicine and Rehabilitation Consult Reason for Consult: Cardiac debility Referring Physician: Jonah Blue, MD     HPI: Mark Mack is a 67 y.o. male who presented with a cardiac arrest at home. CPR was initiated at home prior to EMS. He was found to have an inferior STEMI s/o cath lab for [facility]. Patient was intubated and extubated on 11/5. He is currently ambulating with CG +2 20 feet but is limited by chest pain and hemoptysis. Physical Medicine & Rehabilitation was consulted to assess candidacy for CIR.       ROS +chest pain, hemoptysis     Past Medical History:  Diagnosis Date   Hypertension               Past Surgical History:  Procedure Laterality Date   CARDIAC SURGERY       CORONARY/GRAFT ACUTE MI REVASCULARIZATION N/A 08/31/2023    Procedure: Coronary/Graft Acute MI Revascularization;  Surgeon: Alwyn Pea, MD;  Location: [facility] INVASIVE CV LAB;  Service: Cardiovascular;  Laterality: N/A;   LEFT HEART CATH AND CORONARY ANGIOGRAPHY N/A 08/31/2023    Procedure: LEFT HEART CATH AND CORONARY ANGIOGRAPHY;  Surgeon: Alwyn Pea, MD;  Location: [facility] INVASIVE CV LAB;  Service: Cardiovascular;  Laterality: N/A;        No family history on file.     Social History:  reports that he quit smoking about 5 years ago. He has never used smokeless tobacco. No history on file for alcohol use and drug use. Allergies:  Allergies  No Known Allergies         Medications Prior to Admission  Medication Sig Dispense Refill   acetaminophen (TYLENOL) 500 MG tablet Take 1,000 mg by mouth every 6 (six) hours as needed.        Telmisartan-amLODIPine 80-10 MG TABS Take 1 tablet by mouth daily.       metoprolol succinate (TOPROL XL) 50 MG 24 hr tablet Take 50 mg by mouth daily.               Home: Home Living Family/patient expects to be discharged to:: Private residence Living Arrangements: Spouse/significant other Available Help at Discharge: Family, Available 24  hours/day Type of Home: House Home Access: Stairs to enter Secretary/administrator of Steps: 2 Home Layout: One level  Functional History: Prior Function Prior Level of Function : Independent/Modified Independent Mobility Comments: retired, independent and active (was working on a car recently) ADLs Comments: independent Functional Status:  Mobility: Bed Mobility Overal bed mobility: Needs Assistance Bed Mobility: Supine to Sit Supine to sit: Min assist, Mod assist, HOB elevated, Used rails Sit to supine: Mod assist General bed mobility comments: in recliner pre/post session Transfers Overall transfer level: Needs assistance Equipment used: Rolling walker (2 wheels) Transfers: Sit to/from Stand Sit to Stand: Contact guard assist, Min assist General transfer comment: pt requires CGA-MIN assist to stand from recliner with vcs for handplacement, fwd wt shift, and overall sequencing improvements Ambulation/Gait Ambulation/Gait assistance: Contact guard assist, +2 safety/equipment Gait Distance (Feet): 30 Feet Assistive device: Rolling walker (2 wheels) Gait Pattern/deviations: Step-through pattern, Trunk flexed General Gait Details: pt ambulated on 2 L o2  2 x ~ 30 ft with chair follow for additional safety. Pt has one episode of coughing up small amount of blood but per SLP has been since admission. Gait velocity: decreased Pre-gait activities: Mod A +2 person for weight shifting and marching in place. Standing activity tolerance limited for progression of ambulation   ADL: ADL  Overall ADL's : Needs assistance/impaired Grooming: Wash/dry face, Wash/dry hands, Minimal assistance, Sitting (using oral suction) Grooming Details (indicate cue type and reason): vcs for completion Lower Body Dressing: Maximal assistance, Bed level Lower Body Dressing Details (indicate cue type and reason): socks Toileting- Clothing Manipulation and Hygiene: Maximal assistance, Sit to/from stand, Cueing  for sequencing Toileting - Clothing Manipulation Details (indicate cue type and reason): Pt soiled on arrival to room, pt unaware   Cognition: Cognition Overall Cognitive Status: Impaired/Different from baseline Orientation Level: Oriented X4 Cognition Arousal: Alert Behavior During Therapy: WFL for tasks assessed/performed Overall Cognitive Status: Impaired/Different from baseline Area of Impairment: Orientation, Memory, Following commands, Safety/judgement, Problem solving Orientation Level: Situation, Time, Disoriented to Memory: Decreased short-term memory, Decreased recall of precautions Following Commands: Follows one step commands consistently, Follows multi-step commands inconsistently Safety/Judgement: Decreased awareness of safety, Decreased awareness of deficits Problem Solving: Slow processing, Decreased initiation, Difficulty sequencing, Requires verbal cues, Requires tactile cues General Comments: Pt eager to participate in session, motivated to get better.   Blood pressure (!) 141/80, pulse 90, temperature 98.6 F (37 C), temperature source Oral, resp. rate (!) 48, height 5\' 6"  (1.676 m), weight 68.7 kg, SpO2 98%. Physical Exam Gen: no distress, normal appearing HEENT: oral mucosa pink and moist, NCAT,  in place Cardio: Reg rate Chest: increased effort of breathing Abd: soft, non-distended Ext: no edema Psych: pleasant, normal affect Skin: intact Neuro: Alert and oriented x3 Musculoskeletal: 5/5 strength   Lab Results Last 24 Hours       Results for orders placed or performed during the hospital encounter of 08/31/23 (from the past 24 hour(s))  Glucose, capillary     Status: Abnormal    Collection Time: 09/04/23  4:28 PM  Result Value Ref Range    Glucose-Capillary 173 (H) 70 - 99 mg/dL  Glucose, capillary     Status: None    Collection Time: 09/04/23  7:33 PM  Result Value Ref Range    Glucose-Capillary 96 70 - 99 mg/dL  Glucose, capillary     Status:  Abnormal    Collection Time: 09/04/23 11:45 PM  Result Value Ref Range    Glucose-Capillary 108 (H) 70 - 99 mg/dL  Glucose, capillary     Status: Abnormal    Collection Time: 09/05/23  3:58 AM  Result Value Ref Range    Glucose-Capillary 190 (H) 70 - 99 mg/dL  Glucose, capillary     Status: Abnormal    Collection Time: 09/05/23  7:41 AM  Result Value Ref Range    Glucose-Capillary 118 (H) 70 - 99 mg/dL  Hepatic function panel     Status: Abnormal    Collection Time: 09/05/23  9:14 AM  Result Value Ref Range    Total Protein 6.3 (L) 6.5 - 8.1 g/dL    Albumin 2.9 (L) 3.5 - 5.0 g/dL    AST 213 (H) 15 - 41 U/L    ALT 75 (H) 0 - 44 U/L    Alkaline Phosphatase 84 38 - 126 U/L    Total Bilirubin 1.3 (H) <1.2 mg/dL    Bilirubin, Direct 0.3 (H) 0.0 - 0.2 mg/dL    Indirect Bilirubin 1.0 (H) 0.3 - 0.9 mg/dL  Basic metabolic panel     Status: Abnormal    Collection Time: 09/05/23  9:20 AM  Result Value Ref Range    Sodium 148 (H) 135 - 145 mmol/L    Potassium 3.4 (L) 3.5 - 5.1 mmol/L    Chloride 115 (H)  98 - 111 mmol/L    CO2 25 22 - 32 mmol/L    Glucose, Bld 105 (H) 70 - 99 mg/dL    BUN 31 (H) 8 - 23 mg/dL    Creatinine, Ser 1.61 (H) 0.61 - 1.24 mg/dL    Calcium 8.0 (L) 8.9 - 10.3 mg/dL    GFR, Estimated 48 (L) >60 mL/min    Anion gap 8 5 - 15  Phosphorus     Status: None    Collection Time: 09/05/23  9:20 AM  Result Value Ref Range    Phosphorus 2.5 2.5 - 4.6 mg/dL  Magnesium     Status: Abnormal    Collection Time: 09/05/23  9:20 AM  Result Value Ref Range    Magnesium 2.8 (H) 1.7 - 2.4 mg/dL  CBC with Differential/Platelet     Status: Abnormal    Collection Time: 09/05/23  9:20 AM  Result Value Ref Range    WBC 16.8 (H) 4.0 - 10.5 K/uL    RBC 3.97 (L) 4.22 - 5.81 MIL/uL    Hemoglobin 11.2 (L) 13.0 - 17.0 g/dL    HCT 09.6 (L) 04.5 - 52.0 %    MCV 86.1 80.0 - 100.0 fL    MCH 28.2 26.0 - 34.0 pg    MCHC 32.7 30.0 - 36.0 g/dL    RDW 40.9 81.1 - 91.4 %    Platelets 115 (L)  150 - 400 K/uL    nRBC 3.4 (H) 0.0 - 0.2 %    Neutrophils Relative % 77 %    Neutro Abs 13.0 (H) 1.7 - 7.7 K/uL    Lymphocytes Relative 10 %    Lymphs Abs 1.6 0.7 - 4.0 K/uL    Monocytes Relative 10 %    Monocytes Absolute 1.6 (H) 0.1 - 1.0 K/uL    Eosinophils Relative 0 %    Eosinophils Absolute 0.0 0.0 - 0.5 K/uL    Basophils Relative 0 %    Basophils Absolute 0.1 0.0 - 0.1 K/uL    WBC Morphology MORPHOLOGY UNREMARKABLE      Smear Review Normal platelet morphology      Immature Granulocytes 3 %    Abs Immature Granulocytes 0.44 (H) 0.00 - 0.07 K/uL    Polychromasia PRESENT    Glucose, capillary     Status: Abnormal    Collection Time: 09/05/23 11:44 AM  Result Value Ref Range    Glucose-Capillary 137 (H) 70 - 99 mg/dL      Imaging Results (Last 48 hours)  No results found.     Assessment/Plan: Diagnosis: Cardiac debility Does the need for close, 24 hr/day medical supervision in concert with the patient's rehab needs make it unreasonable for this patient to be served in a less intensive setting? Yes Co-Morbidities requiring supervision/potential complications:              1) Hemoptysis             2) Chest pain             3) HTN: continue to monitor BP TID             4) GERD             5) HLD             6) insomnia: continue trazodone             7) Tachypnea: continue to monitor RR TID  8) Chest pain: messaged Dr. Ophelia Charter to see if prn nitroglycerin could be helpful Due to bladder management, bowel management, safety, skin/wound care, disease management, medication administration, pain management, and patient education, does the patient require 24 hr/day rehab nursing? Yes Does the patient require coordinated care of a physician, rehab nurse, therapy disciplines of PT, OT to address physical and functional deficits in the context of the above medical diagnosis(es)? Yes Addressing deficits in the following areas: balance, endurance, locomotion, strength,  transferring, bowel/bladder control, bathing, dressing, feeding, grooming, and toileting Can the patient actively participate in an intensive therapy program of at least 3 hrs of therapy per day at least 5 days per week? Yes The potential for patient to make measurable gains while on inpatient rehab is excellent Anticipated functional outcomes upon discharge from inpatient rehab are modified independent  with PT, modified independent with OT, independent with SLP. Estimated rehab length of stay to reach the above functional goals is: 5-7 days Anticipated discharge destination: Home Overall Rehab/Functional Prognosis: excellent   POST ACUTE RECOMMENDATIONS: This patient's condition is appropriate for continued rehabilitative care in the following setting: CIR Patient has agreed to participate in recommended program. Yes Note that insurance prior authorization may be required for reimbursement for recommended care.       I have personally performed a face to face diagnostic evaluation of this patient. Additionally, I have examined the patient's medical record including any pertinent labs and radiographic images. If the physician assistant has documented in this note, I have reviewed and edited or otherwise concur with the physician assistant's documentation.   Thanks,   Horton Chin, MD 09/05/2023

## 2023-09-08 NOTE — Progress Notes (Signed)
Patient ID: Mark Mack, male   DOB: 01-01-1956, 67 y.o.   MRN: [id number] Met with the patient to review current status, rehab schedule, team conference and plan of care. Discussed secondary risk management including HTN, HLD (LDL 57/Trig 171), CAD/CABG with ETOH and Substance misuse. Reviewed medications and dietary modification including nutritional supplements. Reviewed PCP/MyChart and given information on accessing records. Continue to follow along to address educational needs to facilitate preparation for discharge. Pamelia Hoit

## 2023-09-08 NOTE — Progress Notes (Signed)
Inpatient Rehabilitation Admission Medication Review by a Pharmacist  A complete drug regimen review was completed for this patient to identify any potential clinically significant medication issues.  High Risk Drug Classes Is patient taking? Indication by Medication  Antipsychotic Yes Compazine/seroquel - N/V  Anticoagulant No   Antibiotic No   Opioid No   Antiplatelet Yes Asa/plavix - CAD  Hypoglycemics/insulin No   Vasoactive Medication Yes Losartan/Toprol - HTN NTG - CP  Chemotherapy No   Other Yes Trazodone - sleep Folic acid/thiamine - supplement Atorvastatin - HLD Protonix - GERD     Type of Medication Issue Identified Description of Issue Recommendation(s)  Drug Interaction(s) (clinically significant)     Duplicate Therapy     Allergy     No Medication Administration End Date     Incorrect Dose     Additional Drug Therapy Needed     Significant med changes from prior encounter (inform family/care partners about these prior to discharge).    Other       Clinically significant medication issues were identified that warrant physician communication and completion of prescribed/recommended actions by midnight of the next day:  No  Name of provider notified for urgent issues identified:   Provider Method of Notification:     Pharmacist comments:   Time spent performing this drug regimen review (minutes):  20  Ulyses Southward, PharmD, [place], AAHIVP, CPP Infectious Disease Pharmacist 09/08/2023 12:49 PM

## 2023-09-08 NOTE — Progress Notes (Signed)
[facility] CARDIOLOGY PROGRESS NOTE   Patient ID: Mark Mack MRN: [id number] DOB/AGE: [birth year] 67 y.o.  Admit date: 08/31/2023 Primary Physician Hillery Hunter, Graylin Shiver, MD  Primary Cardiologist Dr. Hessie Dibble [facility] - last seen 2019)  HPI: Mark Mack is a 67 y.o. male with a past medical history of CAD s/p CABG x 4 2019, hypertension, hyperlipidemia who was brought to the ED on 08/31/2023 for witnessed cardiac arrest. When ROSC was obtained there was concern for inferolateral STEMI and patient was taken emergently to the cath lab.   Interval History:  -Patient reports he feels well this AM. Denies any chest pain or SOB. Remains on supplemental O2.  -Continues to work with PT, feels his strength is improving. He is eager to be discharged to rehab.  -BP and HR remain controlled. Tolerating medications well.    Review of systems complete and found to be negative unless listed above    Vitals:   09/08/23 0500 09/08/23 0524 09/08/23 0814 09/08/23 0933  BP:  (!) 141/85 127/77 127/77  Pulse:  77 86 86  Resp:  20 20   Temp:  99.1 F (37.3 C) 98.7 F (37.1 C)   TempSrc:  Oral    SpO2:  94% 98%   Weight: 69 kg     Height:        No intake or output data in the 24 hours ending 09/08/23 1149    PHYSICAL EXAM General: Well appearing, well-nourished, no acute distress. Sitting upright in bedside chair.  HEENT: Normocephalic and atraumatic. Neck: No JVD.  Lungs: Diminished bilaterally. Normal effort on 5L [place]. Heart: HRRR. Normal S1 and S2 without gallops or murmurs. Radial & DP pulses 2+ bilaterally. Abdomen: Non-distended appearing.  Msk: Normal strength and tone for age. Extremities: No clubbing, cyanosis or edema.     LABS: Basic Metabolic Panel: Recent Labs    09/06/23 0502 09/07/23 0609 09/08/23 0746  NA 144 143 139  K 3.6 3.5 3.6  CL 114* 114* 112*  CO2 22 20* 19*  GLUCOSE 113* 105* 112*  BUN 29* 26* 24*  CREATININE 1.36* 1.11 1.11  CALCIUM 8.0* 8.0* 8.2*  MG 2.4  2.2  --   PHOS 2.6 3.1  --    Liver Function Tests: Recent Labs    09/07/23 0609  AST 45*  ALT 53*  ALKPHOS 76  BILITOT 0.8  PROT 5.8*  ALBUMIN 2.6*    No results for input(s): "LIPASE", "AMYLASE" in the last 72 hours.  CBC: Recent Labs    09/07/23 0609 09/08/23 0746  WBC 15.6* 21.7*  NEUTROABS 11.9* 18.2*  HGB 9.7* 9.8*  HCT 29.8* 30.0*  MCV 86.1 85.5  PLT 200 295   Cardiac Enzymes: No results for input(s): "CKTOTAL", "CKMB", "CKMBINDEX", "TROPONINIHS" in the last 72 hours.  BNP: No results for input(s): "BNP" in the last 72 hours. D-Dimer: No results for input(s): "DDIMER" in the last 72 hours. Hemoglobin A1C: No results for input(s): "HGBA1C" in the last 72 hours.  Fasting Lipid Panel: No results for input(s): "CHOL", "HDL", "LDLCALC", "TRIG", "CHOLHDL", "LDLDIRECT" in the last 72 hours.  Thyroid Function Tests: No results for input(s): "TSH", "T4TOTAL", "T3FREE", "THYROIDAB" in the last 72 hours.  Invalid input(s): "FREET3" Anemia Panel: No results for input(s): "VITAMINB12", "FOLATE", "FERRITIN", "TIBC", "IRON", "RETICCTPCT" in the last 72 hours.  No results found.   ECHO as above  TELEMETRY reviewed by me 09/08/23: sinus rhythm rate 70s  EKG reviewed by me 09/08/23: sinus rhythm rate 80 bpm  DATA reviewed by me 09/08/23: last 24h vitals tele labs imaging I/O, hospitalist progress note, neurology note  Principal Problem:   Cardiac arrest [facility]) Active Problems:   STEMI (ST elevation myocardial infarction) (HCC)    ASSESSMENT AND PLAN: Mark Mack is a 67 y.o. male with a past medical history of CAD s/p CABG x 4 2019, hypertension, hyperlipidemia who was brought to the ED on 08/31/2023 for witnessed cardiac arrest. When ROSC was obtained there was concern for inferolateral STEMI and patient was taken emergently to the cath lab.   # Cardiac arrest # STEMI # Coronary artery disease s/p CABG x4 2019 Patient with witnessed cardiac arrest, ROSC  obtained and EKG concerning for inferolateral STEMI. Patient taken urgently to cath lab 11/3, unsuccessful attempts to cross ostial LCx and prox-mid RCA lesions. PCWP of 25 mmHg. Required multiple vasopressors, now weaned completed. Echo this admission demonstrated preserved EF. Has recovered well, no reoccurrence of CP.  -Continue Losartan and Toprol.  -Continue aspirin 81 mg daily, plavix 75 mg daily.  -Continue atorvastatin 41 mg daily.   # Acute Hypoxic and Hypercapnic Respiratory Failure # Pulmonary contusion # Bland alveolar hemorrhage Bronch done 11/3 demonstrated alveolar hemorrhage likely 2/2 pulmonary contusion. Extubated 09/02/23. -Management per PCCM.   #AKI on CKD #Metabolic acidosis Cr 1.24 on admission -Continue to monitor renal function closely.   Patient stable for discharge to inpatient rehab from cardiac perspective. Cardiology will sign off. Please haiku with questions or re-engage if needed.    Signed:  [name], PA-C  09/08/2023, 11:49 AM [facility] Cardiology

## 2023-09-08 NOTE — Discharge Summary (Signed)
Physician Discharge Summary   Patient: Mark Mack MRN: [id number] DOB: [birth year]  Admit date:     08/31/2023  Discharge date: 09/08/23  Discharge Physician: Jonah Blue   PCP: Elane Fritz, MD   Recommendations at discharge:   You are being discharged to acute inpatient rehabilitation at [facility]. Wean oxygen as tolerated Consider further evaluation with MRI and/or psychiatry consult if anoxic encephalopathy is not showing ongoing improvement. Recheck CBC and CMP later this week Follow up with cardiology in 1 week after discharge from CIR Follow up with cardiac rehabilitation after discharge from CIR STOP smoking! Do NOT use drugs!  Discharge Diagnoses: Principal Problem:   Cardiac arrest [facility]) Active Problems:   Coronary artery disease of native artery of native heart with stable angina pectoris (HCC)   Hypercholesterolemia   Primary hypertension   STEMI (ST elevation myocardial infarction) (HCC)   Acute metabolic encephalopathy   Acute respiratory failure with hypoxia and hypercapnia (HCC)   AKI (acute kidney injury) (HCC)   Chronic kidney disease, stage 3a (HCC)   Aspiration pneumonia (HCC)   Polysubstance abuse [facility]    Hospital Course: [phone] with h/o HTN, cocaine abuse, and CAD s/p CABG who presented on 11/3 with witnessed cardiac arrest at home.  Code STEMI was called and he was found to have 100% occlusion of the ostial circumflex and proximal RCA with unsuccessful intervention attempts.  He suffered prolonged downtime and was intubated with concern for poor overall prognosis, made DNR.  Extubated on 11/5, currently on HFNC O2.  No longer requiring vasopressors.  Transferred out of ICU.  Awaiting discharge to CIR.  Remains on HFNC O2.  Assessment and Plan:  Cardiac Arrest Patient with known h/o CAD s/p CABG - used cocaine PTA, had out of facility arrest and prolonged CPR, ROSC obtained Inferior STEMI with cardiogenic shock Unsuccessful attempts  to cross ostial LCx and prox-mid RCA during cath Required vasopressors, now off Troponin peaked at 16K 11/4 TTE: EF of 67% Cardiology has signed off Held asa, plavix given alveolar hemorrhage - restarted on 11/10 Held statin on admission given shock liver; statin started once this improved Continue losartan, Toprol XL (lower dose than at home) Code status changed back to full code per family on 11/8   Post-arrest Metabolic Encephalopathy -  improving 11/5 Repeat Head CT: no evidence of anoxic injury or hemorrhage 11/4 EEG: diffuse encephalopathy without evidence of seizures No longer on Keppra UDS + benzo, cocaine, cannibinoid Neuro following Speech, OT & PT following Trazodone prn for insomnia Continues with periodic mood lability, agitation, and short-term memory loss  He was planned for MRI but based on improvement at this time and the low likelihood that it will provide meaningful information to change his clinical course, will hold for now; family is aware that this can be ordered at any time if he regresses He experienced delirium overnight on 11/8, probably out of the window for DTs so started nightly Seroquel for hospital-associated delirium in conjunction with post-arrest encephalopathy Consider psychiatry consult if he is not showing ongoing improvement    Acute Hypoxic and Hypercapnic Respiratory Failure Patient experienced pulmonary contusion and alveolar hemorrhage resulting from CPR No longer requiring mechanical ventilation Weaned to HHFNC -> 6L [place] O2, appears likely to need O2 on an going basis and can wean as tolerated Continue bronchodilators as needed Completed course of antibiotics (Zosyn) Blood cultures NTD x 5 days Hgb stable Monitor for recurrent issues with resumption of DAPT on 11/10, but he  is likely out of the window for complications at this time   Pancreatitis/Transaminitis 11/5 ABD/Pelvic CT: persistent peripancreatic stranding, no evidence of  hemorrhage Eating well LFT elevation on presentation, likely shock liver from cardiac arrest; now improving and almost resolved   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 AKI due to cardiogenic shock Also with metabolic acidosis Renal function is back to baseline Avoid nephrotoxic agents and renal dose medications as needed   Acute Anemia/Thrombocytopenia Appears to be stable at this time   Aspiration Pneumonia Completed course of Zosyn for aspiration pneumonia SLP evaluation, recommending dysphagia 3 diet    Polysubstance abuse UDS positive for BZD, cocaine, THC Patient acknowledges periodic use at "parties" Strict cessation encouraged       Consultants: [facility]iology Neurology Palliative care [facility] team PT/OT SLP CIR   Procedures: CPR 11/3 Intubation 11/3 Art line 11/3 CVC 11/3 Bronchoscopy 11/3 EEG 11/4   Antibiotics: Unasyn 11/3 Zosyn 11/3-9   30 Day Unplanned Readmission Risk Score    Flowsheet Row ED to Hosp-Admission (Current) from 08/31/2023 in [facility]  30 Day Unplanned Readmission Risk Score (%) 17.27 Filed at 09/08/2023 0401       This score is the patient's risk of an unplanned readmission within 30 days of being discharged (0 -100%). The score is based on dignosis, age, lab data, medications, orders, and past utilization.   Low:  0-14.9   Medium: 15-21.9   High: 22-29.9   Extreme: 30 and above          Pain control - [place] Controlled Substance Reporting System database was reviewed. and patient was instructed, not to drive, operate heavy machinery, perform activities at heights, swimming or participation in water activities or provide baby-sitting services while on Pain, Sleep and Anxiety Medications; until their outpatient Physician has advised to do so again. Also recommended to not to take more than prescribed Pain, Sleep and Anxiety Medications.   Disposition: Rehabilitation facility Diet recommendation:   Dysphagia 3, baked/sweet potatoes ok, NO straws! DISCHARGE MEDICATION: Allergies as of 09/08/2023   No Known Allergies      Medication List     STOP taking these medications    Telmisartan-amLODIPine 80-10 MG Tabs       TAKE these medications    acetaminophen 500 MG tablet Commonly known as: TYLENOL Take 1,000 mg by mouth every 6 (six) hours as needed.   aspirin 81 MG chewable tablet Chew 1 tablet (81 mg total) by mouth daily. Start taking on: September 09, 2023   atorvastatin 40 MG tablet Commonly known as: LIPITOR Take 1 tablet (40 mg total) by mouth daily. Start taking on: September 09, 2023   clopidogrel 75 MG tablet Commonly known as: PLAVIX Take 1 tablet (75 mg total) by mouth daily. Start taking on: September 09, 2023   docusate sodium 100 MG capsule Commonly known as: COLACE Take 1 capsule (100 mg total) by mouth 2 (two) times daily as needed for mild constipation.   feeding supplement (KATE FARMS STANDARD 1.4) Liqd liquid Take 325 mLs by mouth 3 (three) times daily between meals.   folic acid 1 MG tablet Commonly known as: FOLVITE Take 1 tablet (1 mg total) by mouth daily. Start taking on: September 09, 2023   losartan 25 MG tablet Commonly known as: COZAAR Take 1 tablet (25 mg total) by mouth daily. Start taking on: September 09, 2023   metoprolol succinate 25 MG 24 hr tablet Commonly known as: TOPROL-XL Take  0.5 tablets (12.5 mg total) by mouth daily. Start taking on: September 09, 2023 What changed:  medication strength how much to take   multivitamin with minerals Tabs tablet Take 1 tablet by mouth daily. Start taking on: September 09, 2023   pantoprazole 40 MG tablet Commonly known as: PROTONIX Take 1 tablet (40 mg total) by mouth at bedtime.   polyethylene glycol 17 g packet Commonly known as: MIRALAX / GLYCOLAX Take 17 g by mouth daily as needed for moderate constipation.   QUEtiapine 25 MG tablet Commonly known as: SEROQUEL Take  1 tablet (25 mg total) by mouth at bedtime.   thiamine 100 MG tablet Commonly known as: Vitamin B-1 Take 1 tablet (100 mg total) by mouth daily. Start taking on: September 09, 2023   traZODone 50 MG tablet Commonly known as: DESYREL Take 1 tablet (50 mg total) by mouth at bedtime.        Follow-up Information     Alwyn Pea, MD. Go in 1 week(s).   Specialties: Cardiology, Internal Medicine Contact information: [address] [place] [address] [phone]                Discharge Exam:   Subjective: Had a better night last night, calm this AM, eager to go to CIR.  Still some SOB with activity.   Objective: Vitals:   09/08/23 0814 09/08/23 0933  BP: 127/77 127/77  Pulse: 86 86  Resp: 20   Temp: 98.7 F (37.1 C)   SpO2: 98%    No intake or output data in the 24 hours ending 09/08/23 1211 Filed Weights   09/05/23 0500 09/07/23 0803 09/08/23 0500  Weight: 68.7 kg 69.9 kg 69 kg    Exam:  General:  Appears calm and comfortable and is in NAD, on  O2 Eyes:  EOMI, normal lids, iris ENT:  grossly normal hearing, lips & tongue, mmm Neck:  no LAD, masses or thyromegaly Cardiovascular:  RRR, no m/r/g. No LE edema.  Respiratory:   CTA bilaterally with no wheezes/rales/rhonchi.  Normal respiratory effort. Abdomen:  soft, NT, ND Skin:  no rash or induration seen on limited exam Musculoskeletal:  grossly normal tone BUE/BLE, good ROM, no bony abnormality Psychiatric:  mildly anxious mood and affect, speech fluent and appropriate, AOx3 Neurologic:  CN 2-12 grossly intact, moves all extremities in coordinated fashion  Data Reviewed: I have reviewed the patient's lab results since admission.  Pertinent labs for today include:   CO2 19 Glucose 112 BUN 24/Creatinine 1.11/GFR >60 WBC 21.7 Hgb 9.8    Condition at discharge: improving  The results of significant diagnostics from this hospitalization (including imaging, microbiology, ancillary  and laboratory) are listed below for reference.   Imaging Studies: CARDIAC CATHETERIZATION  Result Date: 09/05/2023   Prox RCA lesion is 100% stenosed.   Ost Cx to Prox Cx lesion is 100% stenosed.   Ost LAD lesion is 75% stenosed.   Prox LAD-1 lesion is 50% stenosed.   Prox LAD-2 lesion is 75% stenosed.   Mid LAD lesion is 50% stenosed.   Dist LAD lesion is 75% stenosed.   Dist RCA lesion is 100% stenosed.   Prox Cx lesion is 100% stenosed.   Origin lesion is 100% stenosed.   There is mild left ventricular systolic dysfunction.   The left ventricular ejection fraction is 45-50% by visual estimate.   Anticipated discharge date to be determined.   Recommend uninterrupted dual antiplatelet therapy with Aspirin 81mg  daily and Clopidogrel  75mg  daily for a minimum of 12 months (ACS-Class I recommendation). Conclusion STEMI presentation from the field witnessed arrest.  Patient was brought to cardiac Cath Lab for right and left heart cath possible PCI and stent EKG had diffuse elevation inferior laterally borderline for STEMI. Left ventriculogram Mild reduced left ventricular function anterior apical hypokinesis EF around 45% Coronaries Left main mild to moderate disease 50% ostial TIMI-3 flow large LAD large 75% ostial 50% proximal 75% mid grafted LIMA diffuse disease mid to distal 50 to 75% TIMI-3 flow Circumflex large ostial 100% occluded probable CTO TIMI 0 flow RCA 100 send occluded proximally probable CTO TIMI 0 flow Grafts LIMA to mid LAD  patent but atretic with competitive flow SVG to circumflex OM system widely patent SVG to diagonal 100% occluded at the origin TIMI 0 flow SVG to distal RCA widely patent Intervention Attempted intervention of ostial circumflex unsuccessful unable to cross with a wire CTO Attempt intervention to proximal RCA unable to cross the wire probable CTO Right heart cath with witness arrest intubated sedated Mean wedge 25 Mean PA 31 AO sat 76% Catheters and sheath removed Patient  transferred to ICU for critical care management This was unlikely a STEMI   DG Chest Port 1 View  Result Date: 09/02/2023 CLINICAL DATA:  Short of breath EXAM: PORTABLE CHEST 1 VIEW COMPARISON:  08/31/2023 FINDINGS: Single frontal view of the chest demonstrates right internal jugular catheter tip overlying superior vena cava. Cardiac silhouette is stable. There are persistent but improving bilateral ground-glass opacities throughout the lungs. No effusion or pneumothorax. No acute bony abnormalities. IMPRESSION: 1. Persistent but improving bilateral airspace disease, consistent with improving pneumonia or edema. Electronically Signed   By: Sharlet Salina M.D.   On: 09/02/2023 23:50   CT ABDOMEN PELVIS WO CONTRAST  Result Date: 09/02/2023 CLINICAL DATA:  Acute pancreatitis EXAM: CT CHEST, ABDOMEN AND PELVIS WITHOUT CONTRAST TECHNIQUE: Multidetector CT imaging of the chest, abdomen and pelvis was performed following the standard protocol without IV contrast. RADIATION DOSE REDUCTION: This exam was performed according to the departmental dose-optimization program which includes automated exposure control, adjustment of the mA and/or kV according to patient size and/or use of iterative reconstruction technique. COMPARISON:  CTA runoff dated April 03, 2018 FINDINGS: CT CHEST FINDINGS Cardiovascular: Mild cardiomegaly. Small amount of air seen in the right ventricle, likely injection related. 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 Normal caliber thoracic aorta with moderate calcified plaque. Central venous line with tip in the right atrium. Mediastinum/Nodes: Esophagus and thyroid unremarkable. No enlarged lymph nodes seen in the chest. Lungs/Pleura: Central airways are patent. Slightly decreased lower lobe predominant consolidations and upper lobe predominant ground-glass opacities. No pleural effusion or pneumothorax. Musculoskeletal: Prior median sternotomy. No aggressive appearing osseous  lesions. CT ABDOMEN PELVIS FINDINGS Hepatobiliary: No focal liver abnormality is seen. High attenuation material seen in the gallbladder, likely vicarious excretion of contrast. Possible mild gallbladder wall thickening. No evidence of biliary ductal dilation. Pancreas: Persistent peripancreatic stranding which is most prominent about the pancreatic head with no areas of hyperattenuation. Stranding tracks along the right-greater-than-left pericolic gutters. Spleen: Normal in size without focal abnormality. Adrenals/Urinary Tract: Bilateral adrenal glands are unremarkable. Persistent enhancement of the bilateral kidneys. Mild bladder wall thickening. Stomach/Bowel: Stomach is within normal limits. No evidence of wall thickening or obstruction. Vascular/Lymphatic: Aortic atherosclerosis. No enlarged abdominal or pelvic lymph nodes. Reproductive: Prostate is not well visualized due to streak artifact. Other: No intra-abdominal free air.  Trace abdominopelvic ascites. Musculoskeletal: Prior  bilateral total hip arthroplasties. Lytic and sclerotic lesion of L4, unchanged when compared with 2019 prior. IMPRESSION: 1. Persistent peripancreatic stranding which is most prominent about the pancreatic head, consistent with acute pancreatitis. No evidence of hemorrhage on today's exam. Previously described hyperattenuation not present on today's exam and was likely a perfusional abnormality secondary to hypoperfusion injury. 2. Slightly decreased lower lobe predominant consolidations and upper lobe predominant ground-glass opacities, likely improving aspiration pneumonia. 3. Persistent enhancement of the bilateral kidneys, findings can be seen in the setting of acute tubular necrosis. 4. Trace abdominopelvic ascites. 5. Aortic Atherosclerosis (ICD10-I70.0). Electronically Signed   By: Allegra Lai M.D.   On: 09/02/2023 12:58   CT CHEST WO CONTRAST  Result Date: 09/02/2023 CLINICAL DATA:  Acute pancreatitis EXAM: CT  CHEST, ABDOMEN AND PELVIS WITHOUT CONTRAST TECHNIQUE: Multidetector CT imaging of the chest, abdomen and pelvis was performed following the standard protocol without IV contrast. RADIATION DOSE REDUCTION: This exam was performed according to the departmental dose-optimization program which includes automated exposure control, adjustment of the mA and/or kV according to patient size and/or use of iterative reconstruction technique. COMPARISON:  CTA runoff dated April 03, 2018 FINDINGS: CT CHEST FINDINGS Cardiovascular: Mild cardiomegaly. Small amount of air seen in the right ventricle, likely injection related. 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 Normal caliber thoracic aorta with moderate calcified plaque. Central venous line with tip in the right atrium. Mediastinum/Nodes: Esophagus and thyroid unremarkable. No enlarged lymph nodes seen in the chest. Lungs/Pleura: Central airways are patent. Slightly decreased lower lobe predominant consolidations and upper lobe predominant ground-glass opacities. No pleural effusion or pneumothorax. Musculoskeletal: Prior median sternotomy. No aggressive appearing osseous lesions. CT ABDOMEN PELVIS FINDINGS Hepatobiliary: No focal liver abnormality is seen. High attenuation material seen in the gallbladder, likely vicarious excretion of contrast. Possible mild gallbladder wall thickening. No evidence of biliary ductal dilation. Pancreas: Persistent peripancreatic stranding which is most prominent about the pancreatic head with no areas of hyperattenuation. Stranding tracks along the right-greater-than-left pericolic gutters. Spleen: Normal in size without focal abnormality. Adrenals/Urinary Tract: Bilateral adrenal glands are unremarkable. Persistent enhancement of the bilateral kidneys. Mild bladder wall thickening. Stomach/Bowel: Stomach is within normal limits. No evidence of wall thickening or obstruction. Vascular/Lymphatic: Aortic atherosclerosis. No  enlarged abdominal or pelvic lymph nodes. Reproductive: Prostate is not well visualized due to streak artifact. Other: No intra-abdominal free air.  Trace abdominopelvic ascites. Musculoskeletal: Prior bilateral total hip arthroplasties. Lytic and sclerotic lesion of L4, unchanged when compared with 2019 prior. IMPRESSION: 1. Persistent peripancreatic stranding which is most prominent about the pancreatic head, consistent with acute pancreatitis. No evidence of hemorrhage on today's exam. Previously described hyperattenuation not present on today's exam and was likely a perfusional abnormality secondary to hypoperfusion injury. 2. Slightly decreased lower lobe predominant consolidations and upper lobe predominant ground-glass opacities, likely improving aspiration pneumonia. 3. Persistent enhancement of the bilateral kidneys, findings can be seen in the setting of acute tubular necrosis. 4. Trace abdominopelvic ascites. 5. Aortic Atherosclerosis (ICD10-I70.0). Electronically Signed   By: Allegra Lai M.D.   On: 09/02/2023 12:58   CT HEAD WO CONTRAST ( )  Result Date: 09/02/2023 CLINICAL DATA:  67 year old male neurologic deficit, altered mental status, cardiac arrest, CPR, intubated. EXAM: CT HEAD WITHOUT CONTRAST TECHNIQUE: Contiguous axial images were obtained from the base of the skull through the vertex without intravenous contrast. RADIATION DOSE REDUCTION: This exam was performed according to the departmental dose-optimization program which includes automated exposure control, adjustment of the mA and/or  kV according to patient size and/or use of iterative reconstruction technique. COMPARISON:  Head CT 08/31/2023. FINDINGS: Brain: Stable cerebral volume. No midline shift, mass effect, or evidence of intracranial mass lesion. No ventriculomegaly. Basilar cisterns remain patent, stable. Cerebral sulci remain visible. Gray-white differentiation is visible in both hemispheres. Patchy and confluent white  matter hypodensity bilaterally, periatrial regions most affected. No cortically based acute infarct identified. Vascular: Resolved intravascular contrast. No suspicious intracranial vascular hyperdensity. Calcified atherosclerosis at the skull base. Skull: Stable, intact. Sinuses/Orbits: Intubated on the scout view. Mild sphenoid sinus mucosal thickening and bubbly opacity. Other visualized paranasal sinuses and mastoids are stable and well aerated. Other: Visualized orbits and scalp soft tissues are within normal limits. IMPRESSION: No acute intracranial abnormality, no CT evidence of anoxic injury. Advanced cerebral white matter changes. Electronically Signed   By: Odessa Fleming M.D.   On: 09/02/2023 12:46   DG Abd Portable 1V  Result Date: 09/02/2023 CLINICAL DATA:  Feeding tube placement. EXAM: PORTABLE ABDOMEN - 1 VIEW COMPARISON:  September 01, 2023. FINDINGS: Distal tip of nasogastric tube is seen in expected position of distal stomach. No abnormal bowel dilatation is noted. Contrast is noted in nondilated colon. IMPRESSION: Distal tip of nasogastric tube seen in expected position of distal stomach. Electronically Signed   By: Lupita Raider M.D.   On: 09/02/2023 08:32   ECHOCARDIOGRAM COMPLETE  Result Date: 09/01/2023    ECHOCARDIOGRAM REPORT   Patient Name:   Mark Mack Date of Exam: 09/01/2023 Medical Rec #:  [id number]   Height:       66.0 in Accession #:    [id number]  Weight:       159.2 lb Date of Birth:  [birth year]    BSA:          1.815 m Patient Age:    67 years    BP:           118/72 mmHg Patient Gender: M           HR:           66 bpm. Exam Location:  [facility] Procedure: 2D Echo, Cardiac Doppler and Color Doppler Indications:     Cardiac Arrest  History:         Patient has no prior history of Echocardiogram examinations.                  CAD, Signs/Symptoms:Chest Pain; Risk Factors:Hypertension and                  Dyslipidemia. Cardiac arrest.  Sonographer:     Mikki Harbor Referring Phys:   [id number] DWAYNE D CALLWOOD Diagnosing Phys: Clotilde Dieter  Sonographer Comments: Echo performed with patient supine and on artificial respirator. IMPRESSIONS  1. Left ventricular ejection fraction, by estimation, is 60 to 65%. Left ventricular ejection fraction by PLAX is 67 %. The left ventricle has normal function. The left ventricle has no regional wall motion abnormalities. Left ventricular diastolic parameters are consistent with Grade I diastolic dysfunction (impaired relaxation).  2. Right ventricular systolic function is low normal. The right ventricular size is mildly enlarged. There is mildly elevated pulmonary artery systolic pressure. The estimated right ventricular systolic pressure is 38.4 mmHg.  3. Right atrial size was mildly dilated.  4. A small pericardial effusion is present. There is no evidence of cardiac tamponade.  5. The mitral valve is grossly normal. Trivial mitral valve regurgitation.  6. The aortic valve is grossly normal. Aortic valve regurgitation  is not visualized. No aortic stenosis is present. FINDINGS  Left Ventricle: Left ventricular ejection fraction, by estimation, is 60 to 65%. Left ventricular ejection fraction by PLAX is 67 %. The left ventricle has normal function. The left ventricle has no regional wall motion abnormalities. The left ventricular internal cavity size was normal in size. There is no left ventricular hypertrophy. Left ventricular diastolic parameters are consistent with Grade I diastolic dysfunction (impaired relaxation). Right Ventricle: The right ventricular size is mildly enlarged. No increase in right ventricular wall thickness. Right ventricular systolic function is low normal. There is mildly elevated pulmonary artery systolic pressure. The tricuspid regurgitant velocity is 2.42 m/s, and with an assumed right atrial pressure of 15 mmHg, the estimated right ventricular systolic pressure is 38.4 mmHg. Left Atrium: Left atrial size was normal in size. Right  Atrium: Right atrial size was mildly dilated. Pericardium: A small pericardial effusion is present. There is no evidence of cardiac tamponade. Mitral Valve: The mitral valve is grossly normal. Trivial mitral valve regurgitation. MV peak gradient, 2.0 mmHg. The mean mitral valve gradient is 1.0 mmHg. Tricuspid Valve: The tricuspid valve is grossly normal. Tricuspid valve regurgitation is trivial. Aortic Valve: The aortic valve is grossly normal. Aortic valve regurgitation is not visualized. No aortic stenosis is present. Aortic valve mean gradient measures 5.0 mmHg. Aortic valve peak gradient measures 8.9 mmHg. Aortic valve area, by VTI measures 2.59 cm. Pulmonic Valve: The pulmonic valve was grossly normal. Pulmonic valve regurgitation is mild to moderate. Aorta: The aortic root is normal in size and structure. IAS/Shunts: No atrial level shunt detected by color flow Doppler.  LEFT VENTRICLE PLAX 2D LV EF:         Left            Diastology                ventricular     LV e' medial:    8.05 cm/s                ejection        LV E/e' medial:  7.7                fraction by     LV e' lateral:   7.29 cm/s                PLAX is 67      LV E/e' lateral: 8.5                %. LVIDd:         3.60 cm LVIDs:         2.30 cm LV PW:         1.20 cm LV IVS:        1.30 cm LVOT diam:     2.00 cm LV SV:         63 LV SV Index:   35 LVOT Area:     3.14 cm  RIGHT VENTRICLE RV Basal diam:  4.20 cm RV Mid diam:    3.30 cm RV S prime:     5.44 cm/s LEFT ATRIUM             Index        RIGHT ATRIUM           Index LA diam:        2.90 cm 1.60 cm/m   RA Area:     19.40 cm LA Vol (A2C):  31.0 ml 17.08 ml/m  RA Volume:   59.30 ml  32.67 ml/m LA Vol (A4C):   19.1 ml 10.52 ml/m LA Biplane Vol: 24.9 ml 13.72 ml/m  AORTIC VALVE                     PULMONIC VALVE AV Area (Vmax):    2.72 cm      PV Vmax:       0.91 m/s AV Area (Vmean):   2.52 cm      PV Peak grad:  3.3 mmHg AV Area (VTI):     2.59 cm AV Vmax:           149.00  cm/s AV Vmean:          102.000 cm/s AV VTI:            0.244 m AV Peak Grad:      8.9 mmHg AV Mean Grad:      5.0 mmHg LVOT Vmax:         129.00 cm/s LVOT Vmean:        81.900 cm/s LVOT VTI:          0.201 m LVOT/AV VTI ratio: 0.82  AORTA Ao Root diam: 3.40 cm MITRAL VALVE               TRICUSPID VALVE MV Area (PHT): 3.01 cm    TR Peak grad:   23.4 mmHg MV Area VTI:   2.90 cm    TR Vmax:        242.00 cm/s MV Peak grad:  2.0 mmHg MV Mean grad:  1.0 mmHg    SHUNTS MV Vmax:       0.71 m/s    Systemic VTI:  0.20 m MV Vmean:      34.2 cm/s   Systemic Diam: 2.00 cm MV Decel Time: 252 msec MV E velocity: 62.10 cm/s MV A velocity: 47.60 cm/s MV E/A ratio:  1.30 Designer, multimedia signed by Clotilde Dieter Signature Date/Time: 09/01/2023/2:02:39 PM    Final    EEG adult  Result Date: 09/01/2023 Charlsie Quest, MD     09/01/2023 12:29 PM Patient Name: Mark Mack MRN: [id number] Epilepsy Attending: Charlsie Quest Referring Physician/Provider: Milon Dikes, MD Date: 09/01/2023 Duration: 31.32 mins Patient history: 67 year old male status post cardiac arrest.  EEG to alert for seizure. Level of alertness: comatose AEDs during EEG study: Propofol Technical aspects: This EEG study was done with scalp electrodes positioned according to the 10-20 International system of electrode placement. Electrical activity was reviewed with band pass filter of 1-70Hz , sensitivity of 7 uV/mm, display speed of 28mm/sec with a 60Hz  notched filter applied as appropriate. EEG data were recorded continuously and digitally stored.  Video monitoring was available and reviewed as appropriate. Description: EEG showed continuous generalized polymorphic mixed frequencies with predominantly 5 to 9 Hz theta-alpha activity admixed with intermittent generalized 2 to 3 Hz delta slowing.  Hyperventilation and photic stimulation were not performed.   ABNORMALITY - Continuous slow, generalized IMPRESSION: This study is suggestive of moderate  diffuse encephalopathy. No seizures or epileptiform discharges were seen throughout the recording. Charlsie Quest   DG Abd 1 View  Result Date: 09/01/2023 CLINICAL DATA:  OG tube placement EXAM: ABDOMEN - 1 VIEW COMPARISON:  None Available. FINDINGS: Enteric tube terminates in the proximal duodenum. IMPRESSION: Enteric tube terminates in the proximal duodenum. Electronically Signed   By: Charline Bills M.D.   On: 09/01/2023 01:26  DG Chest Port 1 View  Result Date: 08/31/2023 CLINICAL DATA:  Central line placement EXAM: PORTABLE CHEST 1 VIEW COMPARISON:  Same day CT FINDINGS: Interval placement of right internal jugular approach central venous catheter with distal tip terminating near the superior cavoatrial junction. Endotracheal tube has been retracted and now terminates 4.6 cm above the carina. Stable heart size. Aortic atherosclerosis. Diffuse bilateral airspace consolidations. No pleural effusion or pneumothorax. IMPRESSION: 1. Interval placement of right internal jugular approach central venous catheter with distal tip terminating near the superior cavoatrial junction. No pneumothorax. 2. Endotracheal tube has been retracted and now terminates 4.6 cm above the carina. 3. Persistent diffuse bilateral airspace consolidations. Electronically Signed   By: Duanne Guess D.O.   On: 08/31/2023 17:05   CT HEAD WO CONTRAST ( )  Addendum Date: 08/31/2023   ADDENDUM REPORT: 08/31/2023 15:33 ADDENDUM: The intravenous contrast present on the CT head is actually from prior cardiac catheterization, and not from the concurrently obtained CTA chest as initially reported. Electronically Signed   By: Lesia Hausen M.D.   On: 08/31/2023 15:33   Result Date: 08/31/2023 CLINICAL DATA:  Altered mental status EXAM: CT HEAD WITHOUT CONTRAST TECHNIQUE: Contiguous axial images were obtained from the base of the skull through the vertex without intravenous contrast. RADIATION DOSE REDUCTION: This exam was  performed according to the departmental dose-optimization program which includes automated exposure control, adjustment of the mA and/or kV according to patient size and/or use of iterative reconstruction technique. COMPARISON:  None Available. FINDINGS: The study is postcontrast due to prior contrast administration from a concurrently obtained CTA chest. This reduces sensitivity for subarachnoid hemorrhage. Brain: There is no definite acute intracranial hemorrhage or extra-axial fluid collection. There is no acute territorial infarct. Parenchymal volume is normal. The ventricles are normal in size. Gray-white differentiation is preserved. Hypodensity in the supratentorial white matter likely reflects sequela of underlying chronic small-vessel ischemic change. The pituitary and suprasellar region are normal there is no mass lesion or abnormal enhancement. There is no mass effect or midline shift. Vascular: The major vessels enhance normally. Skull: Normal. Negative for fracture or focal lesion. Sinuses/Orbits: There is complete opacification of the right sphenoid sinus. The globes and orbits are unremarkable. Other: The mastoid air cells and middle ear cavities are clear. IMPRESSION: The study is obtained postcontrast due to contrast administration from the concurrently obtained CTA chest. This reduces sensitivity for detection of subarachnoid hemorrhage. Within this confine, no evidence of acute intracranial pathology. Electronically Signed: By: Lesia Hausen M.D. On: 08/31/2023 13:26   CT Angio Chest Pulmonary Embolism (PE) W or WO Contrast  Result Date: 08/31/2023 CLINICAL DATA:  Witnessed cardiac arrest EXAM: CT ANGIOGRAPHY CHEST WITH CONTRAST TECHNIQUE: Multidetector CT imaging of the chest was performed using the standard protocol during bolus administration of intravenous contrast. Multiplanar CT image reconstructions and MIPs were obtained to evaluate the vascular anatomy. RADIATION DOSE REDUCTION: This  exam was performed according to the departmental dose-optimization program which includes automated exposure control, adjustment of the mA and/or kV according to patient size and/or use of iterative reconstruction technique. CONTRAST:  75mL OMNIPAQUE IOHEXOL 350 MG/ML SOLN COMPARISON:  None Available. FINDINGS: Cardiovascular: No evidence of pulmonary embolus. Normal heart size. No pericardial effusion. Normal caliber thoracic aorta with moderate atherosclerotic disease. 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 Mediastinum/Nodes: Esophagus and thyroid are unremarkable. No enlarged lymph nodes seen in the chest. Lungs/Pleura: ETT is in the distal trachea. Consolidations of the bilateral lower lobes. Central and upper  lung predominant patchy ground-glass opacities. No pneumothorax. Upper Abdomen: High attenuation stranding adjacent to the partially visualized pancreas, concerning for hemorrhagic pancreatitis. Musculoskeletal: Prior median sternotomy with intact sternal wires. Nondisplaced fractures of the anterior right fourth through sixth ribs. Review of the MIP images confirms the above findings. IMPRESSION: 1. No evidence of pulmonary embolus. 2. Consolidations of the bilateral lower lobes and central and upper lobe predominant ground-glass opacities, likely due to a combination of aspiration and pulmonary edema. 3. High attenuation stranding adjacent to the partially visualized pancreas, concerning for hemorrhagic pancreatitis. Correlate with lipase and consider dedicated abdomen and pelvis CT. 4. Nondisplaced fractures of the anterior right fourth through sixth ribs. 5. 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 6. Aortic Atherosclerosis (ICD10-I70.0). Electronically Signed   By: Allegra Lai M.D.   On: 08/31/2023 12:39    Microbiology: Results for orders placed or performed during the hospital encounter of 08/31/23  MRSA Next Gen by PCR, Nasal     Status: None   Collection  Time: 08/31/23 12:15 PM   Specimen: Nasal Mucosa; Nasal Swab  Result Value Ref Range Status   MRSA by PCR Next Gen NOT DETECTED NOT DETECTED Final    Comment: (NOTE) The GeneXpert MRSA Assay (FDA approved for NASAL specimens only), is one component of a comprehensive MRSA colonization surveillance program. It is not intended to diagnose MRSA infection nor to guide or monitor treatment for MRSA infections. Test performance is not FDA approved in patients less than 74 years old. Performed at [facility], [address] [place], [place] [address]   Culture, blood (Routine X 2) w Reflex to ID Panel     Status: None   Collection Time: 08/31/23  5:48 PM   Specimen: BLOOD  Result Value Ref Range Status   Specimen Description BLOOD CENTRAL LINE  Final   Special Requests   Final    BOTTLES DRAWN AEROBIC AND ANAEROBIC Blood Culture adequate volume   Culture   Final    NO GROWTH 5 DAYS Performed at [facility], [address] [place], [place] [address]    Report Status 09/05/2023 FINAL  Final  Culture, blood (Routine X 2) w Reflex to ID Panel     Status: None   Collection Time: 08/31/23  6:21 PM   Specimen: BLOOD  Result Value Ref Range Status   Specimen Description BLOOD BLOOD LEFT HAND  Final   Special Requests   Final    BOTTLES DRAWN AEROBIC AND ANAEROBIC Blood Culture results may not be optimal due to an inadequate volume of blood received in culture bottles   Culture   Final    NO GROWTH 5 DAYS Performed at [facility], [address] [place], [place] [address]    Report Status 09/05/2023 FINAL  Final    Labs: CBC: Recent Labs  Lab 09/03/23 0425 09/04/23 0304 09/05/23 0920 09/07/23 0609 09/08/23 0746  WBC 19.9* 20.7* 16.8* 15.6* 21.7*  NEUTROABS  --   --  13.0* 11.9* 18.2*  HGB 10.0* 10.1* 11.2* 9.7* 9.8*  HCT 30.4* 30.0* 34.2* 29.8* 30.0*  MCV 84.0 82.6 86.1 86.1 85.5  PLT 90* 104* 115* 200 295   Basic Metabolic Panel: Recent  Labs  Lab 09/03/23 0425 09/04/23 0304 09/05/23 0920 09/06/23 0502 09/07/23 0609 09/08/23 0746  NA 140 144 148* 144 143 139  K 3.3* 3.0* 3.4* 3.6 3.5 3.6  CL 104 111 115* 114* 114* 112*  CO2 27 28 25 22  20* 19*  GLUCOSE 96 95 105* 113* 105* 112*  BUN 36* 38* 31* 29* 26* 24*  CREATININE 1.94* 1.95* 1.57* 1.36* 1.11 1.11  CALCIUM 7.1* 7.6* 8.0* 8.0* 8.0* 8.2*  MG 2.6* 2.9* 2.8* 2.4 2.2  --   PHOS 4.4 3.1 2.5 2.6 3.1  --    Liver Function Tests: Recent Labs  Lab 09/05/23 0914 09/07/23 0609  AST 103* 45*  ALT 75* 53*  ALKPHOS 84 76  BILITOT 1.3* 0.8  PROT 6.3* 5.8*  ALBUMIN 2.9* 2.6*   CBG: Recent Labs  Lab 09/07/23 1535 09/07/23 2104 09/08/23 0012 09/08/23 0551 09/08/23 0902  GLUCAP 92 113* 150* 92 69*    Discharge time spent: greater than 30 minutes.  Signed: Jonah Blue, MD [facility] 09/08/2023

## 2023-09-08 NOTE — Plan of Care (Signed)

## 2023-09-08 NOTE — TOC Progression Note (Signed)
Transition of Care [facility]) - Progression Note    Patient Details  Name: Mark Mack MRN: [id number] Date of Birth: 10/04/1956  [facility] CM/SW Contact  Darolyn Rua, [place] Phone Number: 09/08/2023, 10:01 AM  Clinical Narrative:     CIR started insurance auth [id number], pending auth approval by Monia Pouch for admit to [facility].         Expected Discharge Plan and Services                                               Social Determinants of Health (SDOH) Interventions SDOH Screenings   Food Insecurity: No Food Insecurity (09/01/2023)  Housing: Low Risk  (09/01/2023)  Transportation Needs: No Transportation Needs (09/01/2023)  Utilities: Not At Risk (09/01/2023)  Financial Resource Strain: Low Risk  (04/15/2018)   Received from [facility]  Physical Activity: Sufficiently Active (04/15/2018)   Received from [facility]  Social Connections: Unknown (11/02/2019)   Received from [facility]  Tobacco Use: Medium Risk (09/05/2023)    Readmission Risk Interventions     No data to display

## 2023-09-08 NOTE — H&P (Signed)
Physical Medicine and Rehabilitation Admission H&P    Chief Complaint  Patient presents with   Functional deficits due to ABI    HPI:  Mark Mack is a 67 year old male with history of CAD s/p CABG 2019, HTN, ETOH abuse, tobacco use who was admitted to [facility] on 08/31/23 with witnessed cardiac arrest with multiple rounds of CPR with ROSC.  UDS positive for THC, cocaine and benzo. He was found to have inferior STEMI and taken to cath lab for [facility] showing occulusion in ostial circumflex and proximal and mid right coronaries which were unable to be crossed per Dr. Juliann Pares. He was started on IV heparin but developed alveolar hemorrhage requiring bronch with therapeutic aspiration of blood from right and left main bronchi felt to be due to lung contusion from CPR/Pulmonary edema and required bivalirudin as well as stress dose steroids. CTA chest was negative for PE but showed consolidation of BLL, central and upper lung secondary to aspiration and pulmonary edema as well as concerns of hemorrhagic pancreatitis. [name] He was started on IV Zosyn, milrionine and required multiple pressors. Plavix/heparin held due to bleed and abnormal LFTs due to shocked liver being monitored.   EEG done due to comatose state and showed moderate diffuse encephalopathy. He tolerated extubation  to [facility] 11/06 and post arrest encephalopathy improving therefore no need for MRI brain per Dr. Amada Jupiter. 2 D echo showed EF 60-65% with mild to moderate PVR. CT chest/abdomen/pelvis done 11/05 showed persistent peripancreatic stranding  c/w acute pancreatitis, improvement in aspiration and signs of acute tubular necrosis.  Swallow evaluation showed concerns of aspiration and increased WOB with intake therefore diet slowly initiated to D3, thins and no straws due to aspiration risk.  Electrolyte abnormalities with AKI resolving and hypokalemia supplemented intermittently. He was trated with CIWA protocol and delirium has resolved.      Review of Systems  Constitutional:  Negative for fever.  HENT:  Negative for hearing loss.   Respiratory:  Positive for cough. Negative for shortness of breath.        Reports blood is sputum is getting better.   Cardiovascular:  Positive for chest pain. Negative for palpitations.  Gastrointestinal:  Negative for constipation and heartburn.  Genitourinary:  Negative for dysuria.  Musculoskeletal:  Negative for joint pain and myalgias.  Skin:  Negative for rash.  Neurological:  Positive for weakness.  Psychiatric/Behavioral:  The patient does not have insomnia.     Past Medical History:  Diagnosis Date   CAD (coronary artery disease)    Hypertension    OA (osteoarthritis) of hip    bilateral hips   Tobacco use     Past Surgical History:  Procedure Laterality Date   CARDIAC SURGERY     CORONARY/GRAFT ACUTE MI REVASCULARIZATION N/A 08/31/2023   Procedure: Coronary/Graft Acute MI Revascularization;  Surgeon: Alwyn Pea, MD;  Location: [facility] INVASIVE CV LAB;  Service: Cardiovascular;  Laterality: N/A;   hip replacement Right    LEFT HEART CATH AND CORONARY ANGIOGRAPHY N/A 08/31/2023   Procedure: LEFT HEART CATH AND CORONARY ANGIOGRAPHY;  Surgeon: Alwyn Pea, MD;  Location: [facility] INVASIVE CV LAB;  Service: Cardiovascular;  Laterality: N/A;   TOTAL HIP ARTHROPLASTY Left 2021    No family history on file.   Social History:  Married. Retired a year ago--walks/golfs once a week. He  reports that he smokes about 5 cigarettes per day. He has never used smokeless tobacco. He drinks 6 beers/day.  He was  positive for cocaine and marijuana--only when he parties.    Allergies: No Known Allergies   Medications Prior to Admission  Medication Sig Dispense Refill   acetaminophen (TYLENOL) 500 MG tablet Take 1,000 mg by mouth every 6 (six) hours as needed.      [START ON 09/09/2023] aspirin 81 MG chewable tablet Chew 1 tablet (81 mg total) by mouth daily.     [START ON  09/09/2023] atorvastatin (LIPITOR) 40 MG tablet Take 1 tablet (40 mg total) by mouth daily.     [START ON 09/09/2023] clopidogrel (PLAVIX) 75 MG tablet Take 1 tablet (75 mg total) by mouth daily.     docusate sodium (COLACE) 100 MG capsule Take 1 capsule (100 mg total) by mouth 2 (two) times daily as needed for mild constipation.     [START ON 09/09/2023] folic acid (FOLVITE) 1 MG tablet Take 1 tablet (1 mg total) by mouth daily.     [START ON 09/09/2023] losartan (COZAAR) 25 MG tablet Take 1 tablet (25 mg total) by mouth daily.     [START ON 09/09/2023] metoprolol succinate (TOPROL-XL) 25 MG 24 hr tablet Take 0.5 tablets (12.5 mg total) by mouth daily.     [START ON 09/09/2023] Multiple Vitamin (MULTIVITAMIN WITH MINERALS) TABS tablet Take 1 tablet by mouth daily.     Nutritional Supplements (FEEDING SUPPLEMENT, KATE FARMS STANDARD 1.4,) LIQD liquid Take 325 mLs by mouth 3 (three) times daily between meals.     pantoprazole (PROTONIX) 40 MG tablet Take 1 tablet (40 mg total) by mouth at bedtime.     polyethylene glycol (MIRALAX / GLYCOLAX) 17 g packet Take 17 g by mouth daily as needed for moderate constipation.     QUEtiapine (SEROQUEL) 25 MG tablet Take 1 tablet (25 mg total) by mouth at bedtime.     [START ON 09/09/2023] thiamine (VITAMIN B-1) 100 MG tablet Take 1 tablet (100 mg total) by mouth daily.     traZODone (DESYREL) 50 MG tablet Take 1 tablet (50 mg total) by mouth at bedtime.        Home: Home Living Family/patient expects to be discharged to:: Private residence Living Arrangements: Spouse/significant other Available Help at Discharge:  (wife works, dtr works from home and grandson can help) Type of Home: House Home Access: Stairs to enter Secretary/administrator of Steps: 2 Home Layout: One level Bathroom Shower/Tub: Engineer, manufacturing systems: Pharmacist, community: Yes  Lives With: Spouse   Functional History: Prior Function Prior Level of Function :  Independent/Modified Independent Mobility Comments: retired, independent and active (was working on a car recently) ADLs Comments: independent  Functional Status:  Mobility: Bed Mobility Overal bed mobility: Needs Assistance Bed Mobility: Sit to Supine Supine to sit: Min assist, Mod assist, HOB elevated, Used rails Sit to supine: Supervision General bed mobility comments: NT pt in recliner pre/post tx Transfers Overall transfer level: Needs assistance Equipment used: Rolling walker (2 wheels) Transfers: Sit to/from Stand Sit to Stand: Supervision General transfer comment: multiple transfers during session for hand placement education and carryover Ambulation/Gait Ambulation/Gait assistance: Contact guard assist, +2 safety/equipment Gait Distance (Feet): 30 Feet Assistive device: Rolling walker (2 wheels) Gait Pattern/deviations: Step-through pattern, Trunk flexed General Gait Details: deferred.  focused on functional mobility skills, balance and ex. Gait velocity: decreased Pre-gait activities: Mod A +2 person for weight shifting and marching in place. Standing activity tolerance limited for progression of ambulation    ADL: ADL Overall ADL's : Needs assistance/impaired Grooming: Oral care, Standing, Cueing  for sequencing, Cueing for safety Grooming Details (indicate cue type and reason): Sink level Lower Body Dressing: Maximal assistance, Bed level Lower Body Dressing Details (indicate cue type and reason): socks Toileting- Clothing Manipulation and Hygiene: Maximal assistance, Sit to/from stand, Cueing for sequencing Toileting - Clothing Manipulation Details (indicate cue type and reason): Pt aware of being soiled on this date Functional mobility during ADLs: Supervision/safety, Set up, Rolling walker (2 wheels), Cueing for sequencing, Cueing for safety General ADL Comments: No physical assistance required, step by step verbal cues for sequencing, cueing for proper RW use. Pt  ambulated to the bathroom and completed a toilet t/f with supervision+RW.  Cognition: Cognition Overall Cognitive Status: Within Functional Limits for tasks assessed Orientation Level: Oriented X4 Cognition Arousal: Alert Behavior During Therapy: WFL for tasks assessed/performed Overall Cognitive Status: Within Functional Limits for tasks assessed Area of Impairment: Orientation, Memory, Following commands, Safety/judgement, Problem solving Orientation Level: Situation, Disoriented to Memory: Decreased short-term memory, Decreased recall of precautions Following Commands: Follows multi-step commands inconsistently Safety/Judgement: Decreased awareness of safety, Decreased awareness of deficits Problem Solving: Slow processing, Decreased initiation, Difficulty sequencing, Requires verbal cues, Requires tactile cues General Comments: Pt highly motivated throughout session   Blood pressure 127/77, pulse 86, temperature 98.7 F (37.1 C), resp. rate 20, height 5\' 6"  (1.676 m), weight 69 kg, SpO2 98%. Physical Exam Vitals and nursing note reviewed.  Constitutional:      Appearance: Normal appearance.     Comments: Noted to be splinting at times due to mid sternal chest pain at site of burn from defibrillator?  HENT:     Head: Normocephalic.     Nose: Nose normal.  Eyes:     Extraocular Movements: Extraocular movements intact.     Conjunctiva/sclera: Conjunctivae normal.     Pupils: Pupils are equal, round, and reactive to light.  Cardiovascular:     Rate and Rhythm: Tachycardia present.     Heart sounds: No murmur heard.    No gallop.  Pulmonary:     Effort: Pulmonary effort is normal. No respiratory distress.     Breath sounds: Rales (at bases) present. No wheezing.     Comments: O2 6L via Burns Abdominal:     General: There is distension.     Tenderness: There is no abdominal tenderness.  Musculoskeletal:        General: No swelling, tenderness, deformity or signs of injury.      Cervical back: Normal range of motion.  Skin:    General: Skin is warm and dry.     Comments: Erythema right forearm around prior OS site. Granulation tissue on sternum  Neurological:     Mental Status: He is alert and oriented to person, place, and time.     Comments: Slow speech. Able to answer orientation questions and follow simple motor commands. Fair insight and awareness. No focal CN signs. Normal language and speech. MMT: UE grossly 4/5 prox til 4+/5 distally. LE: 4/5 prox to distal bilaterally. Good sitting balance. No sensory abnl appreciated. DTR's 1+  Psychiatric:     Comments: Pt flat but cooperative     Results for orders placed or performed during the hospital encounter of 08/31/23 (from the past 48 hour(s))  Glucose, capillary     Status: None   Collection Time: 09/06/23  4:35 PM  Result Value Ref Range   Glucose-Capillary 92 70 - 99 mg/dL    Comment: Glucose reference range applies only to samples taken after fasting for at least  8 hours.  Glucose, capillary     Status: None   Collection Time: 09/06/23  9:34 PM  Result Value Ref Range   Glucose-Capillary 89 70 - 99 mg/dL    Comment: Glucose reference range applies only to samples taken after fasting for at least 8 hours.  Glucose, capillary     Status: None   Collection Time: 09/07/23 12:27 AM  Result Value Ref Range   Glucose-Capillary 82 70 - 99 mg/dL    Comment: Glucose reference range applies only to samples taken after fasting for at least 8 hours.  Glucose, capillary     Status: None   Collection Time: 09/07/23  3:00 AM  Result Value Ref Range   Glucose-Capillary 85 70 - 99 mg/dL    Comment: Glucose reference range applies only to samples taken after fasting for at least 8 hours.  Phosphorus     Status: None   Collection Time: 09/07/23  6:09 AM  Result Value Ref Range   Phosphorus 3.1 2.5 - 4.6 mg/dL    Comment: Performed at [facility] [address] [place], [place] [address]  Magnesium      Status: None   Collection Time: 09/07/23  6:09 AM  Result Value Ref Range   Magnesium 2.2 1.7 - 2.4 mg/dL    Comment: Performed at [facility], [address] [place], [place] [address]  CBC with Differential/Platelet     Status: Abnormal   Collection Time: 09/07/23  6:09 AM  Result Value Ref Range   WBC 15.6 (H) 4.0 - 10.5 K/uL   RBC 3.46 (L) 4.22 - 5.81 MIL/uL   Hemoglobin 9.7 (L) 13.0 - 17.0 g/dL   HCT 09.8 (L) 11.9 - 14.7 %   MCV 86.1 80.0 - 100.0 fL   MCH 28.0 26.0 - 34.0 pg   MCHC 32.6 30.0 - 36.0 g/dL   RDW 82.9 56.2 - 13.0 %   Platelets 200 150 - 400 K/uL   nRBC 1.1 (H) 0.0 - 0.2 %   Neutrophils Relative % 76 %   Neutro Abs 11.9 (H) 1.7 - 7.7 K/uL   Lymphocytes Relative 11 %   Lymphs Abs 1.8 0.7 - 4.0 K/uL   Monocytes Relative 9 %   Monocytes Absolute 1.3 (H) 0.1 - 1.0 K/uL   Eosinophils Relative 2 %   Eosinophils Absolute 0.2 0.0 - 0.5 K/uL   Basophils Relative 0 %   Basophils Absolute 0.1 0.0 - 0.1 K/uL   Immature Granulocytes 2 %   Abs Immature Granulocytes 0.31 (H) 0.00 - 0.07 K/uL    Comment: Performed at [facility], [address] [place], [place] [address]  Comprehensive metabolic panel     Status: Abnormal   Collection Time: 09/07/23  6:09 AM  Result Value Ref Range   Sodium 143 135 - 145 mmol/L   Potassium 3.5 3.5 - 5.1 mmol/L   Chloride 114 (H) 98 - 111 mmol/L   CO2 20 (L) 22 - 32 mmol/L   Glucose, Bld 105 (H) 70 - 99 mg/dL    Comment: Glucose reference range applies only to samples taken after fasting for at least 8 hours.   BUN 26 (H) 8 - 23 mg/dL   Creatinine, Ser 4.69 0.61 - 1.24 mg/dL   Calcium 8.0 (L) 8.9 - 10.3 mg/dL   Total Protein 5.8 (L) 6.5 - 8.1 g/dL   Albumin 2.6 (L) 3.5 - 5.0 g/dL   AST 45 (H) 15 - 41 U/L  ALT 53 (H) 0 - 44 U/L   Alkaline Phosphatase 76 38 - 126 U/L   Total Bilirubin 0.8 <1.2 mg/dL   GFR, Estimated >96 >29 mL/min    Comment: (NOTE) Calculated using the CKD-EPI Creatinine Equation (2021)     Anion gap 9 5 - 15    Comment: Performed at [facility], [address] [place], [place] [address]  Glucose, capillary     Status: None   Collection Time: 09/07/23  8:01 AM  Result Value Ref Range   Glucose-Capillary 92 70 - 99 mg/dL    Comment: Glucose reference range applies only to samples taken after fasting for at least 8 hours.  Glucose, capillary     Status: Abnormal   Collection Time: 09/07/23 11:45 AM  Result Value Ref Range   Glucose-Capillary 133 (H) 70 - 99 mg/dL    Comment: Glucose reference range applies only to samples taken after fasting for at least 8 hours.  Glucose, capillary     Status: None   Collection Time: 09/07/23  3:35 PM  Result Value Ref Range   Glucose-Capillary 92 70 - 99 mg/dL    Comment: Glucose reference range applies only to samples taken after fasting for at least 8 hours.  Glucose, capillary     Status: Abnormal   Collection Time: 09/07/23  9:04 PM  Result Value Ref Range   Glucose-Capillary 113 (H) 70 - 99 mg/dL    Comment: Glucose reference range applies only to samples taken after fasting for at least 8 hours.  Glucose, capillary     Status: Abnormal   Collection Time: 09/08/23 12:12 AM  Result Value Ref Range   Glucose-Capillary 150 (H) 70 - 99 mg/dL    Comment: Glucose reference range applies only to samples taken after fasting for at least 8 hours.  Glucose, capillary     Status: None   Collection Time: 09/08/23  5:51 AM  Result Value Ref Range   Glucose-Capillary 92 70 - 99 mg/dL    Comment: Glucose reference range applies only to samples taken after fasting for at least 8 hours.  CBC with Differential/Platelet     Status: Abnormal   Collection Time: 09/08/23  7:46 AM  Result Value Ref Range   WBC 21.7 (H) 4.0 - 10.5 K/uL   RBC 3.51 (L) 4.22 - 5.81 MIL/uL   Hemoglobin 9.8 (L) 13.0 - 17.0 g/dL   HCT 32.4 (L) 40.1 - 02.7 %   MCV 85.5 80.0 - 100.0 fL   MCH 27.9 26.0 - 34.0 pg   MCHC 32.7 30.0 - 36.0 g/dL   RDW 25.3 (H) 66.4  - 15.5 %   Platelets 295 150 - 400 K/uL   nRBC 0.4 (H) 0.0 - 0.2 %   Neutrophils Relative % 84 %   Neutro Abs 18.2 (H) 1.7 - 7.7 K/uL   Lymphocytes Relative 7 %   Lymphs Abs 1.6 0.7 - 4.0 K/uL   Monocytes Relative 6 %   Monocytes Absolute 1.4 (H) 0.1 - 1.0 K/uL   Eosinophils Relative 1 %   Eosinophils Absolute 0.3 0.0 - 0.5 K/uL   Basophils Relative 0 %   Basophils Absolute 0.1 0.0 - 0.1 K/uL   Immature Granulocytes 2 %   Abs Immature Granulocytes 0.33 (H) 0.00 - 0.07 K/uL    Comment: Performed at [facility], [address], [place], [place] [address]  Basic metabolic panel     Status: Abnormal   Collection Time: 09/08/23  7:46 AM  Result Value Ref Range   Sodium 139 135 - 145 mmol/L   Potassium 3.6 3.5 - 5.1 mmol/L   Chloride 112 (H) 98 - 111 mmol/L   CO2 19 (L) 22 - 32 mmol/L   Glucose, Bld 112 (H) 70 - 99 mg/dL    Comment: Glucose reference range applies only to samples taken after fasting for at least 8 hours.   BUN 24 (H) 8 - 23 mg/dL   Creatinine, Ser 1.30 0.61 - 1.24 mg/dL   Calcium 8.2 (L) 8.9 - 10.3 mg/dL   GFR, Estimated >86 >57 mL/min    Comment: (NOTE) Calculated using the CKD-EPI Creatinine Equation (2021)    Anion gap 8 5 - 15    Comment: Performed at [facility], [address] [place], [place] [address]  Glucose, capillary     Status: Abnormal   Collection Time: 09/08/23  9:02 AM  Result Value Ref Range   Glucose-Capillary 69 (L) 70 - 99 mg/dL    Comment: Glucose reference range applies only to samples taken after fasting for at least 8 hours.   No results found.    Blood pressure 127/77, pulse 86, temperature 98.7 F (37.1 C), resp. rate 20, height 5\' 6"  (1.676 m), weight 69 kg, SpO2 98%.  Medical Problem List and Plan: 1. Functional deficits secondary to hypoxic encephalopathy and debility after cardiac arrest and respiratory failure  -patient may shower  -ELOS/Goals: 5-7 days with mod I to supervision goals with PT, OT,  SLP 2.  Antithrombotics: -DVT/anticoagulation:  Mechanical: Sequential compression devices, below knee Bilateral lower extremities  -antiplatelet therapy: DAPT 3. Pain Management: Tylenol prn.  4. Mood/Behavior/Sleep: LCSW to follow for evaluation and  support.   --continue trazodone for sleep wake disruption.   -antipsychotic agents: Seroquel for delirium/sleep.  5. Neuropsych/cognition: This patient is not fully capable of making decisions on his own behalf. 6. Skin/Wound Care: routine pressure relief measures.   --Silvadene added to area on mid sternum.    7. Fluids/Electrolytes/Nutrition: Monitor I/O. Check CMET ina m.  8. STEMI/hx of CABG  : Jonne Ply resumed on 11/10 and Plavix resumed today.  --monitor for recurrent signs of bleeding.   --add low salt restrictions. Monitor for signs of overload  --on Metoprolol, losartan, DAPT, Litpitor,  9. Aspiration PNA: Treated with IV Zosyn X 7 days thru 11/09 10. Leucocytosis: Has had rise in WBC to 21.7-->monitor for fevers and other signs of infection  --Encourage pulmonary hygiene. Monitor for symptoms of pancreatitis.   11. ABLA: On DAPT since again since 11/10 and HH stable around 9.8.  --hemoptysis ongoing per patient --continue to monitor for trend with  plavix resumed on 11/11   12. Acute renal failure: Due to cardiogenic shock and resolving.  --BUN/SCr trend 15/1.24-->34/2.19-->24/1.11 --avoid nephrotoxic meds. Monitor renal status with serial checks 13. Abnormal LFTs: Improving  -Continue to educate on cessation of ETOH/Drug use 14. Pancreatitis: Monitor for symptoms--amylase/lipase  739/149 @ admission --educate on importance of cessation of polysubstance use.  15. Thrombocytopenia: Has resolved. Recheck plateletes in am.     Jacquelynn Cree, PA-C 09/08/2023

## 2023-09-08 NOTE — H&P (Signed)
Physical Medicine and Rehabilitation Admission H&P        Chief Complaint  Patient presents with   Functional deficits due to ABI      HPI:  Mark Mack is a 67 year old male with history of CAD s/p CABG 2019, HTN, ETOH abuse, tobacco use who was admitted to [facility] on 08/31/23 with witnessed cardiac arrest with multiple rounds of CPR with ROSC.  UDS positive for THC, cocaine and benzo. He was found to have inferior STEMI and taken to cath lab for [facility] showing occulusion in ostial circumflex and proximal and mid right coronaries which were unable to be crossed per Dr. Juliann Pares. He was started on IV heparin but developed alveolar hemorrhage requiring bronch with therapeutic aspiration of blood from right and left main bronchi felt to be due to lung contusion from CPR/Pulmonary edema and required bivalirudin as well as stress dose steroids. CTA chest was negative for PE but showed consolidation of BLL, central and upper lung secondary to aspiration and pulmonary edema as well as concerns of hemorrhagic pancreatitis. [name] He was started on IV Zosyn, milrionine and required multiple pressors. Plavix/heparin held due to bleed and abnormal LFTs due to shocked liver being monitored.    EEG done due to comatose state and showed moderate diffuse encephalopathy. He tolerated extubation  to [facility] 11/06 and post arrest encephalopathy improving therefore no need for MRI brain per Dr. Amada Jupiter. 2 D echo showed EF 60-65% with mild to moderate PVR. CT chest/abdomen/pelvis done 11/05 showed persistent peripancreatic stranding  c/w acute pancreatitis, improvement in aspiration and signs of acute tubular necrosis.  Swallow evaluation showed concerns of aspiration and increased WOB with intake therefore diet slowly initiated to D3, thins and no straws due to aspiration risk.  Electrolyte abnormalities with AKI resolving and hypokalemia supplemented intermittently. He was trated with CIWA protocol and delirium has  resolved.        Review of Systems  Constitutional:  Negative for fever.  HENT:  Negative for hearing loss.   Respiratory:  Positive for cough. Negative for shortness of breath.        Reports blood is sputum is getting better.   Cardiovascular:  Positive for chest pain. Negative for palpitations.  Gastrointestinal:  Negative for constipation and heartburn.  Genitourinary:  Negative for dysuria.  Musculoskeletal:  Negative for joint pain and myalgias.  Skin:  Negative for rash.  Neurological:  Positive for weakness.  Psychiatric/Behavioral:  The patient does not have insomnia.           Past Medical History:  Diagnosis Date   CAD (coronary artery disease)     Hypertension     OA (osteoarthritis) of hip      bilateral hips   Tobacco use                 Past Surgical History:  Procedure Laterality Date   CARDIAC SURGERY       CORONARY/GRAFT ACUTE MI REVASCULARIZATION N/A 08/31/2023    Procedure: Coronary/Graft Acute MI Revascularization;  Surgeon: Alwyn Pea, MD;  Location: [facility] INVASIVE CV LAB;  Service: Cardiovascular;  Laterality: N/A;   hip replacement Right     LEFT HEART CATH AND CORONARY ANGIOGRAPHY N/A 08/31/2023    Procedure: LEFT HEART CATH AND CORONARY ANGIOGRAPHY;  Surgeon: Alwyn Pea, MD;  Location: [facility] INVASIVE CV LAB;  Service: Cardiovascular;  Laterality: N/A;   TOTAL HIP ARTHROPLASTY Left 2021  No family history on file.         Social History:  Married. Retired a year ago--walks/golfs once a week. He  reports that he smokes about 5 cigarettes per day. He has never used smokeless tobacco. He drinks 6 beers/day.  He was positive for cocaine and marijuana--only when he parties.      Allergies:  Allergies  No Known Allergies             Medications Prior to Admission  Medication Sig Dispense Refill   acetaminophen (TYLENOL) 500 MG tablet Take 1,000 mg by mouth every 6 (six) hours as needed.        [START ON 09/09/2023]  aspirin 81 MG chewable tablet Chew 1 tablet (81 mg total) by mouth daily.       [START ON 09/09/2023] atorvastatin (LIPITOR) 40 MG tablet Take 1 tablet (40 mg total) by mouth daily.       [START ON 09/09/2023] clopidogrel (PLAVIX) 75 MG tablet Take 1 tablet (75 mg total) by mouth daily.       docusate sodium (COLACE) 100 MG capsule Take 1 capsule (100 mg total) by mouth 2 (two) times daily as needed for mild constipation.       [START ON 09/09/2023] folic acid (FOLVITE) 1 MG tablet Take 1 tablet (1 mg total) by mouth daily.       [START ON 09/09/2023] losartan (COZAAR) 25 MG tablet Take 1 tablet (25 mg total) by mouth daily.       [START ON 09/09/2023] metoprolol succinate (TOPROL-XL) 25 MG 24 hr tablet Take 0.5 tablets (12.5 mg total) by mouth daily.       [START ON 09/09/2023] Multiple Vitamin (MULTIVITAMIN WITH MINERALS) TABS tablet Take 1 tablet by mouth daily.       Nutritional Supplements (FEEDING SUPPLEMENT, KATE FARMS STANDARD 1.4,) LIQD liquid Take 325 mLs by mouth 3 (three) times daily between meals.       pantoprazole (PROTONIX) 40 MG tablet Take 1 tablet (40 mg total) by mouth at bedtime.       polyethylene glycol (MIRALAX / GLYCOLAX) 17 g packet Take 17 g by mouth daily as needed for moderate constipation.       QUEtiapine (SEROQUEL) 25 MG tablet Take 1 tablet (25 mg total) by mouth at bedtime.       [START ON 09/09/2023] thiamine (VITAMIN B-1) 100 MG tablet Take 1 tablet (100 mg total) by mouth daily.       traZODone (DESYREL) 50 MG tablet Take 1 tablet (50 mg total) by mouth at bedtime.                  Home: Home Living Family/patient expects to be discharged to:: Private residence Living Arrangements: Spouse/significant other Available Help at Discharge:  (wife works, dtr works from home and grandson can help) Type of Home: House Home Access: Stairs to enter Secretary/administrator of Steps: 2 Home Layout: One level Bathroom Shower/Tub: Engineer, manufacturing systems:  Pharmacist, community: Yes  Lives With: Spouse   Functional History: Prior Function Prior Level of Function : Independent/Modified Independent Mobility Comments: retired, independent and active (was working on a car recently) ADLs Comments: independent   Functional Status:  Mobility: Bed Mobility Overal bed mobility: Needs Assistance Bed Mobility: Sit to Supine Supine to sit: Min assist, Mod assist, HOB elevated, Used rails Sit to supine: Supervision General bed mobility comments: NT pt in recliner pre/post tx Transfers Overall transfer level: Needs assistance Equipment used:  Rolling walker (2 wheels) Transfers: Sit to/from Stand Sit to Stand: Supervision General transfer comment: multiple transfers during session for hand placement education and carryover Ambulation/Gait Ambulation/Gait assistance: Contact guard assist, +2 safety/equipment Gait Distance (Feet): 30 Feet Assistive device: Rolling walker (2 wheels) Gait Pattern/deviations: Step-through pattern, Trunk flexed General Gait Details: deferred.  focused on functional mobility skills, balance and ex. Gait velocity: decreased Pre-gait activities: Mod A +2 person for weight shifting and marching in place. Standing activity tolerance limited for progression of ambulation   ADL: ADL Overall ADL's : Needs assistance/impaired Grooming: Oral care, Standing, Cueing for sequencing, Cueing for safety Grooming Details (indicate cue type and reason): Sink level Lower Body Dressing: Maximal assistance, Bed level Lower Body Dressing Details (indicate cue type and reason): socks Toileting- Clothing Manipulation and Hygiene: Maximal assistance, Sit to/from stand, Cueing for sequencing Toileting - Clothing Manipulation Details (indicate cue type and reason): Pt aware of being soiled on this date Functional mobility during ADLs: Supervision/safety, Set up, Rolling walker (2 wheels), Cueing for sequencing, Cueing for  safety General ADL Comments: No physical assistance required, step by step verbal cues for sequencing, cueing for proper RW use. Pt ambulated to the bathroom and completed a toilet t/f with supervision+RW.   Cognition: Cognition Overall Cognitive Status: Within Functional Limits for tasks assessed Orientation Level: Oriented X4 Cognition Arousal: Alert Behavior During Therapy: WFL for tasks assessed/performed Overall Cognitive Status: Within Functional Limits for tasks assessed Area of Impairment: Orientation, Memory, Following commands, Safety/judgement, Problem solving Orientation Level: Situation, Disoriented to Memory: Decreased short-term memory, Decreased recall of precautions Following Commands: Follows multi-step commands inconsistently Safety/Judgement: Decreased awareness of safety, Decreased awareness of deficits Problem Solving: Slow processing, Decreased initiation, Difficulty sequencing, Requires verbal cues, Requires tactile cues General Comments: Pt highly motivated throughout session     Blood pressure 127/77, pulse 86, temperature 98.7 F (37.1 C), resp. rate 20, height 5\' 6"  (1.676 m), weight 69 kg, SpO2 98%. Physical Exam Vitals and nursing note reviewed.  Constitutional:      Appearance: Normal appearance.     Comments: Noted to be splinting at times due to mid sternal chest pain at site of burn from defibrillator?  HENT:     Head: Normocephalic.     Nose: Nose normal.  Eyes:     Extraocular Movements: Extraocular movements intact.     Conjunctiva/sclera: Conjunctivae normal.     Pupils: Pupils are equal, round, and reactive to light.  Cardiovascular:     Rate and Rhythm: Tachycardia present.     Heart sounds: No murmur heard.    No gallop.  Pulmonary:     Effort: Pulmonary effort is normal. No respiratory distress.     Breath sounds: Rales (at bases) present. No wheezing.     Comments: O2 6L via [place] Abdominal:     General: There is distension.      Tenderness: There is no abdominal tenderness.  Musculoskeletal:        General: No swelling, tenderness, deformity or signs of injury.     Cervical back: Normal range of motion.  Skin:    General: Skin is warm and dry.     Comments: Erythema right forearm around prior OS site. Granulation tissue on sternum  Neurological:     Mental Status: He is alert and oriented to person, place, and time.     Comments: Slow speech. Able to answer orientation questions and follow simple motor commands. Fair insight and awareness. No focal CN signs. Normal language  and speech. MMT: UE grossly 4/5 prox til 4+/5 distally. LE: 4/5 prox to distal bilaterally. Good sitting balance. No sensory abnl appreciated. DTR's 1+  Psychiatric:     Comments: Pt flat but cooperative        Lab Results Last 48 Hours        Results for orders placed or performed during the hospital encounter of 08/31/23 (from the past 48 hour(s))  Glucose, capillary     Status: None    Collection Time: 09/06/23  4:35 PM  Result Value Ref Range    Glucose-Capillary 92 70 - 99 mg/dL      Comment: Glucose reference range applies only to samples taken after fasting for at least 8 hours.  Glucose, capillary     Status: None    Collection Time: 09/06/23  9:34 PM  Result Value Ref Range    Glucose-Capillary 89 70 - 99 mg/dL      Comment: Glucose reference range applies only to samples taken after fasting for at least 8 hours.  Glucose, capillary     Status: None    Collection Time: 09/07/23 12:27 AM  Result Value Ref Range    Glucose-Capillary 82 70 - 99 mg/dL      Comment: Glucose reference range applies only to samples taken after fasting for at least 8 hours.  Glucose, capillary     Status: None    Collection Time: 09/07/23  3:00 AM  Result Value Ref Range    Glucose-Capillary 85 70 - 99 mg/dL      Comment: Glucose reference range applies only to samples taken after fasting for at least 8 hours.  Phosphorus     Status: None     Collection Time: 09/07/23  6:09 AM  Result Value Ref Range    Phosphorus 3.1 2.5 - 4.6 mg/dL      Comment: Performed at [facility], [address] [place], [place] [address]  Magnesium     Status: None    Collection Time: 09/07/23  6:09 AM  Result Value Ref Range    Magnesium 2.2 1.7 - 2.4 mg/dL      Comment: Performed at [facility], [address] [place], [place] [address]  CBC with Differential/Platelet     Status: Abnormal    Collection Time: 09/07/23  6:09 AM  Result Value Ref Range    WBC 15.6 (H) 4.0 - 10.5 K/uL    RBC 3.46 (L) 4.22 - 5.81 MIL/uL    Hemoglobin 9.7 (L) 13.0 - 17.0 g/dL    HCT 09.8 (L) 11.9 - 52.0 %    MCV 86.1 80.0 - 100.0 fL    MCH 28.0 26.0 - 34.0 pg    MCHC 32.6 30.0 - 36.0 g/dL    RDW 14.7 82.9 - 56.2 %    Platelets 200 150 - 400 K/uL    nRBC 1.1 (H) 0.0 - 0.2 %    Neutrophils Relative % 76 %    Neutro Abs 11.9 (H) 1.7 - 7.7 K/uL    Lymphocytes Relative 11 %    Lymphs Abs 1.8 0.7 - 4.0 K/uL    Monocytes Relative 9 %    Monocytes Absolute 1.3 (H) 0.1 - 1.0 K/uL    Eosinophils Relative 2 %    Eosinophils Absolute 0.2 0.0 - 0.5 K/uL    Basophils Relative 0 %    Basophils Absolute 0.1 0.0 - 0.1 K/uL    Immature Granulocytes 2 %    Abs  Immature Granulocytes 0.31 (H) 0.00 - 0.07 K/uL      Comment: Performed at [facility], [address] [place], [place] [address]  Comprehensive metabolic panel     Status: Abnormal    Collection Time: 09/07/23  6:09 AM  Result Value Ref Range    Sodium 143 135 - 145 mmol/L    Potassium 3.5 3.5 - 5.1 mmol/L    Chloride 114 (H) 98 - 111 mmol/L    CO2 20 (L) 22 - 32 mmol/L    Glucose, Bld 105 (H) 70 - 99 mg/dL      Comment: Glucose reference range applies only to samples taken after fasting for at least 8 hours.    BUN 26 (H) 8 - 23 mg/dL    Creatinine, Ser 6.96 0.61 - 1.24 mg/dL    Calcium 8.0 (L) 8.9 - 10.3 mg/dL    Total Protein 5.8 (L) 6.5 - 8.1 g/dL    Albumin 2.6 (L) 3.5 -  5.0 g/dL    AST 45 (H) 15 - 41 U/L    ALT 53 (H) 0 - 44 U/L    Alkaline Phosphatase 76 38 - 126 U/L    Total Bilirubin 0.8 <1.2 mg/dL    GFR, Estimated >29 >52 mL/min      Comment: (NOTE) Calculated using the CKD-EPI Creatinine Equation (2021)      Anion gap 9 5 - 15      Comment: Performed at [facility], [address] [place], [place] [address]  Glucose, capillary     Status: None    Collection Time: 09/07/23  8:01 AM  Result Value Ref Range    Glucose-Capillary 92 70 - 99 mg/dL      Comment: Glucose reference range applies only to samples taken after fasting for at least 8 hours.  Glucose, capillary     Status: Abnormal    Collection Time: 09/07/23 11:45 AM  Result Value Ref Range    Glucose-Capillary 133 (H) 70 - 99 mg/dL      Comment: Glucose reference range applies only to samples taken after fasting for at least 8 hours.  Glucose, capillary     Status: None    Collection Time: 09/07/23  3:35 PM  Result Value Ref Range    Glucose-Capillary 92 70 - 99 mg/dL      Comment: Glucose reference range applies only to samples taken after fasting for at least 8 hours.  Glucose, capillary     Status: Abnormal    Collection Time: 09/07/23  9:04 PM  Result Value Ref Range    Glucose-Capillary 113 (H) 70 - 99 mg/dL      Comment: Glucose reference range applies only to samples taken after fasting for at least 8 hours.  Glucose, capillary     Status: Abnormal    Collection Time: 09/08/23 12:12 AM  Result Value Ref Range    Glucose-Capillary 150 (H) 70 - 99 mg/dL      Comment: Glucose reference range applies only to samples taken after fasting for at least 8 hours.  Glucose, capillary     Status: None    Collection Time: 09/08/23  5:51 AM  Result Value Ref Range    Glucose-Capillary 92 70 - 99 mg/dL      Comment: Glucose reference range applies only to samples taken after fasting for at least 8 hours.  CBC with Differential/Platelet     Status: Abnormal    Collection Time:  09/08/23  7:46 AM  Result Value Ref Range    WBC 21.7 (H) 4.0 - 10.5 K/uL    RBC 3.51 (L) 4.22 - 5.81 MIL/uL    Hemoglobin 9.8 (L) 13.0 - 17.0 g/dL    HCT 54.0 (L) 98.1 - 52.0 %    MCV 85.5 80.0 - 100.0 fL    MCH 27.9 26.0 - 34.0 pg    MCHC 32.7 30.0 - 36.0 g/dL    RDW 19.1 (H) 47.8 - 15.5 %    Platelets 295 150 - 400 K/uL    nRBC 0.4 (H) 0.0 - 0.2 %    Neutrophils Relative % 84 %    Neutro Abs 18.2 (H) 1.7 - 7.7 K/uL    Lymphocytes Relative 7 %    Lymphs Abs 1.6 0.7 - 4.0 K/uL    Monocytes Relative 6 %    Monocytes Absolute 1.4 (H) 0.1 - 1.0 K/uL    Eosinophils Relative 1 %    Eosinophils Absolute 0.3 0.0 - 0.5 K/uL    Basophils Relative 0 %    Basophils Absolute 0.1 0.0 - 0.1 K/uL    Immature Granulocytes 2 %    Abs Immature Granulocytes 0.33 (H) 0.00 - 0.07 K/uL      Comment: Performed at [facility], [address] [place], [place] [address]  Basic metabolic panel     Status: Abnormal    Collection Time: 09/08/23  7:46 AM  Result Value Ref Range    Sodium 139 135 - 145 mmol/L    Potassium 3.6 3.5 - 5.1 mmol/L    Chloride 112 (H) 98 - 111 mmol/L    CO2 19 (L) 22 - 32 mmol/L    Glucose, Bld 112 (H) 70 - 99 mg/dL      Comment: Glucose reference range applies only to samples taken after fasting for at least 8 hours.    BUN 24 (H) 8 - 23 mg/dL    Creatinine, Ser 1.30 0.61 - 1.24 mg/dL    Calcium 8.2 (L) 8.9 - 10.3 mg/dL    GFR, Estimated >86 >57 mL/min      Comment: (NOTE) Calculated using the CKD-EPI Creatinine Equation (2021)      Anion gap 8 5 - 15      Comment: Performed at [facility], [address] [place], [place] [address]  Glucose, capillary     Status: Abnormal    Collection Time: 09/08/23  9:02 AM  Result Value Ref Range    Glucose-Capillary 69 (L) 70 - 99 mg/dL      Comment: Glucose reference range applies only to samples taken after fasting for at least 8 hours.      Imaging Results (Last 48 hours)  No results found.          Blood pressure 127/77, pulse 86, temperature 98.7 F (37.1 C), resp. rate 20, height 5\' 6"  (1.676 m), weight 69 kg, SpO2 98%.   Medical Problem List and Plan: 1. 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  Antithrombotics: -DVT/anticoagulation:  Mechanical: Sequential compression devices, below knee Bilateral lower extremities             -antiplatelet therapy: DAPT 3. Pain Management: Tylenol prn.  4. Mood/Behavior/Sleep: LCSW  to follow for evaluation and  support.              --continue trazodone for sleep wake disruption.              -antipsychotic agents: Seroquel for delirium/sleep.  5. Neuropsych/cognition: This patient is not fully capable of making decisions on his own behalf. 6. Skin/Wound Care: routine pressure relief measures.              --Silvadene added to area on mid sternum.     7. Fluids/Electrolytes/Nutrition: Monitor I/O. Check CMET ina m.  8. STEMI/hx of CABG  : Jonne Ply resumed on 11/10 and Plavix resumed today.  --monitor for recurrent signs of bleeding.              --add low salt restrictions. Monitor for signs of overload             --on Metoprolol, losartan, DAPT, Litpitor,  9. Aspiration PNA: Treated with IV Zosyn X 7 days thru 11/09 10. Leucocytosis: Has had rise in WBC to 21.7-->monitor for fevers and other signs of infection             --Encourage pulmonary hygiene. Monitor for symptoms of pancreatitis.   11. ABLA: On DAPT since again since 11/10 and HH stable around 9.8.             --hemoptysis ongoing per patient --continue to monitor for trend with  plavix resumed on 11/11   12. Acute renal failure: Due to cardiogenic shock and resolving.  --BUN/SCr trend 15/1.24-->34/2.19-->24/1.11 --avoid nephrotoxic meds. Monitor renal status with serial checks 13. Abnormal  LFTs: Improving             -Continue to educate on cessation of ETOH/Drug use 14. Pancreatitis: Monitor for symptoms--amylase/lipase  739/149 @ admission --educate on importance of cessation of polysubstance use.  -drug abuse counseling as outpt 15. Thrombocytopenia: Has resolved. Recheck plateletes in am.        Jacquelynn Cree, PA-C 09/08/2023 I have personally performed a face to face diagnostic evaluation of this patient and formulated the key components of the plan.  Additionally, I have personally reviewed laboratory data, imaging studies, as well as relevant notes and concur with the physician assistant's documentation above.  The patient's status has not changed from the original H&P.  Any changes in documentation from the acute care chart have been noted above.  Ranelle Oyster, MD, [place]

## 2023-09-08 NOTE — Progress Notes (Signed)
PHARMACY CONSULT NOTE  Pharmacy Consult for Electrolyte Monitoring and Replacement   Recent Labs:  Potassium (mmol/L)  Date Value  09/08/2023 3.6   Magnesium (mg/dL)  Date Value  16/07/9603 2.2   Calcium (mg/dL)  Date Value  54/06/8118 8.2 (L)   Albumin (g/dL)  Date Value  14/78/2956 2.6 (L)   Phosphorus (mg/dL)  Date Value  21/30/8657 3.1   Sodium (mmol/L)  Date Value  09/08/2023 139   Assessment: 67 y.o. male with a past medical history of CAD s/p CABG x 4 2019, hypertension, hyperlipidemia who was brought to the ED on 08/31/2023 for witnessed cardiac arrest. Pharmacy is asked to follow and replace electrolytes.   Goal of Therapy:  Electrolytes WNL  Plan:  K 3.6, Patient with recent cardiac arrest and improved Scr. Will replace with Kcl 40 meq x 1 dose. Follow AM labs and replace as needed.  Mark Mack PharmD, BCPS 09/08/2023 8:27 AM

## 2023-09-09 DIAGNOSIS — G931 Anoxic brain damage, not elsewhere classified: Secondary | ICD-10-CM | POA: Diagnosis not present

## 2023-09-09 LAB — COMPREHENSIVE METABOLIC PANEL
ALT: 49 U/L — ABNORMAL HIGH (ref 0–44)
AST: 33 U/L (ref 15–41)
Albumin: 2.3 g/dL — ABNORMAL LOW (ref 3.5–5.0)
Alkaline Phosphatase: 103 U/L (ref 38–126)
Anion gap: 7 (ref 5–15)
BUN: 17 mg/dL (ref 8–23)
CO2: 19 mmol/L — ABNORMAL LOW (ref 22–32)
Calcium: 8.1 mg/dL — ABNORMAL LOW (ref 8.9–10.3)
Chloride: 110 mmol/L (ref 98–111)
Creatinine, Ser: 1.15 mg/dL (ref 0.61–1.24)
GFR, Estimated: >60 mL/min (ref >60)
Glucose, Bld: 148 mg/dL — ABNORMAL HIGH (ref 70–99)
Potassium: 3.8 mmol/L (ref 3.5–5.1)
Sodium: 136 mmol/L (ref 135–145)
Total Bilirubin: 0.5 mg/dL (ref <1.2)
Total Protein: 6 g/dL — ABNORMAL LOW (ref 6.5–8.1)

## 2023-09-09 LAB — VITAMIN D 25 HYDROXY (VIT D DEFICIENCY, FRACTURES): Vit D, 25-Hydroxy: 25 ng/mL — ABNORMAL LOW (ref 30–100)

## 2023-09-09 LAB — GLUCOSE, CAPILLARY
Glucose-Capillary: 70 mg/dL (ref 70–99)
Glucose-Capillary: 90 mg/dL (ref 70–99)

## 2023-09-09 LAB — CBC WITH DIFFERENTIAL/PLATELET
Abs Immature Granulocytes: 0.29 10*3/uL — ABNORMAL HIGH (ref 0.00–0.07)
Basophils Absolute: 0 10*3/uL (ref 0.0–0.1)
Basophils Relative: 0 %
Eosinophils Absolute: 0.2 10*3/uL (ref 0.0–0.5)
Eosinophils Relative: 1 %
HCT: 29 % — ABNORMAL LOW (ref 39.0–52.0)
Hemoglobin: 9.5 g/dL — ABNORMAL LOW (ref 13.0–17.0)
Immature Granulocytes: 1 %
Lymphocytes Relative: 8 %
Lymphs Abs: 1.7 10*3/uL (ref 0.7–4.0)
MCH: 28.2 pg (ref 26.0–34.0)
MCHC: 32.8 g/dL (ref 30.0–36.0)
MCV: 86.1 fL (ref 80.0–100.0)
Monocytes Absolute: 1.4 10*3/uL — ABNORMAL HIGH (ref 0.1–1.0)
Monocytes Relative: 6 %
Neutro Abs: 19.2 10*3/uL — ABNORMAL HIGH (ref 1.7–7.7)
Neutrophils Relative %: 84 %
Platelets: 440 10*3/uL — ABNORMAL HIGH (ref 150–400)
RBC: 3.37 MIL/uL — ABNORMAL LOW (ref 4.22–5.81)
RDW: 15.3 % (ref 11.5–15.5)
WBC: 22.8 10*3/uL — ABNORMAL HIGH (ref 4.0–10.5)
nRBC: 0 % (ref 0.0–0.2)

## 2023-09-09 MED ORDER — MAGNESIUM GLUCONATE 500 MG PO TABS
250.0000 mg | ORAL_TABLET | Freq: Every day | ORAL | Status: DC
Start: 2023-09-09 — End: 2023-09-12
  Administered 2023-09-09 – 2023-09-11 (×3): 250 mg via ORAL
  Filled 2023-09-09 (×3): qty 1

## 2023-09-09 MED ORDER — VITAMIN D 25 MCG (1000 UNIT) PO TABS
1000.0000 [IU] | ORAL_TABLET | Freq: Every day | ORAL | Status: DC
  Administered 2023-09-09 – 2023-09-10 (×2): 1000 [IU] via ORAL
  Filled 2023-09-09 (×2): qty 1

## 2023-09-09 NOTE — Plan of Care (Signed)
  Problem: RH Balance Goal: LTG Patient will maintain dynamic standing balance (PT) Description: LTG:  Patient will maintain dynamic standing balance with assistance during mobility activities (PT) Flowsheets (Taken 09/09/2023 1513) LTG: Pt will maintain dynamic standing balance during mobility activities with:: Independent with assistive device    Problem: Sit to Stand Goal: LTG:  Patient will perform sit to stand with assistance level (PT) Description: LTG:  Patient will perform sit to stand with assistance level (PT) Flowsheets (Taken 09/09/2023 1513) LTG: PT will perform sit to stand in preparation for functional mobility with assistance level: Independent with assistive device   Problem: RH Bed Mobility Goal: LTG Patient will perform bed mobility with assist (PT) Description: LTG: Patient will perform bed mobility with assistance, with/without cues (PT). Flowsheets (Taken 09/09/2023 1513) LTG: Pt will perform bed mobility with assistance level of: Independent with assistive device    Problem: RH Bed to Chair Transfers Goal: LTG Patient will perform bed/chair transfers w/assist (PT) Description: LTG: Patient will perform bed to chair transfers with assistance (PT). Flowsheets (Taken 09/09/2023 1513) LTG: Pt will perform Bed to Chair Transfers with assistance level: Independent with assistive device    Problem: RH Car Transfers Goal: LTG Patient will perform car transfers with assist (PT) Description: LTG: Patient will perform car transfers with assistance (PT). Flowsheets (Taken 09/09/2023 1513) LTG: Pt will perform car transfers with assist:: Set up assist    Problem: RH Ambulation Goal: LTG Patient will ambulate in controlled environment (PT) Description: LTG: Patient will ambulate in a controlled environment, # of feet with assistance (PT). Flowsheets (Taken 09/09/2023 1513) LTG: Pt will ambulate in controlled environ  assist needed:: Independent with assistive device LTG:  Ambulation distance in controlled environment: 123ft Goal: LTG Patient will ambulate in home environment (PT) Description: LTG: Patient will ambulate in home environment, # of feet with assistance (PT). Flowsheets (Taken 09/09/2023 1513) LTG: Pt will ambulate in home environ  assist needed:: Independent with assistive device LTG: Ambulation distance in home environment: 76ft   Problem: RH Stairs Goal: LTG Patient will ambulate up and down stairs w/assist (PT) Description: LTG: Patient will ambulate up and down # of stairs with assistance (PT) Flowsheets (Taken 09/09/2023 1513) LTG: Pt will ambulate up/down stairs assist needed:: Independent with assistive device LTG: Pt will  ambulate up and down number of stairs: 12 with 1 railing

## 2023-09-09 NOTE — Progress Notes (Signed)
Inpatient Rehabilitation  Patient information reviewed and entered into eRehab system by Oyuki Hogan M. Eri Mcevers, M.A., CCC/SLP, PPS Coordinator.  Information including medical coding, functional ability and quality indicators will be reviewed and updated through discharge.    

## 2023-09-09 NOTE — Plan of Care (Signed)
  Problem: Consults Goal: RH GENERAL PATIENT EDUCATION Description: See Patient Education module for education specifics. Outcome: Progressing   Problem: RH SAFETY Goal: RH STG ADHERE TO SAFETY PRECAUTIONS W/ASSISTANCE/DEVICE Description: STG Adhere to Safety Precautions With cues Assistance/Device. Outcome: Progressing   Problem: RH KNOWLEDGE DEFICIT GENERAL Goal: RH STG INCREASE KNOWLEDGE OF SELF CARE AFTER HOSPITALIZATION Description: Patient and wife will be able to manage care with medications and dietary modification using educational resources independently Outcome: Progressing   Problem: RH KNOWLEDGE DEFICIT Goal: RH STG INCREASE KNOWLEDGE OF HYPERTENSION Description: Patient and wife will be able to manage HTN with medications and dietary modification using educational resources independently Outcome: Progressing Goal: RH STG INCREASE KNOWLEGDE OF HYPERLIPIDEMIA Description: Patient and wife will be able to manage HLD with medications and dietary modification using educational resources independently Outcome: Progressing

## 2023-09-09 NOTE — Plan of Care (Signed)
  Problem: RH Swallowing Goal: LTG Patient will consume least restrictive diet using compensatory strategies with assistance (SLP) Description: LTG:  Patient will consume least restrictive diet using compensatory strategies with assistance (SLP) Flowsheets (Taken 09/09/2023 1627) LTG: Pt Patient will consume least restrictive diet using compensatory strategies with assistance of (SLP): Modified Independent

## 2023-09-09 NOTE — Evaluation (Signed)
Occupational Therapy Assessment and Plan  Patient Details  Name: Mark Mack MRN: [id number] Date of Birth: Mar 25, 1956  OT Diagnosis: acute pain, cognitive deficits, and muscle weakness (generalized) Rehab Potential: Rehab Potential (ACUTE ONLY): Excellent ELOS: 5-7 days   Today's Date: 09/09/2023 OT Individual Time: 2536-6440 OT Individual Time Calculation (min): 70 min     Hospital Problem: Principal Problem:   Anoxic brain injury (HCC)   Past Medical History:  Past Medical History:  Diagnosis Date   CAD (coronary artery disease)    Hypertension    OA (osteoarthritis) of hip    bilateral hips   Tobacco use    Past Surgical History:  Past Surgical History:  Procedure Laterality Date   CARDIAC SURGERY     CORONARY/GRAFT ACUTE MI REVASCULARIZATION N/A 08/31/2023   Procedure: Coronary/Graft Acute MI Revascularization;  Surgeon: Alwyn Pea, MD;  Location: [facility] INVASIVE CV LAB;  Service: Cardiovascular;  Laterality: N/A;   hip replacement Right    LEFT HEART CATH AND CORONARY ANGIOGRAPHY N/A 08/31/2023   Procedure: LEFT HEART CATH AND CORONARY ANGIOGRAPHY;  Surgeon: Alwyn Pea, MD;  Location: [facility] INVASIVE CV LAB;  Service: Cardiovascular;  Laterality: N/A;   TOTAL HIP ARTHROPLASTY Left 2021    Assessment & Plan Clinical Impression: .Mark Mack is a 67 year old male with history of CAD s/p CABG 2019, HTN, ETOH abuse, tobacco use who was admitted to [facility] on 08/31/23 with witnessed cardiac arrest with multiple rounds of CPR with ROSC.  UDS positive for THC, cocaine and benzo. He was found to have inferior STEMI and taken to cath lab for [facility] showing occulusion in ostial circumflex and proximal and mid right coronaries which were unable to be crossed per Dr. Juliann Pares. He was started on IV heparin but developed alveolar hemorrhage requiring bronch with therapeutic aspiration of blood from right and left main bronchi felt to be due to lung contusion from CPR/Pulmonary  edema and required bivalirudin as well as stress dose steroids. CTA chest was negative for PE but showed consolidation of BLL, central and upper lung secondary to aspiration and pulmonary edema as well as concerns of hemorrhagic pancreatitis. [name] He was started on IV Zosyn, milrionine and required multiple pressors. Plavix/heparin held due to bleed and abnormal LFTs due to shocked liver being monitored.    EEG done due to comatose state and showed moderate diffuse encephalopathy. He tolerated extubation  to [facility] 11/06 and post arrest encephalopathy improving therefore no need for MRI brain per Dr. Amada Jupiter. 2 D echo showed EF 60-65% with mild to moderate PVR. CT chest/abdomen/pelvis done 11/05 showed persistent peripancreatic stranding  c/w acute pancreatitis, improvement in aspiration and signs of acute tubular necrosis.  Swallow evaluation showed concerns of aspiration and increased WOB with intake therefore diet slowly initiated to D3, thins and no straws due to aspiration risk.  Electrolyte abnormalities with AKI resolving and hypokalemia supplemented intermittently. He was trated with CIWA protocol and delirium has resolved.         Patient transferred to CIR on 09/08/2023 .    Patient currently requires supervision with basic self-care skills secondary to muscle weakness, decreased cardiorespiratoy endurance and decreased oxygen support, delayed processing, and decreased balance strategies.  Prior to hospitalization, patient was fully  Patient will benefit from skilled intervention to increase independence with basic self-care skills prior to discharge home with care partner.  Anticipate patient will require intermittent supervision and follow up outpatient.  OT - End of Session Activity Tolerance: Tolerates 10 -  20 min activity with multiple rests Endurance Deficit: Yes Endurance Deficit Description: needs 2L of O2 to maintain sats >98% OT Assessment Rehab Potential (ACUTE ONLY): Excellent OT  Patient demonstrates impairments in the following area(s): Balance;Cognition;Endurance;Motor;Pain OT Basic ADL's Functional Problem(s): Grooming;Bathing;Dressing;Toileting OT Transfers Functional Problem(s): Toilet;Tub/Shower OT Additional Impairment(s): None OT Plan OT Intensity: Minimum of 1-2 x/day, 45 to 90 minutes OT Frequency: 5 out of 7 days OT Duration/Estimated Length of Stay: 5-7 days OT Treatment/Interventions: Balance/vestibular training;Cognitive remediation/compensation;DME/adaptive equipment instruction;Discharge planning;Functional mobility training;Patient/family education;Self Care/advanced ADL retraining;UE/LE Strength taining/ROM;Therapeutic Exercise;Therapeutic Activities;Psychosocial support;Pain management OT Self Feeding Anticipated Outcome(s): independent OT Basic Self-Care Anticipated Outcome(s): Mod I OT Toileting Anticipated Outcome(s): Mod I OT Bathroom Transfers Anticipated Outcome(s): Mod I OT Recommendation Patient destination: Home Follow Up Recommendations: Outpatient OT Equipment Recommended: Tub/shower seat   OT Evaluation Precautions/Restrictions  Precautions Precautions: Fall Precaution Comments: monitor Sp02 Restrictions Weight Bearing Restrictions: No   Pain Pain Assessment Pain Score: 3  Pain Type: Acute pain Pain Location: Rib cage Pain Descriptors / Indicators: Aching Pain Onset: With Activity Pain Intervention(s): Rest Home Living/Prior Functioning Home Living Living Arrangements: Spouse/significant other, Children, Other relatives Available Help at Discharge: Family Type of Home: House Home Access: Stairs to enter Secretary/administrator of Steps: 2 Home Layout: One level Bathroom Shower/Tub: Engineer, manufacturing systems: Standard  Lives With: Spouse Prior Function Level of Independence: Independent with basic ADLs, Independent with transfers, Independent with homemaking with ambulation  Able to Take Stairs?: Yes Driving:  Yes Vocation: Retired Administrator, sports Baseline Vision/History: 0 No visual deficits Ability to See in Adequate Light: 0 Adequate Patient Visual Report: No change from baseline Vision Assessment?: No apparent visual deficits Perception  Perception: Within Functional Limits Praxis Praxis: WFL Cognition Cognition Overall Cognitive Status: Impaired/Different from baseline Arousal/Alertness: Awake/alert Orientation Level: Person;Place;Situation Person: Oriented Place: Oriented Situation: Oriented Memory: Impaired Awareness: Appears intact Problem Solving: Impaired Problem Solving Impairment: Functional basic Safety/Judgment: Appears intact Comments: min cues during self care for safe techniques,  needed cue as he placed B legs in same pant leg hole and not aware Brief Interview for Mental Status (BIMS) Repetition of Three Words (First Attempt): 3 Temporal Orientation: Year: Missed by more than 5 years (repeated 2014 2x) Temporal Orientation: Month: Accurate within 5 days Temporal Orientation: Day: Correct Recall: "Sock": Yes, no cue required Recall: "Blue": Yes, no cue required Recall: "Bed": Yes, no cue required BIMS Summary Score: 12 Sensation Sensation Light Touch: Appears Intact Hot/Cold: Appears Intact Coordination Gross Motor Movements are Fluid and Coordinated: No Fine Motor Movements are Fluid and Coordinated: Yes Coordination and Movement Description: labored stepping Motor  Motor Motor - Skilled Clinical Observations: generalized weakness  Trunk/Postural Assessment  Postural Control Postural Control: Within Functional Limits  Balance Dynamic Sitting Balance Dynamic Sitting - Level of Assistance: 5: Stand by assistance Static Standing Balance Static Standing - Level of Assistance: 5: Stand by assistance Dynamic Standing Balance Dynamic Standing - Level of Assistance: 4: Min assist Extremity/Trunk Assessment RUE Assessment Active Range of Motion (AROM) Comments:  WFL General Strength Comments: WFL LUE Assessment General Strength Comments: [facility]  Care Tool Care Tool Self Care Eating   Eating Assist Level: Set up assist    Oral Care    Oral Care Assist Level: Supervision/Verbal cueing    Bathing   Body parts bathed by patient: Right arm;Left arm;Chest;Abdomen;Front perineal area;Buttocks;Right upper leg;Face;Left lower leg;Right lower leg;Left upper leg     Assist Level: Supervision/Verbal cueing    Upper Body Dressing(including orthotics)  What is the patient wearing?: Pull over shirt   Assist Level: Supervision/Verbal cueing    Lower Body Dressing (excluding footwear)   What is the patient wearing?: Underwear/pull up;Pants Assist for lower body dressing: Supervision/Verbal cueing    Putting on/Taking off footwear   What is the patient wearing?: Non-skid slipper socks Assist for footwear: Supervision/Verbal cueing       Care Tool Toileting Toileting activity   Assist for toileting: Supervision/Verbal cueing     Care Tool Bed Mobility Roll left and right activity   Roll left and right assist level: Independent    Sit to lying activity   Sit to lying assist level: Supervision/Verbal cueing    Lying to sitting on side of bed activity   Lying to sitting on side of bed assist level: the ability to move from lying on the back to sitting on the side of the bed with no back support.: Supervision/Verbal cueing     Care Tool Transfers Sit to stand transfer   Sit to stand assist level: Supervision/Verbal cueing    Chair/bed transfer   Chair/bed transfer assist level: Contact Guard/Touching assist (without RW)     Toilet transfer   Assist Level: Contact Guard/Touching assist (without RW)     Care Tool Cognition  Expression of Ideas and Wants Expression of Ideas and Wants: 4. Without difficulty (complex and basic) - expresses complex messages without difficulty and with speech that is clear and easy to understand  Understanding  Verbal and Non-Verbal Content Understanding Verbal and Non-Verbal Content: 4. Understands (complex and basic) - clear comprehension without cues or repetitions   Memory/Recall Ability Memory/Recall Ability : Current season;That he or she is in a hospital/hospital unit   Refer to Care Plan for Long Term Goals  SHORT TERM GOAL WEEK 1 OT Short Term Goal 1 (Week 1): STGs = LTGs  Recommendations for other services: None    Skilled Therapeutic Intervention ADL ADL ADL Comments: supervision with all ADL tasks with RW for support Mobility  Transfers Sit to Stand: Supervision/Verbal cueing Stand to Sit: Supervision/Verbal cueing  Pt seen for initial evaluation and ADL training with a focus on activity tolerance and breathing techniques.  Pt received in bed on 2L of O2 at 98% sat level. Had pt sit to EOB and removed O2 to determine how he was breathing on room air.  After a few minutes, his levels were only 90%.    Pt attempted ambulation from bed to sink without RW but was unsteady needing CGA.  With the RW he is supervision. Obtained portable O2 tank to use during sessiong.  Pt stood at sink for several minutes to complete shaving and oral care, sat to rest, then ambulated to toilet to undress with safety cues for doffing pants,  covered his IV and then pt stepped into shower.  Bathed with supervision and after drying off stepped to chair in bathroom to dress with min cues for donning pants.   His wife arrived at start of session and discussed with both the role of OT, discussed POC and pt's goals, and ELOS. Pt resting in bed With all needs met and bed alarm set.     Discharge Criteria: Patient will be discharged from OT if patient refuses treatment 3 consecutive times without medical reason, if treatment goals not met, if there is a change in medical status, if patient makes no progress towards goals or if patient is discharged from hospital.  The above assessment, treatment plan, treatment  alternatives and goals were discussed and mutually agreed upon: by patient and by family  Leiland Mihelich 09/09/2023, 10:36 AM

## 2023-09-09 NOTE — Plan of Care (Signed)
  Problem: RH Problem Solving Goal: LTG Patient will demonstrate problem solving for (SLP) Description: LTG:  Patient will demonstrate problem solving for basic/complex daily situations with cues  (SLP) Flowsheets (Taken 09/09/2023 1459) LTG: Patient will demonstrate problem solving for (SLP): Complex daily situations LTG Patient will demonstrate problem solving for: Supervision   Problem: RH Memory Goal: LTG Patient will demonstrate ability for day to day (SLP) Description: LTG:   Patient will demonstrate ability for day to day recall/carryover during cognitive/linguistic activities with assist  (SLP) Flowsheets (Taken 09/09/2023 1459) LTG: Patient will demonstrate ability for day to day recall: Daily complex information LTG: Patient will demonstrate ability for day to day recall/carryover during cognitive/linguistic activities with assist (SLP): Supervision   Problem: RH Awareness Goal: LTG: Patient will demonstrate awareness during functional activites type of (SLP) Description: LTG: Patient will demonstrate awareness during functional activites type of (SLP) Flowsheets (Taken 09/09/2023 1459) Patient will demonstrate during cognitive/linguistic activities awareness type of: Emergent LTG: Patient will demonstrate awareness during cognitive/linguistic activities with assistance of (SLP): Supervision

## 2023-09-09 NOTE — Plan of Care (Signed)
  Problem: RH Balance Goal: LTG Patient will maintain dynamic standing with ADLs (OT) Description: LTG:  Patient will maintain dynamic standing balance with assist during activities of daily living (OT)  Flowsheets (Taken 09/09/2023 1045) LTG: Pt will maintain dynamic standing balance during ADLs with: Independent   Problem: Sit to Stand Goal: LTG:  Patient will perform sit to stand in prep for activites of daily living with assistance level (OT) Description: LTG:  Patient will perform sit to stand in prep for activites of daily living with assistance level (OT) Flowsheets (Taken 09/09/2023 1045) LTG: PT will perform sit to stand in prep for activites of daily living with assistance level: Independent   Problem: RH Eating Goal: LTG Patient will perform eating w/assist, cues/equip (OT) Description: LTG: Patient will perform eating with assist, with/without cues using equipment (OT) Flowsheets (Taken 09/09/2023 1045) LTG: Pt will perform eating with assistance level of: Independent   Problem: RH Grooming Goal: LTG Patient will perform grooming w/assist,cues/equip (OT) Description: LTG: Patient will perform grooming with assist, with/without cues using equipment (OT) Flowsheets (Taken 09/09/2023 1045) LTG: Pt will perform grooming with assistance level of: Independent   Problem: RH Bathing Goal: LTG Patient will bathe all body parts with assist levels (OT) Description: LTG: Patient will bathe all body parts with assist levels (OT) Flowsheets (Taken 09/09/2023 1045) LTG: Pt will perform bathing with assistance level/cueing: Independent with assistive device  LTG: Position pt will perform bathing: Shower   Problem: RH Dressing Goal: LTG Patient will perform upper body dressing (OT) Description: LTG Patient will perform upper body dressing with assist, with/without cues (OT). Flowsheets (Taken 09/09/2023 1045) LTG: Pt will perform upper body dressing with assistance level of:  Independent Goal: LTG Patient will perform lower body dressing w/assist (OT) Description: LTG: Patient will perform lower body dressing with assist, with/without cues in positioning using equipment (OT) Flowsheets (Taken 09/09/2023 1045) LTG: Pt will perform lower body dressing with assistance level of: Independent with assistive device   Problem: RH Toileting Goal: LTG Patient will perform toileting task (3/3 steps) with assistance level (OT) Description: LTG: Patient will perform toileting task (3/3 steps) with assistance level (OT)  Flowsheets (Taken 09/09/2023 1045) LTG: Pt will perform toileting task (3/3 steps) with assistance level: Independent with assistive device   Problem: RH Toilet Transfers Goal: LTG Patient will perform toilet transfers w/assist (OT) Description: LTG: Patient will perform toilet transfers with assist, with/without cues using equipment (OT) Flowsheets (Taken 09/09/2023 1045) LTG: Pt will perform toilet transfers with assistance level of: Independent with assistive device   Problem: RH Tub/Shower Transfers Goal: LTG Patient will perform tub/shower transfers w/assist (OT) Description: LTG: Patient will perform tub/shower transfers with assist, with/without cues using equipment (OT) Flowsheets (Taken 09/09/2023 1045) LTG: Pt will perform tub/shower stall transfers with assistance level of: Independent with assistive device LTG: Pt will perform tub/shower transfers from: Tub/shower combination   Problem: RH Awareness Goal: LTG: Patient will demonstrate awareness during functional activites type of (OT) Description: LTG: Patient will demonstrate awareness during functional activites type of (OT) Flowsheets (Taken 09/09/2023 1045) Patient will demonstrate awareness during functional activites type of: Emergent LTG: Patient will demonstrate awareness during functional activites type of (OT): Independent

## 2023-09-09 NOTE — Progress Notes (Signed)
PROGRESS NOTE   Subjective/Complaints: No new complaints this morning Moving bowels regularly Denies pain Wife is at bedside  ROS: denies constipation   Objective:   DG Chest 2 View  Result Date: 09/08/2023 CLINICAL DATA:  Chest pain EXAM: CHEST - 2 VIEW COMPARISON:  Chest x-ray 09/02/2023 FINDINGS: Right IJ catheter has been removed. Bilateral multifocal airspace disease has mildly decreased bilaterally. There is no pleural effusion or pneumothorax. Patient is status post cardiac surgery. The cardiomediastinal silhouette appears stable. No fractures are seen. IMPRESSION: Bilateral multifocal airspace disease has mildly decreased bilaterally. Electronically Signed   By: Darliss Cheney M.D.   On: 09/08/2023 21:27   Recent Labs    09/07/23 0609 09/08/23 0746  WBC 15.6* 21.7*  HGB 9.7* 9.8*  HCT 29.8* 30.0*  PLT 200 295   Recent Labs    09/07/23 0609 09/08/23 0746  NA 143 139  K 3.5 3.6  CL 114* 112*  CO2 20* 19*  GLUCOSE 105* 112*  BUN 26* 24*  CREATININE 1.11 1.11  CALCIUM 8.0* 8.2*    Intake/Output Summary (Last 24 hours) at 09/09/2023 1318 Last data filed at 09/09/2023 0755 Gross per 24 hour  Intake 476 ml  Output 950 ml  Net -474 ml        Physical Exam: Vital Signs Blood pressure (!) 140/78, pulse 81, temperature 98.6 F (37 C), temperature source Oral, resp. rate 16, height 5\' 6"  (1.676 m), weight 65.8 kg, SpO2 96%. Gen: no distress, normal appearing HEENT: oral mucosa pink and moist, NCAT Cardio: Reg rate  Effort: Pulmonary effort is normal. No respiratory distress.     Breath sounds: Rales (at bases) present. No wheezing.     Comments: Off O2 Abdominal:     General: There is distension.     Tenderness: There is no abdominal tenderness.  Musculoskeletal:        General: No swelling, tenderness, deformity or signs of injury.     Cervical back: Normal range of motion.  Skin:    General: Skin  is warm and dry.     Comments: Erythema right forearm around prior OS site. Granulation tissue on sternum  Neurological:     Mental Status: He is alert and oriented to person, place, and time.     Comments: Slow speech. Able to answer orientation questions and follow simple motor commands. Fair insight and awareness. No focal CN signs. Normal language and speech. MMT: UE grossly 4/5 prox til 4+/5 distally. LE: 4/5 prox to distal bilaterally. Good sitting balance. No sensory abnl appreciated. DTR's 1+  Psychiatric:     Comments: Pt flat but cooperative      Assessment/Plan: 1. Functional deficits which require 3+ hours per day of interdisciplinary therapy in a comprehensive inpatient rehab setting. Physiatrist is providing close team supervision and 24 hour management of active medical problems listed below. Physiatrist and rehab team continue to assess barriers to discharge/monitor patient progress toward functional and medical goals  Care Tool:  Bathing    Body parts bathed by patient: Right arm, Left arm, Chest, Abdomen, Front perineal area, Buttocks, Right upper leg, Face, Left lower leg, Right lower leg, Left upper leg  Bathing assist Assist Level: Supervision/Verbal cueing     Upper Body Dressing/Undressing Upper body dressing   What is the patient wearing?: Pull over shirt    Upper body assist Assist Level: Supervision/Verbal cueing    Lower Body Dressing/Undressing Lower body dressing      What is the patient wearing?: Underwear/pull up, Pants     Lower body assist Assist for lower body dressing: Supervision/Verbal cueing     Toileting Toileting    Toileting assist Assist for toileting: Supervision/Verbal cueing     Transfers Chair/bed transfer  Transfers assist     Chair/bed transfer assist level: Supervision/Verbal cueing     Locomotion Ambulation   Ambulation assist      Assist level: Supervision/Verbal cueing Assistive device:  Walker-rolling Max distance: 320'   Walk 10 feet activity   Assist     Assist level: Supervision/Verbal cueing Assistive device: Walker-rolling   Walk 50 feet activity   Assist    Assist level: Supervision/Verbal cueing Assistive device: Walker-rolling    Walk 150 feet activity   Assist    Assist level: Supervision/Verbal cueing Assistive device: Walker-rolling    Walk 10 feet on uneven surface  activity   Assist     Assist level: Contact Guard/Touching assist     Wheelchair     Assist Is the patient using a wheelchair?: Yes Type of Wheelchair: Manual    Wheelchair assist level: Supervision/Verbal cueing Max wheelchair distance: 74'    Wheelchair 50 feet with 2 turns activity    Assist    Wheelchair 50 feet with 2 turns activity did not occur:  (fatigue)   Assist Level: Maximal Assistance - Patient 25 - 49%   Wheelchair 150 feet activity     Assist      Assist Level: Supervision/Verbal cueing, Maximal Assistance - Patient 25 - 49%   Blood pressure (!) 140/78, pulse 81, temperature 98.6 F (37 C), temperature source Oral, resp. rate 16, height 5\' 6"  (1.676 m), weight 65.8 kg, SpO2 96%.  Medical Problem List and Plan: 1. 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  Antithrombotics: -DVT/anticoagulation:  Mechanical: Sequential compression devices, below knee Bilateral lower extremities             -antiplatelet therapy: DAPT 3. Pain Management: Tylenol prn.  4. Mood/Behavior/Sleep: LCSW to follow for evaluation and  support.              --continue trazodone for sleep wake disruption.              -antipsychotic agents: Seroquel for delirium/sleep.  5. Neuropsych/cognition: This patient is not fully capable of making decisions on his own  behalf. 6. Skin/Wound Care: routine pressure relief measures.              --Silvadene added to area on mid sternum.     7. Fluids/Electrolytes/Nutrition: Monitor I/O. Check CMET ina m.  8. STEMI/hx of CABG  : Jonne Ply resumed on 11/10 and Plavix resumed today.  --monitor for recurrent signs of bleeding.              --add low salt restrictions. Monitor for signs of overload             --  on Metoprolol, losartan, DAPT, Litpitor,  9. Aspiration PNA: Treated with IV Zosyn X 7 days thru 11/09 10. Leucocytosis: Has had rise in WBC to 21.7-->monitor for fevers and other signs of infection             --Encourage pulmonary hygiene. Monitor for symptoms of pancreatitis.   11. ABLA: On DAPT since again since 11/10 and HH stable around 9.8.             --hemoptysis ongoing per patient --continue to monitor for trend with  plavix resumed on 11/11    12. Acute renal failure: Due to cardiogenic shock and resolving. Cr reviewed and has normalized  13. Abnormal LFTs: Improving             -Continue to educate on cessation of ETOH/Drug use  14. Pancreatitis: Monitor for symptoms--amylase/lipase  739/149 @ admission --educate on importance of cessation of polysubstance use.  -drug abuse counseling as outpt  15. Thrombocytopenia: Platelets reviewed and normalized.   16. HTN: mildly elevated, start magnesium gluconate 250mg  HS  17. Screening for vitamin D deficiency: add on vitamin D level today and start daily D3 supplement    LOS: 1 days A FACE TO FACE EVALUATION WAS PERFORMED  Novali Vollman P Darly Massi 09/09/2023, 1:18 PM

## 2023-09-09 NOTE — Evaluation (Addendum)
Speech Language Pathology Assessment and Plan  Patient Details  Name: Mark Mack MRN: [id number] Date of Birth: 05-22-1956  SLP Diagnosis: Cognitive Impairments;Dysphagia  Rehab Potential: Good ELOS: 5-7 days    Today's Date: 09/09/2023 SLP Individual Time: 4098-1191 SLP Individual Time Calculation (min): 57 min   Hospital Problem: Principal Problem:   Anoxic brain injury (HCC)  Past Medical History:  Past Medical History:  Diagnosis Date   CAD (coronary artery disease)    Hypertension    OA (osteoarthritis) of hip    bilateral hips   Tobacco use    Past Surgical History:  Past Surgical History:  Procedure Laterality Date   CARDIAC SURGERY     CORONARY/GRAFT ACUTE MI REVASCULARIZATION N/A 08/31/2023   Procedure: Coronary/Graft Acute MI Revascularization;  Surgeon: Alwyn Pea, MD;  Location: [facility] INVASIVE CV LAB;  Service: Cardiovascular;  Laterality: N/A;   hip replacement Right    LEFT HEART CATH AND CORONARY ANGIOGRAPHY N/A 08/31/2023   Procedure: LEFT HEART CATH AND CORONARY ANGIOGRAPHY;  Surgeon: Alwyn Pea, MD;  Location: [facility] INVASIVE CV LAB;  Service: Cardiovascular;  Laterality: N/A;   TOTAL HIP ARTHROPLASTY Left 2021    Assessment / Plan / Recommendation Clinical Impression  Mark Mack is a 67 year old male with history of CAD s/p CABG 2019, HTN, ETOH abuse, tobacco use who was admitted to [facility] on 08/31/23 with witnessed cardiac arrest with multiple rounds of CPR with ROSC.  UDS positive for THC, cocaine and benzo. He was found to have inferior STEMI and taken to cath lab for [facility] showing occulusion in ostial circumflex and proximal and mid right coronaries which were unable to be crossed per Dr. Juliann Pares. He was started on IV heparin but developed alveolar hemorrhage requiring bronch with therapeutic aspiration of blood from right and left main bronchi felt to be due to lung contusion from CPR/Pulmonary edema and required bivalirudin as well as stress  dose steroids. CTA chest was negative for PE but showed consolidation of BLL, central and upper lung secondary to aspiration and pulmonary edema as well as concerns of hemorrhagic pancreatitis. [name] He was started on IV Zosyn, milrionine and required multiple pressors. Plavix/heparin held due to bleed and abnormal LFTs due to shocked liver being monitored. EEG done due to comatose state and showed moderate diffuse encephalopathy. c/w acute pancreatitis, improvement in aspiration and signs of acute tubular necrosis.  Swallow evaluation showed concerns of aspiration and increased WOB with intake therefore diet slowly initiated to D3, thins and no straws due to aspiration risk.  Electrolyte abnormalities with AKI resolving and hypokalemia supplemented intermittently. He was trated with CIWA protocol and delirium has resolved. Pt was admitted to CIR on 09/09/23.  Patient presents with a moderate cognitive linguistic deficit. SLUMS administered with pt score of 16/30 (WNL >27). Strengths noted in orientation. Deficits observed in executive function, mental manipulation, delayed and immediate recall. Initially pt reports no change in cognitive function however as session persisted he endorsed "feeling cloudy." Informally, pt unable to verbalize events leading up to hospitalization and gave minimal information regarding PLOF. Additionally, pt demonstrates some decreased safety awareness and impulsivity. As SLP re-entered room, finding pt in the bathroom without [place]. Per wife, pt wouldn't let her call for help and he ambulated to bathroom indep without [place] as it wouldn't reach that far.  Wife present and reported pt was independent with driving, medication, and financial management PTA.   Bedside Swallow evaluation completed with solids and thin liquids. O2 Stats  taken prior to PO trials was at 95%. Pt consumed thin liquid via straw unremarkably. He consumed solids with appeared adequate bolus formation and transit with min  to no lingual residue and no s/sx pen/asp. SLP recommending continue on Dys 3 solids and thin liquids with plan to trial regular solids with SLP to ensure safety of diet upgrade. During session, pt endorsed bowel urgency and diarrhea post meals. Per wife, pt has poor PO intake and reports limited appetite.   Pt would benefit from skilled SLP services to maximize dysphagia and cognition in order to maximize his independence prior to discharge. Anticipate pt will require intermittent supervision at home and f/u home health SLP services.      Skilled Therapeutic Interventions          BSE, informal assessment measures, and SLUMS administered. Please see full report for additional details.     SLP Assessment  Patient will need skilled Speech Lanaguage Pathology Services during CIR admission    Recommendations  SLP Diet Recommendations: Dysphagia 3 (Mech soft);Thin Medication Administration: Whole meds with liquid (large pills crushed in applesauce) Supervision: Patient able to self feed;Intermittent supervision to cue for compensatory strategies Compensations: Minimize environmental distractions;Slow rate;Small sips/bites;Lingual sweep for clearance of pocketing;Follow solids with liquid Postural Changes and/or Swallow Maneuvers: Out of bed for meals;Seated upright 90 degrees;Upright 30-60 min after meal Oral Care Recommendations: Oral care BID Recommendations for Other Services: Neuropsych consult Patient destination: Home Follow up Recommendations: Outpatient SLP;24 hour supervision/assistance Equipment Recommended: None recommended by SLP    SLP Frequency 3 to 5 out of 7 days   SLP Duration  SLP Intensity  SLP Treatment/Interventions 5-7 days  Minumum of 1-2 x/day, 30 to 90 minutes  Cognitive remediation/compensation;Dysphagia/aspiration precaution training;Internal/external aids;Speech/Language facilitation;Environmental controls;Therapeutic Activities;Patient/family education;Functional  tasks    Pain Pain Assessment Pain Scale: 0-10 Pain Score: 0-No pain  Prior Functioning Cognitive/Linguistic Baseline: Within functional limits Type of Home: House  Lives With: Spouse Available Help at Discharge: Family Vocation: Retired  SLP Evaluation Cognition Overall Cognitive Status: Impaired/Different from baseline Arousal/Alertness: Awake/alert Orientation Level: Oriented X4 Year: 2024 Month: November Day of Week: Correct Attention: Sustained Sustained Attention: Appears intact Memory: Impaired Memory Impairment: Decreased recall of new information;Decreased short term memory Decreased Short Term Memory: Verbal basic;Functional basic Awareness: Impaired Awareness Impairment: Intellectual impairment Problem Solving: Impaired Problem Solving Impairment: Verbal basic;Functional basic Executive Function: Self Correcting Self Correcting: Impaired Self Correcting Impairment: Verbal basic;Functional basic Safety/Judgment: Appears intact  Comprehension Auditory Comprehension Overall Auditory Comprehension: Appears within functional limits for tasks assessed Expression Expression Primary Mode of Expression: Verbal Written Expression Dominant Hand: Right Oral Motor Oral Motor/Sensory Function Overall Oral Motor/Sensory Function: Within functional limits  Care Tool Care Tool Cognition Ability to hear (with hearing aid or hearing appliances if normally used Ability to hear (with hearing aid or hearing appliances if normally used): 0. Adequate - no difficulty in normal conservation, social interaction, listening to TV   Expression of Ideas and Wants Expression of Ideas and Wants: 4. Without difficulty (complex and basic) - expresses complex messages without difficulty and with speech that is clear and easy to understand   Understanding Verbal and Non-Verbal Content Understanding Verbal and Non-Verbal Content: 4. Understands (complex and basic) - clear comprehension  without cues or repetitions  Memory/Recall Ability Memory/Recall Ability : Current season;That he or she is in a hospital/hospital unit   Bedside Swallowing Assessment General Previous Swallow Assessment: none Temperature Spikes Noted: No Respiratory Status: Supplemental O2 delivered via (comment) (2L)  History of Recent Intubation: Yes Total duration of intubation (days): 2 days Date extubated: 09/02/23 Behavior/Cognition: Alert;Cooperative;Pleasant mood Oral Cavity - Dentition: Adequate natural dentition Self-Feeding Abilities: Able to feed self Vision: Functional for self-feeding Patient Positioning: Upright in bed Volitional Cough: Weak Volitional Swallow: Able to elicit  Oral Care Assessment Oral Assessment  (WDL): Exceptions to WDL Lips: Symmetrical Teeth: Missing (Comment) Tongue: Pink;Moist Mucous Membrane(s): Moist;Pink Saliva: Moist, saliva free flowing Level of Consciousness: Alert Is patient on any of following O2 devices?: None of the above Nutritional status: No high risk factors Oral Assessment Risk : Low Risk Ice Chips Ice chips: Not tested Thin Liquid Thin Liquid: Within functional limits Nectar Thick Nectar Thick Liquid: Not tested Honey Thick Honey Thick Liquid: Not tested Puree Puree: Not tested Solid Solid: Within functional limits BSE Assessment Risk for Aspiration Impact on safety and function: Mild aspiration risk (in setting of respiratory distress) Other Related Risk Factors: Deconditioning;Decreased respiratory status  Short Term Goals: Week 1: SLP Short Term Goal 1 (Week 1): STGs= LTGS due to ELOS  Refer to Care Plan for Long Term Goals  Recommendations for other services: None   Discharge Criteria: Patient will be discharged from SLP if patient refuses treatment 3 consecutive times without medical reason, if treatment goals not met, if there is a change in medical status, if patient makes no progress towards goals or if patient is  discharged from hospital.  The above assessment, treatment plan, treatment alternatives and goals were discussed and mutually agreed upon: by patient and by family  Renaee Munda 09/09/2023, 3:01 PM

## 2023-09-09 NOTE — Evaluation (Signed)
Physical Therapy Assessment and Plan  Patient Details  Name: Mark Mack MRN: [id number] Date of Birth: 1956-10-23  PT Diagnosis: Abnormality of gait, Cognitive deficits, Difficulty walking, Impaired cognition, and Muscle weakness Rehab Potential: Good ELOS: 4-6 days   Today's Date: 09/09/2023 PT Individual Time: 1015-1127 PT Individual Time Calculation (min): 72 min    Hospital Problem: Principal Problem:   Anoxic brain injury (HCC)   Past Medical History:  Past Medical History:  Diagnosis Date   CAD (coronary artery disease)    Hypertension    OA (osteoarthritis) of hip    bilateral hips   Tobacco use    Past Surgical History:  Past Surgical History:  Procedure Laterality Date   CARDIAC SURGERY     CORONARY/GRAFT ACUTE MI REVASCULARIZATION N/A 08/31/2023   Procedure: Coronary/Graft Acute MI Revascularization;  Surgeon: Alwyn Pea, MD;  Location: [facility] INVASIVE CV LAB;  Service: Cardiovascular;  Laterality: N/A;   hip replacement Right    LEFT HEART CATH AND CORONARY ANGIOGRAPHY N/A 08/31/2023   Procedure: LEFT HEART CATH AND CORONARY ANGIOGRAPHY;  Surgeon: Alwyn Pea, MD;  Location: [facility] INVASIVE CV LAB;  Service: Cardiovascular;  Laterality: N/A;   TOTAL HIP ARTHROPLASTY Left 2021    Assessment & Plan Clinical Impression: Patient is a 67 year old male with history of CAD s/p CABG 2019, HTN, ETOH abuse, tobacco use who was admitted to [facility] on 08/31/23 with witnessed cardiac arrest with multiple rounds of CPR with ROSC.  UDS positive for THC, cocaine and benzo. He was found to have inferior STEMI and taken to cath lab for [facility] showing occulusion in ostial circumflex and proximal and mid right coronaries which were unable to be crossed per Dr. Juliann Pares. He was started on IV heparin but developed alveolar hemorrhage requiring bronch with therapeutic aspiration of blood from right and left main bronchi felt to be due to lung contusion from CPR/Pulmonary edema and  required bivalirudin as well as stress dose steroids. CTA chest was negative for PE but showed consolidation of BLL, central and upper lung secondary to aspiration and pulmonary edema as well as concerns of hemorrhagic pancreatitis. [name] He was started on IV Zosyn, milrionine and required multiple pressors. Plavix/heparin held due to bleed and abnormal LFTs due to shocked liver being monitored.    EEG done due to comatose state and showed moderate diffuse encephalopathy. He tolerated extubation  to [facility] 11/06 and post arrest encephalopathy improving therefore no need for MRI brain per Dr. Amada Jupiter. 2 D echo showed EF 60-65% with mild to moderate PVR. CT chest/abdomen/pelvis done 11/05 showed persistent peripancreatic stranding  c/w acute pancreatitis, improvement in aspiration and signs of acute tubular necrosis.  Swallow evaluation showed concerns of aspiration and increased WOB with intake therefore diet slowly initiated to D3, thins and no straws due to aspiration risk.  Electrolyte abnormalities with AKI resolving and hypokalemia supplemented intermittently. He was trated with CIWA protocol and delirium has resolved.  Patient transferred to CIR on 09/08/2023 .   Patient currently requires supervision with mobility secondary to muscle weakness, decreased cardiorespiratoy endurance and decreased oxygen support, decreased problem solving and decreased memory, and decreased standing balance and decreased balance strategies.  Prior to hospitalization, patient was independent  with mobility and lived with Spouse in a House home.  Home access is 2Stairs to enter.  Patient will benefit from skilled PT intervention to maximize safe functional mobility, minimize fall risk, and decrease caregiver burden for planned discharge home with intermittent assist.  Anticipate patient will benefit from follow up OP at discharge.  PT - End of Session Activity Tolerance: Tolerates 10 - 20 min activity with multiple  rests Endurance Deficit: Yes Endurance Deficit Description: needs 2L of O2 to maintain sats >98% PT Assessment Rehab Potential (ACUTE/IP ONLY): Good PT Barriers to Discharge: New oxygen;Medication compliance PT Patient demonstrates impairments in the following area(s): Balance;Endurance;Motor PT Transfers Functional Problem(s): Bed Mobility;Bed to Chair;Car PT Locomotion Functional Problem(s): Ambulation;Stairs PT Plan PT Intensity: Minimum of 1-2 x/day ,45 to 90 minutes PT Frequency: 5 out of 7 days PT Duration Estimated Length of Stay: 4-6 days PT Treatment/Interventions: Ambulation/gait training;Cognitive remediation/compensation;Discharge planning;DME/adaptive equipment instruction;Pain management;Functional mobility training;Psychosocial support;Therapeutic Activities;UE/LE Strength taining/ROM;Wheelchair propulsion/positioning;UE/LE Coordination activities;Therapeutic Exercise;Stair training;Patient/family education;Neuromuscular re-education;Functional electrical stimulation;Disease management/prevention;Community reintegration;Balance/vestibular training PT Transfers Anticipated Outcome(s): mod I PT Locomotion Anticipated Outcome(s): mod I PT Recommendation Recommendations for Other Services: Neuropsych consult Follow Up Recommendations: Outpatient PT Patient destination: Home Equipment Recommended: To be determined   PT Evaluation Precautions/Restrictions Precautions Precautions: Fall Precaution Comments: monitor Sp02 Restrictions Weight Bearing Restrictions: No General   Vital Signs  Pain Pain Assessment Pain Score: 3  Pain Type: Acute pain Pain Location: Rib cage Pain Descriptors / Indicators: Aching Pain Onset: With Activity Pain Intervention(s): Rest Pain Interference Pain Interference Pain Effect on Sleep: 0. Does not apply - I have not had any pain or hurting in the past 5 days Pain Interference with Therapy Activities: 0. Does not apply - I have not received  rehabilitationtherapy in the past 5 days Pain Interference with Day-to-Day Activities: 2. Occasionally Home Living/Prior Functioning Home Living Living Arrangements: Spouse/significant other;Children;Other relatives Available Help at Discharge: Family Type of Home: House Home Access: Stairs to enter [employer] of Steps: 2 Entrance Stairs-Rails: Right Home Layout: One level Bathroom Shower/Tub: Engineer, manufacturing systems: Standard  Lives With: Spouse Prior Function Level of Independence: Independent with basic ADLs;Independent with transfers;Independent with homemaking with ambulation  Able to Take Stairs?: Yes Driving: Yes Vocation: Retired Vision/Perception  Vision - History Ability to See in Adequate Light: 0 Adequate Vision - Assessment Ocular Range of Motion: Within Functional Limits Perception Perception: Within Functional Limits Praxis Praxis: WFL  Cognition Overall Cognitive Status: Impaired/Different from baseline Arousal/Alertness: Awake/alert Orientation Level: Oriented X4 Memory: Impaired Memory Impairment: Decreased recall of new information;Decreased short term memory Awareness: Appears intact Problem Solving: Impaired Problem Solving Impairment: Verbal basic;Functional basic Safety/Judgment: Appears intact Comments: min cues during self care for safe techniques,  needed cue as he placed B legs in same pant leg hole and not aware Sensation Sensation Light Touch: Appears Intact Hot/Cold: Appears Intact Proprioception: Appears Intact Stereognosis: Appears Intact Coordination Gross Motor Movements are Fluid and Coordinated: No Fine Motor Movements are Fluid and Coordinated: Yes Coordination and Movement Description: General deconditioning and weakness Motor  Motor Motor: Other (comment) Motor - Skilled Clinical Observations: generalized weakness   Trunk/Postural Assessment  Cervical Assessment Cervical Assessment: Within Functional  Limits Thoracic Assessment Thoracic Assessment: Within Functional Limits Lumbar Assessment Lumbar Assessment: Within Functional Limits Postural Control Postural Control: Within Functional Limits  Balance Balance Balance Assessed: Yes Static Sitting Balance Static Sitting - Balance Support: Feet supported;No upper extremity supported Static Sitting - Level of Assistance: 5: Stand by assistance Dynamic Sitting Balance Dynamic Sitting - Balance Support: During functional activity;Feet supported;No upper extremity supported Dynamic Sitting - Level of Assistance: 5: Stand by assistance Static Standing Balance Static Standing - Balance Support: Bilateral upper extremity supported Static Standing - Level of Assistance: 5: Stand by  assistance Dynamic Standing Balance Dynamic Standing - Balance Support: During functional activity;No upper extremity supported Dynamic Standing - Level of Assistance: 4: Min assist Extremity Assessment  RUE Assessment Active Range of Motion (AROM) Comments: WFL General Strength Comments: WFL LUE Assessment General Strength Comments: WFL RLE Assessment RLE Assessment: Exceptions to [facility] General Strength Comments: Grossly 4/5 LLE Assessment LLE Assessment: Exceptions to [facility] General Strength Comments: Grossly 4/5  Care Tool Care Tool Bed Mobility Roll left and right activity   Roll left and right assist level: Independent    Sit to lying activity   Sit to lying assist level: Supervision/Verbal cueing    Lying to sitting on side of bed activity   Lying to sitting on side of bed assist level: the ability to move from lying on the back to sitting on the side of the bed with no back support.: Supervision/Verbal cueing     Care Tool Transfers Sit to stand transfer   Sit to stand assist level: Supervision/Verbal cueing    Chair/bed transfer   Chair/bed transfer assist level: Supervision/Verbal cueing    Car transfer   Car transfer assist level:  Supervision/Verbal cueing      Care Tool Locomotion Ambulation   Assist level: Supervision/Verbal cueing Assistive device: Walker-rolling Max distance: 320'  Walk 10 feet activity   Assist level: Supervision/Verbal cueing Assistive device: Walker-rolling   Walk 50 feet with 2 turns activity   Assist level: Supervision/Verbal cueing Assistive device: Walker-rolling  Walk 150 feet activity   Assist level: Supervision/Verbal cueing Assistive device: Walker-rolling  Walk 10 feet on uneven surfaces activity   Assist level: Contact Guard/Touching assist    Stairs   Assist level: Contact Guard/Touching assist Stairs assistive device: 1 hand rail Max number of stairs: 8  Walk up/down 1 step activity   Walk up/down 1 step (curb) assist level: Contact Guard/Touching assist Walk up/down 1 step or curb assistive device: 1 hand rail  Walk up/down 4 steps activity   Walk up/down 4 steps assist level: Contact Guard/Touching assist Walk up/down 4 steps assistive device: 1 hand rail  Walk up/down 12 steps activity Walk up/down 12 steps activity did not occur: Safety/medical concerns (fatigue)      Pick up small objects from floor   Pick up small object from the floor assist level: Contact Guard/Touching assist    Wheelchair Is the patient using a wheelchair?: Yes Type of Wheelchair: Manual   Wheelchair assist level: Supervision/Verbal cueing Max wheelchair distance: 25'  Wheel 50 feet with 2 turns activity Wheelchair 50 feet with 2 turns activity did not occur:  (fatigue) Assist Level: Maximal Assistance - Patient 25 - 49%  Wheel 150 feet activity   Assist Level: Supervision/Verbal cueing;Maximal Assistance - Patient 25 - 49%    Refer to Care Plan for Long Term Goals  SHORT TERM GOAL WEEK 1 PT Short Term Goal 1 (Week 1): STG = LTG due to ELOS  Recommendations for other services: Neuropsych  Skilled Therapeutic Intervention Mobility Bed Mobility Bed Mobility: Supine to Sit;Sit  to Supine Supine to Sit: Supervision/Verbal cueing Sit to Supine: Supervision/Verbal cueing Transfers Transfers: Sit to Stand;Stand to Sit Sit to Stand: Supervision/Verbal cueing Stand to Sit: Supervision/Verbal cueing Transfer (Assistive device): Rolling walker Locomotion  Gait Ambulation: Yes Gait Assistance: Supervision/Verbal cueing Gait Distance (Feet): 320 Feet Assistive device: Rolling walker Gait Assistance Details: Verbal cues for gait pattern;Verbal cues for safe use of DME/AE;Verbal cues for precautions/safety Gait Gait: Yes Gait Pattern: Impaired Gait Pattern: Step-through pattern;Trunk  flexed Gait velocity: decreased Stairs / Additional Locomotion Stairs: Yes Stairs Assistance: Contact Guard/Touching assist Stair Management Technique: One rail Right;Step to pattern;Forwards Number of Stairs: 8 Height of Stairs: 6 Wheelchair Mobility Wheelchair Mobility: Yes Wheelchair Assistance: Doctor, general practice: Both upper extremities Wheelchair Parts Management: Needs assistance Distance: 25'  Skilled treatment: Pt in bed to start with his wife at bedside. Pt has no pain, other than when coughing. Pt pleasant and cooperative during assessment. On 2L wall O2 resting at 98%. Tolerated activity on 1L at 93% during session.   Functional mobility completed as outlined above. Overall, requires supervision assist and RW for AD for safe functional mobility. Functional outcome measures completed - , 5xSTS, and gait speed. 369ft with RW on 2L O2, 5xSTS 10.8 seconds, and gait speed 0.27 m/s.   Pt presents with generalized weakness and deconditioning, impaired balance and muscle weakness, and mild cognitive deficits.   Session concluded with patient in bed and all needs met, alarm on. Wife updated on patient's assessment results.    Discharge Criteria: Patient will be discharged from PT if patient refuses treatment 3 consecutive times without  medical reason, if treatment goals not met, if there is a change in medical status, if patient makes no progress towards goals or if patient is discharged from hospital.  The above assessment, treatment plan, treatment alternatives and goals were discussed and mutually agreed upon: by patient and by family  Orrin Brigham  PT, DPT, CSRS  09/09/2023, 11:27 AM

## 2023-09-09 NOTE — Progress Notes (Signed)
Inpatient Rehabilitation Care Coordinator Assessment and Plan Patient Details  Name: Mark Mack MRN: [id number] Date of Birth: Aug 07, 1956  Today's Date: 09/09/2023  Hospital Problems: Principal Problem:   Anoxic brain injury [facility])  Past Medical History:  Past Medical History:  Diagnosis Date   CAD (coronary artery disease)    Hypertension    OA (osteoarthritis) of hip    bilateral hips   Tobacco use    Past Surgical History:  Past Surgical History:  Procedure Laterality Date   CARDIAC SURGERY     CORONARY/GRAFT ACUTE MI REVASCULARIZATION N/A 08/31/2023   Procedure: Coronary/Graft Acute MI Revascularization;  Surgeon: Alwyn Pea, MD;  Location: [facility] INVASIVE CV LAB;  Service: Cardiovascular;  Laterality: N/A;   hip replacement Right    LEFT HEART CATH AND CORONARY ANGIOGRAPHY N/A 08/31/2023   Procedure: LEFT HEART CATH AND CORONARY ANGIOGRAPHY;  Surgeon: Alwyn Pea, MD;  Location: [facility] INVASIVE CV LAB;  Service: Cardiovascular;  Laterality: N/A;   TOTAL HIP ARTHROPLASTY Left 2021   Social History:  reports that he quit smoking about 5 years ago. He has never used smokeless tobacco. No history on file for alcohol use and drug use.  Family / Support Systems Marital Status: Married How Long?: 42 years Patient Roles: Spouse, Parent, Other (Comment) (retiree) Spouse/Significant Other: Talbert Forest [phone] Children: Two children daughter whom lives with them and another daughter Other Supports: 67 yo grandson who lives with pt and wife also Anticipated Caregiver: Wife, daughter and grandson Ability/Limitations of Caregiver: Wife is recovering from hip replacement surgery and will go back to work first of December, daughter works from home and 21 yo grandson is there also Caregiver Availability: 24/7 Family Dynamics: Close with family and freinds he feels he has good supports. He is very active and keeps himself busy. He was working on a car the day before  hospitalization  Social History Preferred language: English Religion:  Cultural Background: No issues Education: HS Health Literacy - How often do you need to have someone help you when you read instructions, pamphlets, or other written material from your doctor or pharmacy?: Never Writes: Yes Employment Status: Retired Marine scientist Issues: No issues Guardian/Conservator: None-according to MD pt is not fully capable of making his own decisions while here. Will look toward his wife to make any decisions while here   Abuse/Neglect Abuse/Neglect Assessment Can Be Completed: Yes Physical Abuse: Denies Verbal Abuse: Denies Sexual Abuse: Denies Exploitation of patient/patient's resources: Denies Self-Neglect: Denies  Patient response to: Social Isolation - How often do you feel lonely or isolated from those around you?: Never  Emotional Status Pt's affect, behavior and adjustment status: Pt is motivated to do well, he feels he is deconditioned mostly but is doing well. His wife is present and she feels he is doing well considering what has happened to him. He wants to get back to his independent level and knows it will take time to do this. Recent Psychosocial Issues: other health issues-had CABG in 2019 Psychiatric History: No history with all that has happened he would benefit from seeing neuro-psych while here and he was positive for substances on admission Substance Abuse History: Drink beer felt over did it-was positve for cocaine but will go back to discuss when pt alone wife is present currently. On neuro-psych list to be seen  Patient / Family Perceptions, Expectations & Goals Pt/Family understanding of illness & functional limitations: Pt and wife can explain his heart issues and cardiac arrest. Wife voiced they  tried to do CPR at home before EMS and fire truck got there. She has been here and talked with MD's involved and feels she has a good understanding of his  plan moving forward. Pt is still fussy on what happened. 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 "  [employer]: None Premorbid Home Care/DME Agencies: None Transportation available at discharge: self and family Is the patient able to respond to transportation needs?: Yes In the past 12 months, has lack of transportation kept you from medical appointments or from getting medications?: No In the past 12 months, has lack of transportation kept you from meetings, work, or from getting things needed for daily living?: No Resource referrals recommended: Neuropsychology  Discharge Planning Living Arrangements: Spouse/significant other, Children, Other relatives Support Systems: Spouse/significant other, Children, Other relatives, Friends/neighbors Type of Residence: Private residence Insurance Resources: Media planner (specify) Administrator Medicare) Financial Resources: Social Security, Family Support Financial Screen Referred: No Living Expenses: Own Money Management: Patient, Spouse Does the patient have any problems obtaining your medications?: No Home Management: wife Patient/Family Preliminary Plans: Return home with wife who is currently recovring from hip replacement surgery and will go back to work the first of [employer]. Their daughter and grandson reside with them and daughter works from home. So someone will be there with him at discharge. Aware being evaluated today and goals being set for stay here. Care Coordinator Barriers to Discharge: Insurance for SNF coverage Care Coordinator Anticipated Follow Up Needs: HH/OP  Clinical Impression Pleasant gentleman  who is very fortunate regarding what happened to him and how well he is doing now. His wife is present and supportive. Will await therapy team and aware will update after team conference tomorrow regarding goals and target discharge date. Neuro-psych to see while here.  Lucy Chris 09/09/2023, 10:27 AM

## 2023-09-09 NOTE — Progress Notes (Signed)
Inpatient Rehabilitation Center Individual Statement of Services  Patient Name:  Mark Mack  Date:  09/09/2023  Welcome to the Inpatient Rehabilitation Center.  Our goal is to provide you with an individualized program based on your diagnosis and situation, designed to meet your specific needs.  With this comprehensive rehabilitation program, you will be expected to participate in at least 3 hours of rehabilitation therapies Monday-Friday, with modified therapy programming on the weekends.  Your rehabilitation program will include the following services:  Physical Therapy (PT), Occupational Therapy (OT), Speech Therapy (ST), 24 hour per day rehabilitation nursing, Therapeutic Recreaction (TR), Neuropsychology, Care Coordinator, Rehabilitation Medicine, Nutrition Services, and Pharmacy Services  Weekly team conferences will be held on Wednesday to discuss your progress.  Your Inpatient Rehabilitation Care Coordinator will talk with you frequently to get your input and to update you on team discussions.  Team conferences with you and your family in attendance may also be held.  Expected length of stay: 5-7 days  Overall anticipated outcome: independent level  Depending on your progress and recovery, your program may change. Your Inpatient Rehabilitation Care Coordinator will coordinate services and will keep you informed of any changes. Your Inpatient Rehabilitation Care Coordinator's name and contact numbers are listed  below.  The following services may also be recommended but are not provided by the Inpatient Rehabilitation Center:  Driving Evaluations Home Health Rehabiltiation Services Outpatient Rehabilitation Services    Arrangements will be made to provide these services after discharge if needed.  Arrangements include referral to agencies that provide these services.  Your insurance has been verified to be:  [employer] Your primary doctor is:  Melody Haver  Pertinent  information will be shared with your doctor and your insurance company.  Inpatient Rehabilitation Care Coordinator:  Dossie Der, [place] [phone] or Luna Glasgow  Information discussed with and copy given to patient by: Lucy Chris, 09/09/2023, 10:28 AM

## 2023-09-10 DIAGNOSIS — F09 Unspecified mental disorder due to known physiological condition: Secondary | ICD-10-CM

## 2023-09-10 DIAGNOSIS — G931 Anoxic brain damage, not elsewhere classified: Secondary | ICD-10-CM | POA: Diagnosis not present

## 2023-09-10 LAB — CBC WITH DIFFERENTIAL/PLATELET
Abs Immature Granulocytes: 0.29 10*3/uL — ABNORMAL HIGH (ref 0.00–0.07)
Basophils Absolute: 0.1 10*3/uL (ref 0.0–0.1)
Basophils Relative: 0 %
Eosinophils Absolute: 0.1 10*3/uL (ref 0.0–0.5)
Eosinophils Relative: 1 %
HCT: 31 % — ABNORMAL LOW (ref 39.0–52.0)
Hemoglobin: 9.9 g/dL — ABNORMAL LOW (ref 13.0–17.0)
Immature Granulocytes: 1 %
Lymphocytes Relative: 8 %
Lymphs Abs: 1.7 10*3/uL (ref 0.7–4.0)
MCH: 27.7 pg (ref 26.0–34.0)
MCHC: 31.9 g/dL (ref 30.0–36.0)
MCV: 86.6 fL (ref 80.0–100.0)
Monocytes Absolute: 1.3 10*3/uL — ABNORMAL HIGH (ref 0.1–1.0)
Monocytes Relative: 6 %
Neutro Abs: 17.7 10*3/uL — ABNORMAL HIGH (ref 1.7–7.7)
Neutrophils Relative %: 84 %
Platelets: 545 10*3/uL — ABNORMAL HIGH (ref 150–400)
RBC: 3.58 MIL/uL — ABNORMAL LOW (ref 4.22–5.81)
RDW: 15.2 % (ref 11.5–15.5)
WBC: 21.2 10*3/uL — ABNORMAL HIGH (ref 4.0–10.5)
nRBC: 0 % (ref 0.0–0.2)

## 2023-09-10 MED ORDER — PNEUMOCOCCAL 20-VAL CONJ VACC 0.5 ML IM SUSY
0.5000 mL | PREFILLED_SYRINGE | INTRAMUSCULAR | Status: AC
  Administered 2023-09-11: 0.5 mL via INTRAMUSCULAR
  Filled 2023-09-10: qty 0.5

## 2023-09-10 MED ORDER — VITAMIN D 25 MCG (1000 UNIT) PO TABS
2000.0000 [IU] | ORAL_TABLET | Freq: Every day | ORAL | Status: DC
  Administered 2023-09-11: 2000 [IU] via ORAL
  Filled 2023-09-10: qty 2

## 2023-09-10 MED ORDER — L-METHYLFOLATE-B6-B12 3-35-2 MG PO TABS
1.0000 | ORAL_TABLET | Freq: Every day | ORAL | Status: DC
  Administered 2023-09-10 – 2023-09-12 (×3): 1 via ORAL
  Filled 2023-09-10 (×3): qty 1

## 2023-09-10 NOTE — Progress Notes (Signed)
PROGRESS NOTE   Subjective/Complaints: Dizzy after taking morning beds, BP normal, d/ced cozaar Discussed low vitamin D level, supplement started  ROS: denies constipation, denies pain   Objective:   DG Chest 2 View  Result Date: 09/08/2023 CLINICAL DATA:  Chest pain EXAM: CHEST - 2 VIEW COMPARISON:  Chest x-ray 09/02/2023 FINDINGS: Right IJ catheter has been removed. Bilateral multifocal airspace disease has mildly decreased bilaterally. There is no pleural effusion or pneumothorax. Patient is status post cardiac surgery. The cardiomediastinal silhouette appears stable. No fractures are seen. IMPRESSION: Bilateral multifocal airspace disease has mildly decreased bilaterally. Electronically Signed   By: Darliss Cheney M.D.   On: 09/08/2023 21:27   Recent Labs    09/08/23 0746 09/09/23 2105  WBC 21.7* 22.8*  HGB 9.8* 9.5*  HCT 30.0* 29.0*  PLT 295 440*   Recent Labs    09/08/23 0746 09/09/23 2105  NA 139 136  K 3.6 3.8  CL 112* 110  CO2 19* 19*  GLUCOSE 112* 148*  BUN 24* 17  CREATININE 1.11 1.15  CALCIUM 8.2* 8.1*    Intake/Output Summary (Last 24 hours) at 09/10/2023 0942 Last data filed at 09/10/2023 0730 Gross per 24 hour  Intake 600 ml  Output --  Net 600 ml        Physical Exam: Vital Signs Blood pressure 124/62, pulse 77, temperature 99.3 F (37.4 C), temperature source Oral, resp. rate 18, height 5\' 6"  (1.676 m), weight 63.1 kg, SpO2 98%. Gen: no distress, normal appearing HEENT: oral mucosa pink and moist, NCAT Cardio: Reg rate  Effort: Pulmonary effort is normal. No respiratory distress.     Breath sounds: Rales (at bases) present. No wheezing.     Comments: Off O2 Abdominal:     General: There is distension.     Tenderness: There is no abdominal tenderness.  Musculoskeletal:        General: No swelling, tenderness, deformity or signs of injury.     Cervical back: Normal range of motion.   Skin:    General: Skin is warm and dry.     Comments: Erythema right forearm around prior OS site. Granulation tissue on sternum  Neurological:     Mental Status: He is alert and oriented to person, place, and time.     Comments: Slow speech. Able to answer orientation questions and follow simple motor commands. Fair insight and awareness. No focal CN signs. Normal language and speech. MMT: UE grossly 4/5 prox til 4+/5 distally. LE: 4/5 prox to distal bilaterally. Good sitting balance. No sensory abnl appreciated. DTR's 1+  Psychiatric:     Comments: Friendly and appropriate     Assessment/Plan: 1. Functional deficits which require 3+ hours per day of interdisciplinary therapy in a comprehensive inpatient rehab setting. Physiatrist is providing close team supervision and 24 hour management of active medical problems listed below. Physiatrist and rehab team continue to assess barriers to discharge/monitor patient progress toward functional and medical goals  Care Tool:  Bathing    Body parts bathed by patient: Right arm, Left arm, Chest, Abdomen, Front perineal area, Buttocks, Right upper leg, Face, Left lower leg, Right lower leg, Left upper leg  Bathing assist Assist Level: Supervision/Verbal cueing     Upper Body Dressing/Undressing Upper body dressing   What is the patient wearing?: Pull over shirt    Upper body assist Assist Level: Supervision/Verbal cueing    Lower Body Dressing/Undressing Lower body dressing      What is the patient wearing?: Underwear/pull up, Pants     Lower body assist Assist for lower body dressing: Supervision/Verbal cueing     Toileting Toileting    Toileting assist Assist for toileting: Supervision/Verbal cueing     Transfers Chair/bed transfer  Transfers assist     Chair/bed transfer assist level: Supervision/Verbal cueing     Locomotion Ambulation   Ambulation assist      Assist level: Supervision/Verbal  cueing Assistive device: Walker-rolling Max distance: 320'   Walk 10 feet activity   Assist     Assist level: Supervision/Verbal cueing Assistive device: Walker-rolling   Walk 50 feet activity   Assist    Assist level: Supervision/Verbal cueing Assistive device: Walker-rolling    Walk 150 feet activity   Assist    Assist level: Supervision/Verbal cueing Assistive device: Walker-rolling    Walk 10 feet on uneven surface  activity   Assist     Assist level: Contact Guard/Touching assist     Wheelchair     Assist Is the patient using a wheelchair?: Yes Type of Wheelchair: Manual    Wheelchair assist level: Supervision/Verbal cueing Max wheelchair distance: 69'    Wheelchair 50 feet with 2 turns activity    Assist    Wheelchair 50 feet with 2 turns activity did not occur:  (fatigue)   Assist Level: Maximal Assistance - Patient 25 - 49%   Wheelchair 150 feet activity     Assist      Assist Level: Supervision/Verbal cueing, Maximal Assistance - Patient 25 - 49%   Blood pressure 124/62, pulse 77, temperature 99.3 F (37.4 C), temperature source Oral, resp. rate 18, height 5\' 6"  (1.676 m), weight 63.1 kg, SpO2 98%.  Medical Problem List and Plan: 1. 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  Antithrombotics: -DVT/anticoagulation:  Mechanical: Sequential compression devices, below knee Bilateral lower extremities             -antiplatelet therapy: DAPT 3. Pain Management: Tylenol prn.  4. Mood/Behavior/Sleep: LCSW to follow for evaluation and  support.              --continue trazodone for sleep wake disruption.              -antipsychotic agents: Seroquel for delirium/sleep.  5. Neuropsych/cognition: This patient is not fully capable of making  decisions on his own behalf. 6. Skin/Wound Care: routine pressure relief measures.              --Silvadene added to area on mid sternum.     7. Fluids/Electrolytes/Nutrition: Monitor I/O. Check CMET ina m.  8. STEMI/hx of CABG  : Jonne Ply resumed on 11/10 and Plavix resumed today.  --monitor for recurrent signs of bleeding.              --add low salt restrictions. Monitor for signs of overload             --on  Metoprolol, losartan, DAPT, Litpitor,  9. Aspiration PNA: Treated with IV Zosyn X 7 days thru 11/09 10. Leucocytosis: Has had rise in WBC to 21.7-->monitor for fevers and other signs of infection             --Encourage pulmonary hygiene. Monitor for symptoms of pancreatitis.   11. ABLA: On DAPT since again since 11/10 and HH stable around 9.8.             --hemoptysis ongoing per patient --continue to monitor for trend with  plavix resumed on 11/11    12. Acute renal failure: Due to cardiogenic shock and resolving. Cr reviewed and has normalized  13. Abnormal LFTs: Improving             -Continue to educate on cessation of ETOH/Drug use  14. Pancreatitis: Monitor for symptoms--amylase/lipase  739/149 @ admission --educate on importance of cessation of polysubstance use.  -drug abuse counseling as outpt  15. Thrombocytopenia: Platelets reviewed and normalized.   16. HTN: normalized, start magnesium gluconate 250mg  HS, d/c losartan  17. Screening for vitamin D deficiency: add on vitamin D level today and start daily D3 supplement, increase to 2,000U daily.   18. Dizziness: d/c losartan  19. Alcohol use: educated regarding potential B vitamin and magnesium deficiencies with alcohol use    LOS: 2 days A FACE TO FACE EVALUATION WAS PERFORMED  Clint Bolder P Adham Johnson 09/10/2023, 9:42 AM

## 2023-09-10 NOTE — Progress Notes (Signed)
Speech Language Pathology Daily Session Note  Patient Details  Name: Mark Mack MRN: [id number] Date of Birth: 1956-01-06  Today's Date: 09/10/2023 SLP Individual Time: 4098-1191 SLP Individual Time Calculation (min): 48 min  Short Term Goals: Week 1: SLP Short Term Goal 1 (Week 1): STGs= LTGS due to ELOS  Skilled Therapeutic Interventions:  Patient was seen in am to address cognitive re- training. Pt was alert and seated upright on EOB upon SLP arrival. Pt's wife present and participating intermittently throughout. SLP educated pt on WRAP compensatory strategies and examples of utilization within current environment and home. SLP challenged pt in recall of a moderate level paragraph presented verbally. SLP guided pt in repetition and association strategies. Given a 20 minute distracted delay, pt recalled information with 80% acc. SLP also addressed medication management. Given medical scenarios presented verbally, pt identified solutions with 100% acc indep. SLP further challenged pt in identification of medical errors given a schematic of a BID pil box. Pt requiring mod A during task with cues unable to be faded. SLP reviewed current medication list along with pt able to identify medications he took before hospitalization. Pt subsequently challenged to fill out a BID pill box. Pt completed task initially requiring mod A with cues able to be faded to min A. Pt only completed x3 medications before task conclusion due to time constraints. Pt left seated upright on EOB with call button within reach and bed alarm active. SLP to continue POC.  Pain Pain Assessment Pain Scale: 0-10 Pain Score: 2  Pain Type: Acute pain Pain Location: Rib cage Pain Descriptors / Indicators: Aching Pain Onset: With Activity  Therapy/Group: Individual Therapy  Renaee Munda 09/10/2023, 11:56 AM

## 2023-09-10 NOTE — Discharge Instructions (Addendum)
Inpatient Rehab Discharge Instructions  Jodan Baptist Discharge date and time: 09/12/23   Activities/Precautions/ Functional Status: Activity: no lifting, driving, or strenuous exercise till cleared by MD Diet: cardiac diet Wound Care:  apply at thin layer of silvadene and cover with dry dressing   Functional status:  ___ No restrictions     ___ Walk up steps independently ___ 24/7 supervision/assistance   ___ Walk up steps with assistance _X__ Intermittent supervision/assistance  ___ Bathe/dress independently ___ Walk with walker     ___ Bathe/dress with assistance ___ Walk Independently    ___ Shower independently ___ Walk with assistance    _X__ Shower with assistance _x__ No alcohol/Cocaine    ___ Return to work/school ________  Special Instructions: Cannot drive for 6 months or until cleared by Dr. Juliann Pares 2.  Continue to use incentive spirometer at least four times a day.    COMMUNITY REFERRALS UPON DISCHARGE:  Outpatient: PT   &   SP             Agency:[facility]   [phone]              Appointment Date/Time:WILL CONTACT WIFE TO SET UP FOLLOW UP APPOINTMENTS  Medical Equipment/Items Ordered:HAS NEEDED EQUIPMENT FROM OTHER SURGERIES                                                 Agency/Supplier:NA    My questions have been answered and I understand these instructions. I will adhere to these goals and the provided educational materials after my discharge from the hospital.  Patient/Caregiver Signature _______________________________ Date __________  Clinician Signature _______________________________________ Date __________  Please bring this form and your medication list with you to all your follow-up doctor's appointments.

## 2023-09-10 NOTE — Progress Notes (Signed)
Physical Therapy Session Note  Patient Details  Name: Mark Mack MRN: [id number] Date of Birth: [birth year]  Today's Date: 09/10/2023 PT Individual Time: 0915-1030 + 1300-1357  PT Individual Time Calculation (min): 75 min  + 57 min  Short Term Goals: Week 1:  PT Short Term Goal 1 (Week 1): STG = LTG due to ELOS  Skilled Therapeutic Interventions/Progress Updates:      1st session: Pt sitting EOB to start - reports he slept without O2 and tolerated his OT session this AM without supplemental O2. O2 monitored during treatment. Resting at 97% on RA and after >357ft of gait is at 94% on RA. No shortness of breath.   Sit<>stand to RW with supervision. Ambulates with supervision and RW from his room to main rehab gym, ~127ft. Pt reporting feeling slightly "dizzy" after taking all of his morning pills. BP checked and WNL - no significant drop in standing vs sitting.  Retrieved rollator to progress gait (only able to locate bariatric rollator). Educated patient on precautions and safety with rollator. Sit<>Stand with supervision and ambulates ~238ft + ~369ft with supervision - worked on increasing gait speed to his "typical" and "normal" speed rather than walking so cautiously.   Assisted onto Nustep and patient completed x10 minutes at L6 resistance using BUE/BLE, keeping cadence > 50 steps/minute. Achieving a total of 505 steps. O2 checked after = 96 % on RA.   Discussed general DC planning - pt reports feeling near ready for DC and will relay this during team conf this AM.   Pt ambulated back to his room with supervision and rollator, ~246ft. Ended treatment left sitting EOB with all needs met, family present.   2nd session: Pt sitting EOB to start - no reports of pain. Sit<>stand to rollator with supervision. Ambulates with supervision and rollator ~317ft with cues for walking at natural gait speed and keeping forward gaze.   BERG balance test and FGA gait testing completed with results  outlined below. Intermittent seated rest breaks as needed.  Patient demonstrates increased fall risk as noted by score of   48/56 on Berg Balance Scale.  (<36= high risk for falls, close to 100%; 37-45 significant >80%; 46-51 moderate >50%; 52-55 lower >25%)  Patient demonstrates increased fall risk as noted by score of 17/30 on  Functional Gait Assessment.   <22/30 = predictive of falls, <20/30 = fall in 6 months, <18/30 = predictive of falls in PD MCID: 5 points stroke population, 4 points geriatric population (ANPTA Core Set of Outcome Measures for Adults with Neurologic Conditions, 2018)   Pt completed UE scifit ergometer x8 minutes in standing with L3.5 resistance to challenge activity tolerance and general strengthening - supervision for safety while completing - no LOB or knee buckling observed. Brief seated rest break before ambulating back to his room with distant supervision and rollator, ~146ft. Ended treatment seated EOB, family present, all needs met.  Therapy Documentation Precautions:  Precautions Precautions: Fall Precaution Comments: monitor Sp02 Restrictions Weight Bearing Restrictions: No General:   Balance: Balance Balance Assessed: Yes Standardized Balance Assessment Standardized Balance Assessment: Berg Balance Test;Functional Gait Assessment Berg Balance Test Sit to Stand: Able to stand without using hands and stabilize independently Standing Unsupported: Able to stand safely 2 minutes Sitting with Back Unsupported but Feet Supported on Floor or Stool: Able to sit safely and securely 2 minutes Stand to Sit: Sits safely with minimal use of hands Transfers: Able to transfer safely, minor use of hands Standing Unsupported with Eyes Closed:  Able to stand 10 seconds safely Standing Ubsupported with Feet Together: Able to place feet together independently and stand for 1 minute with supervision From Standing, Reach Forward with Outstretched Arm: Can reach forward  >12 cm safely (5") From Standing Position, Pick up Object from Floor: Able to pick up shoe safely and easily From Standing Position, Turn to Look Behind Over each Shoulder: Looks behind from both sides and weight shifts well Turn 360 Degrees: Able to turn 360 degrees safely but slowly Standing Unsupported, Alternately Place Feet on Step/Stool: Able to stand independently and safely and complete 8 steps in 20 seconds Standing Unsupported, One Foot in Front: Able to take small step independently and hold 30 seconds Standing on One Leg: Able to lift leg independently and hold equal to or more than 3 seconds Total Score: 48 Functional Gait  Assessment Gait assessed : Yes Gait Level Surface: Walks 20 ft, slow speed, abnormal gait pattern, evidence for imbalance or deviates 10-15 in outside of the 12 in walkway width. Requires more than 7 sec to ambulate 20 ft. Change in Gait Speed: Able to change speed, demonstrates mild gait deviations, deviates 6-10 in outside of the 12 in walkway width, or no gait deviations, unable to achieve a major change in velocity, or uses a change in velocity, or uses an assistive device. Gait with Horizontal Head Turns: Performs head turns smoothly with slight change in gait velocity (eg, minor disruption to smooth gait path), deviates 6-10 in outside 12 in walkway width, or uses an assistive device. Gait with Vertical Head Turns: Performs task with slight change in gait velocity (eg, minor disruption to smooth gait path), deviates 6 - 10 in outside 12 in walkway width or uses assistive device Gait and Pivot Turn: Pivot turns safely in greater than 3 sec and stops with no loss of balance, or pivot turns safely within 3 sec and stops with mild imbalance, requires small steps to catch balance. Step Over Obstacle: Is able to step over one shoe box (4.5 in total height) without changing gait speed. No evidence of imbalance. Gait with Narrow Base of Support: Ambulates 4-7  steps. Gait with Eyes Closed: Walks 20 ft, slow speed, abnormal gait pattern, evidence for imbalance, deviates 10-15 in outside 12 in walkway width. Requires more than 9 sec to ambulate 20 ft. Ambulating Backwards: Walks 20 ft, uses assistive device, slower speed, mild gait deviations, deviates 6-10 in outside 12 in walkway width. Steps: Alternating feet, must use rail. Total Score: 17    Therapy/Group: Individual Therapy  Hailey Stormer P Simpson Paulos  PT, DPT, CSRS  09/10/2023, 1:21 PM
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
## 2023-09-10 NOTE — Progress Notes (Signed)
Occupational Therapy Session Note  Patient Details  Name: Mark Mack MRN: [id number] Date of Birth: 1956-09-04  Today's Date: 09/10/2023 OT Individual Time: 0830-0900 OT Individual Time Calculation (min): 30 min    Short Term Goals: Week 1:  OT Short Term Goal 1 (Week 1): STGs = LTGs  Skilled Therapeutic Interventions/Progress Updates:    Pt received sitting on bed with wife in the room. Pt on room air and stated he was feeling much stronger. He stated he washed up earlier, completed oral care.    Pt agreeable to engaging in activity tolerance activities.  Pt worked on BUE AROM and able to tolerate even with mild rib pain and standing squats.  Pt's breathing does increase but heart rate and oxygen sat rate WNL.    Pt then used RW to ambulate to tub room with supervision.  Pt practiced stepping in and out of bathtub with Mod Ind using wall for support. His wife has a BSC she can put in tub for him to sit on.   Pt then walked out of bathroom to sit in wc. Worked on UE exercise by self propelling wc with cue to use B hands to push wheels at same time to keep chair on straight path. Once at room, pt able to get up and walk back to bed without RW.     Pt in room with all needs met.  Therapy Documentation Precautions:  Precautions Precautions: Fall Precaution Comments: monitor Sp02 Restrictions Weight Bearing Restrictions: No   Pain: Pain Assessment Pain Score: 2  Pain Type: Acute pain Pain Location: Rib cage Pain Descriptors / Indicators: Aching Pain Onset: With Activity ADL: ADL ADL Comments: supervision with all ADL tasks with RW for support    Therapy/Group: Individual Therapy  Shanece Cochrane 09/10/2023, 12:19 PM

## 2023-09-10 NOTE — Progress Notes (Signed)
Patient ID: Mark Mack, male   DOB: 1955/12/28, 67 y.o.   MRN: [id number]  met with pt and wife who is here in the room to give team conference update regarding goals of mod/I level and target discharge date of 11/15. Both are in agreement with this and reports actually have a rollator at home not as big as the one here. Informed them the one he is using is a bariatric rollator and he would not get one like that if ordered for him. Both report they do have a smaller one at home and he will use that one. Discussed OPPT and SP recommended at discharge and can make referral to [facility] for this. Both were in agreement with this and will ask to contact wife to set up the follow up appointments. Referral sent to [facility]. Wife staying here until husband goes home on Friday.

## 2023-09-11 ENCOUNTER — Other Ambulatory Visit (HOSPITAL_COMMUNITY): Payer: Self-pay

## 2023-09-11 ENCOUNTER — Inpatient Hospital Stay (HOSPITAL_COMMUNITY): Payer: Medicare HMO

## 2023-09-11 DIAGNOSIS — G931 Anoxic brain damage, not elsewhere classified: Secondary | ICD-10-CM | POA: Diagnosis not present

## 2023-09-11 DIAGNOSIS — F09 Unspecified mental disorder due to known physiological condition: Secondary | ICD-10-CM

## 2023-09-11 LAB — CBC WITH DIFFERENTIAL/PLATELET
Abs Immature Granulocytes: 0.21 10*3/uL — ABNORMAL HIGH (ref 0.00–0.07)
Basophils Absolute: 0.1 10*3/uL (ref 0.0–0.1)
Basophils Relative: 1 %
Eosinophils Absolute: 0.1 10*3/uL (ref 0.0–0.5)
Eosinophils Relative: 1 %
HCT: 31.2 % — ABNORMAL LOW (ref 39.0–52.0)
Hemoglobin: 10 g/dL — ABNORMAL LOW (ref 13.0–17.0)
Immature Granulocytes: 1 %
Lymphocytes Relative: 9 %
Lymphs Abs: 1.8 10*3/uL (ref 0.7–4.0)
MCH: 27.7 pg (ref 26.0–34.0)
MCHC: 32.1 g/dL (ref 30.0–36.0)
MCV: 86.4 fL (ref 80.0–100.0)
Monocytes Absolute: 1.3 10*3/uL — ABNORMAL HIGH (ref 0.1–1.0)
Monocytes Relative: 7 %
Neutro Abs: 15.9 10*3/uL — ABNORMAL HIGH (ref 1.7–7.7)
Neutrophils Relative %: 81 %
Platelets: 639 10*3/uL — ABNORMAL HIGH (ref 150–400)
RBC: 3.61 MIL/uL — ABNORMAL LOW (ref 4.22–5.81)
RDW: 15.1 % (ref 11.5–15.5)
WBC: 19.4 10*3/uL — ABNORMAL HIGH (ref 4.0–10.5)
nRBC: 0 % (ref 0.0–0.2)

## 2023-09-11 MED ORDER — TRAZODONE HCL 50 MG PO TABS
50.0000 mg | ORAL_TABLET | Freq: Every day | ORAL | 0 refills | Status: AC
  Filled 2023-09-11: qty 30, 30d supply, fill #0

## 2023-09-11 MED ORDER — VITAMIN D3 25 MCG PO TABS
3000.0000 [IU] | ORAL_TABLET | Freq: Every day | ORAL | 0 refills | Status: AC
  Filled 2023-09-11: qty 90, 30d supply, fill #0

## 2023-09-11 MED ORDER — ASPIRIN 81 MG PO TBEC
81.0000 mg | DELAYED_RELEASE_TABLET | Freq: Every day | ORAL | 0 refills | Status: AC
  Filled 2023-09-11: qty 100, 100d supply, fill #0

## 2023-09-11 MED ORDER — MAGNESIUM HYDROXIDE 400 MG/5ML PO SUSP
30.0000 mL | Freq: Once | ORAL | Status: AC
  Administered 2023-09-11: 30 mL via ORAL
  Filled 2023-09-11: qty 30

## 2023-09-11 MED ORDER — SILVER SULFADIAZINE 1 % EX CREA
TOPICAL_CREAM | Freq: Two times a day (BID) | CUTANEOUS | 0 refills | Status: AC
  Filled 2023-09-11: qty 50, 30d supply, fill #0

## 2023-09-11 MED ORDER — THIAMINE HCL 100 MG PO TABS
100.0000 mg | ORAL_TABLET | Freq: Every day | ORAL | 0 refills | Status: AC
  Filled 2023-09-11: qty 30, 30d supply, fill #0

## 2023-09-11 MED ORDER — ACETAMINOPHEN 325 MG PO TABS: 325.0000 mg | ORAL_TABLET | ORAL | Status: AC | PRN

## 2023-09-11 MED ORDER — PANTOPRAZOLE SODIUM 40 MG PO TBEC
40.0000 mg | DELAYED_RELEASE_TABLET | Freq: Every day | ORAL | 0 refills | Status: AC
  Filled 2023-09-11: qty 30, 30d supply, fill #0

## 2023-09-11 MED ORDER — L-METHYLFOLATE-B6-B12 3-35-2 MG PO TABS
1.0000 | ORAL_TABLET | Freq: Every day | ORAL | 0 refills | Status: AC
  Filled 2023-09-11: qty 30, 30d supply, fill #0

## 2023-09-11 MED ORDER — MAGNESIUM OXIDE -MG SUPPLEMENT 400 (240 MG) MG PO TABS
200.0000 mg | ORAL_TABLET | Freq: Every day | ORAL | 0 refills | Status: AC
  Filled 2023-09-11: qty 30, 60d supply, fill #0

## 2023-09-11 MED ORDER — CLOPIDOGREL BISULFATE 75 MG PO TABS
75.0000 mg | ORAL_TABLET | Freq: Every day | ORAL | 0 refills | Status: AC
  Filled 2023-09-11: qty 30, 30d supply, fill #0

## 2023-09-11 MED ORDER — METOPROLOL SUCCINATE ER 25 MG PO TB24
12.5000 mg | ORAL_TABLET | Freq: Every day | ORAL | 0 refills | Status: AC
  Filled 2023-09-11: qty 30, 60d supply, fill #0

## 2023-09-11 MED ORDER — ATORVASTATIN CALCIUM 40 MG PO TABS
40.0000 mg | ORAL_TABLET | Freq: Every day | ORAL | 0 refills | Status: AC
  Filled 2023-09-11: qty 30, 30d supply, fill #0

## 2023-09-11 MED ORDER — VITAMIN D 25 MCG (1000 UNIT) PO TABS
3000.0000 [IU] | ORAL_TABLET | Freq: Every day | ORAL | Status: DC
  Administered 2023-09-12: 3000 [IU] via ORAL
  Filled 2023-09-11: qty 3

## 2023-09-11 MED ORDER — NITROGLYCERIN 0.4 MG SL SUBL
0.4000 mg | SUBLINGUAL_TABLET | SUBLINGUAL | 0 refills | Status: AC | PRN
  Filled 2023-09-11: qty 25, 8d supply, fill #0

## 2023-09-11 NOTE — Progress Notes (Signed)
Physical Therapy Session Note  Patient Details  Name: Mark Mack MRN: [id number] Date of Birth: 1955-11-25  Today's Date: 09/11/2023 PT Individual Time: 0803-0859 PT Individual Time Calculation (min): 56 min   Short Term Goals: Week 1:  PT Short Term Goal 1 (Week 1): STG = LTG due to ELOS  Skilled Therapeutic Interventions/Progress Updates:     Pt received supine in bed and agrees to therapy. No complaint of pain but does report fatigue and not having slept well previous night. Pt requires increased time for initiation due to fatigue. Supine to sit with bed features. Pt stands and ambulates x300' with rollator at [facility]). Pt completes Nustep for endurance training, with cues for hand and foot placement and completing full available ROM. Pt completes x10:00 at workload of 5 with average steps per minute ~60.   Pt then completes following PT explanation of test. Pt completes with rollator and achieves a score of 918', though ambulates a total of 1500' prior to taking seated rest break. Following bout of ambulation, pt rates perceived exertion at 6/10. PT provides cues for pursed lip breathing to optimize oxygen sats.   Following extended seated rest break, pt ambulates x300' back to room with rollator. Left seated at EOB with all needs within reach.    Therapy Documentation Precautions:  Precautions Precautions: Fall Precaution Comments: monitor Sp02 Restrictions Weight Bearing Restrictions: No   Therapy/Group: Individual Therapy  Beau Fanny, PT, DPT 09/11/2023, 4:07 PM

## 2023-09-11 NOTE — Progress Notes (Signed)
Inpatient Rehabilitation Care Coordinator Discharge Note   Patient Details  Name: Mark Mack MRN: [id number] Date of Birth: [birth year]   Discharge location: HOME WITH WIFE WHO HAS STAYED HERE WITH PT AND IS AWARE OF HIS CARE NEEDS.  Length of Stay: 4 days  Discharge activity level: MOD/I LEVEL  Home/community participation: ACTIVE  Patient response WU:JWJXBJ Literacy - How often do you need to have someone help you when you read instructions, pamphlets, or other written material from your doctor or pharmacy?: Never  Patient response YN:WGNFAO Isolation - How often do you feel lonely or isolated from those around you?: Never  Services provided included: RD, MD, PT, OT, SLP, RN, CM, TR, Pharmacy, Neuropsych, SW  Financial Services:  Field seismologist Utilized: Private Insurance [employer] MEDICARE  Choices offered to/list presented to: PT AND WIFE  Follow-up services arranged:  Outpatient, Patient/Family has no preference for HH/DME agencies    Outpatient Servicies: [facility]PAT[facility]  PT  & SP WILL CALL WIFE TO SET UP FOLLOW UP APPOINTMENTS  HAS EQUIPMENT FROM PAST SURGERIES.    Patient response to transportation need: Is the patient able to respond to transportation needs?: Yes In the past 12 months, has lack of transportation kept you from medical appointments or from getting medications?: No In the past 12 months, has lack of transportation kept you from meetings, work, or from getting things needed for daily living?: No   Patient/Family verbalized understanding of follow-up arrangements:  Yes  Individual responsible for coordination of the follow-up plan: SELF AND Mark Mack-WIFE [phone]  Confirmed correct DME delivered: Mark Mack 09/11/2023    Comments (or additional information):WIFE STAYED HERE TO PROVIDE SUPPORT TO HUSBAND AND SAW HIM IN THERAPIES. PT DID WELL AND RECOVERED QUICKLY.  Summary of Stay    Date/Time Discharge Planning CSW  09/10/23 0831 Home  with wife who is still recovering from her hip replacement surgery. Has daughter and grandson who also live with them. Neuro-psych to see this week RGD       Mark Mack, Mark Mack

## 2023-09-11 NOTE — Plan of Care (Deleted)
  Problem: RH Problem Solving Goal: LTG Patient will demonstrate problem solving for (SLP) Description: LTG:  Patient will demonstrate problem solving for basic/complex daily situations with cues  (SLP) Outcome: Shortened length of stay   Problem: RH Memory Goal: LTG Patient will demonstrate ability for day to day (SLP) Description: LTG:   Patient will demonstrate ability for day to day recall/carryover during cognitive/linguistic activities with assist  (SLP) Outcome: Shortened length of stay   Problem: RH Awareness Goal: LTG: Patient will demonstrate awareness during functional activites type of (SLP) Description: LTG: Patient will demonstrate awareness during functional activites type of (SLP) Outcome: Completed/Met   Problem: RH Swallowing Goal: LTG Patient will consume least restrictive diet using compensatory strategies with assistance (SLP) Description: LTG:  Patient will consume least restrictive diet using compensatory strategies with assistance (SLP) Outcome: Completed/Met

## 2023-09-11 NOTE — Plan of Care (Signed)
  Problem: RH Awareness Goal: LTG: Patient will demonstrate awareness during functional activites type of (SLP) Description: LTG: Patient will demonstrate awareness during functional activites type of (SLP) Outcome: Completed/Met   Problem: RH Swallowing Goal: LTG Patient will consume least restrictive diet using compensatory strategies with assistance (SLP) Description: LTG:  Patient will consume least restrictive diet using compensatory strategies with assistance (SLP) Outcome: Completed/Met   Problem: RH Problem Solving Goal: LTG Patient will demonstrate problem solving for (SLP) Description: LTG:  Patient will demonstrate problem solving for basic/complex daily situations with cues  (SLP) 09/11/2023 1548 by Raynell Upton F, CCC-SLP Outcome: Not Met (shortened length of stay)   Problem: RH Memory Goal: LTG Patient will demonstrate ability for day to day (SLP) Description: LTG:   Patient will demonstrate ability for day to day recall/carryover during cognitive/linguistic activities with assist  (SLP) 09/11/2023 1554 by Shandrea Lusk F, CCC-SLP Outcome: Not Met (shortened length of stay)

## 2023-09-11 NOTE — Plan of Care (Signed)

## 2023-09-11 NOTE — Progress Notes (Signed)
PROGRESS NOTE   Subjective/Complaints: No new complaints this morning Discussed elevated WBC, consultation of ID, improved CXR earlier this week Seen ambulating w/ RW S  ROS: denies constipation, denies pain, +shortness of breath only with exertion   Objective:   No results found. Recent Labs    09/09/23 2105 09/10/23 1109  WBC 22.8* 21.2*  HGB 9.5* 9.9*  HCT 29.0* 31.0*  PLT 440* 545*   Recent Labs    09/09/23 2105  NA 136  K 3.8  CL 110  CO2 19*  GLUCOSE 148*  BUN 17  CREATININE 1.15  CALCIUM 8.1*    Intake/Output Summary (Last 24 hours) at 09/11/2023 0956 Last data filed at 09/11/2023 0719 Gross per 24 hour  Intake 716 ml  Output --  Net 716 ml        Physical Exam: Vital Signs Blood pressure 131/77, pulse 73, temperature 98.6 F (37 C), temperature source Oral, resp. rate 17, height 5\' 6"  (1.676 m), weight 63.1 kg, SpO2 98%. Gen: no distress, normal appearing HEENT: oral mucosa pink and moist, NCAT Cardio: Reg rate  Effort: Pulmonary effort is normal. No respiratory distress.     Breath sounds:Breathing comfortably on room air    Comments: Off O2 Abdominal:     General: There is distension.     Tenderness: There is no abdominal tenderness.  Musculoskeletal:        General: No swelling, tenderness, deformity or signs of injury.     Cervical back: Normal range of motion.  Skin:    General: Skin is warm and dry.     Comments: Erythema right forearm around prior OS site. Granulation tissue on sternum  Neurological:     Mental Status: He is alert and oriented to person, place, and time.     Comments: Slow speech. Able to answer orientation questions and follow simple motor commands. Fair insight and awareness. No focal CN signs. Normal language and speech. MMT: UE grossly 4/5 prox til 4+/5 distally. LE: 4/5 prox to distal bilaterally. Good sitting balance. No sensory abnl appreciated. DTR's 1+   Psychiatric:     Comments: Friendly and appropriate     Assessment/Plan: 1. Functional deficits which require 3+ hours per day of interdisciplinary therapy in a comprehensive inpatient rehab setting. Physiatrist is providing close team supervision and 24 hour management of active medical problems listed below. Physiatrist and rehab team continue to assess barriers to discharge/monitor patient progress toward functional and medical goals  Care Tool:  Bathing    Body parts bathed by patient: Right arm, Left arm, Chest, Abdomen, Front perineal area, Buttocks, Right upper leg, Face, Left lower leg, Right lower leg, Left upper leg         Bathing assist Assist Level: Supervision/Verbal cueing     Upper Body Dressing/Undressing Upper body dressing   What is the patient wearing?: Pull over shirt    Upper body assist Assist Level: Supervision/Verbal cueing    Lower Body Dressing/Undressing Lower body dressing      What is the patient wearing?: Underwear/pull up, Pants     Lower body assist Assist for lower body dressing: Supervision/Verbal cueing     Toileting  Toileting    Toileting assist Assist for toileting: Supervision/Verbal cueing     Transfers Chair/bed transfer  Transfers assist     Chair/bed transfer assist level: Independent with assistive device Chair/bed transfer assistive device: Armrests, Geologist, engineering   Ambulation assist      Assist level: Independent with assistive device Assistive device: Rollator Max distance: 400'   Walk 10 feet activity   Assist     Assist level: Independent with assistive device Assistive device: Rollator   Walk 50 feet activity   Assist    Assist level: Independent with assistive device Assistive device: Rollator    Walk 150 feet activity   Assist    Assist level: Independent with assistive device Assistive device: Rollator    Walk 10 feet on uneven surface   activity   Assist     Assist level: Independent with assistive device Assistive device: Rollator   Wheelchair     Assist Is the patient using a wheelchair?: No Type of Wheelchair: Manual    Wheelchair assist level: Supervision/Verbal cueing Max wheelchair distance: 23'    Wheelchair 50 feet with 2 turns activity    Assist    Wheelchair 50 feet with 2 turns activity did not occur:  (fatigue)   Assist Level: Maximal Assistance - Patient 25 - 49%   Wheelchair 150 feet activity     Assist      Assist Level: Maximal Assistance - Patient 25 - 49%   Blood pressure 131/77, pulse 73, temperature 98.6 F (37 C), temperature source Oral, resp. rate 17, height 5\' 6"  (1.676 m), weight 63.1 kg, SpO2 98%.  Medical Problem List and Plan: 1. 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  Antithrombotics: -DVT/anticoagulation:  Mechanical: Sequential compression devices, below knee Bilateral lower extremities             -antiplatelet therapy: DAPT 3. Pain Management: Tylenol prn.  4. Mood/Behavior/Sleep: LCSW to follow for evaluation and  support.              --continue trazodone for sleep wake disruption.              -antipsychotic agents: Seroquel for delirium/sleep.  5. Neuropsych/cognition: This patient is not fully capable of making decisions on his own behalf. 6. Skin/Wound Care: routine pressure relief measures.              --Silvadene added to area on mid sternum.     7. Fluids/Electrolytes/Nutrition: Monitor I/O. Check CMET ina m.  8. STEMI/hx of CABG  : Jonne Ply resumed on 11/10 and Plavix resumed today.  --monitor for recurrent signs of bleeding.              --add low salt restrictions. Monitor for signs of overload             --on Metoprolol, losartan, DAPT, Litpitor,   9. Aspiration PNA: Treated with IV Zosyn X 7 days thru 11/09 10. Leucocytosis: Has had rise in WBC to 21.7-->monitor for fevers and other signs of infection             --Encourage pulmonary hygiene. Monitor for symptoms of pancreatitis.   11. ABLA: On DAPT since again since 11/10  and HH stable around 9.8.             --hemoptysis ongoing per patient --continue to monitor for trend with  plavix resumed on 11/11    12. Acute renal failure: Due to cardiogenic shock and resolving. Cr reviewed and has normalized  13. Abnormal LFTs: Improving             -Continue to educate on cessation of ETOH/Drug use  14. Pancreatitis: Monitor for symptoms--amylase/lipase  739/149 @ admission --educate on importance of cessation of polysubstance use.  -drug abuse counseling as outpt  15. Thrombocytopenia: Platelets reviewed and normalized.   16. HTN: normalized, start magnesium gluconate 250mg  HS, d/c losartan  17. Vitamin D insufficiency: increase daily D3 to 3,000U  18. Dizziness: d/c losartan, BP reviewed and remains stable  19. Alcohol use: educated regarding potential B vitamin and magnesium deficiencies with alcohol use  20. Leukocytosis: CXR ordered and consulting ID  21. Exertional dyspnea: messaged Erica to please bring incentive spirometer for him    LOS: 3 days A FACE TO FACE EVALUATION WAS PERFORMED  Drema Pry Chloe Bluett 09/11/2023, 9:56 AM

## 2023-09-11 NOTE — Consult Note (Signed)
Neuropsychological Consultation Comprehensive Inpatient Rehab   Patient:   Mark Mack   DOB:   1956-02-12  MR Number:  [id number]  Location:  [facility] [address] [place] [address] Dept: [phone] Loc: [phone]           Date of Service:   09/10/2023  Start Time:   2 PM End Time:   3 PM  Provider/Observer:  Arley Phenix, Psy.D.       Clinical Neuropsychologist       Billing Code/Service: [phone]  Reason for Service:    Mark Mack is a 67 year old male referred for neuropsychological consultation due to coping and adjustment issues and cognitive changes after recent cardiovascular event with concern for anoxic brain injury.  Patient has a past medical history including CAD with CABG 2019, hypertension, ethanol abuse, tobacco use.  Patient was admitted to [facility] on 08/31/2023 with witnessed cardiac arrest and multiple rounds of CPR.  Of note, the patient did have a positive urine drug screen for THC, cocaine and benzodiazepine.  Patient was found to have a STEMI and taken to Cath Lab for intervention.  EEG was performed during comatose state and did show moderate diffuse encephalopathy.  Patient was able to tolerate extubation on 11/6 with encephalopathy improving and no MRI conducted.  During today's visit, the patient was sitting up on the edge of his bed with wife in the room.  Patient had a positive affect and was extremely agreeable and engaging throughout visit.  Patient readily admitted substance abuse/use but reported that this was not a regular pattern for him although he did admit to using cocaine and other substances at various times when people would offer it to him.  Patient's wife confirmed this history.  Patient was oriented but showed some level of concreteness in his thinking but was able to describe events of the day and previous couple of days.  Patient appears  to have improved significantly from his encephalopathic state during acute phases of his cardiovascular event.  HPI for the current admission:    HPI:  Mark Mack is a 67 year old male with history of CAD s/p CABG 2019, HTN, ETOH abuse, tobacco use who was admitted to [facility] on 08/31/23 with witnessed cardiac arrest with multiple rounds of CPR with ROSC.  UDS positive for THC, cocaine and benzo. He was found to have inferior STEMI and taken to cath lab for [facility] showing occulusion in ostial circumflex and proximal and mid right coronaries which were unable to be crossed per Dr. Juliann Pares. He was started on IV heparin but developed alveolar hemorrhage requiring bronch with therapeutic aspiration of blood from right and left main bronchi felt to be due to lung contusion from CPR/Pulmonary edema and required bivalirudin as well as stress dose steroids. CTA chest was negative for PE but showed consolidation of BLL, central and upper lung secondary to aspiration and pulmonary edema as well as concerns of hemorrhagic pancreatitis. [name] He was started on IV Zosyn, milrionine and required multiple pressors. Plavix/heparin held due to bleed and abnormal LFTs due to shocked liver being monitored.    EEG done due to comatose state and showed moderate diffuse encephalopathy. He tolerated extubation  to [facility] 11/06 and post arrest encephalopathy improving therefore no need for MRI brain per Dr. Amada Jupiter. 2 D echo showed EF 60-65% with mild to moderate PVR. CT chest/abdomen/pelvis done 11/05 showed persistent peripancreatic stranding  c/w acute pancreatitis, improvement in aspiration and signs of acute tubular necrosis.  Swallow evaluation showed concerns of aspiration and increased WOB with intake therefore diet slowly initiated to D3, thins and no straws due to aspiration risk.  Electrolyte abnormalities with AKI resolving and hypokalemia supplemented intermittently. He was trated with CIWA protocol and delirium has resolved.    Medical History:   Past Medical History:  Diagnosis Date   CAD (coronary artery disease)    Hypertension    OA (osteoarthritis) of hip    bilateral hips   Tobacco use          Patient Active Problem List   Diagnosis Date Noted   Cognitive and neurobehavioral dysfunction 09/11/2023   Acute metabolic encephalopathy 09/08/2023   Acute respiratory failure with hypoxia and hypercapnia (HCC) 09/08/2023   AKI (acute kidney injury) (HCC) 09/08/2023   Chronic kidney disease, stage 3a (HCC) 09/08/2023   Aspiration pneumonia (HCC) 09/08/2023   Polysubstance abuse (HCC) 09/08/2023   Anoxic brain injury (HCC) 09/08/2023   Cardiac arrest (HCC) 08/31/2023   STEMI (ST elevation myocardial infarction) (HCC) 08/31/2023   Chest pain 04/03/2018   Coronary artery disease of native artery of native heart with stable angina pectoris (HCC) 02/14/2018   History of total right hip replacement 10/02/2012   Primary hypertension 12/06/2008   HLA B27 positive 09/21/2008   Hypercholesterolemia 04/17/2005   Arthropathy of pelvic region and thigh 09/15/2000   Esophageal reflux 09/15/2000    Behavioral Observation/Mental Status:   Mark Mack  presents as a 67 y.o.-year-old Right handed African American Male who appeared his stated age. his dress was Appropriate and he was Well Groomed and his manners were Appropriate to the situation.  his participation was indicative of Appropriate and Redirectable behaviors.  There were physical disabilities noted.  he displayed an appropriate level of cooperation and motivation.    Interactions:    Active Appropriate  Attention:   abnormal and attention span and concentration were age appropriate  Memory:   within normal limits; recent and remote memory intact  Visuo-spatial:   within normal limits  Speech (Volume):  normal  Speech:   normal; normal  Thought Process:  Coherent and Relevant  Coherent and Oriented  Though Content:  WNL; not suicidal and not  homicidal  Orientation:   person, place, time/date, and situation  Judgment:   Fair  Planning:   Fair  Affect:    Appropriate  Mood:    Euthymic  Insight:   Fair  Intelligence:   normal  Psychiatric History:  No prior psychiatric history noted in medical records but patient does admit to at least episodic use of a number of different substances.  History of Substance Use or Abuse:  There is a documented history of cocaine and marijuana abuse confirmed by the patient.     Impression/DX:   Mark Mack is a 67 year old male referred for neuropsychological consultation due to coping and adjustment issues and cognitive changes after recent cardiovascular event with concern for anoxic brain injury.  Patient has a past medical history including CAD with CABG 2019, hypertension, ethanol abuse, tobacco use.  Patient was admitted to [facility] on 08/31/2023 with witnessed cardiac arrest and multiple rounds of CPR.  Of note, the patient did have a positive urine drug screen for THC, cocaine and benzodiazepine.  Patient was found to have a STEMI and taken to Cath Lab for intervention.  EEG was performed during comatose state and did show moderate diffuse  encephalopathy.  Patient was able to tolerate extubation on 11/6 with encephalopathy improving and no MRI conducted.  During today's visit, the patient was sitting up on the edge of his bed with wife in the room.  Patient had a positive affect and was extremely agreeable and engaging throughout visit.  Patient readily admitted substance abuse/use but reported that this was not a regular pattern for him although he did admit to using cocaine and other substances at various times when people would offer it to him.  Patient's wife confirmed this history.  Patient was oriented but showed some level of concreteness in his thinking but was able to describe events of the day and previous couple of days.  Patient appears to have improved  significantly from his encephalopathic state during acute phases of his cardiovascular event.  Disposition/Plan:  Today we worked on coping and adjustment issues along with assessing his baseline cognitive functioning.  The patient was oriented and appears to have improved significantly from a cognitive standpoint and patient's wife notes that he appears to be broaching his baseline cognitively.  Patient has improved significantly regarding the cognitive and neurobehavioral deficits noted earlier in hospital course.          Electronically Signed   _______________________ Arley Phenix, Psy.D. Clinical Neuropsychologist

## 2023-09-11 NOTE — Progress Notes (Signed)
Physical Therapy Discharge Summary  Patient Details  Name: Mark Mack MRN: [id number] Date of Birth: July 12, 1956  Date of Discharge from PT service:September 11, 2023  Patient has met 8 of 8 long term goals due to improved activity tolerance, improved balance, increased strength, ability to compensate for deficits, and improved awareness.  Patient to discharge at an ambulatory level Modified Independent.   Patient's care partner is independent to provide the necessary physical and cognitive assistance at discharge.  Reasons goals not met: n/a  Functional Outcome Measures completed:  AT EVALUATION: 334ft on EVAL with 2L O2 and RW  5xSTS 10.8 seconds on eval  Gait speed 0.27 m/s on eval    AT DISCHARGE: = 1,041 ft on room air with rollator BERG 48/56 FGA 17/30 5xSTS 6.5 seconds TUG 12 seconds Gait speed 0.88 m/s   Recommendation:  Patient will benefit from ongoing skilled PT services in outpatient setting to continue to advance safe functional mobility, address ongoing impairments in dynamic standing balance, dynamic gait, generalized weakness and deconditioning, and minimize fall risk.  Equipment: Rollator  Reasons for discharge: treatment goals met and discharge from hospital  Patient/family agrees with progress made and goals achieved: Yes  PT Discharge Precautions/Restrictions Precautions Precautions: Fall Restrictions Weight Bearing Restrictions: No Pain Pain Assessment Pain Scale: 0-10 Pain Score: 0-No pain Pain Interference Pain Interference Pain Effect on Sleep: 0. Does not apply - I have not had any pain or hurting in the past 5 days Pain Interference with Therapy Activities: 0. Does not apply - I have not received rehabilitationtherapy in the past 5 days Pain Interference with Day-to-Day Activities: 1. Rarely or not at all Vision/Perception  Vision - History Ability to See in Adequate Light: 0 Adequate Perception Perception: Within Functional  Limits Praxis Praxis: WFL  Cognition Overall Cognitive Status: Within Functional Limits for tasks assessed Arousal/Alertness: Awake/alert Orientation Level: Oriented X4 Safety/Judgment: Appears intact Sensation Sensation Light Touch: Appears Intact Hot/Cold: Appears Intact Proprioception: Appears Intact Stereognosis: Appears Intact Coordination Gross Motor Movements are Fluid and Coordinated: Yes Fine Motor Movements are Fluid and Coordinated: Yes Motor  Motor Motor: Other (comment) Motor - Skilled Clinical Observations: generalized weakness and deconditioning  Mobility Bed Mobility Bed Mobility: Supine to Sit;Sit to Supine Supine to Sit: Independent Sit to Supine: Independent Transfers Transfers: Sit to Stand;Stand to [employer] Transfers Sit to Stand: Independent Stand to Sit: Independent Stand Pivot Transfers: Independent Transfer (Assistive device): None Locomotion  Gait Ambulation: Yes Gait Assistance: Independent with assistive device Gait Distance (Feet): 400 Feet Assistive device: Rollator Gait Gait: Yes Gait Pattern: Within Functional Limits Stairs / Additional Locomotion Stairs: Yes Stairs Assistance: Independent with assistive device Stair Management Technique: Two rails;Forwards;Alternating pattern Number of Stairs: 12 Height of Stairs: 6 Ramp: Independent with assistive device Pick up small object from the floor assist level: Independent with assistive device Wheelchair Mobility Wheelchair Mobility: No  Trunk/Postural Assessment  Cervical Assessment Cervical Assessment: Within Functional Limits Thoracic Assessment Thoracic Assessment: Within Functional Limits Lumbar Assessment Lumbar Assessment: Within Functional Limits Postural Control Postural Control: Within Functional Limits  Balance Balance Balance Assessed: Yes Static Sitting Balance Static Sitting - Balance Support: Feet supported;No upper extremity supported Static Sitting -  Level of Assistance: 7: Independent Dynamic Sitting Balance Dynamic Sitting - Balance Support: During functional activity;Feet supported;No upper extremity supported Dynamic Sitting - Level of Assistance: 7: Independent Static Standing Balance Static Standing - Balance Support: No upper extremity supported Static Standing - Level of Assistance: 7: Independent Dynamic Standing  Balance Dynamic Standing - Balance Support: Bilateral upper extremity supported;During functional activity Dynamic Standing - Level of Assistance: 6: Modified independent (Device/Increase time) Extremity Assessment      RLE Assessment RLE Assessment: Within Functional Limits LLE Assessment LLE Assessment: Within Functional Limits   Mark Mack [name]  PT, DPT, CSRS  09/11/2023, 10:15 AM

## 2023-09-11 NOTE — Progress Notes (Signed)
Speech Language Pathology Discharge Summary  Patient Details  Name: Mark Mack MRN: [id number] Date of Birth: 20-Sep-1956  Date of Discharge from SLP service:September 11, 2023  Today's Date: 09/11/2023 SLP Individual Time: 1300-1400 SLP Individual Time Calculation (min): 60 min   Skilled Therapeutic Interventions:   Skilled therapy session focused on cognitive goals. SLP facilitated session by reviewing memory strategies (WRAP: write, repeat, associate, picture) as patient unable to recall from prior session. SLP addressed problem solving through completion of medication management task. Patient required supervisionA to utilize verbal and written prescription instructions to place "pills" in BID pill box. Patient with x1 mistake, however able to self correct given cue to review accuracy. SLP encouraged use of pill box at d/c to aid in organization. SLP answered all questions regarding goals and d/c and patient/family verbalized understanding. Patient left in chair with alarm set and call bell in reach.    Patient has met 2 of 4 long term goals.  Patient to discharge at overall Modified Independent;Min level.  Reasons goals not met: continues to require cues; shortened length of stay (SLP only seen twice)  Clinical Impression/Discharge Summary:  Pt has made good gains and has met 2 of 4 LTG's this admission due to improved awareness and dysphagia. Pt is currently an overall supervision-minA for cognitive tasks. Patient with shortened length of stay, therefore not meeting memory/problem solving goals.  Patient continues to require modiA cues for utilization of swallowing compensatory strategies to minimize overt s/sx of aspiration with D3/thin diet. Pt/family education complete and pt will discharge home with 24 hour supervision from friends/family/etc. Pt would benefit from OP f/u ST services to maximize cognition in order to maximize functional independence.   Care Partner:  Caregiver Able to  Provide Assistance: Yes  Type of Caregiver Assistance: Physical;Cognitive  Recommendation:  Outpatient SLP;24 hour supervision/assistance  Rationale for SLP Follow Up: Maximize cognitive function and independence   Equipment: n/a   Reasons for discharge: Discharged from hospital   Patient/Family Agrees with Progress Made and Goals Achieved: Yes    Uzair Godley M.A., CF-SLP 09/11/2023, 1:45 PM

## 2023-09-11 NOTE — Progress Notes (Signed)
Occupational Therapy Session Note  Patient Details  Name: Zephyrus Marcinek MRN: [id number] Date of Birth: [birth year]  Today's Date: 09/11/2023 OT Individual Time: 4098-1191 OT Individual Time Calculation (min): 30 min    Short Term Goals: Week 1:  OT Short Term Goal 1 (Week 1): STGs = LTGs  Skilled Therapeutic Interventions/Progress Updates:    Pt resting in bed upon arrival with wife present. Pt mod I in room per PT. Pt's wife reported pt took shower and got dressed without any assistance this morning. D/c home tomorrow. Pt owns BSC. Reviewed home safety recommendations. Pt and wife verbalized understanding. Pt remained in bed with all needs wihtin reach.  Therapy Documentation Precautions:  Precautions Precautions: Fall Precaution Comments: monitor Sp02 Restrictions Weight Bearing Restrictions: No Pain: Pt denies pain Therapy/Group: Individual Therapy  Rich Brave 09/11/2023, 11:25 AM

## 2023-09-11 NOTE — IPOC Note (Signed)
Overall Plan of Care [facility]) Patient Details Name: Mark Mack MRN: [id number] DOB: 12-Jan-1956  Admitting Diagnosis: Anoxic brain injury [facility])  Hospital Problems: Principal Problem:   Anoxic brain injury (HCC) Active Problems:   Cognitive and neurobehavioral dysfunction     Functional Problem List: Nursing Safety, Endurance, Medication Management  PT Balance, Endurance, Motor  OT Balance, Cognition, Endurance, Motor, Pain  SLP Cognition, Nutrition, Endurance  TR         Basic ADL's: OT Grooming, Bathing, Dressing, Toileting     Advanced  ADL's: OT       Transfers: PT Bed Mobility, Bed to Chair, Customer service manager, Tub/Shower     Locomotion: PT Ambulation, Stairs     Additional Impairments: OT None  SLP Swallowing, Social Cognition   Problem Solving, Memory, Attention, Awareness  TR      Anticipated Outcomes Item Anticipated Outcome  Self Feeding independent  Swallowing  mod I   Basic self-care  Mod I  Toileting  Mod I   Bathroom Transfers Mod I  Bowel/Bladder  n/a  Transfers  mod I  Locomotion  mod I  Communication     Cognition  sup A  Pain  n/a  Safety/Judgment  manage w cues   Therapy Plan: PT Intensity: Minimum of 1-2 x/day ,45 to 90 minutes PT Frequency: 5 out of 7 days PT Duration Estimated Length of Stay: 4-6 days OT Intensity: Minimum of 1-2 x/day, 45 to 90 minutes OT Frequency: 5 out of 7 days OT Duration/Estimated Length of Stay: 5-7 days SLP Intensity: Minumum of 1-2 x/day, 30 to 90 minutes SLP Frequency: 3 to 5 out of 7 days SLP Duration/Estimated Length of Stay: 5-7 days   Team Interventions: Nursing Interventions Medication Management, Discharge Planning, Disease Management/Prevention, Patient/Family Education, Dysphagia/Aspiration Precaution Training  PT interventions Ambulation/gait training, Cognitive remediation/compensation, Discharge planning, DME/adaptive equipment instruction, Pain management, Functional mobility  training, Psychosocial support, Therapeutic Activities, UE/LE Strength taining/ROM, Wheelchair propulsion/positioning, UE/LE Coordination activities, Therapeutic Exercise, Stair training, Patient/family education, Neuromuscular re-education, Functional electrical stimulation, Disease management/prevention, Firefighter, Warden/ranger  OT Interventions Warden/ranger, Cognitive remediation/compensation, DME/adaptive equipment instruction, Discharge planning, Functional mobility training, Patient/family education, Self Care/advanced ADL retraining, UE/LE Strength taining/ROM, Therapeutic Exercise, Therapeutic Activities, Psychosocial support, Pain management  SLP Interventions Cognitive remediation/compensation, Dysphagia/aspiration precaution training, Internal/external aids, Speech/Language facilitation, Environmental controls, Therapeutic Activities, Patient/family education, Functional tasks  TR Interventions    SW/CM Interventions Discharge Planning, Psychosocial Support, Patient/Family Education   Barriers to Discharge MD  Medical stability  Nursing Decreased caregiver support, Home environment access/layout 1 level 2 ste no rails;wife works, dtr works from home and grandson can help  PT New oxygen, Medication compliance    OT      SLP Nutrition means poor appetite  SW Community education officer for [employer] coverage     Team Discharge Planning: Destination: PT-Home ,OT- Home , SLP-Home Projected Follow-up: PT-Outpatient PT, OT-  Outpatient OT, SLP-Outpatient SLP, 24 hour supervision/assistance Projected Equipment Needs: PT-To be determined, OT- Tub/shower seat, SLP-None recommended by SLP Equipment Details: PT- , OT-  Patient/family involved in discharge planning: PT- Patient, Family member/caregiver,  OT-Patient, Family member/caregiver, SLP-Patient, Family member/caregiver  MD ELOS: 4 days Medical Rehab Prognosis:  Excellent Assessment: The patient has been admitted  for CIR therapies with the diagnosis of anoxic brain injury. The team will be addressing functional mobility, strength, stamina, balance, safety, adaptive techniques and equipment, self-care, bowel and bladder mgt, patient and caregiver education. Goals have been set at supervision. Anticipated discharge  destination is home.        See Team Conference Notes for weekly updates to the plan of care

## 2023-09-11 NOTE — Progress Notes (Signed)
Occupational Therapy Discharge Summary  Patient Details  Name: Mark Mack MRN: [id number] Date of Birth: Apr 08, 1956  Date of Discharge from OT service:September 11, 2023   Patient has met 11 of 11 long term goals due to improved activity tolerance, improved balance, and postural control. Pt made excellent progress with BADLs and functional transfers during this admission. Pt is mod I for BADLs and tranfsers. Pt discharging home with wife and 24 hour supervision. Pt's wife has been present during therapy. Patient to discharge at overall Modified Independent level.  Patient's care partner is independent to provide the necessary physical assistance at discharge.    Reasons goals not met: n/a  Recommendation:  No f/u at this time  Equipment: No equipment provided  Reasons for discharge: treatment goals met and discharge from hospital  Patient/family agrees with progress made and goals achieved: Yes  OT Discharge ADL ADL Eating: Independent Grooming: Independent Upper Body Bathing: Modified independent Where Assessed-Upper Body Bathing: Shower Lower Body Bathing: Modified independent Where Assessed-Lower Body Bathing: Shower Upper Body Dressing: Independent Lower Body Dressing: Modified independent Toileting: Modified independent Where Assessed-Toileting: Neurosurgeon Method: Proofreader: Bedside commode, Grab bars Tub/Shower Transfer: Close supervison Web designer Method: Ship broker: Information systems manager with back Film/video editor: Modified independent Film/video editor Method: Designer, industrial/product: Information systems manager with back ADL Comments: supervision with all ADL tasks with RW for support Vision Baseline Vision/History: 0 No visual deficits Patient Visual Report: No change from baseline Vision Assessment?: No apparent visual deficits Perception  Perception:  Within Functional Limits Praxis Praxis: WFL Cognition Cognition Overall Cognitive Status: Within Functional Limits for tasks assessed Arousal/Alertness: Awake/alert Orientation Level: Person;Place;Situation Person: Oriented Place: Oriented Situation: Oriented Memory: Impaired Memory Impairment: Decreased recall of new information;Decreased short term memory Decreased Short Term Memory: Verbal basic;Functional basic Attention: Sustained Sustained Attention: Appears intact Awareness: Impaired Awareness Impairment: Intellectual impairment Safety/Judgment: Appears intact Brief Interview for Mental Status (BIMS) Repetition of Three Words (First Attempt): 3 Temporal Orientation: Year: Correct Temporal Orientation: Month: Accurate within 5 days Temporal Orientation: Day: Correct Recall: "Sock": Yes, no cue required Recall: "Blue": Yes, no cue required Recall: "Bed": Yes, no cue required BIMS Summary Score: 15 Sensation Sensation Light Touch: Appears Intact Hot/Cold: Appears Intact Proprioception: Appears Intact Stereognosis: Appears Intact Coordination Gross Motor Movements are Fluid and Coordinated: Yes Fine Motor Movements are Fluid and Coordinated: Yes Motor  Motor Motor: Other (comment) Motor - Skilled Clinical Observations: generalized weakness and deconditioning Mobility  Bed Mobility Bed Mobility: Supine to Sit;Sit to Supine Supine to Sit: Independent Sit to Supine: Independent Transfers Sit to Stand: Independent Stand to Sit: Independent  Trunk/Postural Assessment  Cervical Assessment Cervical Assessment: Within Functional Limits Thoracic Assessment Thoracic Assessment: Within Functional Limits Lumbar Assessment Lumbar Assessment: Within Functional Limits Postural Control Postural Control: Within Functional Limits  Balance Balance Balance Assessed: Yes Static Sitting Balance Static Sitting - Balance Support: Feet supported Static Sitting - Level of  Assistance: 7: Independent Dynamic Sitting Balance Dynamic Sitting - Balance Support: During functional activity;Feet supported;No upper extremity supported Dynamic Sitting - Level of Assistance: 7: Independent Static Standing Balance Static Standing - Balance Support: No upper extremity supported Static Standing - Level of Assistance: 7: Independent Dynamic Standing Balance Dynamic Standing - Balance Support: Bilateral upper extremity supported;During functional activity Dynamic Standing - Level of Assistance: 6: Modified independent (Device/Increase time) Extremity/Trunk Assessment RUE Assessment RUE Assessment: Within Functional Limits Active Range of Motion (AROM) Comments: [facility]  General Strength Comments: WFL LUE Assessment LUE Assessment: Within Functional Limits General Strength Comments: WFL   Rich Brave 09/11/2023, 11:32 AM

## 2023-09-11 NOTE — Progress Notes (Signed)
Physical Therapy Session Note  Patient Details  Name: Sina Sloane MRN: [id number] Date of Birth: 07/23/1956  Today's Date: 09/11/2023 PT Individual Time: 7846-9629 PT Individual Time Calculation (min): 60 min   Short Term Goals: Week 1:  PT Short Term Goal 1 (Week 1): STG = LTG due to ELOS  Skilled Therapeutic Interventions/Progress Updates:      Pt presents sitting EOB to start treatment - wife present at bedside. Discussed general DC planning, home safety, and follow up recommendations. Family and patient without concerns and feel confident re: DC plan.   Sit<>Stand mod I to rollator. Ambulates mod I with rollator throughout session.   Completed using rollator - covered 1,041 ft in the 6 minutes. Gait speed 0.58m/s. This is significant improvement from when he was evaluated when he covered 318ft with a gait speed of 0.52m/s. He also was on 2L O2 on evaluation but now on room air.   5xSTS - 6.5 seconds *Scores > 13.5 seconds indicate increased falls risk  TUG - 12 seconds *Scores > 15 seconds indicate increased falls risk  Pt instructed in therapeutic activity challenging balance and endurance - playing games of corn hole toss, horse shoe toss, and wii golf - all completed in unsupported standing at mod I level - no LOB observed and had brief sitting rest breaks b/w games.   Ambulated back to his room and ended treatment sitting EOB with all needs met. Provided him with mod I sign outside door and made nursing aware. All needs met.    Therapy Documentation Precautions:  Precautions Precautions: Fall Precaution Comments: monitor Sp02 Restrictions Weight Bearing Restrictions: No General:      Therapy/Group: Individual Therapy  Orrin Brigham 09/11/2023, 7:46 AM

## 2023-09-11 NOTE — Discharge Summary (Signed)
Physician Discharge Summary  Patient ID: Mark Mack MRN: [id number] DOB/AGE: [birth year] 67 y.o.  Admit date: 09/08/2023 Discharge date: 09/12/2023  Discharge Diagnoses:  Principal Problem:   Anoxic brain injury [facility]) Active Problems:   STEMI (ST elevation myocardial infarction) (HCC)   AKI (acute kidney injury) (HCC)   Aspiration pneumonia (HCC)   Polysubstance abuse (HCC)   Cognitive and neurobehavioral dysfunction   Leucocytosis   Discharged Condition: stable  Significant Diagnostic Studies: DG Chest 2 View  Result Date: 09/11/2023 CLINICAL DATA:  Pneumonia. EXAM: CHEST - 2 VIEW COMPARISON:  September 08, 2023. FINDINGS: The heart size and mediastinal contours are within normal limits. Sternotomy wires are noted. Stable bilateral lung opacities are noted concerning for multifocal pneumonia. The visualized skeletal structures are unremarkable. IMPRESSION: Stable bilateral lung opacities as noted above. Electronically Signed   By: Lupita Raider M.D.   On: 09/11/2023 15:43   DG Chest 2 View  Result Date: 09/08/2023 CLINICAL DATA:  Chest pain EXAM: CHEST - 2 VIEW COMPARISON:  Chest x-ray 09/02/2023 FINDINGS: Right IJ catheter has been removed. Bilateral multifocal airspace disease has mildly decreased bilaterally. There is no pleural effusion or pneumothorax. Patient is status post cardiac surgery. The cardiomediastinal silhouette appears stable. No fractures are seen. IMPRESSION: Bilateral multifocal airspace disease has mildly decreased bilaterally. Electronically Signed   By: Darliss Cheney M.D.   On: 09/08/2023 21:27   Labs:  Basic Metabolic Panel: Recent Labs  Lab 09/06/23 0502 09/07/23 0609 09/08/23 0746 09/09/23 2105  NA 144 143 139 136  K 3.6 3.5 3.6 3.8  CL 114* 114* 112* 110  CO2 22 20* 19* 19*  GLUCOSE 113* 105* 112* 148*  BUN 29* 26* 24* 17  CREATININE 1.36* 1.11 1.11 1.15  CALCIUM 8.0* 8.0* 8.2* 8.1*  MG 2.4 2.2  --   --   PHOS 2.6 3.1  --   --      CBC: Recent Labs  Lab 09/09/23 2105 09/10/23 1109 09/11/23 0951  WBC 22.8* 21.2* 19.4*  NEUTROABS 19.2* 17.7* 15.9*  HGB 9.5* 9.9* 10.0*  HCT 29.0* 31.0* 31.2*  MCV 86.1 86.6 86.4  PLT 440* 545* 639*    CBG: Recent Labs  Lab 09/08/23 0551 09/08/23 0902 09/08/23 1652 09/09/23 1133 09/09/23 1706  GLUCAP 92 69* 85 70 90    Brief HPI:   Mark Mack is a 67 y.o. male with history of CAD s/p CABG, HTN, EtOH abuse, tobacco use was admitted to [facility] on 08/31/2023 with witnessed cardiac arrest requiring multiple rounds of CPR with ROSC.  UDS positive for THC, cocaine and benzos.  He was found to have inferior STEMI and underwent cardiac cath showing occlusion of ostial circumflex and proximal and mid right coronary which were unable to be crossed for Dr. Don Broach.  He was started on IV heparin but developed complications of alveolar hemorrhage requiring bronc with therapy aspiration of blood from right and left main bronchus which was felt to be due to lung contusion from CPR.  He was treated with IV Zosyn due to concerns of aspiration as well as hemorrhagic pancreatitis and required milrinone as well as pressors for fluid overload.  EEG done showed, the states with moderate diffuse encephalopathy.  He was extubated to high flow [place].    Dr. Amada Jupiter felt that MRI brain not needed as postarrest encephalopathy was improving.  CT abdomen/pelvis done on 11/05 showed persistent peripancreatic stranding compatible with acute pancreatitis, improvement in aspiration signs of acute tubular necrosis.  Electrolyte abnormalities with AKI and hypokalemia was improving.  He was noted to have increased work of breathing with intake therefore diet was downgraded to dysphagia 3 and no straws.  Delirium had resolved.  PT OT was working with patient and CIR was recommended due to functional decline.   Hospital Course: Mark Mack was admitted to rehab 09/08/2023 for inpatient therapies to consist of PT, ST  and OT at least three hours five days a week. Past admission physiatrist, therapy team and rehab RN have worked together to provide customized collaborative inpatient rehab.  He was educated on pulmonary hygiene and oxygen was weaned off prior to discharge.  His p.o. intake has been good and he has been tolerating diet without any signs or symptoms of aspiration.  His blood pressures were monitored on TID basis and has been controlled.  No cardiac symptoms reported with increase in activity. Follow-up labs at admission showed persistent leukocytosis.  He has been afebrile without reports of dysuria, cough, abdominal pain, nausea vomiting or chest pain.  Chest x-ray done showed mild improvement with stable changes.  ID was consulted for input on workup and recommended monitoring on outpatient basis that white count was slowly improving and likely reactive in nature.    Follow-up check of electrolytes shows AKI has resolved.  He is tolerating DAPT without any signs of symptoms of recurrent bleed.  Deep tissue injury on mid sternum was treated with Silvadene cream twice daily.  Sleep-wake disruption has been managed with  use of trazodone.  Neuropsychologist was consulted to evaluate patient's mood and has worked with patient on coping and adjustment issues.  He has made good gains during his stay and is modified independent at discharge.  Supervision is recommended with cognitive tasks.  He will continue to receive outpatient PT and speech therapy at [facility] outpatient rehab after discharge   Rehab course: During patient's stay in rehab team team conference was held to monitor patient's progress, set goals and discuss barriers to discharge. At admission, patient required supervision with mobility and with ADL tasks.  He exhibited decrease in short-term memory with decreased recall as well as problem-solving deficits. He  has had improvement in activity tolerance, balance, postural control as well as ability to  compensate for deficits.  He is able to complete ADL tasks at modified independent level.  He is independent for transfers and to ambulate 400 feet with use of rollator.  He requires supervision to min assist for cognitive tasks and to utilize safe swallow strategies.  Family education has been completed.     Discharge disposition: 01-Home or Self Care  Diet: Heart healthy  Special Instructions: No driving or strenuous activity till cleared by cardiology..  2.  Recommend serial checks of CBC to monitor for resolution of leukocytosis.    Discharge Instructions     Ambulatory referral to Physical Medicine Rehab   Complete by: As directed    Hospital follow up      Allergies as of 09/12/2023   No Known Allergies      Medication List     STOP taking these medications    aspirin 81 MG chewable tablet Replaced by: aspirin EC 81 MG tablet   docusate sodium 100 MG capsule Commonly known as: COLACE   feeding supplement (KATE FARMS STANDARD 1.4) Liqd liquid   folic acid 1 MG tablet Commonly known as: FOLVITE   losartan 25 MG tablet Commonly known as: COZAAR   polyethylene glycol 17 g packet Commonly known  as: MIRALAX / GLYCOLAX   QUEtiapine 25 MG tablet Commonly known as: SEROQUEL   thiamine 100 MG tablet Commonly known as: Vitamin B-1 Replaced by: thiamine 100 MG tablet       TAKE these medications    acetaminophen 325 MG tablet Commonly known as: TYLENOL Take 1-2 tablets (325-650 mg total) by mouth every 4 (four) hours as needed for mild pain (pain score 1-3). What changed:  medication strength how much to take when to take this reasons to take this   aspirin EC 81 MG tablet Take 1 tablet (81 mg total) by mouth daily. Swallow whole. Replaces: aspirin 81 MG chewable tablet   atorvastatin 40 MG tablet Commonly known as: LIPITOR Take 1 tablet (40 mg total) by mouth daily.   clopidogrel 75 MG tablet Commonly known as: PLAVIX Take 1 tablet (75 mg total)  by mouth daily.   Elfolate Plus 3-35-2 MG Tabs Take 1 tablet by mouth daily.   magnesium oxide 400 (240 Mg) MG tablet Commonly known as: MAGnesium-Oxide Take 0.5 tablets (200 mg total) by mouth at bedtime.   metoprolol succinate 25 MG 24 hr tablet Commonly known as: TOPROL-XL Take 0.5 tablets (12.5 mg total) by mouth daily.   multivitamin with minerals Tabs tablet Take 1 tablet by mouth daily.   nitroGLYCERIN 0.4 MG SL tablet Commonly known as: NITROSTAT Place 1 tablet (0.4 mg total) under the tongue every 5 (five) minutes as needed for chest pain. Can use up to three times as needed. You need to call MD if you need to use two or more pills.   pantoprazole 40 MG tablet Commonly known as: PROTONIX Take 1 tablet (40 mg total) by mouth at bedtime.   SSD 1 % cream Generic drug: silver sulfADIAZINE Apply topically 2 (two) times daily.   thiamine 100 MG tablet Commonly known as: VITAMIN B1 Take 1 tablet (100 mg total) by mouth daily. Replaces: thiamine 100 MG tablet   traZODone 50 MG tablet Commonly known as: DESYREL Take 1 tablet (50 mg total) by mouth at bedtime.   vitamin D3 25 MCG tablet Commonly known as: CHOLECALCIFEROL Take 3 tablets (3,000 Units total) by mouth daily.        Follow-up Information     Raulkar, Drema Pry, MD Follow up.   Specialty: Physical Medicine and Rehabilitation Why: office will call you with follow up appointment Contact information: [address] [place] [address] [phone]         Alwyn Pea, MD Follow up.   Specialties: Cardiology, Internal Medicine Why: Call in 1-2 days for post hospital follow up Contact information: [address] [place] [address] [phone]         Teresita Madura, MD Follow up on 09/18/2023.   Specialty: Family Medicine Why: Keep follow up post op appointment. Needs CBC rechecked to monitor white count. Contact information: [address] [place]  [place] [address] [phone]                 Signed: Jacquelynn Cree 09/12/2023, 10:37 AM

## 2023-09-12 DIAGNOSIS — D72829 Elevated white blood cell count, unspecified: Secondary | ICD-10-CM | POA: Insufficient documentation

## 2023-09-12 DIAGNOSIS — G931 Anoxic brain damage, not elsewhere classified: Secondary | ICD-10-CM | POA: Diagnosis not present

## 2023-09-12 NOTE — Progress Notes (Signed)
PROGRESS NOTE   Subjective/Complaints: No new complaints this morning Reviewed his current medications and their indications with him Discussed food sources of vitamins  ROS: denies constipation, denies pain, +shortness of breath only with exertion, denies depression   Objective:   DG Chest 2 View  Result Date: 09/11/2023 CLINICAL DATA:  Pneumonia. EXAM: CHEST - 2 VIEW COMPARISON:  September 08, 2023. FINDINGS: The heart size and mediastinal contours are within normal limits. Sternotomy wires are noted. Stable bilateral lung opacities are noted concerning for multifocal pneumonia. The visualized skeletal structures are unremarkable. IMPRESSION: Stable bilateral lung opacities as noted above. Electronically Signed   By: Lupita Raider M.D.   On: 09/11/2023 15:43   Recent Labs    09/10/23 1109 09/11/23 0951  WBC 21.2* 19.4*  HGB 9.9* 10.0*  HCT 31.0* 31.2*  PLT 545* 639*   Recent Labs    09/09/23 2105  NA 136  K 3.8  CL 110  CO2 19*  GLUCOSE 148*  BUN 17  CREATININE 1.15  CALCIUM 8.1*    Intake/Output Summary (Last 24 hours) at 09/12/2023 1010 Last data filed at 09/12/2023 0738 Gross per 24 hour  Intake 960 ml  Output --  Net 960 ml        Physical Exam: Vital Signs Blood pressure 133/74, pulse 85, temperature 98.8 F (37.1 C), resp. rate 16, height 5\' 6"  (1.676 m), weight 59.4 kg, SpO2 97%. Gen: no distress, normal appearing HEENT: oral mucosa pink and moist, NCAT Cardio: Reg rate  Effort: Pulmonary effort is normal. No respiratory distress.     Breath sounds:Breathing comfortably on room air    Comments: Off O2 Abdominal:     General: There is distension.     Tenderness: There is no abdominal tenderness.  Musculoskeletal:        General: No swelling, tenderness, deformity or signs of injury.     Cervical back: Normal range of motion.  Skin:    General: Skin is warm and dry.     Comments:  Erythema right forearm around prior OS site. Granulation tissue on sternum  Neurological:     Mental Status: He is alert and oriented to person, place, and time.     Comments: Slow speech. Able to answer orientation questions and follow simple motor commands. Fair insight and awareness. No focal CN signs. Normal language and speech. MMT: UE grossly 4/5 prox til 4+/5 distally. LE: 4/5 prox to distal bilaterally. Good sitting balance. No sensory abnl appreciated. DTR's 1+, exam stable 11/15 Psychiatric:     Comments: Friendly and appropriate     Assessment/Plan: 1. Functional deficits which require 3+ hours per day of interdisciplinary therapy in a comprehensive inpatient rehab setting. Physiatrist is providing close team supervision and 24 hour management of active medical problems listed below. Physiatrist and rehab team continue to assess barriers to discharge/monitor patient progress toward functional and medical goals  Care Tool:  Bathing    Body parts bathed by patient: Right arm, Left arm, Chest, Abdomen, Front perineal area, Buttocks, Right upper leg, Face, Left lower leg, Right lower leg, Left upper leg         Bathing assist Assist  Level: Independent with assistive device     Upper Body Dressing/Undressing Upper body dressing   What is the patient wearing?: Pull over shirt    Upper body assist Assist Level: Independent    Lower Body Dressing/Undressing Lower body dressing      What is the patient wearing?: Underwear/pull up, Pants     Lower body assist Assist for lower body dressing: Independent with assitive device     Toileting Toileting    Toileting assist Assist for toileting: Independent with assistive device     Transfers Chair/bed transfer  Transfers assist     Chair/bed transfer assist level: Independent with assistive device Chair/bed transfer assistive device: Armrests, Geologist, engineering   Ambulation assist      Assist  level: Independent with assistive device Assistive device: Rollator Max distance: 400'   Walk 10 feet activity   Assist     Assist level: Independent with assistive device Assistive device: Rollator   Walk 50 feet activity   Assist    Assist level: Independent with assistive device Assistive device: Rollator    Walk 150 feet activity   Assist    Assist level: Independent with assistive device Assistive device: Rollator    Walk 10 feet on uneven surface  activity   Assist     Assist level: Independent with assistive device Assistive device: Rollator   Wheelchair     Assist Is the patient using a wheelchair?: No Type of Wheelchair: Manual    Wheelchair assist level: Supervision/Verbal cueing Max wheelchair distance: 37'    Wheelchair 50 feet with 2 turns activity    Assist    Wheelchair 50 feet with 2 turns activity did not occur:  (fatigue)   Assist Level: Maximal Assistance - Patient 25 - 49%   Wheelchair 150 feet activity     Assist      Assist Level: Maximal Assistance - Patient 25 - 49%   Blood pressure 133/74, pulse 85, temperature 98.8 F (37.1 C), resp. rate 16, height 5\' 6"  (1.676 m), weight 59.4 kg, SpO2 97%.  Medical Problem List and Plan: 1. 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  Antithrombotics: -DVT/anticoagulation:  Mechanical: Sequential compression devices, below knee Bilateral lower extremities             -antiplatelet therapy: DAPT 3. Pain Management: Tylenol prn.  4. Mood/Behavior/Sleep: LCSW to follow for evaluation and  support.              --continue trazodone for sleep wake disruption.              -antipsychotic agents: Seroquel for delirium/sleep.  5. Neuropsych/cognition: This patient is not fully capable of  making decisions on his own behalf. 6. Skin/Wound Care: routine pressure relief measures.              --Silvadene added to area on mid sternum.     7. Fluids/Electrolytes/Nutrition: Monitor I/O. Check CMET ina m.  8. STEMI/hx of CABG  : Jonne Ply resumed on 11/10 and Plavix resumed today.  --monitor for recurrent signs of bleeding.              --add low salt restrictions. Monitor for signs of overload             -  continue Metoprolol, DAPT, Litpitor, d/c losartan 9. Aspiration PNA: Treated with IV Zosyn X 7 days thru 11/09 10. Leucocytosis: Has had rise in WBC to 21.7-->monitor for fevers and other signs of infection             --Encourage pulmonary hygiene. Monitor for symptoms of pancreatitis.   11. ABLA: On DAPT since again since 11/10 and HH stable around 9.8.             --hemoptysis ongoing per patient --continue to monitor for trend with  plavix resumed on 11/11    12. Acute renal failure: Due to cardiogenic shock and resolving. Cr reviewed and has normalized  13. Abnormal LFTs: Improving             -Continue to educate on cessation of ETOH/Drug use  14. Pancreatitis: Monitor for symptoms--amylase/lipase  739/149 @ admission --educate on importance of cessation of polysubstance use.  -drug abuse counseling as outpt  15. Thrombocytopenia: Platelets reviewed and normalized.   16. HTN: normalized, start magnesium gluconate 250mg  HS, d/c losartan, contrinue toprol  17. Vitamin D insufficiency: increase daily D3 to 3,000U, discussed importance of sunlight exposure  18. Dizziness: d/c losartan, BP reviewed and remains stable  19. Alcohol use: educated regarding potential B vitamin and magnesium deficiencies with alcohol use  20. Leukocytosis: CXR ordered and consulted ID, stable for discharge with outpatient WBC monitoring  21. Exertional dyspnea: messaged Erica to please bring incentive spirometer for him   >30 minutes spent in discharge of patient including review of  medications and follow-up appointments, physical examination, and in answering all patient's questions     LOS: 4 days A FACE TO FACE EVALUATION WAS PERFORMED  Edmonia Gonser P Viviann Broyles 09/12/2023, 10:10 AM

## 2023-09-12 NOTE — Progress Notes (Signed)
Inpatient Rehabilitation Discharge Medication Review by a Pharmacist  A complete drug regimen review was completed for this patient to identify any potential clinically significant medication issues.  High Risk Drug Classes Is patient taking? Indication by Medication  Antipsychotic No   Anticoagulant No   Antibiotic No   Opioid No   Antiplatelet Yes Aspirin 81 mg and Clopidogrel - CAD, STEMI  Hypoglycemics/insulin No   Vasoactive Medication Yes Metoprolol succinate - hypertension  Chemotherapy No   Other Yes Atorvastatin - hyperlipidemia Pantoprazole - GERD Trazodone - sleep Methylfolate-B6-B12, Thiamine, Vitamin D, Multivitamin, magnesium oxide - supplements Sulfadiazine cream - wound care  PRNs: Acetaminophen - mild pain Nitroglycerin SL - chest pain     Type of Medication Issue Identified Description of Issue Recommendation(s)  Drug Interaction(s) (clinically significant)     Duplicate Therapy     Allergy     No Medication Administration End Date     Incorrect Dose     Additional Drug Therapy Needed     Significant med changes from prior encounter (inform family/care partners about these prior to discharge). Metoprolol dose decreased. Prior Telmisartan-amlodipine stopped; losartan discontinued on 11/13 due to dizziness. New aspirin, clopidogrel, supplements, cream. Communicate changes with patient/family prior to discharge  Other       Clinically significant medication issues were identified that warrant physician communication and completion of prescribed/recommended actions by midnight of the next day:  No  Pharmacist comments:   Time spent performing this drug regimen review (minutes):  20   Dennie Fetters, [place] 09/12/2023 10:35 AM

## 2023-09-12 NOTE — Progress Notes (Signed)
09/11/23- Late note : Discussed patient with Dr. Elinor Parkinson who recommended monitoring of WBC for resolution as WBC trending down, respiratory status stable and patient without s/s of infection. No further work up recommended at this time.

## 2023-09-12 NOTE — Progress Notes (Signed)
MD made to face visit, PA provided discharge paper work. Prescriptions obtained from [facility] for patient. Staff assisted patient off  the unit, patient discharged via private car with wife.    Tilden Dome, LPN

## 2023-09-24 ENCOUNTER — Encounter: Payer: Medicare HMO | Attending: Registered Nurse | Admitting: Registered Nurse

## 2023-10-01 ENCOUNTER — Other Ambulatory Visit: Payer: Self-pay

## 2023-10-01 ENCOUNTER — Encounter: Payer: Medicare HMO | Attending: Internal Medicine

## 2023-10-01 DIAGNOSIS — I213 ST elevation (STEMI) myocardial infarction of unspecified site: Secondary | ICD-10-CM | POA: Insufficient documentation

## 2023-10-01 NOTE — Progress Notes (Signed)
Virtual Visit completed. Patient informed on EP and RD appointment and 6 Minute walk test. Patient also informed of patient health questionnaires on My Chart. Patient Verbalizes understanding. Visit diagnosis can be found in [facility] 08/31/2023.

## 2023-10-02 VITALS — Ht 66.4 in | Wt 144.5 lb

## 2023-10-02 DIAGNOSIS — I213 ST elevation (STEMI) myocardial infarction of unspecified site: Secondary | ICD-10-CM

## 2023-10-02 NOTE — Progress Notes (Signed)
Cardiac Individual Treatment Plan  Patient Details  Name: Mark Mack MRN: [id number] Date of Birth: February 03, 1956 Referring Provider:   Flowsheet Row Cardiac Rehab from 10/02/2023 in [facility] Cardiac and Pulmonary Rehab  Referring Provider Dorothyann Peng, MD       Initial Encounter Date:  Flowsheet Row Cardiac Rehab from 10/02/2023 in [facility] Cardiac and Pulmonary Rehab  Date 10/02/23       Visit Diagnosis: ST elevation myocardial infarction (STEMI), unspecified artery (HCC)  Patient's Home Medications on Admission:  Current Outpatient Medications:    acetaminophen (TYLENOL) 325 MG tablet, Take 1-2 tablets (325-650 mg total) by mouth every 4 (four) hours as needed for mild pain (pain score 1-3). (Patient not taking: Reported on 10/01/2023), Disp: , Rfl:    aspirin EC 81 MG tablet, Take 1 tablet (81 mg total) by mouth daily. Swallow whole., Disp: 100 tablet, Rfl: 0   atorvastatin (LIPITOR) 40 MG tablet, Take 1 tablet (40 mg total) by mouth daily., Disp: 30 tablet, Rfl: 0   atorvastatin (LIPITOR) 40 MG tablet, Take 1 tablet by mouth daily. (Patient not taking: Reported on 10/01/2023), Disp: , Rfl:    clopidogrel (PLAVIX) 75 MG tablet, Take 1 tablet (75 mg total) by mouth daily. (Patient not taking: Reported on 10/01/2023), Disp: 30 tablet, Rfl: 0   clopidogrel (PLAVIX) 75 MG tablet, Take 1 tablet by mouth daily. (Patient not taking: Reported on 10/01/2023), Disp: , Rfl:    L-METHYLFOLATE CALCIUM PO, Take 1 tablet by mouth daily. (Patient not taking: Reported on 10/01/2023), Disp: , Rfl:    l-methylfolate-B6-B12 (METANX) 3-35-2 MG TABS tablet, Take 1 tablet by mouth daily., Disp: 60 tablet, Rfl: 0   MAGnesium-Oxide 400 (240 Mg) MG tablet, Take 0.5 tablets (200 mg total) by mouth at bedtime., Disp: 30 tablet, Rfl: 0   metoprolol succinate (TOPROL-XL) 25 MG 24 hr tablet, Take 0.5 tablets (12.5 mg total) by mouth daily., Disp: 30 tablet, Rfl: 0   Multiple Vitamin (MULTIVITAMIN WITH MINERALS) TABS tablet,  Take 1 tablet by mouth daily. (Patient not taking: Reported on 10/01/2023), Disp: , Rfl:    naltrexone (DEPADE) 50 MG tablet, Take by mouth. (Patient not taking: Reported on 10/01/2023), Disp: , Rfl:    nitroGLYCERIN (NITROSTAT) 0.4 MG SL tablet, Place 1 tablet (0.4 mg total) under the tongue every 5 (five) minutes as needed for chest pain. Can use up to three times as needed. You need to call MD if you need to use two or more pills., Disp: 30 tablet, Rfl: 0   pantoprazole (PROTONIX) 40 MG tablet, Take 1 tablet (40 mg total) by mouth at bedtime., Disp: 30 tablet, Rfl: 0   pantoprazole (PROTONIX) 40 MG tablet, Take by mouth. (Patient not taking: Reported on 10/01/2023), Disp: , Rfl:    silver sulfADIAZINE (SILVADENE) 1 % cream, Apply topically 2 (two) times daily. (Patient not taking: Reported on 10/01/2023), Disp: 50 g, Rfl: 0   Telmisartan-amLODIPine 80-10 MG TABS, SMARTSIG:1.0 Tablet(s) By Mouth Daily (Patient not taking: Reported on 10/01/2023), Disp: , Rfl:    thiamine (VITAMIN B1) 100 MG tablet, Take 1 tablet (100 mg total) by mouth daily. (Patient not taking: Reported on 10/01/2023), Disp: 30 tablet, Rfl: 0   traZODone (DESYREL) 50 MG tablet, Take 1 tablet (50 mg total) by mouth at bedtime. (Patient not taking: Reported on 10/01/2023), Disp: 30 tablet, Rfl: 0   traZODone (DESYREL) 50 MG tablet, Take by mouth. (Patient not taking: Reported on 10/01/2023), Disp: , Rfl:    vitamin D3 (CHOLECALCIFEROL)  25 MCG tablet, Take 3 tablets (3,000 Units total) by mouth daily., Disp: 90 tablet, Rfl: 0  Past Medical History: Past Medical History:  Diagnosis Date   CAD (coronary artery disease)    Hypertension    OA (osteoarthritis) of hip    bilateral hips   Tobacco use     Tobacco Use: Social History   Tobacco Use  Smoking Status Former   Current packs/day: 0.00   Types: Cigarettes   Quit date: 03/23/2018   Years since quitting: 5.5  Smokeless Tobacco Never    Labs: Review Flowsheet       Latest  Ref Rng & Units 08/31/2023 09/01/2023  Labs for ITP Cardiac and Pulmonary Rehab  Cholestrol 0 - 200 mg/dL 161  -  LDL (calc) 0 - 99 mg/dL 57  -  HDL-C >09 mg/dL 62  -  Trlycerides <604 mg/dL 540  72   Hemoglobin J8J 4.8 - 5.6 % 5.8  -  PH, Arterial 7.35 - 7.45 7.08  7.12  7.06  7.08  6.867  7.46  7.39  7.27  7.2   PCO2 arterial 32 - 48 mmHg 51  48  56  56  75.8  40  48  52  54   Bicarbonate 20.0 - 28.0 mmol/L 15.1  15.6  15.9  16.9  16.6  15.0  13.8  28.4  29.1  23.9  21.1   TCO2 22 - 32 mmol/L 22 - 32 mmol/L 18  16  -  Acid-base deficit 0.0 - 2.0 mmol/L 14.9  13.6  14.6  15.0  13.8  20.0  20.0  3.6  7.4   O2 Saturation % 88.9  96.6  71.4  97.1  74.4  78.4  33  69  93.5  91.3  91.8  98     Details       Multiple values from one day are sorted in reverse-chronological order          Exercise Target Goals: Exercise Program Goal: Individual exercise prescription set using results from initial 6 min walk test and THRR while considering  patient's activity barriers and safety.   Exercise Prescription Goal: Initial exercise prescription builds to 30-45 minutes a day of aerobic activity, 2-3 days per week.  Home exercise guidelines will be given to patient during program as part of exercise prescription that the participant will acknowledge.   Education: Aerobic Exercise: - Group verbal and visual presentation on the components of exercise prescription. Introduces F.I.T.T principle from ACSM for exercise prescriptions.  Reviews F.I.T.T. principles of aerobic exercise including progression. Written material given at graduation. Flowsheet Row Cardiac Rehab from 08/05/2018 in [facility] Cardiac and Pulmonary Rehab  Date 06/01/18 (P)   Educator JH (P)   Instruction Review Code 1- Verbalizes Understanding (P)        Education: Resistance Exercise: - Group verbal and visual presentation on the components of exercise prescription. Introduces F.I.T.T principle from ACSM for exercise prescriptions   Reviews F.I.T.T. principles of resistance exercise including progression. Written material given at graduation.    Education: Exercise & Equipment Safety: - Individual verbal instruction and demonstration of equipment use and safety with use of the equipment. Flowsheet Row Cardiac Rehab from 10/02/2023 in [facility] Cardiac and Pulmonary Rehab  Date 10/02/23  Educator MB  Instruction Review Code 1- Verbalizes Understanding       Education: Exercise Physiology & General Exercise Guidelines: - Group verbal and written instruction with models to review the exercise physiology of the  cardiovascular system and associated critical values. Provides general exercise guidelines with specific guidelines to those with heart or lung disease.    Education: Flexibility, Balance, Mind/Body Relaxation: - Group verbal and visual presentation with interactive activity on the components of exercise prescription. Introduces F.I.T.T principle from ACSM for exercise prescriptions. Reviews F.I.T.T. principles of flexibility and balance exercise training including progression. Also discusses the mind body connection.  Reviews various relaxation techniques to help reduce and manage stress (i.e. Deep breathing, progressive muscle relaxation, and visualization). Balance handout provided to take home. Written material given at graduation. Flowsheet Row Cardiac Rehab from 08/05/2018 in [facility] Cardiac and Pulmonary Rehab  Date 08/03/18 (P)   Educator KH (P)   Instruction Review Code 1- Verbalizes Understanding (P)        Activity Barriers & Risk Stratification:  Activity Barriers & Cardiac Risk Stratification - 10/02/23 1616       Activity Barriers & Cardiac Risk Stratification   Activity Barriers None    Cardiac Risk Stratification High             6 Minute Walk:  6 Minute Walk     Row Name 10/02/23 1614         6 Minute Walk   Phase Initial     Distance 1615 feet     Walk Time 6 minutes     # of Rest  Breaks 0     MPH 3.1     METS 4.51     RPE 13     Perceived Dyspnea  0     VO2 Peak 15.77     Symptoms No     Resting HR 67 bpm     Resting BP 166/86     Resting Oxygen Saturation  97 %     Exercise Oxygen Saturation  during 6 min walk 100 %     Max Ex. HR 115 bpm     Max Ex. BP 184/72     2 Minute Post BP 158/84              Oxygen Initial Assessment:   Oxygen Re-Evaluation:   Oxygen Discharge (Final Oxygen Re-Evaluation):   Initial Exercise Prescription:  Initial Exercise Prescription - 10/02/23 1600       Date of Initial Exercise RX and Referring Provider   Date 10/02/23    Referring Provider Dorothyann Peng, MD      Oxygen   Maintain Oxygen Saturation 88% or higher      Treadmill   MPH 2.5    Grade 1    Minutes 15    METs 3.26      Recumbant Bike   Level 4    RPM 50    Watts 52    Minutes 15    METs 4.51      NuStep   Level 4    SPM 80    Minutes 15    METs 4.51      Elliptical   Level 1    Speed 3    Minutes 15    METs 4.51      REL-XR   Level 3    Speed 50    Minutes 15    METs 4.51      Prescription Details   Frequency (times per week) 3    Duration Progress to 30 minutes of continuous aerobic without signs/symptoms of physical distress      Intensity   THRR 40-80% of Max Heartrate 101-136  Ratings of Perceived Exertion 11-13    Perceived Dyspnea 0-4      Progression   Progression Continue to progress workloads to maintain intensity without signs/symptoms of physical distress.      Resistance Training   Training Prescription Yes    Weight 7 lb    Reps 10-15             Perform Capillary Blood Glucose checks as needed.  Exercise Prescription Changes:   Exercise Prescription Changes     Row Name 10/02/23 1600             Response to Exercise   Blood Pressure (Admit) 166/86       Blood Pressure (Exercise) 184/72       Blood Pressure (Exit) 158/84       Heart Rate (Admit) 67 bpm       Heart Rate  (Exercise) 115 bpm       Heart Rate (Exit) 65 bpm       Oxygen Saturation (Admit) 97 %       Oxygen Saturation (Exercise) 100 %       Oxygen Saturation (Exit) 97 %       Rating of Perceived Exertion (Exercise) 13       Perceived Dyspnea (Exercise) 0       Symptoms none       Comments results       Duration Progress to 30 minutes of  aerobic without signs/symptoms of physical distress       Intensity THRR New         Progression   Progression Continue to progress workloads to maintain intensity without signs/symptoms of physical distress.       Average METs 4.51                Exercise Comments:   Exercise Goals and Review:   Exercise Goals     Row Name 10/02/23 1621             Exercise Goals   Increase Physical Activity Yes       Intervention Provide advice, education, support and counseling about physical activity/exercise needs.;Develop an individualized exercise prescription for aerobic and resistive training based on initial evaluation findings, risk stratification, comorbidities and participant's personal goals.       Expected Outcomes Short Term: Attend rehab on a regular basis to increase amount of physical activity.;Long Term: Add in home exercise to make exercise part of routine and to increase amount of physical activity.;Long Term: Exercising regularly at least 3-5 days a week.       Increase Strength and Stamina Yes       Intervention Provide advice, education, support and counseling about physical activity/exercise needs.;Develop an individualized exercise prescription for aerobic and resistive training based on initial evaluation findings, risk stratification, comorbidities and participant's personal goals.       Expected Outcomes Short Term: Increase workloads from initial exercise prescription for resistance, speed, and METs.;Short Term: Perform resistance training exercises routinely during rehab and add in resistance training at home;Long Term: Improve  cardiorespiratory fitness, muscular endurance and strength as measured by increased METs and functional capacity ( )       Able to understand and use rate of perceived exertion (RPE) scale Yes       Intervention Provide education and explanation on how to use RPE scale       Expected Outcomes Short Term: Able to use RPE daily in rehab to express subjective intensity  level;Long Term:  Able to use RPE to guide intensity level when exercising independently       Able to understand and use Dyspnea scale Yes       Intervention Provide education and explanation on how to use Dyspnea scale       Expected Outcomes Short Term: Able to use Dyspnea scale daily in rehab to express subjective sense of shortness of breath during exertion;Long Term: Able to use Dyspnea scale to guide intensity level when exercising independently       Knowledge and understanding of Target Heart Rate Range (THRR) Yes       Intervention Provide education and explanation of THRR including how the numbers were predicted and where they are located for reference       Expected Outcomes Short Term: Able to state/look up THRR;Short Term: Able to use daily as guideline for intensity in rehab;Long Term: Able to use THRR to govern intensity when exercising independently       Able to check pulse independently Yes       Intervention Provide education and demonstration on how to check pulse in carotid and radial arteries.;Review the importance of being able to check your own pulse for safety during independent exercise       Expected Outcomes Short Term: Able to explain why pulse checking is important during independent exercise;Long Term: Able to check pulse independently and accurately       Understanding of Exercise Prescription Yes       Intervention Provide education, explanation, and written materials on patient's individual exercise prescription       Expected Outcomes Short Term: Able to explain program exercise prescription;Long  Term: Able to explain home exercise prescription to exercise independently                Exercise Goals Re-Evaluation :   Discharge Exercise Prescription (Final Exercise Prescription Changes):  Exercise Prescription Changes - 10/02/23 1600       Response to Exercise   Blood Pressure (Admit) 166/86    Blood Pressure (Exercise) 184/72    Blood Pressure (Exit) 158/84    Heart Rate (Admit) 67 bpm    Heart Rate (Exercise) 115 bpm    Heart Rate (Exit) 65 bpm    Oxygen Saturation (Admit) 97 %    Oxygen Saturation (Exercise) 100 %    Oxygen Saturation (Exit) 97 %    Rating of Perceived Exertion (Exercise) 13    Perceived Dyspnea (Exercise) 0    Symptoms none    Comments results    Duration Progress to 30 minutes of  aerobic without signs/symptoms of physical distress    Intensity THRR New      Progression   Progression Continue to progress workloads to maintain intensity without signs/symptoms of physical distress.    Average METs 4.51             Nutrition:  Target Goals: Understanding of nutrition guidelines, daily intake of sodium 1500mg , cholesterol 200mg , calories 30% from fat and 7% or less from saturated fats, daily to have 5 or more servings of fruits and vegetables.  Education: All About Nutrition: -Group instruction provided by verbal, written material, interactive activities, discussions, models, and posters to present general guidelines for heart healthy nutrition including fat, fiber, MyPlate, the role of sodium in heart healthy nutrition, utilization of the nutrition label, and utilization of this knowledge for meal planning. Follow up email sent as well. Written material given at graduation. Flowsheet Row Cardiac  Rehab from 08/05/2018 in [facility] Cardiac and Pulmonary Rehab  Date 07/06/18 (P)   Educator LB (P)   Instruction Review Code 1- Verbalizes Understanding (P)        Biometrics:  Pre Biometrics - 10/02/23 1621       Pre Biometrics   Height  5' 6.4" (1.687 m)    Weight 144 lb 8 oz (65.5 kg)    Waist Circumference 35 inches    Hip Circumference 36 inches    Waist to Hip Ratio 0.97 %    BMI (Calculated) 23.03    Single Leg Stand 30 seconds              Nutrition Therapy Plan and Nutrition Goals:  Nutrition Therapy & Goals - 10/02/23 1622       Nutrition Therapy   RD appointment deferred Yes      Personal Nutrition Goals   Nutrition Goal RD appointment deferred at this time.      Intervention Plan   Intervention Prescribe, educate and counsel regarding individualized specific dietary modifications aiming towards targeted core components such as weight, hypertension, lipid management, diabetes, heart failure and other comorbidities.;Nutrition handout(s) given to patient.    Expected Outcomes Short Term Goal: Understand basic principles of dietary content, such as calories, fat, sodium, cholesterol and nutrients.;Short Term Goal: A plan has been developed with personal nutrition goals set during dietitian appointment.;Long Term Goal: Adherence to prescribed nutrition plan.             Nutrition Assessments:  MEDIFICTS Score Key: >=70 Need to make dietary changes  40-70 Heart Healthy Diet <= 40 Therapeutic Level Cholesterol Diet   Picture Your Plate Scores: <40 Unhealthy dietary pattern with much room for improvement. 41-50 Dietary pattern unlikely to meet recommendations for good health and room for improvement. 51-60 More healthful dietary pattern, with some room for improvement.  >60 Healthy dietary pattern, although there may be some specific behaviors that could be improved.    Nutrition Goals Re-Evaluation:   Nutrition Goals Discharge (Final Nutrition Goals Re-Evaluation):   Psychosocial: Target Goals: Acknowledge presence or absence of significant depression and/or stress, maximize coping skills, provide positive support system. Participant is able to verbalize types and ability to use techniques  and skills needed for reducing stress and depression.   Education: Stress, Anxiety, and Depression - Group verbal and visual presentation to define topics covered.  Reviews how body is impacted by stress, anxiety, and depression.  Also discusses healthy ways to reduce stress and to treat/manage anxiety and depression.  Written material given at graduation. Flowsheet Row Cardiac Rehab from 08/05/2018 in [facility] Cardiac and Pulmonary Rehab  Date 07/29/18 (P)   Educator KC (P)   Instruction Review Code 1- Verbalizes Understanding (P)        Education: Sleep Hygiene -Provides group verbal and written instruction about how sleep can affect your health.  Define sleep hygiene, discuss sleep cycles and impact of sleep habits. Review good sleep hygiene tips.    Initial Review & Psychosocial Screening:  Initial Psych Review & Screening - 10/01/23 1344       Initial Review   Current issues with Current Stress Concerns    Source of Stress Concerns Chronic Illness      Family Dynamics   Good Support System? Yes    Comments He can look to his wife for support. He has been through the program before and has had some issues with his lifestyle. He is ready to get his health  back on track.      Barriers   Psychosocial barriers to participate in program The patient should benefit from training in stress management and relaxation.      Screening Interventions   Interventions Program counselor consult;To provide support and resources with identified psychosocial needs;Encouraged to exercise;Provide feedback about the scores to participant    Expected Outcomes Short Term goal: Utilizing psychosocial counselor, staff and physician to assist with identification of specific Stressors or current issues interfering with healing process. Setting desired goal for each stressor or current issue identified.;Long Term Goal: Stressors or current issues are controlled or eliminated.;Short Term goal: Identification and  review with participant of any Quality of Life or Depression concerns found by scoring the questionnaire.;Long Term goal: The participant improves quality of Life and PHQ9 Scores as seen by post scores and/or verbalization of changes             Quality of Life Scores:   Scores of 19 and below usually indicate a poorer quality of life in these areas.  A difference of  2-3 points is a clinically meaningful difference.  A difference of 2-3 points in the total score of the Quality of Life Index has been associated with significant improvement in overall quality of life, self-image, physical symptoms, and general health in studies assessing change in quality of life.  PHQ-9: Review Flowsheet       10/02/2023 08/06/2018 05/28/2018  Depression screen PHQ 2/9  Decreased Interest 0 0 2  Down, Depressed, Hopeless 0 0 0  PHQ - 2 Score 0 0 2  Altered sleeping 0 0 0  Tired, decreased energy 0 0 0  Change in appetite 0 0 1  Feeling bad or failure about yourself  1 0 0  Trouble concentrating 0 0 0  Moving slowly or fidgety/restless 0 0 0  Suicidal thoughts 0 0 0  PHQ-9 Score 1 0 3  Difficult doing work/chores Not difficult at all Not difficult at all Not difficult at all    Details           Interpretation of Total Score  Total Score Depression Severity:  1-4 = Minimal depression, 5-9 = Mild depression, 10-14 = Moderate depression, 15-19 = Moderately severe depression, 20-27 = Severe depression   Psychosocial Evaluation and Intervention:  Psychosocial Evaluation - 10/01/23 1348       Psychosocial Evaluation & Interventions   Interventions Relaxation education;Stress management education;Encouraged to exercise with the program and follow exercise prescription    Comments He can look to his wife for support. He has been through the program before and has had some issues with his lifestyle. He is ready to get his health back on track.    Expected Outcomes Short: Start [facility] to help  with mood. Long: Maintain a healthy mental state    Continue Psychosocial Services  Follow up required by staff             Psychosocial Re-Evaluation:   Psychosocial Discharge (Final Psychosocial Re-Evaluation):   Vocational Rehabilitation: Provide vocational rehab assistance to qualifying candidates.   Vocational Rehab Evaluation & Intervention:   Education: Education Goals: Education classes will be provided on a variety of topics geared toward better understanding of heart health and risk factor modification. Participant will state understanding/return demonstration of topics presented as noted by education test scores.  Learning Barriers/Preferences:  Learning Barriers/Preferences - 10/01/23 1343       Learning Barriers/Preferences   Learning Barriers None    Learning  Preferences Computer/Internet;Verbal Instruction;Group Instruction;Skilled Demonstration;Video             General Cardiac Education Topics:  AED/CPR: - Group verbal and written instruction with the use of models to demonstrate the basic use of the AED with the basic ABC's of resuscitation. Flowsheet Row Cardiac Rehab from 08/05/2018 in [facility] Cardiac and Pulmonary Rehab  Date 07/13/18 (P)   Educator CE (P)   Instruction Review Code 1- Verbalizes Understanding (P)        Anatomy and Cardiac Procedures: - Group verbal and visual presentation and models provide information about basic cardiac anatomy and function. Reviews the testing methods done to diagnose heart disease and the outcomes of the test results. Describes the treatment choices: Medical Management, Angioplasty, or Coronary Bypass Surgery for treating various heart conditions including Myocardial Infarction, Angina, Valve Disease, and Cardiac Arrhythmias.  Written material given at graduation. Flowsheet Row Cardiac Rehab from 08/05/2018 in [facility] Cardiac and Pulmonary Rehab  Date 06/15/18 (P)   Educator CE (P)   Instruction Review Code 1-  Verbalizes Understanding (P)        Medication Safety: - Group verbal and visual instruction to review commonly prescribed medications for heart and lung disease. Reviews the medication, class of the drug, and side effects. Includes the steps to properly store meds and maintain the prescription regimen.  Written material given at graduation. Flowsheet Row Cardiac Rehab from 08/05/2018 in [facility] Cardiac and Pulmonary Rehab  Date 06/22/18 (P)   Educator CE (P)   Instruction Review Code 1- Verbalizes Understanding (P)        Intimacy: - Group verbal instruction through game format to discuss how heart and lung disease can affect sexual intimacy. Written material given at graduation.. Flowsheet Row Cardiac Rehab from 08/05/2018 in [facility] Cardiac and Pulmonary Rehab  Date 07/20/18 (P)   Educator CE (P)   Instruction Review Code 1- Verbalizes Understanding (P)        Know Your Numbers and Heart Failure: - Group verbal and visual instruction to discuss disease risk factors for cardiac and pulmonary disease and treatment options.  Reviews associated critical values for Overweight/Obesity, Hypertension, Cholesterol, and Diabetes.  Discusses basics of heart failure: signs/symptoms and treatments.  Introduces Heart Failure Zone chart for action plan for heart failure.  Written material given at graduation. Flowsheet Row Cardiac Rehab from 08/05/2018 in [facility] Cardiac and Pulmonary Rehab  Date 06/15/18 (P)   Educator CE (P)   Instruction Review Code 1- Verbalizes Understanding (P)        Infection Prevention: - Provides verbal and written material to individual with discussion of infection control including proper hand washing and proper equipment cleaning during exercise session. Flowsheet Row Cardiac Rehab from 10/02/2023 in [facility] Cardiac and Pulmonary Rehab  Date 10/02/23  Educator MB  Instruction Review Code 1- Verbalizes Understanding       Falls Prevention: - Provides verbal and written  material to individual with discussion of falls prevention and safety. Flowsheet Row Cardiac Rehab from 10/02/2023 in [facility] Cardiac and Pulmonary Rehab  Date 10/02/23  Educator MB  Instruction Review Code 1- Verbalizes Understanding       Other: -Provides group and verbal instruction on various topics (see comments)   Knowledge Questionnaire Score:   Core Components/Risk Factors/Patient Goals at Admission:  Personal Goals and Risk Factors at Admission - 10/02/23 1623       Core Components/Risk Factors/Patient Goals on Admission    Weight Management Yes;Weight Maintenance    Intervention Weight Management:  Develop a combined nutrition and exercise program designed to reach desired caloric intake, while maintaining appropriate intake of nutrient and fiber, sodium and fats, and appropriate energy expenditure required for the weight goal.;Weight Management: Provide education and appropriate resources to help participant work on and attain dietary goals.;Weight Management/Obesity: Establish reasonable short term and long term weight goals.    Admit Weight 144 lb 8 oz (65.5 kg)    Goal Weight: Short Term 144 lb 8 oz (65.5 kg)    Goal Weight: Long Term 144 lb 8 oz (65.5 kg)    Expected Outcomes Long Term: Adherence to nutrition and physical activity/exercise program aimed toward attainment of established weight goal;Weight Maintenance: Understanding of the daily nutrition guidelines, which includes 25-35% calories from fat, 7% or less cal from saturated fats, less than 200mg  cholesterol, less than 1.5gm of sodium, & 5 or more servings of fruits and vegetables daily;Short Term: Continue to assess and modify interventions until short term weight is achieved;Understanding recommendations for meals to include 15-35% energy as protein, 25-35% energy from fat, 35-60% energy from carbohydrates, less than 200mg  of dietary cholesterol, 20-35 gm of total fiber daily;Understanding of distribution of calorie  intake throughout the day with the consumption of 4-5 meals/snacks    Hypertension Yes    Intervention Provide education on lifestyle modifcations including regular physical activity/exercise, weight management, moderate sodium restriction and increased consumption of fresh fruit, vegetables, and low fat dairy, alcohol moderation, and smoking cessation.;Monitor prescription use compliance.    Expected Outcomes Long Term: Maintenance of blood pressure at goal levels.;Short Term: Continued assessment and intervention until BP is < 140/62mm HG in hypertensive participants. < 130/28mm HG in hypertensive participants with diabetes, heart failure or chronic kidney disease.    Lipids Yes    Intervention Provide education and support for participant on nutrition & aerobic/resistive exercise along with prescribed medications to achieve LDL 70mg , HDL >40mg .    Expected Outcomes Short Term: Participant states understanding of desired cholesterol values and is compliant with medications prescribed. Participant is following exercise prescription and nutrition guidelines.;Long Term: Cholesterol controlled with medications as prescribed, with individualized exercise RX and with personalized nutrition plan. Value goals: LDL < 70mg , HDL > 40 mg.             Education:Diabetes - Individual verbal and written instruction to review signs/symptoms of diabetes, desired ranges of glucose level fasting, after meals and with exercise. Acknowledge that pre and post exercise glucose checks will be done for 3 sessions at entry of program.   Core Components/Risk Factors/Patient Goals Review:    Core Components/Risk Factors/Patient Goals at Discharge (Final Review):    ITP Comments:  ITP Comments     Row Name 10/01/23 1342 10/02/23 1614         ITP Comments Virtual Visit completed. Patient informed on EP and RD appointment and 6 Minute walk test. Patient also informed of patient health questionnaires on My Chart.  Patient Verbalizes understanding. Visit diagnosis can be found in [facility] 08/31/2023. Completed and gym orientation. Initial ITP created and sent for review to Dr. Bethann Punches, Medical Director.               Comments: Initial ITP

## 2023-10-02 NOTE — Patient Instructions (Signed)
Patient Instructions  Patient Details  Name: Mark Mack MRN: [id number] Date of Birth: [birth year] Referring Provider:  Elane Fritz, MD  Below are your personal goals for exercise, nutrition, and risk factors. Our goal is to help you stay on track towards obtaining and maintaining these goals. We will be discussing your progress on these goals with you throughout the program.  Initial Exercise Prescription:  Initial Exercise Prescription - 10/02/23 1600       Date of Initial Exercise RX and Referring Provider   Date 10/02/23    Referring Provider Dorothyann Peng, MD      Oxygen   Maintain Oxygen Saturation 88% or higher      Treadmill   MPH 2.5    Grade 1    Minutes 15    METs 3.26      Recumbant Bike   Level 4    RPM 50    Watts 52    Minutes 15    METs 4.51      NuStep   Level 4    SPM 80    Minutes 15    METs 4.51      Elliptical   Level 1    Speed 3    Minutes 15    METs 4.51      REL-XR   Level 3    Speed 50    Minutes 15    METs 4.51      Prescription Details   Frequency (times per week) 3    Duration Progress to 30 minutes of continuous aerobic without signs/symptoms of physical distress      Intensity   THRR 40-80% of Max Heartrate 101-136    Ratings of Perceived Exertion 11-13    Perceived Dyspnea 0-4      Progression   Progression Continue to progress workloads to maintain intensity without signs/symptoms of physical distress.      Resistance Training   Training Prescription Yes    Weight 7 lb    Reps 10-15             Exercise Goals: Frequency: Be able to perform aerobic exercise two to three times per week in program working toward 2-5 days per week of home exercise.  Intensity: Work with a perceived exertion of 11 (fairly light) - 15 (hard) while following your exercise prescription.  We will make changes to your prescription with you as you progress through the program.   Duration: Be able to do 30 to 45 minutes of  continuous aerobic exercise in addition to a 5 minute warm-up and a 5 minute cool-down routine.   Nutrition Goals: Your personal nutrition goals will be established when you do your nutrition analysis with the dietician.  The following are general nutrition guidelines to follow: Cholesterol < 200mg /day Sodium < 1500mg /day Fiber: Men over 50 yrs - 30 grams per day  Personal Goals:  Personal Goals and Risk Factors at Admission - 10/02/23 1623       Core Components/Risk Factors/Patient Goals on Admission    Weight Management Yes;Weight Maintenance    Intervention Weight Management: Develop a combined nutrition and exercise program designed to reach desired caloric intake, while maintaining appropriate intake of nutrient and fiber, sodium and fats, and appropriate energy expenditure required for the weight goal.;Weight Management: Provide education and appropriate resources to help participant work on and attain dietary goals.;Weight Management/Obesity: Establish reasonable short term and long term weight goals.    Admit Weight 144 lb 8 oz (  65.5 kg)    Goal Weight: Short Term 144 lb 8 oz (65.5 kg)    Goal Weight: Long Term 144 lb 8 oz (65.5 kg)    Expected Outcomes Long Term: Adherence to nutrition and physical activity/exercise program aimed toward attainment of established weight goal;Weight Maintenance: Understanding of the daily nutrition guidelines, which includes 25-35% calories from fat, 7% or less cal from saturated fats, less than 200mg  cholesterol, less than 1.5gm of sodium, & 5 or more servings of fruits and vegetables daily;Short Term: Continue to assess and modify interventions until short term weight is achieved;Understanding recommendations for meals to include 15-35% energy as protein, 25-35% energy from fat, 35-60% energy from carbohydrates, less than 200mg  of dietary cholesterol, 20-35 gm of total fiber daily;Understanding of distribution of calorie intake throughout the day with  the consumption of 4-5 meals/snacks    Hypertension Yes    Intervention Provide education on lifestyle modifcations including regular physical activity/exercise, weight management, moderate sodium restriction and increased consumption of fresh fruit, vegetables, and low fat dairy, alcohol moderation, and smoking cessation.;Monitor prescription use compliance.    Expected Outcomes Long Term: Maintenance of blood pressure at goal levels.;Short Term: Continued assessment and intervention until BP is < 140/32mm HG in hypertensive participants. < 130/55mm HG in hypertensive participants with diabetes, heart failure or chronic kidney disease.    Lipids Yes    Intervention Provide education and support for participant on nutrition & aerobic/resistive exercise along with prescribed medications to achieve LDL 70mg , HDL >40mg .    Expected Outcomes Short Term: Participant states understanding of desired cholesterol values and is compliant with medications prescribed. Participant is following exercise prescription and nutrition guidelines.;Long Term: Cholesterol controlled with medications as prescribed, with individualized exercise RX and with personalized nutrition plan. Value goals: LDL < 70mg , HDL > 40 mg.             Tobacco Use Initial Evaluation: Social History   Tobacco Use  Smoking Status Former   Current packs/day: 0.00   Types: Cigarettes   Quit date: 03/23/2018   Years since quitting: 5.5  Smokeless Tobacco Never    Exercise Goals and Review:  Exercise Goals     Row Name 10/02/23 1621             Exercise Goals   Increase Physical Activity Yes       Intervention Provide advice, education, support and counseling about physical activity/exercise needs.;Develop an individualized exercise prescription for aerobic and resistive training based on initial evaluation findings, risk stratification, comorbidities and participant's personal goals.       Expected Outcomes Short Term: Attend  rehab on a regular basis to increase amount of physical activity.;Long Term: Add in home exercise to make exercise part of routine and to increase amount of physical activity.;Long Term: Exercising regularly at least 3-5 days a week.       Increase Strength and Stamina Yes       Intervention Provide advice, education, support and counseling about physical activity/exercise needs.;Develop an individualized exercise prescription for aerobic and resistive training based on initial evaluation findings, risk stratification, comorbidities and participant's personal goals.       Expected Outcomes Short Term: Increase workloads from initial exercise prescription for resistance, speed, and METs.;Short Term: Perform resistance training exercises routinely during rehab and add in resistance training at home;Long Term: Improve cardiorespiratory fitness, muscular endurance and strength as measured by increased METs and functional capacity ( )       Able to understand and  use rate of perceived exertion (RPE) scale Yes       Intervention Provide education and explanation on how to use RPE scale       Expected Outcomes Short Term: Able to use RPE daily in rehab to express subjective intensity level;Long Term:  Able to use RPE to guide intensity level when exercising independently       Able to understand and use Dyspnea scale Yes       Intervention Provide education and explanation on how to use Dyspnea scale       Expected Outcomes Short Term: Able to use Dyspnea scale daily in rehab to express subjective sense of shortness of breath during exertion;Long Term: Able to use Dyspnea scale to guide intensity level when exercising independently       Knowledge and understanding of Target Heart Rate Range (THRR) Yes       Intervention Provide education and explanation of THRR including how the numbers were predicted and where they are located for reference       Expected Outcomes Short Term: Able to state/look up  THRR;Short Term: Able to use daily as guideline for intensity in rehab;Long Term: Able to use THRR to govern intensity when exercising independently       Able to check pulse independently Yes       Intervention Provide education and demonstration on how to check pulse in carotid and radial arteries.;Review the importance of being able to check your own pulse for safety during independent exercise       Expected Outcomes Short Term: Able to explain why pulse checking is important during independent exercise;Long Term: Able to check pulse independently and accurately       Understanding of Exercise Prescription Yes       Intervention Provide education, explanation, and written materials on patient's individual exercise prescription       Expected Outcomes Short Term: Able to explain program exercise prescription;Long Term: Able to explain home exercise prescription to exercise independently

## 2023-10-08 ENCOUNTER — Encounter: Payer: Medicare HMO | Admitting: *Deleted

## 2023-10-08 DIAGNOSIS — I213 ST elevation (STEMI) myocardial infarction of unspecified site: Secondary | ICD-10-CM

## 2023-10-08 NOTE — Progress Notes (Signed)
Daily Session Note  Patient Details  Name: Mark Mack MRN: [id number] Date of Birth: July 01, 1956 Referring Provider:   Flowsheet Row Cardiac Rehab from 10/02/2023 in [facility] Cardiac and Pulmonary Rehab  Referring Provider Dorothyann Peng, MD       Encounter Date: 10/08/2023  Check In:  Session Check In - 10/08/23 0930       Check-In   Supervising physician immediately available to respond to emergencies See telemetry face sheet for immediately available ER MD    Location [facility]-Cardiac & Pulmonary Rehab    Staff Present Cora Collum, RN, BSN, CCRP;Noah Tickle, BS, Exercise Physiologist;Maxon Conetta BS, , Exercise Physiologist;Ernestine Rohman Katrinka Blazing, RN, ADN    Virtual Visit No    Medication changes reported     No    Fall or balance concerns reported    No    Warm-up and Cool-down Performed on first and last piece of equipment    Resistance Training Performed Yes    VAD Patient? No    PAD/SET Patient? No      Pain Assessment   Currently in Pain? No/denies                Social History   Tobacco Use  Smoking Status Former   Current packs/day: 0.00   Types: Cigarettes   Quit date: 03/23/2018   Years since quitting: 5.5  Smokeless Tobacco Never    Goals Met:  Independence with exercise equipment Exercise tolerated well No report of concerns or symptoms today Strength training completed today  Goals Unmet:  Not Applicable  Comments: First full day of exercise!  Patient was oriented to gym and equipment including functions, settings, policies, and procedures.  Patient's individual exercise prescription and treatment plan were reviewed.  All starting workloads were established based on the results of the 6 minute walk test done at initial orientation visit.  The plan for exercise progression was also introduced and progression will be customized based on patient's performance and goals.    Dr. Bethann Punches is Medical Director for [facility] Cardiac Rehabilitation.  Dr. Vida Rigger is Medical Director for [facility] Pulmonary Rehabilitation.

## 2023-10-10 ENCOUNTER — Encounter: Payer: Medicare HMO | Admitting: *Deleted

## 2023-10-10 DIAGNOSIS — I213 ST elevation (STEMI) myocardial infarction of unspecified site: Secondary | ICD-10-CM | POA: Diagnosis not present

## 2023-10-10 NOTE — Progress Notes (Signed)
Daily Session Note  Patient Details  Name: Mark Mack MRN: [id number] Date of Birth: April 02, 1956 Referring Provider:   Flowsheet Row Cardiac Rehab from 10/02/2023 in [facility] Cardiac and Pulmonary Rehab  Referring Provider Dorothyann Peng, MD       Encounter Date: 10/10/2023  Check In:  Session Check In - 10/10/23 1113       Check-In   Supervising physician immediately available to respond to emergencies See telemetry face sheet for immediately available ER MD    Location [facility]-Cardiac & Pulmonary Rehab    Staff Present Ronette Deter, BS, Exercise Physiologist;Joseph Shelbie Proctor, RN, [place]    Virtual Visit No    Medication changes reported     No    Fall or balance concerns reported    No    Warm-up and Cool-down Performed on first and last piece of equipment    Resistance Training Performed Yes    VAD Patient? No    PAD/SET Patient? No      Pain Assessment   Currently in Pain? No/denies                Social History   Tobacco Use  Smoking Status Former   Current packs/day: 0.00   Types: Cigarettes   Quit date: 03/23/2018   Years since quitting: 5.5  Smokeless Tobacco Never    Goals Met:  Independence with exercise equipment Exercise tolerated well No report of concerns or symptoms today Strength training completed today  Goals Unmet:  Not Applicable  Comments: Pt able to follow exercise prescription today without complaint.  Will continue to monitor for progression.    Dr. Bethann Punches is Medical Director for [facility] Cardiac Rehabilitation.  Dr. Vida Rigger is Medical Director for [facility] Pulmonary Rehabilitation.

## 2023-10-13 ENCOUNTER — Encounter: Payer: Medicare HMO | Admitting: *Deleted

## 2023-10-13 DIAGNOSIS — I213 ST elevation (STEMI) myocardial infarction of unspecified site: Secondary | ICD-10-CM | POA: Diagnosis not present

## 2023-10-13 NOTE — Progress Notes (Signed)
Daily Session Note  Patient Details  Name: Mark Mack MRN: [id number] Date of Birth: 05-01-1956 Referring Provider:   Flowsheet Row Cardiac Rehab from 10/02/2023 in [facility] Cardiac and Pulmonary Rehab  Referring Provider Dorothyann Peng, MD       Encounter Date: 10/13/2023  Check In:  Session Check In - 10/13/23 0955       Check-In   Supervising physician immediately available to respond to emergencies See telemetry face sheet for immediately available ER MD    Location [facility]-Cardiac & Pulmonary Rehab    Staff Present Rory Percy, MS, Exercise Physiologist;Maxon Suzzette Righter, , Exercise Physiologist;Kelly Madilyn Fireman, BS, ACSM CEP, Exercise Physiologist;Arta Stump Katrinka Blazing, RN, ADN    Virtual Visit No    Medication changes reported     No    Fall or balance concerns reported    No    Warm-up and Cool-down Performed on first and last piece of equipment    Resistance Training Performed Yes    VAD Patient? No    PAD/SET Patient? No      Pain Assessment   Currently in Pain? No/denies                Social History   Tobacco Use  Smoking Status Former   Current packs/day: 0.00   Types: Cigarettes   Quit date: 03/23/2018   Years since quitting: 5.5  Smokeless Tobacco Never    Goals Met:  Independence with exercise equipment Exercise tolerated well No report of concerns or symptoms today Strength training completed today  Goals Unmet:  Not Applicable  Comments: Pt able to follow exercise prescription today without complaint.  Will continue to monitor for progression.    Dr. Bethann Punches is Medical Director for [facility] Cardiac Rehabilitation.  Dr. Vida Rigger is Medical Director for [facility] Pulmonary Rehabilitation.

## 2023-10-15 ENCOUNTER — Encounter: Payer: Self-pay | Admitting: *Deleted

## 2023-10-15 ENCOUNTER — Encounter: Payer: Medicare HMO | Admitting: *Deleted

## 2023-10-15 DIAGNOSIS — I213 ST elevation (STEMI) myocardial infarction of unspecified site: Secondary | ICD-10-CM | POA: Diagnosis not present

## 2023-10-15 NOTE — Progress Notes (Signed)
Cardiac Individual Treatment Plan  Patient Details  Name: Mark Mack MRN: [id number] Date of Birth: 05/27/1956 Referring Provider:   Flowsheet Row Cardiac Rehab from 10/02/2023 in [facility] Cardiac and Pulmonary Rehab  Referring Provider Dorothyann Peng, MD       Initial Encounter Date:  Flowsheet Row Cardiac Rehab from 10/02/2023 in [facility] Cardiac and Pulmonary Rehab  Date 10/02/23       Visit Diagnosis: ST elevation myocardial infarction (STEMI), unspecified artery (HCC)  Patient's Home Medications on Admission:  Current Outpatient Medications:    acetaminophen (TYLENOL) 325 MG tablet, Take 1-2 tablets (325-650 mg total) by mouth every 4 (four) hours as needed for mild pain (pain score 1-3). (Patient not taking: Reported on 10/01/2023), Disp: , Rfl:    aspirin EC 81 MG tablet, Take 1 tablet (81 mg total) by mouth daily. Swallow whole., Disp: 100 tablet, Rfl: 0   atorvastatin (LIPITOR) 40 MG tablet, Take 1 tablet (40 mg total) by mouth daily., Disp: 30 tablet, Rfl: 0   atorvastatin (LIPITOR) 40 MG tablet, Take 1 tablet by mouth daily. (Patient not taking: Reported on 10/01/2023), Disp: , Rfl:    clopidogrel (PLAVIX) 75 MG tablet, Take 1 tablet (75 mg total) by mouth daily. (Patient not taking: Reported on 10/01/2023), Disp: 30 tablet, Rfl: 0   clopidogrel (PLAVIX) 75 MG tablet, Take 1 tablet by mouth daily. (Patient not taking: Reported on 10/01/2023), Disp: , Rfl:    L-METHYLFOLATE CALCIUM PO, Take 1 tablet by mouth daily. (Patient not taking: Reported on 10/01/2023), Disp: , Rfl:    l-methylfolate-B6-B12 (METANX) 3-35-2 MG TABS tablet, Take 1 tablet by mouth daily., Disp: 60 tablet, Rfl: 0   MAGnesium-Oxide 400 (240 Mg) MG tablet, Take 0.5 tablets (200 mg total) by mouth at bedtime., Disp: 30 tablet, Rfl: 0   metoprolol succinate (TOPROL-XL) 25 MG 24 hr tablet, Take 0.5 tablets (12.5 mg total) by mouth daily., Disp: 30 tablet, Rfl: 0   Multiple Vitamin (MULTIVITAMIN WITH MINERALS) TABS tablet,  Take 1 tablet by mouth daily. (Patient not taking: Reported on 10/01/2023), Disp: , Rfl:    naltrexone (DEPADE) 50 MG tablet, Take by mouth. (Patient not taking: Reported on 10/01/2023), Disp: , Rfl:    nitroGLYCERIN (NITROSTAT) 0.4 MG SL tablet, Place 1 tablet (0.4 mg total) under the tongue every 5 (five) minutes as needed for chest pain. Can use up to three times as needed. You need to call MD if you need to use two or more pills., Disp: 30 tablet, Rfl: 0   pantoprazole (PROTONIX) 40 MG tablet, Take 1 tablet (40 mg total) by mouth at bedtime., Disp: 30 tablet, Rfl: 0   pantoprazole (PROTONIX) 40 MG tablet, Take by mouth. (Patient not taking: Reported on 10/01/2023), Disp: , Rfl:    silver sulfADIAZINE (SILVADENE) 1 % cream, Apply topically 2 (two) times daily. (Patient not taking: Reported on 10/01/2023), Disp: 50 g, Rfl: 0   Telmisartan-amLODIPine 80-10 MG TABS, SMARTSIG:1.0 Tablet(s) By Mouth Daily (Patient not taking: Reported on 10/01/2023), Disp: , Rfl:    thiamine (VITAMIN B1) 100 MG tablet, Take 1 tablet (100 mg total) by mouth daily. (Patient not taking: Reported on 10/01/2023), Disp: 30 tablet, Rfl: 0   traZODone (DESYREL) 50 MG tablet, Take 1 tablet (50 mg total) by mouth at bedtime. (Patient not taking: Reported on 10/01/2023), Disp: 30 tablet, Rfl: 0   traZODone (DESYREL) 50 MG tablet, Take by mouth. (Patient not taking: Reported on 10/01/2023), Disp: , Rfl:    vitamin D3 (CHOLECALCIFEROL)  25 MCG tablet, Take 3 tablets (3,000 Units total) by mouth daily., Disp: 90 tablet, Rfl: 0  Past Medical History: Past Medical History:  Diagnosis Date   CAD (coronary artery disease)    Hypertension    OA (osteoarthritis) of hip    bilateral hips   Tobacco use     Tobacco Use: Social History   Tobacco Use  Smoking Status Former   Current packs/day: 0.00   Types: Cigarettes   Quit date: 03/23/2018   Years since quitting: 5.5  Smokeless Tobacco Never    Labs: Review Flowsheet       Latest  Ref Rng & Units 08/31/2023 09/01/2023  Labs for ITP Cardiac and Pulmonary Rehab  Cholestrol 0 - 200 mg/dL 161  -  LDL (calc) 0 - 99 mg/dL 57  -  HDL-C >09 mg/dL 62  -  Trlycerides <604 mg/dL 540  72   Hemoglobin J8J 4.8 - 5.6 % 5.8  -  PH, Arterial 7.35 - 7.45 7.08  7.12  7.06  7.08  6.867  7.46  7.39  7.27  7.2   PCO2 arterial 32 - 48 mmHg 51  48  56  56  75.8  40  48  52  54   Bicarbonate 20.0 - 28.0 mmol/L 15.1  15.6  15.9  16.9  16.6  15.0  13.8  28.4  29.1  23.9  21.1   TCO2 22 - 32 mmol/L 22 - 32 mmol/L 18  16  -  Acid-base deficit 0.0 - 2.0 mmol/L 14.9  13.6  14.6  15.0  13.8  20.0  20.0  3.6  7.4   O2 Saturation % 88.9  96.6  71.4  97.1  74.4  78.4  33  69  93.5  91.3  91.8  98     Details       Multiple values from one day are sorted in reverse-chronological order          Exercise Target Goals: Exercise Program Goal: Individual exercise prescription set using results from initial 6 min walk test and THRR while considering  patient's activity barriers and safety.   Exercise Prescription Goal: Initial exercise prescription builds to 30-45 minutes a day of aerobic activity, 2-3 days per week.  Home exercise guidelines will be given to patient during program as part of exercise prescription that the participant will acknowledge.   Education: Aerobic Exercise: - Group verbal and visual presentation on the components of exercise prescription. Introduces F.I.T.T principle from ACSM for exercise prescriptions.  Reviews F.I.T.T. principles of aerobic exercise including progression. Written material given at graduation. Flowsheet Row Cardiac Rehab from 08/05/2018 in [facility] Cardiac and Pulmonary Rehab  Date 06/01/18 (P)   Educator JH (P)   Instruction Review Code 1- Verbalizes Understanding (P)        Education: Resistance Exercise: - Group verbal and visual presentation on the components of exercise prescription. Introduces F.I.T.T principle from ACSM for exercise prescriptions   Reviews F.I.T.T. principles of resistance exercise including progression. Written material given at graduation. Flowsheet Row Cardiac Rehab from 10/08/2023 in [facility] Cardiac and Pulmonary Rehab  Education need identified 10/08/23        Education: Exercise & Equipment Safety: - Individual verbal instruction and demonstration of equipment use and safety with use of the equipment. Flowsheet Row Cardiac Rehab from 10/08/2023 in [facility] Cardiac and Pulmonary Rehab  Date 10/02/23  Educator MB  Instruction Review Code 1- [employer] Understanding       Education:  Exercise Physiology & General Exercise Guidelines: - Group verbal and written instruction with models to review the exercise physiology of the cardiovascular system and associated critical values. Provides general exercise guidelines with specific guidelines to those with heart or lung disease.  Flowsheet Row Cardiac Rehab from 10/08/2023 in [facility] Cardiac and Pulmonary Rehab  Education need identified 10/08/23       Education: Flexibility, Balance, Mind/Body Relaxation: - Group verbal and visual presentation with interactive activity on the components of exercise prescription. Introduces F.I.T.T principle from ACSM for exercise prescriptions. Reviews F.I.T.T. principles of flexibility and balance exercise training including progression. Also discusses the mind body connection.  Reviews various relaxation techniques to help reduce and manage stress (i.e. Deep breathing, progressive muscle relaxation, and visualization). Balance handout provided to take home. Written material given at graduation. Flowsheet Row Cardiac Rehab from 08/05/2018 in [facility] Cardiac and Pulmonary Rehab  Date 08/03/18 (P)   Educator KH (P)   Instruction Review Code 1- Verbalizes Understanding (P)        Activity Barriers & Risk Stratification:  Activity Barriers & Cardiac Risk Stratification - 10/02/23 1616       Activity Barriers & Cardiac Risk Stratification    Activity Barriers None    Cardiac Risk Stratification High             6 Minute Walk:  6 Minute Walk     Row Name 10/02/23 1614         6 Minute Walk   Phase Initial     Distance 1615 feet     Walk Time 6 minutes     # of Rest Breaks 0     MPH 3.1     METS 4.51     RPE 13     Perceived Dyspnea  0     VO2 Peak 15.77     Symptoms No     Resting HR 67 bpm     Resting BP 166/86     Resting Oxygen Saturation  97 %     Exercise Oxygen Saturation  during 6 min walk 100 %     Max Ex. HR 115 bpm     Max Ex. BP 184/72     2 Minute Post BP 158/84              Oxygen Initial Assessment:   Oxygen Re-Evaluation:   Oxygen Discharge (Final Oxygen Re-Evaluation):   Initial Exercise Prescription:  Initial Exercise Prescription - 10/02/23 1600       Date of Initial Exercise RX and Referring Provider   Date 10/02/23    Referring Provider Dorothyann Peng, MD      Oxygen   Maintain Oxygen Saturation 88% or higher      Treadmill   MPH 2.5    Grade 1    Minutes 15    METs 3.26      Recumbant Bike   Level 4    RPM 50    Watts 52    Minutes 15    METs 4.51      NuStep   Level 4    SPM 80    Minutes 15    METs 4.51      Elliptical   Level 1    Speed 3    Minutes 15    METs 4.51      REL-XR   Level 3    Speed 50    Minutes 15    METs 4.51  Prescription Details   Frequency (times per week) 3    Duration Progress to 30 minutes of continuous aerobic without signs/symptoms of physical distress      Intensity   THRR 40-80% of Max Heartrate 101-136    Ratings of Perceived Exertion 11-13    Perceived Dyspnea 0-4      Progression   Progression Continue to progress workloads to maintain intensity without signs/symptoms of physical distress.      Resistance Training   Training Prescription Yes    Weight 7 lb    Reps 10-15             Perform Capillary Blood Glucose checks as needed.  Exercise Prescription Changes:   Exercise  Prescription Changes     Row Name 10/02/23 1600             Response to Exercise   Blood Pressure (Admit) 166/86       Blood Pressure (Exercise) 184/72       Blood Pressure (Exit) 158/84       Heart Rate (Admit) 67 bpm       Heart Rate (Exercise) 115 bpm       Heart Rate (Exit) 65 bpm       Oxygen Saturation (Admit) 97 %       Oxygen Saturation (Exercise) 100 %       Oxygen Saturation (Exit) 97 %       Rating of Perceived Exertion (Exercise) 13       Perceived Dyspnea (Exercise) 0       Symptoms none       Comments results       Duration Progress to 30 minutes of  aerobic without signs/symptoms of physical distress       Intensity THRR New         Progression   Progression Continue to progress workloads to maintain intensity without signs/symptoms of physical distress.       Average METs 4.51                Exercise Comments:   Exercise Comments     Row Name 10/08/23 0932           Exercise Comments First full day of exercise!  Patient was oriented to gym and equipment including functions, settings, policies, and procedures.  Patient's individual exercise prescription and treatment plan were reviewed.  All starting workloads were established based on the results of the 6 minute walk test done at initial orientation visit.  The plan for exercise progression was also introduced and progression will be customized based on patient's performance and goals.                Exercise Goals and Review:   Exercise Goals     Row Name 10/02/23 1621             Exercise Goals   Increase Physical Activity Yes       Intervention Provide advice, education, support and counseling about physical activity/exercise needs.;Develop an individualized exercise prescription for aerobic and resistive training based on initial evaluation findings, risk stratification, comorbidities and participant's personal goals.       Expected Outcomes Short Term: Attend rehab on a  regular basis to increase amount of physical activity.;Long Term: Add in home exercise to make exercise part of routine and to increase amount of physical activity.;Long Term: Exercising regularly at least 3-5 days a week.       Increase Strength and  Stamina Yes       Intervention Provide advice, education, support and counseling about physical activity/exercise needs.;Develop an individualized exercise prescription for aerobic and resistive training based on initial evaluation findings, risk stratification, comorbidities and participant's personal goals.       Expected Outcomes Short Term: Increase workloads from initial exercise prescription for resistance, speed, and METs.;Short Term: Perform resistance training exercises routinely during rehab and add in resistance training at home;Long Term: Improve cardiorespiratory fitness, muscular endurance and strength as measured by increased METs and functional capacity ( )       Able to understand and use rate of perceived exertion (RPE) scale Yes       Intervention Provide education and explanation on how to use RPE scale       Expected Outcomes Short Term: Able to use RPE daily in rehab to express subjective intensity level;Long Term:  Able to use RPE to guide intensity level when exercising independently       Able to understand and use Dyspnea scale Yes       Intervention Provide education and explanation on how to use Dyspnea scale       Expected Outcomes Short Term: Able to use Dyspnea scale daily in rehab to express subjective sense of shortness of breath during exertion;Long Term: Able to use Dyspnea scale to guide intensity level when exercising independently       Knowledge and understanding of Target Heart Rate Range (THRR) Yes       Intervention Provide education and explanation of THRR including how the numbers were predicted and where they are located for reference       Expected Outcomes Short Term: Able to state/look up THRR;Short Term:  Able to use daily as guideline for intensity in rehab;Long Term: Able to use THRR to govern intensity when exercising independently       Able to check pulse independently Yes       Intervention Provide education and demonstration on how to check pulse in carotid and radial arteries.;Review the importance of being able to check your own pulse for safety during independent exercise       Expected Outcomes Short Term: Able to explain why pulse checking is important during independent exercise;Long Term: Able to check pulse independently and accurately       Understanding of Exercise Prescription Yes       Intervention Provide education, explanation, and written materials on patient's individual exercise prescription       Expected Outcomes Short Term: Able to explain program exercise prescription;Long Term: Able to explain home exercise prescription to exercise independently                Exercise Goals Re-Evaluation :  Exercise Goals Re-Evaluation     Row Name 10/08/23 0932             Exercise Goal Re-Evaluation   Exercise Goals Review Able to understand and use rate of perceived exertion (RPE) scale;Able to understand and use Dyspnea scale;Knowledge and understanding of Target Heart Rate Range (THRR);Understanding of Exercise Prescription       Comments Reviewed RPE and dyspnea scale, THR and program prescription with pt today.  Pt voiced understanding and was given a copy of goals to take home.       Expected Outcomes Short: Use RPE daily to regulate intensity. Long: Follow program prescription in THR.                Discharge Exercise Prescription (Final Exercise  Prescription Changes):  Exercise Prescription Changes - 10/02/23 1600       Response to Exercise   Blood Pressure (Admit) 166/86    Blood Pressure (Exercise) 184/72    Blood Pressure (Exit) 158/84    Heart Rate (Admit) 67 bpm    Heart Rate (Exercise) 115 bpm    Heart Rate (Exit) 65 bpm    Oxygen Saturation  (Admit) 97 %    Oxygen Saturation (Exercise) 100 %    Oxygen Saturation (Exit) 97 %    Rating of Perceived Exertion (Exercise) 13    Perceived Dyspnea (Exercise) 0    Symptoms none    Comments results    Duration Progress to 30 minutes of  aerobic without signs/symptoms of physical distress    Intensity THRR New      Progression   Progression Continue to progress workloads to maintain intensity without signs/symptoms of physical distress.    Average METs 4.51             Nutrition:  Target Goals: Understanding of nutrition guidelines, daily intake of sodium 1500mg , cholesterol 200mg , calories 30% from fat and 7% or less from saturated fats, daily to have 5 or more servings of fruits and vegetables.  Education: All About Nutrition: -Group instruction provided by verbal, written material, interactive activities, discussions, models, and posters to present general guidelines for heart healthy nutrition including fat, fiber, MyPlate, the role of sodium in heart healthy nutrition, utilization of the nutrition label, and utilization of this knowledge for meal planning. Follow up email sent as well. Written material given at graduation. Flowsheet Row Cardiac Rehab from 10/08/2023 in [facility] Cardiac and Pulmonary Rehab  Education need identified 10/08/23       Biometrics:  Pre Biometrics - 10/02/23 1621       Pre Biometrics   Height 5' 6.4" (1.687 m)    Weight 144 lb 8 oz (65.5 kg)    Waist Circumference 35 inches    Hip Circumference 36 inches    Waist to Hip Ratio 0.97 %    BMI (Calculated) 23.03    Single Leg Stand 30 seconds              Nutrition Therapy Plan and Nutrition Goals:  Nutrition Therapy & Goals - 10/02/23 1622       Nutrition Therapy   RD appointment deferred Yes      Personal Nutrition Goals   Nutrition Goal RD appointment deferred at this time.      Intervention Plan   Intervention Prescribe, educate and counsel regarding individualized  specific dietary modifications aiming towards targeted core components such as weight, hypertension, lipid management, diabetes, heart failure and other comorbidities.;Nutrition handout(s) given to patient.    Expected Outcomes Short Term Goal: Understand basic principles of dietary content, such as calories, fat, sodium, cholesterol and nutrients.;Short Term Goal: A plan has been developed with personal nutrition goals set during dietitian appointment.;Long Term Goal: Adherence to prescribed nutrition plan.             Nutrition Assessments:  MEDIFICTS Score Key: >=70 Need to make dietary changes  40-70 Heart Healthy Diet <= 40 Therapeutic Level Cholesterol Diet  Flowsheet Row Cardiac Rehab from 10/08/2023 in [facility] Cardiac and Pulmonary Rehab  Picture Your Plate Total Score on Admission 43      Picture Your Plate Scores: <27 Unhealthy dietary pattern with much room for improvement. 41-50 Dietary pattern unlikely to meet recommendations for good health and room for improvement. 51-60  More healthful dietary pattern, with some room for improvement.  >60 Healthy dietary pattern, although there may be some specific behaviors that could be improved.    Nutrition Goals Re-Evaluation:   Nutrition Goals Discharge (Final Nutrition Goals Re-Evaluation):   Psychosocial: Target Goals: Acknowledge presence or absence of significant depression and/or stress, maximize coping skills, provide positive support system. Participant is able to verbalize types and ability to use techniques and skills needed for reducing stress and depression.   Education: Stress, Anxiety, and Depression - Group verbal and visual presentation to define topics covered.  Reviews how body is impacted by stress, anxiety, and depression.  Also discusses healthy ways to reduce stress and to treat/manage anxiety and depression.  Written material given at graduation. Flowsheet Row Cardiac Rehab from 10/08/2023 in [facility] Cardiac  and Pulmonary Rehab  Education need identified 10/08/23       Education: Sleep Hygiene -Provides group verbal and written instruction about how sleep can affect your health.  Define sleep hygiene, discuss sleep cycles and impact of sleep habits. Review good sleep hygiene tips.    Initial Review & Psychosocial Screening:  Initial Psych Review & Screening - 10/01/23 1344       Initial Review   Current issues with Current Stress Concerns    Source of Stress Concerns Chronic Illness      Family Dynamics   Good Support System? Yes    Comments He can look to his wife for support. He has been through the program before and has had some issues with his lifestyle. He is ready to get his health back on track.      Barriers   Psychosocial barriers to participate in program The patient should benefit from training in stress management and relaxation.      Screening Interventions   Interventions Program counselor consult;To provide support and resources with identified psychosocial needs;Encouraged to exercise;Provide feedback about the scores to participant    Expected Outcomes Short Term goal: Utilizing psychosocial counselor, staff and physician to assist with identification of specific Stressors or current issues interfering with healing process. Setting desired goal for each stressor or current issue identified.;Long Term Goal: Stressors or current issues are controlled or eliminated.;Short Term goal: Identification and review with participant of any Quality of Life or Depression concerns found by scoring the questionnaire.;Long Term goal: The participant improves quality of Life and PHQ9 Scores as seen by post scores and/or verbalization of changes             Quality of Life Scores:   Scores of 19 and below usually indicate a poorer quality of life in these areas.  A difference of  2-3 points is a clinically meaningful difference.  A difference of 2-3 points in the total score of the  Quality of Life Index has been associated with significant improvement in overall quality of life, self-image, physical symptoms, and general health in studies assessing change in quality of life.  PHQ-9: Review Flowsheet       10/02/2023 08/06/2018 05/28/2018  Depression screen PHQ 2/9  Decreased Interest 0 0 2  Down, Depressed, Hopeless 0 0 0  PHQ - 2 Score 0 0 2  Altered sleeping 0 0 0  Tired, decreased energy 0 0 0  Change in appetite 0 0 1  Feeling bad or failure about yourself  1 0 0  Trouble concentrating 0 0 0  Moving slowly or fidgety/restless 0 0 0  Suicidal thoughts 0 0 0  PHQ-9 Score 1  0 3  Difficult doing work/chores Not difficult at all Not difficult at all Not difficult at all   Interpretation of Total Score  Total Score Depression Severity:  1-4 = Minimal depression, 5-9 = Mild depression, 10-14 = Moderate depression, 15-19 = Moderately severe depression, 20-27 = Severe depression   Psychosocial Evaluation and Intervention:  Psychosocial Evaluation - 10/01/23 1348       Psychosocial Evaluation & Interventions   Interventions Relaxation education;Stress management education;Encouraged to exercise with the program and follow exercise prescription    Comments He can look to his wife for support. He has been through the program before and has had some issues with his lifestyle. He is ready to get his health back on track.    Expected Outcomes Short: Start [facility] to help with mood. Long: Maintain a healthy mental state    Continue Psychosocial Services  Follow up required by staff             Psychosocial Re-Evaluation:   Psychosocial Discharge (Final Psychosocial Re-Evaluation):   Vocational Rehabilitation: Provide vocational rehab assistance to qualifying candidates.   Vocational Rehab Evaluation & Intervention:   Education: Education Goals: Education classes will be provided on a variety of topics geared toward better understanding of heart health  and risk factor modification. Participant will state understanding/return demonstration of topics presented as noted by education test scores.  Learning Barriers/Preferences:  Learning Barriers/Preferences - 10/01/23 1343       Learning Barriers/Preferences   Learning Barriers None    Learning Preferences Computer/Internet;Verbal Instruction;Group Instruction;Skilled Demonstration;Video             General Cardiac Education Topics:  AED/CPR: - Group verbal and written instruction with the use of models to demonstrate the basic use of the AED with the basic ABC's of resuscitation. Flowsheet Row Cardiac Rehab from 08/05/2018 in [facility] Cardiac and Pulmonary Rehab  Date 07/13/18 (P)   Educator CE (P)   Instruction Review Code 1- Verbalizes Understanding (P)        Anatomy and Cardiac Procedures: - Group verbal and visual presentation and models provide information about basic cardiac anatomy and function. Reviews the testing methods done to diagnose heart disease and the outcomes of the test results. Describes the treatment choices: Medical Management, Angioplasty, or Coronary Bypass Surgery for treating various heart conditions including Myocardial Infarction, Angina, Valve Disease, and Cardiac Arrhythmias.  Written material given at graduation. Flowsheet Row Cardiac Rehab from 10/08/2023 in [facility] Cardiac and Pulmonary Rehab  Education need identified 10/08/23       Medication Safety: - Group verbal and visual instruction to review commonly prescribed medications for heart and lung disease. Reviews the medication, class of the drug, and side effects. Includes the steps to properly store meds and maintain the prescription regimen.  Written material given at graduation. Flowsheet Row Cardiac Rehab from 08/05/2018 in [facility] Cardiac and Pulmonary Rehab  Date 06/22/18 (P)   Educator CE (P)   Instruction Review Code 1- Verbalizes Understanding (P)        Intimacy: - Group verbal  instruction through game format to discuss how heart and lung disease can affect sexual intimacy. Written material given at graduation.. Flowsheet Row Cardiac Rehab from 10/08/2023 in [facility] Cardiac and Pulmonary Rehab  Education need identified 10/08/23       Know Your Numbers and Heart Failure: - Group verbal and visual instruction to discuss disease risk factors for cardiac and pulmonary disease and treatment options.  Reviews associated critical values for Overweight/Obesity,  Hypertension, Cholesterol, and Diabetes.  Discusses basics of heart failure: signs/symptoms and treatments.  Introduces Heart Failure Zone chart for action plan for heart failure.  Written material given at graduation. Flowsheet Row Cardiac Rehab from 10/08/2023 in [facility] Cardiac and Pulmonary Rehab  Education need identified 10/08/23       Infection Prevention: - Provides verbal and written material to individual with discussion of infection control including proper hand washing and proper equipment cleaning during exercise session. Flowsheet Row Cardiac Rehab from 10/08/2023 in [facility] Cardiac and Pulmonary Rehab  Date 10/02/23  Educator MB  Instruction Review Code 1- Verbalizes Understanding       Falls Prevention: - Provides verbal and written material to individual with discussion of falls prevention and safety. Flowsheet Row Cardiac Rehab from 10/08/2023 in [facility] Cardiac and Pulmonary Rehab  Date 10/02/23  Educator MB  Instruction Review Code 1- Verbalizes Understanding       Other: -Provides group and verbal instruction on various topics (see comments)   Knowledge Questionnaire Score:  Knowledge Questionnaire Score - 10/08/23 1205       Knowledge Questionnaire Score   Pre Score 18/26             Core Components/Risk Factors/Patient Goals at Admission:  Personal Goals and Risk Factors at Admission - 10/02/23 1623       Core Components/Risk Factors/Patient Goals on Admission    Weight  Management Yes;Weight Maintenance    Intervention Weight Management: Develop a combined nutrition and exercise program designed to reach desired caloric intake, while maintaining appropriate intake of nutrient and fiber, sodium and fats, and appropriate energy expenditure required for the weight goal.;Weight Management: Provide education and appropriate resources to help participant work on and attain dietary goals.;Weight Management/Obesity: Establish reasonable short term and long term weight goals.    Admit Weight 144 lb 8 oz (65.5 kg)    Goal Weight: Short Term 144 lb 8 oz (65.5 kg)    Goal Weight: Long Term 144 lb 8 oz (65.5 kg)    Expected Outcomes Long Term: Adherence to nutrition and physical activity/exercise program aimed toward attainment of established weight goal;Weight Maintenance: Understanding of the daily nutrition guidelines, which includes 25-35% calories from fat, 7% or less cal from saturated fats, less than 200mg  cholesterol, less than 1.5gm of sodium, & 5 or more servings of fruits and vegetables daily;Short Term: Continue to assess and modify interventions until short term weight is achieved;Understanding recommendations for meals to include 15-35% energy as protein, 25-35% energy from fat, 35-60% energy from carbohydrates, less than 200mg  of dietary cholesterol, 20-35 gm of total fiber daily;Understanding of distribution of calorie intake throughout the day with the consumption of 4-5 meals/snacks    Hypertension Yes    Intervention Provide education on lifestyle modifcations including regular physical activity/exercise, weight management, moderate sodium restriction and increased consumption of fresh fruit, vegetables, and low fat dairy, alcohol moderation, and smoking cessation.;Monitor prescription use compliance.    Expected Outcomes Long Term: Maintenance of blood pressure at goal levels.;Short Term: Continued assessment and intervention until BP is < 140/25mm HG in hypertensive  participants. < 130/42mm HG in hypertensive participants with diabetes, heart failure or chronic kidney disease.    Lipids Yes    Intervention Provide education and support for participant on nutrition & aerobic/resistive exercise along with prescribed medications to achieve LDL 70mg , HDL >40mg .    Expected Outcomes Short Term: Participant states understanding of desired cholesterol values and is compliant with medications prescribed. Participant is following  exercise prescription and nutrition guidelines.;Long Term: Cholesterol controlled with medications as prescribed, with individualized exercise RX and with personalized nutrition plan. Value goals: LDL < 70mg , HDL > 40 mg.             Education:Diabetes - Individual verbal and written instruction to review signs/symptoms of diabetes, desired ranges of glucose level fasting, after meals and with exercise. Acknowledge that pre and post exercise glucose checks will be done for 3 sessions at entry of program.   Core Components/Risk Factors/Patient Goals Review:    Core Components/Risk Factors/Patient Goals at Discharge (Final Review):    ITP Comments:  ITP Comments     Row Name 10/01/23 1342 10/02/23 1614 10/08/23 0931 10/15/23 1122     ITP Comments Virtual Visit completed. Patient informed on EP and RD appointment and 6 Minute walk test. Patient also informed of patient health questionnaires on My Chart. Patient Verbalizes understanding. Visit diagnosis can be found in [facility] 08/31/2023. Completed and gym orientation. Initial ITP created and sent for review to Dr. Bethann Punches, Medical Director. First full day of exercise!  Patient was oriented to gym and equipment including functions, settings, policies, and procedures.  Patient's individual exercise prescription and treatment plan were reviewed.  All starting workloads were established based on the results of the 6 minute walk test done at initial orientation visit.  The plan for  exercise progression was also introduced and progression will be customized based on patient's performance and goals. 30 Day review completed. Medical Director ITP review done, changes made as directed, and signed approval by Medical Director.    new to program             Comments:

## 2023-10-15 NOTE — Progress Notes (Signed)
Daily Session Note  Patient Details  Name: Mark Mack MRN: [id number] Date of Birth: 06/19/1956 Referring Provider:   Flowsheet Row Cardiac Rehab from 10/02/2023 in [facility] Cardiac and Pulmonary Rehab  Referring Provider Dorothyann Peng, MD       Encounter Date: 10/15/2023  Check In:  Session Check In - 10/15/23 0948       Check-In   Supervising physician immediately available to respond to emergencies See telemetry face sheet for immediately available ER MD    Location [facility]-Cardiac & Pulmonary Rehab    Staff Present Elige Ko, RCP,RRT,BSRT;Krista Karleen Hampshire RN, BSN;Maxon Conetta BS, , Exercise Physiologist;Janijah Symons Katrinka Blazing, RN, ADN    Virtual Visit No    Medication changes reported     No    Fall or balance concerns reported    No    Warm-up and Cool-down Performed on first and last piece of equipment    Resistance Training Performed Yes    VAD Patient? No    PAD/SET Patient? No      Pain Assessment   Currently in Pain? No/denies                Social History   Tobacco Use  Smoking Status Former   Current packs/day: 0.00   Types: Cigarettes   Quit date: 03/23/2018   Years since quitting: 5.5  Smokeless Tobacco Never    Goals Met:  Independence with exercise equipment Exercise tolerated well No report of concerns or symptoms today Strength training completed today  Goals Unmet:  Not Applicable  Comments: Pt able to follow exercise prescription today without complaint.  Will continue to monitor for progression.    Dr. Bethann Punches is Medical Director for [facility] Cardiac Rehabilitation.  Dr. Vida Rigger is Medical Director for [facility] Pulmonary Rehabilitation.

## 2023-10-17 ENCOUNTER — Encounter: Payer: Medicare HMO | Admitting: *Deleted

## 2023-10-17 DIAGNOSIS — I213 ST elevation (STEMI) myocardial infarction of unspecified site: Secondary | ICD-10-CM | POA: Diagnosis not present

## 2023-10-17 NOTE — Progress Notes (Signed)
Daily Session Note  Patient Details  Name: Mark Mack MRN: [id number] Date of Birth: 1956-07-01 Referring Provider:   Flowsheet Row Cardiac Rehab from 10/02/2023 in [facility] Cardiac and Pulmonary Rehab  Referring Provider Dorothyann Peng, MD       Encounter Date: 10/17/2023  Check In:  Session Check In - 10/17/23 1010       Check-In   Supervising physician immediately available to respond to emergencies See telemetry face sheet for immediately available ER MD    Location [facility]-Cardiac & Pulmonary Rehab    Staff Present Cora Collum, RN, BSN, CCRP;Joseph Hood, RCP,RRT,BSRT;Noah Tickle, [place], Exercise Physiologist    Virtual Visit No    Medication changes reported     No    Fall or balance concerns reported    No    Warm-up and Cool-down Performed on first and last piece of equipment    Resistance Training Performed Yes    VAD Patient? No    PAD/SET Patient? No      Pain Assessment   Currently in Pain? No/denies                Social History   Tobacco Use  Smoking Status Former   Current packs/day: 0.00   Types: Cigarettes   Quit date: 03/23/2018   Years since quitting: 5.5  Smokeless Tobacco Never    Goals Met:  Independence with exercise equipment Exercise tolerated well No report of concerns or symptoms today  Goals Unmet:  Not Applicable  Comments: Pt able to follow exercise prescription today without complaint.  Will continue to monitor for progression.    Dr. Bethann Punches is Medical Director for [facility] Cardiac Rehabilitation.  Dr. Vida Rigger is Medical Director for [facility] Pulmonary Rehabilitation.

## 2023-10-24 ENCOUNTER — Encounter: Payer: Medicare HMO | Admitting: *Deleted

## 2023-10-24 DIAGNOSIS — I213 ST elevation (STEMI) myocardial infarction of unspecified site: Secondary | ICD-10-CM | POA: Diagnosis not present

## 2023-10-24 NOTE — Progress Notes (Signed)
Daily Session Note  Patient Details  Name: Mark Mack MRN: [id number] Date of Birth: April 04, 1956 Referring Provider:   Flowsheet Row Cardiac Rehab from 10/02/2023 in [facility] Cardiac and Pulmonary Rehab  Referring Provider Dorothyann Peng, MD       Encounter Date: 10/24/2023  Check In:  Session Check In - 10/24/23 1008       Check-In   Supervising physician immediately available to respond to emergencies See telemetry face sheet for immediately available ER MD    Location [facility]-Cardiac & Pulmonary Rehab    Staff Present Cora Collum, RN, BSN, CCRP;Joseph Hood, RCP,RRT,BSRT;Noah Tickle, [place], Exercise Physiologist    Virtual Visit No    Medication changes reported     No    Fall or balance concerns reported    No    Warm-up and Cool-down Performed on first and last piece of equipment    Resistance Training Performed Yes    VAD Patient? No    PAD/SET Patient? No      Pain Assessment   Currently in Pain? No/denies                Social History   Tobacco Use  Smoking Status Former   Current packs/day: 0.00   Types: Cigarettes   Quit date: 03/23/2018   Years since quitting: 5.5  Smokeless Tobacco Never    Goals Met:  Independence with exercise equipment Exercise tolerated well No report of concerns or symptoms today  Goals Unmet:  Not Applicable  Comments: Pt able to follow exercise prescription today without complaint.  Will continue to monitor for progression.    Dr. Bethann Punches is Medical Director for [facility] Cardiac Rehabilitation.  Dr. Vida Rigger is Medical Director for [facility] Pulmonary Rehabilitation.

## 2023-10-27 ENCOUNTER — Encounter: Payer: Medicare HMO | Admitting: *Deleted

## 2023-10-27 DIAGNOSIS — I213 ST elevation (STEMI) myocardial infarction of unspecified site: Secondary | ICD-10-CM

## 2023-10-27 NOTE — Progress Notes (Signed)
Daily Session Note  Patient Details  Name: Mark Mack MRN: [id number] Date of Birth: Apr 12, 1956 Referring Provider:   Flowsheet Row Cardiac Rehab from 10/02/2023 in [facility] Cardiac and Pulmonary Rehab  Referring Provider Dorothyann Peng, MD       Encounter Date: 10/27/2023  Check In:  Session Check In - 10/27/23 0955       Check-In   Supervising physician immediately available to respond to emergencies See telemetry face sheet for immediately available ER MD    Location [facility]-Cardiac & Pulmonary Rehab    Staff Present Ronette Deter, BS, Exercise Physiologist;Susanne Bice, RN, BSN, CCRP;Meredith Jewel Baize, RN Atilano Median, RN, ADN    Virtual Visit No    Medication changes reported     No    Fall or balance concerns reported    No    Warm-up and Cool-down Performed on first and last piece of equipment    Resistance Training Performed Yes    VAD Patient? No    PAD/SET Patient? No      Pain Assessment   Currently in Pain? No/denies                Social History   Tobacco Use  Smoking Status Former   Current packs/day: 0.00   Types: Cigarettes   Quit date: 03/23/2018   Years since quitting: 5.6  Smokeless Tobacco Never    Goals Met:  Independence with exercise equipment Exercise tolerated well No report of concerns or symptoms today Strength training completed today  Goals Unmet:  Not Applicable  Comments: Pt able to follow exercise prescription today without complaint.  Will continue to monitor for progression.    Dr. Bethann Punches is Medical Director for [facility] Cardiac Rehabilitation.  Dr. Vida Rigger is Medical Director for [facility] Pulmonary Rehabilitation.

## 2023-10-31 ENCOUNTER — Encounter: Payer: Medicare HMO | Attending: Internal Medicine | Admitting: *Deleted

## 2023-10-31 DIAGNOSIS — I213 ST elevation (STEMI) myocardial infarction of unspecified site: Secondary | ICD-10-CM | POA: Diagnosis present

## 2023-10-31 DIAGNOSIS — Z5189 Encounter for other specified aftercare: Secondary | ICD-10-CM | POA: Insufficient documentation

## 2023-10-31 DIAGNOSIS — I252 Old myocardial infarction: Secondary | ICD-10-CM | POA: Diagnosis not present

## 2023-10-31 NOTE — Progress Notes (Signed)
 Daily Session Note  Patient Details  Name: Parley Pidcock MRN: [id number] Date of Birth: [birth year] Referring Provider:   Flowsheet Row Cardiac Rehab from 10/02/2023 in [facility] Cardiac and Pulmonary Rehab  Referring Provider Florencio Kava, MD       Encounter Date: 10/31/2023  Check In:  Session Check In - 10/31/23 1122       Check-In   Supervising physician immediately available to respond to emergencies See telemetry face sheet for immediately available ER MD    Location [facility]-Cardiac & Pulmonary Rehab    Staff Present Othel Durand, RN, BSN, CCRP;Noah Tickle, BS, Exercise Physiologist;Maxon Conetta BS, , Exercise Physiologist    Virtual Visit No    Medication changes reported     No    Fall or balance concerns reported    No    Warm-up and Cool-down Performed on first and last piece of equipment    Resistance Training Performed Yes    VAD Patient? No    PAD/SET Patient? No      Pain Assessment   Currently in Pain? No/denies                Social History   Tobacco Use  Smoking Status Former   Current packs/day: 0.00   Types: Cigarettes   Quit date: 03/23/2018   Years since quitting: 5.6  Smokeless Tobacco Never    Goals Met:  Independence with exercise equipment Exercise tolerated well No report of concerns or symptoms today  Goals Unmet:  Not Applicable  Comments: Pt able to follow exercise prescription today without complaint.  Will continue to monitor for progression.    Dr. Oneil Pinal is Medical Director for [facility] Cardiac Rehabilitation.  Dr. Fuad Aleskerov is Medical Director for [facility] Pulmonary Rehabilitation.

## 2023-11-03 ENCOUNTER — Encounter: Payer: Medicare HMO | Admitting: *Deleted

## 2023-11-03 DIAGNOSIS — I252 Old myocardial infarction: Secondary | ICD-10-CM | POA: Diagnosis not present

## 2023-11-03 DIAGNOSIS — I213 ST elevation (STEMI) myocardial infarction of unspecified site: Secondary | ICD-10-CM

## 2023-11-03 NOTE — Progress Notes (Signed)
 Daily Session Note  Patient Details  Name: Mark Mack MRN: [id number] Date of Birth: 03-Aug-1956 Referring Provider:   Flowsheet Row Cardiac Rehab from 10/02/2023 in [facility] Cardiac and Pulmonary Rehab  Referring Provider Florencio Kava, MD       Encounter Date: 11/03/2023  Check In:  Session Check In - 11/03/23 1006       Check-In   Supervising physician immediately available to respond to emergencies See telemetry face sheet for immediately available ER MD    Location [facility]-Cardiac & Pulmonary Rehab    Staff Present Rollene Paterson, MS, Exercise Physiologist;Maxon Burnell HECKLE, , Exercise Physiologist;Kelly Dyane, BS, ACSM CEP, Exercise Physiologist;Lyle Niblett Claudene, RN, ADN    Virtual Visit No    Medication changes reported     No    Fall or balance concerns reported    No    Warm-up and Cool-down Performed on first and last piece of equipment    Resistance Training Performed Yes    VAD Patient? No    PAD/SET Patient? No      Pain Assessment   Currently in Pain? No/denies                Social History   Tobacco Use  Smoking Status Former   Current packs/day: 0.00   Types: Cigarettes   Quit date: 03/23/2018   Years since quitting: 5.6  Smokeless Tobacco Never    Goals Met:  Independence with exercise equipment Exercise tolerated well No report of concerns or symptoms today Strength training completed today  Goals Unmet:  Not Applicable  Comments: Pt able to follow exercise prescription today without complaint.  Will continue to monitor for progression.    Dr. Oneil Pinal is Medical Director for [facility] Cardiac Rehabilitation.  Dr. Fuad Aleskerov is Medical Director for [facility] Pulmonary Rehabilitation.

## 2023-11-05 ENCOUNTER — Encounter: Payer: Medicare HMO | Admitting: *Deleted

## 2023-11-05 DIAGNOSIS — I252 Old myocardial infarction: Secondary | ICD-10-CM | POA: Diagnosis not present

## 2023-11-05 DIAGNOSIS — I213 ST elevation (STEMI) myocardial infarction of unspecified site: Secondary | ICD-10-CM

## 2023-11-05 NOTE — Progress Notes (Signed)
 Daily Session Note  Patient Details  Name: Mark Mack MRN: [id number] Date of Birth: 1956-03-12 Referring Provider:   Flowsheet Row Cardiac Rehab from 10/02/2023 in [facility] Cardiac and Pulmonary Rehab  Referring Provider Florencio Kava, MD       Encounter Date: 11/05/2023  Check In:  Session Check In - 11/05/23 9077       Check-In   Supervising physician immediately available to respond to emergencies See telemetry face sheet for immediately available ER MD    Location [facility]-Cardiac & Pulmonary Rehab    Staff Present Devaughn Jaeger, BS, Exercise Physiologist;Susanne Bice, RN, BSN, CCRP;Joseph Petronila, [place] Arzella Claudene, RN, [place]    Virtual Visit No    Medication changes reported     No    Fall or balance concerns reported    No    Warm-up and Cool-down Performed on first and last piece of equipment    Resistance Training Performed Yes    VAD Patient? No    PAD/SET Patient? No      Pain Assessment   Currently in Pain? No/denies                Social History   Tobacco Use  Smoking Status Former   Current packs/day: 0.00   Types: Cigarettes   Quit date: 03/23/2018   Years since quitting: 5.6  Smokeless Tobacco Never    Goals Met:  Independence with exercise equipment Exercise tolerated well No report of concerns or symptoms today Strength training completed today  Goals Unmet:  Not Applicable  Comments: Pt able to follow exercise prescription today without complaint.  Will continue to monitor for progression.    Dr. Oneil Pinal is Medical Director for [facility] Cardiac Rehabilitation.  Dr. Fuad Aleskerov is Medical Director for [facility] Pulmonary Rehabilitation.

## 2023-11-07 ENCOUNTER — Encounter: Payer: Medicare HMO | Admitting: *Deleted

## 2023-11-07 DIAGNOSIS — I213 ST elevation (STEMI) myocardial infarction of unspecified site: Secondary | ICD-10-CM

## 2023-11-07 DIAGNOSIS — I252 Old myocardial infarction: Secondary | ICD-10-CM | POA: Diagnosis not present

## 2023-11-07 NOTE — Progress Notes (Signed)
 Daily Session Note  Patient Details  Name: Mark Mack MRN: [id number] Date of Birth: 1956-01-23 Referring Provider:   Flowsheet Row Cardiac Rehab from 10/02/2023 in [facility] Cardiac and Pulmonary Rehab  Referring Provider Florencio Kava, MD       Encounter Date: 11/07/2023  Check In:  Session Check In - 11/07/23 0951       Check-In   Supervising physician immediately available to respond to emergencies See telemetry face sheet for immediately available ER MD    Location [facility]-Cardiac & Pulmonary Rehab    Staff Present Othel Durand, RN, BSN, CCRP;Noah Tickle, BS, Exercise Physiologist;Joseph [place], [place]    Virtual Visit No    Medication changes reported     No    Fall or balance concerns reported    No    Warm-up and Cool-down Performed on first and last piece of equipment    Resistance Training Performed Yes    VAD Patient? No    PAD/SET Patient? No      Pain Assessment   Currently in Pain? No/denies                Social History   Tobacco Use  Smoking Status Former   Current packs/day: 0.00   Types: Cigarettes   Quit date: 03/23/2018   Years since quitting: 5.6  Smokeless Tobacco Never    Goals Met:  Independence with exercise equipment Exercise tolerated well No report of concerns or symptoms today  Goals Unmet:  Not Applicable  Comments: Pt able to follow exercise prescription today without complaint.  Will continue to monitor for progression.    Dr. Oneil Pinal is Medical Director for [facility] Cardiac Rehabilitation.  Dr. Fuad Aleskerov is Medical Director for [facility] Pulmonary Rehabilitation.

## 2023-11-10 ENCOUNTER — Encounter: Payer: Medicare HMO | Admitting: *Deleted

## 2023-11-10 DIAGNOSIS — I213 ST elevation (STEMI) myocardial infarction of unspecified site: Secondary | ICD-10-CM

## 2023-11-10 DIAGNOSIS — I252 Old myocardial infarction: Secondary | ICD-10-CM | POA: Diagnosis not present

## 2023-11-10 NOTE — Progress Notes (Signed)
 Daily Session Note  Patient Details  Name: Wilber Fini MRN: [id number] Date of Birth: 1956-10-04 Referring Provider:   Flowsheet Row Cardiac Rehab from 10/02/2023 in [facility] Cardiac and Pulmonary Rehab  Referring Provider Florencio Kava, MD       Encounter Date: 11/10/2023  Check In:  Session Check In - 11/10/23 0949       Check-In   Supervising physician immediately available to respond to emergencies See telemetry face sheet for immediately available ER MD    Location [facility]-Cardiac & Pulmonary Rehab    Staff Present Maxon Conetta BS, , Exercise Physiologist;Alvita Fana Dyane, BS, ACSM CEP, Exercise Physiologist;Margaret Best, MS, Exercise Physiologist;Joseph Whatley, RCP,RRT,BSRT;Other   Burnard Davenport, RN   Virtual Visit No    Medication changes reported     No    Fall or balance concerns reported    No    Tobacco Cessation No Change    Warm-up and Cool-down Performed on first and last piece of equipment    Resistance Training Performed Yes    VAD Patient? No    PAD/SET Patient? No      Pain Assessment   Currently in Pain? No/denies                Social History   Tobacco Use  Smoking Status Former   Current packs/day: 0.00   Types: Cigarettes   Quit date: 03/23/2018   Years since quitting: 5.6  Smokeless Tobacco Never    Goals Met:  Independence with exercise equipment Exercise tolerated well No report of concerns or symptoms today Strength training completed today  Goals Unmet:  Not Applicable  Comments: Pt able to follow exercise prescription today without complaint. Will continue to monitor for progression.    Dr. Oneil Pinal is Medical Director for [facility] Cardiac Rehabilitation.  Dr. Fuad Aleskerov is Medical Director for [facility] Pulmonary Rehabilitation.

## 2023-11-12 ENCOUNTER — Encounter: Payer: Medicare HMO | Admitting: *Deleted

## 2023-11-12 DIAGNOSIS — I252 Old myocardial infarction: Secondary | ICD-10-CM | POA: Diagnosis not present

## 2023-11-12 DIAGNOSIS — I213 ST elevation (STEMI) myocardial infarction of unspecified site: Secondary | ICD-10-CM

## 2023-11-12 NOTE — Progress Notes (Signed)
 Cardiac Individual Treatment Plan  Patient Details  Name: Mark Mack MRN: [id number] Date of Birth: August 27, 1956 Referring Provider:   Flowsheet Row Cardiac Rehab from 10/02/2023 in [facility] Cardiac and Pulmonary Rehab  Referring Provider Burney Carter, MD       Initial Encounter Date:  Flowsheet Row Cardiac Rehab from 10/02/2023 in [facility] Cardiac and Pulmonary Rehab  Date 10/02/23       Visit Diagnosis: ST elevation myocardial infarction (STEMI), unspecified artery (HCC)  Patient's Home Medications on Admission:  Current Outpatient Medications:    acetaminophen  (TYLENOL ) 325 MG tablet, Take 1-2 tablets (325-650 mg total) by mouth every 4 (four) hours as needed for mild pain (pain score 1-3). (Patient not taking: Reported on 10/01/2023), Disp: , Rfl:    aspirin  EC 81 MG tablet, Take 1 tablet (81 mg total) by mouth daily. Swallow whole., Disp: 100 tablet, Rfl: 0   atorvastatin  (LIPITOR) 40 MG tablet, Take 1 tablet (40 mg total) by mouth daily., Disp: 30 tablet, Rfl: 0   atorvastatin  (LIPITOR) 40 MG tablet, Take 1 tablet by mouth daily. (Patient not taking: Reported on 10/01/2023), Disp: , Rfl:    clopidogrel  (PLAVIX ) 75 MG tablet, Take 1 tablet (75 mg total) by mouth daily. (Patient not taking: Reported on 10/01/2023), Disp: 30 tablet, Rfl: 0   clopidogrel  (PLAVIX ) 75 MG tablet, Take 1 tablet by mouth daily. (Patient not taking: Reported on 10/01/2023), Disp: , Rfl:    L-METHYLFOLATE CALCIUM  PO, Take 1 tablet by mouth daily. (Patient not taking: Reported on 10/01/2023), Disp: , Rfl:    l-methylfolate-B6-B12 (METANX) 3-35-2 MG TABS tablet, Take 1 tablet by mouth daily., Disp: 60 tablet, Rfl: 0   MAGnesium -Oxide 400 (240 Mg) MG tablet, Take 0.5 tablets (200 mg total) by mouth at bedtime., Disp: 30 tablet, Rfl: 0   metoprolol  succinate (TOPROL -XL) 25 MG 24 hr tablet, Take 0.5 tablets (12.5 mg total) by mouth daily., Disp: 30 tablet, Rfl: 0   Multiple Vitamin (MULTIVITAMIN WITH MINERALS) TABS tablet,  Take 1 tablet by mouth daily. (Patient not taking: Reported on 10/01/2023), Disp: , Rfl:    naltrexone (DEPADE) 50 MG tablet, Take by mouth. (Patient not taking: Reported on 10/01/2023), Disp: , Rfl:    nitroGLYCERIN  (NITROSTAT ) 0.4 MG SL tablet, Place 1 tablet (0.4 mg total) under the tongue every 5 (five) minutes as needed for chest pain. Can use up to three times as needed. You need to call MD if you need to use two or more pills., Disp: 30 tablet, Rfl: 0   pantoprazole  (PROTONIX ) 40 MG tablet, Take 1 tablet (40 mg total) by mouth at bedtime., Disp: 30 tablet, Rfl: 0   pantoprazole  (PROTONIX ) 40 MG tablet, Take by mouth. (Patient not taking: Reported on 10/01/2023), Disp: , Rfl:    silver  sulfADIAZINE  (SILVADENE ) 1 % cream, Apply topically 2 (two) times daily. (Patient not taking: Reported on 10/01/2023), Disp: 50 g, Rfl: 0   Telmisartan-amLODIPine 80-10 MG TABS, SMARTSIG:1.0 Tablet(s) By Mouth Daily (Patient not taking: Reported on 10/01/2023), Disp: , Rfl:    thiamine  (VITAMIN B1) 100 MG tablet, Take 1 tablet (100 mg total) by mouth daily. (Patient not taking: Reported on 10/01/2023), Disp: 30 tablet, Rfl: 0   traZODone  (DESYREL ) 50 MG tablet, Take 1 tablet (50 mg total) by mouth at bedtime. (Patient not taking: Reported on 10/01/2023), Disp: 30 tablet, Rfl: 0   traZODone  (DESYREL ) 50 MG tablet, Take by mouth. (Patient not taking: Reported on 10/01/2023), Disp: , Rfl:    vitamin D3 (CHOLECALCIFEROL )  25 MCG tablet, Take 3 tablets (3,000 Units total) by mouth daily., Disp: 90 tablet, Rfl: 0  Past Medical History: Past Medical History:  Diagnosis Date   CAD (coronary artery disease)    Hypertension    OA (osteoarthritis) of hip    bilateral hips   Tobacco use     Tobacco Use: Social History   Tobacco Use  Smoking Status Former   Current packs/day: 0.00   Types: Cigarettes   Quit date: 03/23/2018   Years since quitting: 5.6  Smokeless Tobacco Never    Labs: Review Flowsheet       Latest  Ref Rng & Units 08/31/2023 09/01/2023  Labs for ITP Cardiac and Pulmonary Rehab  Cholestrol 0 - 200 mg/dL 478  -  LDL (calc) 0 - 99 mg/dL 57  -  HDL-C >29 mg/dL 62  -  Trlycerides <562 mg/dL 130  72   Hemoglobin Q6V 4.8 - 5.6 % 5.8  -  PH, Arterial 7.35 - 7.45 7.08  7.12  7.06  7.08  6.867  7.46  7.39  7.27  7.2   PCO2 arterial 32 - 48 mmHg 51  48  56  56  75.8  40  48  52  54   Bicarbonate 20.0 - 28.0 mmol/L 15.1  15.6  15.9  16.9  16.6  15.0  13.8  28.4  29.1  23.9  21.1   TCO2 22 - 32 mmol/L 22 - 32 mmol/L 18  16  -  Acid-base deficit 0.0 - 2.0 mmol/L 14.9  13.6  14.6  15.0  13.8  20.0  20.0  3.6  7.4   O2 Saturation % 88.9  96.6  71.4  97.1  74.4  78.4  33  69  93.5  91.3  91.8  98     Details       Multiple values from one day are sorted in reverse-chronological order          Exercise Target Goals: Exercise Program Goal: Individual exercise prescription set using results from initial 6 min walk test and THRR while considering  patient's activity barriers and safety.   Exercise Prescription Goal: Initial exercise prescription builds to 30-45 minutes a day of aerobic activity, 2-3 days per week.  Home exercise guidelines will be given to patient during program as part of exercise prescription that the participant will acknowledge.   Education: Aerobic Exercise: - Group verbal and visual presentation on the components of exercise prescription. Introduces F.I.T.T principle from ACSM for exercise prescriptions.  Reviews F.I.T.T. principles of aerobic exercise including progression. Written material given at graduation. Flowsheet Row Cardiac Rehab from 08/05/2018 in [facility] Cardiac and Pulmonary Rehab  Date 06/01/18 (P)   Educator JH (P)   Instruction Review Code 1- Verbalizes Understanding (P)        Education: Resistance Exercise: - Group verbal and visual presentation on the components of exercise prescription. Introduces F.I.T.T principle from ACSM for exercise prescriptions   Reviews F.I.T.T. principles of resistance exercise including progression. Written material given at graduation. Flowsheet Row Cardiac Rehab from 11/12/2023 in [facility] Cardiac and Pulmonary Rehab  Education need identified 10/08/23        Education: Exercise & Equipment Safety: - Individual verbal instruction and demonstration of equipment use and safety with use of the equipment. Flowsheet Row Cardiac Rehab from 10/08/2023 in [facility] Cardiac and Pulmonary Rehab  Date 10/02/23  Educator MB  Instruction Review Code 1- [employer] Understanding       Education:  Exercise Physiology & General Exercise Guidelines: - Group verbal and written instruction with models to review the exercise physiology of the cardiovascular system and associated critical values. Provides general exercise guidelines with specific guidelines to those with heart or lung disease.  Flowsheet Row Cardiac Rehab from 11/12/2023 in [facility] Cardiac and Pulmonary Rehab  Education need identified 10/08/23       Education: Flexibility, Balance, Mind/Body Relaxation: - Group verbal and visual presentation with interactive activity on the components of exercise prescription. Introduces F.I.T.T principle from ACSM for exercise prescriptions. Reviews F.I.T.T. principles of flexibility and balance exercise training including progression. Also discusses the mind body connection.  Reviews various relaxation techniques to help reduce and manage stress (i.e. Deep breathing, progressive muscle relaxation, and visualization). Balance handout provided to take home. Written material given at graduation. Flowsheet Row Cardiac Rehab from 08/05/2018 in [facility] Cardiac and Pulmonary Rehab  Date 08/03/18 (P)   Educator KH (P)   Instruction Review Code 1- Verbalizes Understanding (P)        Activity Barriers & Risk Stratification:  Activity Barriers & Cardiac Risk Stratification - 10/02/23 1616       Activity Barriers & Cardiac Risk Stratification    Activity Barriers None    Cardiac Risk Stratification High             6 Minute Walk:  6 Minute Walk     Row Name 10/02/23 1614         6 Minute Walk   Phase Initial     Distance 1615 feet     Walk Time 6 minutes     # of Rest Breaks 0     MPH 3.1     METS 4.51     RPE 13     Perceived Dyspnea  0     VO2 Peak 15.77     Symptoms No     Resting HR 67 bpm     Resting BP 166/86     Resting Oxygen Saturation  97 %     Exercise Oxygen Saturation  during 6 min walk 100 %     Max Ex. HR 115 bpm     Max Ex. BP 184/72     2 Minute Post BP 158/84              Oxygen Initial Assessment:   Oxygen Re-Evaluation:   Oxygen Discharge (Final Oxygen Re-Evaluation):   Initial Exercise Prescription:  Initial Exercise Prescription - 10/02/23 1600       Date of Initial Exercise RX and Referring Provider   Date 10/02/23    Referring Provider Burney Carter, MD      Oxygen   Maintain Oxygen Saturation 88% or higher      Treadmill   MPH 2.5    Grade 1    Minutes 15    METs 3.26      Recumbant Bike   Level 4    RPM 50    Watts 52    Minutes 15    METs 4.51      NuStep   Level 4    SPM 80    Minutes 15    METs 4.51      Elliptical   Level 1    Speed 3    Minutes 15    METs 4.51      REL-XR   Level 3    Speed 50    Minutes 15    METs 4.51  Prescription Details   Frequency (times per week) 3    Duration Progress to 30 minutes of continuous aerobic without signs/symptoms of physical distress      Intensity   THRR 40-80% of Max Heartrate 101-136    Ratings of Perceived Exertion 11-13    Perceived Dyspnea 0-4      Progression   Progression Continue to progress workloads to maintain intensity without signs/symptoms of physical distress.      Resistance Training   Training Prescription Yes    Weight 7 lb    Reps 10-15             Perform Capillary Blood Glucose checks as needed.  Exercise Prescription Changes:   Exercise  Prescription Changes     Row Name 10/02/23 1600 10/16/23 1600 10/28/23 1500         Response to Exercise   Blood Pressure (Admit) 166/86 108/62 110/66     Blood Pressure (Exercise) 184/72 162/70 164/78     Blood Pressure (Exit) 158/84 112/60 108/60     Heart Rate (Admit) 67 bpm 72 bpm 85 bpm     Heart Rate (Exercise) 115 bpm 130 bpm 139 bpm     Heart Rate (Exit) 65 bpm 91 bpm 72 bpm     Oxygen Saturation (Admit) 97 % -- --     Oxygen Saturation (Exercise) 100 % -- --     Oxygen Saturation (Exit) 97 % -- --     Rating of Perceived Exertion (Exercise) 13 13 14      Perceived Dyspnea (Exercise) 0 0 0     Symptoms none none none     Comments results -- --     Duration Progress to 30 minutes of  aerobic without signs/symptoms of physical distress Progress to 30 minutes of  aerobic without signs/symptoms of physical distress Progress to 30 minutes of  aerobic without signs/symptoms of physical distress     Intensity THRR New THRR New THRR New       Progression   Progression Continue to progress workloads to maintain intensity without signs/symptoms of physical distress. Continue to progress workloads to maintain intensity without signs/symptoms of physical distress. Continue to progress workloads to maintain intensity without signs/symptoms of physical distress.     Average METs 4.51 3.43 3.2       Resistance Training   Training Prescription -- Yes Yes     Weight -- 7 lb 7 lb     Reps -- 10-15 10-15       Interval Training   Interval Training -- No No       Treadmill   MPH -- 2.7 2.7     Grade -- 0 0     Minutes -- 15 15     METs -- 3.07 3.07       Recumbant Bike   Level -- -- 5     Watts -- -- 37     Minutes -- -- 15     METs -- -- 3.77       NuStep   Level -- 4 3     Minutes -- 15 15     METs -- 4.3 2.2       Elliptical   Level -- 1 --     Speed -- 3.1 --     Minutes -- 15 --       REL-XR   Level -- -- 4     Minutes -- -- 15  METs -- -- 5       T5  Nustep   Level -- -- 4     Minutes -- -- 15     METs -- -- 2.1       Oxygen   Maintain Oxygen Saturation -- 88% or higher 88% or higher              Exercise Comments:   Exercise Comments     Row Name 10/08/23 0932           Exercise Comments First full day of exercise!  Patient was oriented to gym and equipment including functions, settings, policies, and procedures.  Patient's individual exercise prescription and treatment plan were reviewed.  All starting workloads were established based on the results of the 6 minute walk test done at initial orientation visit.  The plan for exercise progression was also introduced and progression will be customized based on patient's performance and goals.                Exercise Goals and Review:   Exercise Goals     Row Name 10/02/23 1621             Exercise Goals   Increase Physical Activity Yes       Intervention Provide advice, education, support and counseling about physical activity/exercise needs.;Develop an individualized exercise prescription for aerobic and resistive training based on initial evaluation findings, risk stratification, comorbidities and participant's personal goals.       Expected Outcomes Short Term: Attend rehab on a regular basis to increase amount of physical activity.;Long Term: Add in home exercise to make exercise part of routine and to increase amount of physical activity.;Long Term: Exercising regularly at least 3-5 days a week.       Increase Strength and Stamina Yes       Intervention Provide advice, education, support and counseling about physical activity/exercise needs.;Develop an individualized exercise prescription for aerobic and resistive training based on initial evaluation findings, risk stratification, comorbidities and participant's personal goals.       Expected Outcomes Short Term: Increase workloads from initial exercise prescription for resistance, speed, and METs.;Short Term:  Perform resistance training exercises routinely during rehab and add in resistance training at home;Long Term: Improve cardiorespiratory fitness, muscular endurance and strength as measured by increased METs and functional capacity ( )       Able to understand and use rate of perceived exertion (RPE) scale Yes       Intervention Provide education and explanation on how to use RPE scale       Expected Outcomes Short Term: Able to use RPE daily in rehab to express subjective intensity level;Long Term:  Able to use RPE to guide intensity level when exercising independently       Able to understand and use Dyspnea scale Yes       Intervention Provide education and explanation on how to use Dyspnea scale       Expected Outcomes Short Term: Able to use Dyspnea scale daily in rehab to express subjective sense of shortness of breath during exertion;Long Term: Able to use Dyspnea scale to guide intensity level when exercising independently       Knowledge and understanding of Target Heart Rate Range (THRR) Yes       Intervention Provide education and explanation of THRR including how the numbers were predicted and where they are located for reference       Expected Outcomes Short Term: Able to  state/look up THRR;Short Term: Able to use daily as guideline for intensity in rehab;Long Term: Able to use THRR to govern intensity when exercising independently       Able to check pulse independently Yes       Intervention Provide education and demonstration on how to check pulse in carotid and radial arteries.;Review the importance of being able to check your own pulse for safety during independent exercise       Expected Outcomes Short Term: Able to explain why pulse checking is important during independent exercise;Long Term: Able to check pulse independently and accurately       Understanding of Exercise Prescription Yes       Intervention Provide education, explanation, and written materials on patient's  individual exercise prescription       Expected Outcomes Short Term: Able to explain program exercise prescription;Long Term: Able to explain home exercise prescription to exercise independently                Exercise Goals Re-Evaluation :  Exercise Goals Re-Evaluation     Row Name 10/08/23 0932 10/16/23 1633 10/28/23 1526         Exercise Goal Re-Evaluation   Exercise Goals Review Able to understand and use rate of perceived exertion (RPE) scale;Able to understand and use Dyspnea scale;Knowledge and understanding of Target Heart Rate Range (THRR);Understanding of Exercise Prescription Increase Physical Activity;Increase Strength and Stamina;Understanding of Exercise Prescription Increase Physical Activity;Increase Strength and Stamina;Understanding of Exercise Prescription     Comments Reviewed RPE and dyspnea scale, THR and program prescription with pt today.  Pt voiced understanding and was given a copy of goals to take home. Mark Mack is off to a good start in the program. He was able to maintain his prescribed speed on the treadmill, as well as his prescribed level on the T4 nustep. We will continue to monitor his progress in the program. Mark Mack is doing well in the program. He was recently able to increase his level on the XR from level 3 to 4. He was also able to increase to level 5 on the recumbent bike. He has maintained his speed on the treadmill, and his level on the T4 nustep. We will continue to monitor his progress in the program.     Expected Outcomes Short: Use RPE daily to regulate intensity. Long: Follow program prescription in THR. Short: Continue to follow current exercise prescription. Long: Continue exercise to improve strength and stamina. Short: Continue to follow current exercise prescription. Long: Continue exercise to improve strength and stamina.              Discharge Exercise Prescription (Final Exercise Prescription Changes):  Exercise Prescription Changes -  10/28/23 1500       Response to Exercise   Blood Pressure (Admit) 110/66    Blood Pressure (Exercise) 164/78    Blood Pressure (Exit) 108/60    Heart Rate (Admit) 85 bpm    Heart Rate (Exercise) 139 bpm    Heart Rate (Exit) 72 bpm    Rating of Perceived Exertion (Exercise) 14    Perceived Dyspnea (Exercise) 0    Symptoms none    Duration Progress to 30 minutes of  aerobic without signs/symptoms of physical distress    Intensity THRR New      Progression   Progression Continue to progress workloads to maintain intensity without signs/symptoms of physical distress.    Average METs 3.2      Resistance Training   Training Prescription Yes  Weight 7 lb    Reps 10-15      Interval Training   Interval Training No      Treadmill   MPH 2.7    Grade 0    Minutes 15    METs 3.07      Recumbant Bike   Level 5    Watts 37    Minutes 15    METs 3.77      NuStep   Level 3    Minutes 15    METs 2.2      REL-XR   Level 4    Minutes 15    METs 5      T5 Nustep   Level 4    Minutes 15    METs 2.1      Oxygen   Maintain Oxygen Saturation 88% or higher             Nutrition:  Target Goals: Understanding of nutrition guidelines, daily intake of sodium 1500mg , cholesterol 200mg , calories 30% from fat and 7% or less from saturated fats, daily to have 5 or more servings of fruits and vegetables.  Education: All About Nutrition: -Group instruction provided by verbal, written material, interactive activities, discussions, models, and posters to present general guidelines for heart healthy nutrition including fat, fiber, MyPlate, the role of sodium in heart healthy nutrition, utilization of the nutrition label, and utilization of this knowledge for meal planning. Follow up email sent as well. Written material given at graduation. Flowsheet Row Cardiac Rehab from 11/12/2023 in [facility] Cardiac and Pulmonary Rehab  Education need identified 10/08/23  Date 11/12/23  Educator  JG  Instruction Review Code 1- Verbalizes Understanding       Biometrics:  Pre Biometrics - 10/02/23 1621       Pre Biometrics   Height 5' 6.4" (1.687 m)    Weight 144 lb 8 oz (65.5 kg)    Waist Circumference 35 inches    Hip Circumference 36 inches    Waist to Hip Ratio 0.97 %    BMI (Calculated) 23.03    Single Leg Stand 30 seconds              Nutrition Therapy Plan and Nutrition Goals:  Nutrition Therapy & Goals - 10/02/23 1622       Nutrition Therapy   RD appointment deferred Yes      Personal Nutrition Goals   Nutrition Goal RD appointment deferred at this time.      Intervention Plan   Intervention Prescribe, educate and counsel regarding individualized specific dietary modifications aiming towards targeted core components such as weight, hypertension, lipid management, diabetes, heart failure and other comorbidities.;Nutrition handout(s) given to patient.    Expected Outcomes Short Term Goal: Understand basic principles of dietary content, such as calories, fat, sodium, cholesterol and nutrients.;Short Term Goal: A plan has been developed with personal nutrition goals set during dietitian appointment.;Long Term Goal: Adherence to prescribed nutrition plan.             Nutrition Assessments:  MEDIFICTS Score Key: >=70 Need to make dietary changes  40-70 Heart Healthy Diet <= 40 Therapeutic Level Cholesterol Diet  Flowsheet Row Cardiac Rehab from 10/08/2023 in [facility] Cardiac and Pulmonary Rehab  Picture Your Plate Total Score on Admission 43      Picture Your Plate Scores: <09 Unhealthy dietary pattern with much room for improvement. 41-50 Dietary pattern unlikely to meet recommendations for good health and room for improvement. 51-60 More healthful dietary pattern,  with some room for improvement.  >60 Healthy dietary pattern, although there may be some specific behaviors that could be improved.    Nutrition Goals Re-Evaluation:  Nutrition Goals  Re-Evaluation     Row Name 10/24/23 0926             Goals   Nutrition Goal RD appointment deferred at this time.                Nutrition Goals Discharge (Final Nutrition Goals Re-Evaluation):  Nutrition Goals Re-Evaluation - 10/24/23 0926       Goals   Nutrition Goal RD appointment deferred at this time.             Psychosocial: Target Goals: Acknowledge presence or absence of significant depression and/or stress, maximize coping skills, provide positive support system. Participant is able to verbalize types and ability to use techniques and skills needed for reducing stress and depression.   Education: Stress, Anxiety, and Depression - Group verbal and visual presentation to define topics covered.  Reviews how body is impacted by stress, anxiety, and depression.  Also discusses healthy ways to reduce stress and to treat/manage anxiety and depression.  Written material given at graduation. Flowsheet Row Cardiac Rehab from 11/12/2023 in [facility] Cardiac and Pulmonary Rehab  Education need identified 10/08/23       Education: Sleep Hygiene -Provides group verbal and written instruction about how sleep can affect your health.  Define sleep hygiene, discuss sleep cycles and impact of sleep habits. Review good sleep hygiene tips.    Initial Review & Psychosocial Screening:  Initial Psych Review & Screening - 10/01/23 1344       Initial Review   Current issues with Current Stress Concerns    Source of Stress Concerns Chronic Illness      Family Dynamics   Good Support System? Yes    Comments He can look to his wife for support. He has been through the program before and has had some issues with his lifestyle. He is ready to get his health back on track.      Barriers   Psychosocial barriers to participate in program The patient should benefit from training in stress management and relaxation.      Screening Interventions   Interventions Program counselor  consult;To provide support and resources with identified psychosocial needs;Encouraged to exercise;Provide feedback about the scores to participant    Expected Outcomes Short Term goal: Utilizing psychosocial counselor, staff and physician to assist with identification of specific Stressors or current issues interfering with healing process. Setting desired goal for each stressor or current issue identified.;Long Term Goal: Stressors or current issues are controlled or eliminated.;Short Term goal: Identification and review with participant of any Quality of Life or Depression concerns found by scoring the questionnaire.;Long Term goal: The participant improves quality of Life and PHQ9 Scores as seen by post scores and/or verbalization of changes             Quality of Life Scores:   Scores of 19 and below usually indicate a poorer quality of life in these areas.  A difference of  2-3 points is a clinically meaningful difference.  A difference of 2-3 points in the total score of the Quality of Life Index has been associated with significant improvement in overall quality of life, self-image, physical symptoms, and general health in studies assessing change in quality of life.  PHQ-9: Review Flowsheet       10/02/2023 08/06/2018 05/28/2018  Depression screen  PHQ 2/9  Decreased Interest 0 0 2  Down, Depressed, Hopeless 0 0 0  PHQ - 2 Score 0 0 2  Altered sleeping 0 0 0  Tired, decreased energy 0 0 0  Change in appetite 0 0 1  Feeling bad or failure about yourself  1 0 0  Trouble concentrating 0 0 0  Moving slowly or fidgety/restless 0 0 0  Suicidal thoughts 0 0 0  PHQ-9 Score 1 0 3  Difficult doing work/chores Not difficult at all Not difficult at all Not difficult at all   Interpretation of Total Score  Total Score Depression Severity:  1-4 = Minimal depression, 5-9 = Mild depression, 10-14 = Moderate depression, 15-19 = Moderately severe depression, 20-27 = Severe depression    Psychosocial Evaluation and Intervention:  Psychosocial Evaluation - 10/01/23 1348       Psychosocial Evaluation & Interventions   Interventions Relaxation education;Stress management education;Encouraged to exercise with the program and follow exercise prescription    Comments He can look to his wife for support. He has been through the program before and has had some issues with his lifestyle. He is ready to get his health back on track.    Expected Outcomes Short: Start [facility] to help with mood. Long: Maintain a healthy mental state    Continue Psychosocial Services  Follow up required by staff             Psychosocial Re-Evaluation:  Psychosocial Re-Evaluation     Row Name 10/24/23 [phone]             Psychosocial Re-Evaluation   Current issues with None Identified       Comments Patient reports no issues with their current mental states, sleep, stress, depression or anxiety. Will follow up with patient in a few weeks for any changes.       Expected Outcomes Short: Continue to exercise regularly to support mental health and notify staff of any changes. Long: maintain mental health and well being through teaching of rehab or prescribed medications independently.       Interventions Encouraged to attend Cardiac Rehabilitation for the exercise       Continue Psychosocial Services  Follow up required by staff                Psychosocial Discharge (Final Psychosocial Re-Evaluation):  Psychosocial Re-Evaluation - 10/24/23 0927       Psychosocial Re-Evaluation   Current issues with None Identified    Comments Patient reports no issues with their current mental states, sleep, stress, depression or anxiety. Will follow up with patient in a few weeks for any changes.    Expected Outcomes Short: Continue to exercise regularly to support mental health and notify staff of any changes. Long: maintain mental health and well being through teaching of rehab or prescribed  medications independently.    Interventions Encouraged to attend Cardiac Rehabilitation for the exercise    Continue Psychosocial Services  Follow up required by staff             Vocational Rehabilitation: Provide vocational rehab assistance to qualifying candidates.   Vocational Rehab Evaluation & Intervention:   Education: Education Goals: Education classes will be provided on a variety of topics geared toward better understanding of heart health and risk factor modification. Participant will state understanding/return demonstration of topics presented as noted by education test scores.  Learning Barriers/Preferences:  Learning Barriers/Preferences - 10/01/23 1343       Learning Barriers/Preferences  Learning Barriers None    Learning Preferences Computer/Internet;Verbal Instruction;Group Instruction;Skilled Demonstration;Video             General Cardiac Education Topics:  AED/CPR: - Group verbal and written instruction with the use of models to demonstrate the basic use of the AED with the basic ABC's of resuscitation. Flowsheet Row Cardiac Rehab from 08/05/2018 in [facility] Cardiac and Pulmonary Rehab  Date 07/13/18 (P)   Educator CE (P)   Instruction Review Code 1- Verbalizes Understanding (P)        Anatomy and Cardiac Procedures: - Group verbal and visual presentation and models provide information about basic cardiac anatomy and function. Reviews the testing methods done to diagnose heart disease and the outcomes of the test results. Describes the treatment choices: Medical Management, Angioplasty, or Coronary Bypass Surgery for treating various heart conditions including Myocardial Infarction, Angina, Valve Disease, and Cardiac Arrhythmias.  Written material given at graduation. Flowsheet Row Cardiac Rehab from 11/12/2023 in [facility] Cardiac and Pulmonary Rehab  Education need identified 10/08/23       Medication Safety: - Group verbal and visual instruction to  review commonly prescribed medications for heart and lung disease. Reviews the medication, class of the drug, and side effects. Includes the steps to properly store meds and maintain the prescription regimen.  Written material given at graduation. Flowsheet Row Cardiac Rehab from 08/05/2018 in [facility] Cardiac and Pulmonary Rehab  Date 06/22/18 (P)   Educator CE (P)   Instruction Review Code 1- Verbalizes Understanding (P)        Intimacy: - Group verbal instruction through game format to discuss how heart and lung disease can affect sexual intimacy. Written material given at graduation.. Flowsheet Row Cardiac Rehab from 11/12/2023 in [facility] Cardiac and Pulmonary Rehab  Education need identified 10/08/23       Know Your Numbers and Heart Failure: - Group verbal and visual instruction to discuss disease risk factors for cardiac and pulmonary disease and treatment options.  Reviews associated critical values for Overweight/Obesity, Hypertension, Cholesterol, and Diabetes.  Discusses basics of heart failure: signs/symptoms and treatments.  Introduces Heart Failure Zone chart for action plan for heart failure.  Written material given at graduation. Flowsheet Row Cardiac Rehab from 11/12/2023 in [facility] Cardiac and Pulmonary Rehab  Education need identified 10/08/23       Infection Prevention: - Provides verbal and written material to individual with discussion of infection control including proper hand washing and proper equipment cleaning during exercise session. Flowsheet Row Cardiac Rehab from 10/08/2023 in [facility] Cardiac and Pulmonary Rehab  Date 10/02/23  Educator MB  Instruction Review Code 1- Verbalizes Understanding       Falls Prevention: - Provides verbal and written material to individual with discussion of falls prevention and safety. Flowsheet Row Cardiac Rehab from 10/08/2023 in [facility] Cardiac and Pulmonary Rehab  Date 10/02/23  Educator MB  Instruction Review Code 1- Verbalizes  Understanding       Other: -Provides group and verbal instruction on various topics (see comments)   Knowledge Questionnaire Score:  Knowledge Questionnaire Score - 10/08/23 1205       Knowledge Questionnaire Score   Pre Score 18/26             Core Components/Risk Factors/Patient Goals at Admission:  Personal Goals and Risk Factors at Admission - 10/02/23 1623       Core Components/Risk Factors/Patient Goals on Admission    Weight Management Yes;Weight Maintenance    Intervention Weight Management: Develop a combined nutrition  and exercise program designed to reach desired caloric intake, while maintaining appropriate intake of nutrient and fiber, sodium and fats, and appropriate energy expenditure required for the weight goal.;Weight Management: Provide education and appropriate resources to help participant work on and attain dietary goals.;Weight Management/Obesity: Establish reasonable short term and long term weight goals.    Admit Weight 144 lb 8 oz (65.5 kg)    Goal Weight: Short Term 144 lb 8 oz (65.5 kg)    Goal Weight: Long Term 144 lb 8 oz (65.5 kg)    Expected Outcomes Long Term: Adherence to nutrition and physical activity/exercise program aimed toward attainment of established weight goal;Weight Maintenance: Understanding of the daily nutrition guidelines, which includes 25-35% calories from fat, 7% or less cal from saturated fats, less than 200mg  cholesterol, less than 1.5gm of sodium, & 5 or more servings of fruits and vegetables daily;Short Term: Continue to assess and modify interventions until short term weight is achieved;Understanding recommendations for meals to include 15-35% energy as protein, 25-35% energy from fat, 35-60% energy from carbohydrates, less than 200mg  of dietary cholesterol, 20-35 gm of total fiber daily;Understanding of distribution of calorie intake throughout the day with the consumption of 4-5 meals/snacks    Hypertension Yes     Intervention Provide education on lifestyle modifcations including regular physical activity/exercise, weight management, moderate sodium restriction and increased consumption of fresh fruit, vegetables, and low fat dairy, alcohol moderation, and smoking cessation.;Monitor prescription use compliance.    Expected Outcomes Long Term: Maintenance of blood pressure at goal levels.;Short Term: Continued assessment and intervention until BP is < 140/80mm HG in hypertensive participants. < 130/69mm HG in hypertensive participants with diabetes, heart failure or chronic kidney disease.    Lipids Yes    Intervention Provide education and support for participant on nutrition & aerobic/resistive exercise along with prescribed medications to achieve LDL 70mg , HDL >40mg .    Expected Outcomes Short Term: Participant states understanding of desired cholesterol values and is compliant with medications prescribed. Participant is following exercise prescription and nutrition guidelines.;Long Term: Cholesterol controlled with medications as prescribed, with individualized exercise RX and with personalized nutrition plan. Value goals: LDL < 70mg , HDL > 40 mg.             Education:Diabetes - Individual verbal and written instruction to review signs/symptoms of diabetes, desired ranges of glucose level fasting, after meals and with exercise. Acknowledge that pre and post exercise glucose checks will be done for 3 sessions at entry of program.   Core Components/Risk Factors/Patient Goals Review:   Goals and Risk Factor Review     Row Name 10/24/23 0925             Core Components/Risk Factors/Patient Goals Review   Personal Goals Review Weight Management/Obesity;Hypertension       Review Mark Mack has a blood pressure machine at home and does not check it. Informed him to check it at least once a day to make sure it is correlating with his rehab readings. He states he will start doing so.       Expected  Outcomes Short: start checking blood pressures at home. Long: maintain blood pressure readings independently.                Core Components/Risk Factors/Patient Goals at Discharge (Final Review):   Goals and Risk Factor Review - 10/24/23 0925       Core Components/Risk Factors/Patient Goals Review   Personal Goals Review Weight Management/Obesity;Hypertension    Review Mark Mack has  a blood pressure machine at home and does not check it. Informed him to check it at least once a day to make sure it is correlating with his rehab readings. He states he will start doing so.    Expected Outcomes Short: start checking blood pressures at home. Long: maintain blood pressure readings independently.             ITP Comments:  ITP Comments     Row Name 10/01/23 1342 10/02/23 1614 10/08/23 0931 10/15/23 1122 11/12/23 1440   ITP Comments Virtual Visit completed. Patient informed on EP and RD appointment and 6 Minute walk test. Patient also informed of patient health questionnaires on My Chart. Patient Verbalizes understanding. Visit diagnosis can be found in [facility] 08/31/2023. Completed and gym orientation. Initial ITP created and sent for review to Dr. Firman Hughes, Medical Director. First full day of exercise!  Patient was oriented to gym and equipment including functions, settings, policies, and procedures.  Patient's individual exercise prescription and treatment plan were reviewed.  All starting workloads were established based on the results of the 6 minute walk test done at initial orientation visit.  The plan for exercise progression was also introduced and progression will be customized based on patient's performance and goals. 30 Day review completed. Medical Director ITP review done, changes made as directed, and signed approval by Medical Director.    new to program 30 Day review completed. Medical Director ITP review done, changes made as directed, and signed approval by Medical Director.     new to program            Comments: 30 day review

## 2023-11-12 NOTE — Progress Notes (Signed)
 Daily Session Note  Patient Details  Name: Mark Mack MRN: [id number] Date of Birth: [birth year] Referring Provider:   Flowsheet Row Cardiac Rehab from 10/02/2023 in [facility] Cardiac and Pulmonary Rehab  Referring Provider Burney Carter, MD       Encounter Date: 11/12/2023  Check In:  Session Check In - 11/12/23 1009       Check-In   Supervising physician immediately available to respond to emergencies See telemetry face sheet for immediately available ER MD    Location [facility]-Cardiac & Pulmonary Rehab    Staff Present Maud Sorenson, RN, BSN, CCRP;Meredith Manson Seitz RN,BSN;Joseph Lacinda Pica RCP,RRT,BSRT;Jason Martina Sledge RDN,LDN    Virtual Visit No    Medication changes reported     No    Fall or balance concerns reported    No    Warm-up and Cool-down Performed on first and last piece of equipment    Resistance Training Performed Yes    VAD Patient? No    PAD/SET Patient? No      Pain Assessment   Currently in Pain? No/denies                Social History   Tobacco Use  Smoking Status Former   Current packs/day: 0.00   Types: Cigarettes   Quit date: 03/23/2018   Years since quitting: 5.6  Smokeless Tobacco Never    Goals Met:  Independence with exercise equipment Exercise tolerated well No report of concerns or symptoms today  Goals Unmet:  Not Applicable  Comments: Pt able to follow exercise prescription today without complaint.  Will continue to monitor for progression.    Dr. Firman Hughes is Medical Director for [facility] Cardiac Rehabilitation.  Dr. Fuad Aleskerov is Medical Director for [facility] Pulmonary Rehabilitation.

## 2023-11-14 ENCOUNTER — Encounter: Payer: Medicare HMO | Admitting: *Deleted

## 2023-11-14 DIAGNOSIS — I213 ST elevation (STEMI) myocardial infarction of unspecified site: Secondary | ICD-10-CM

## 2023-11-14 DIAGNOSIS — I252 Old myocardial infarction: Secondary | ICD-10-CM | POA: Diagnosis not present

## 2023-11-14 NOTE — Progress Notes (Signed)
Daily Session Note  Patient Details  Name: Mark Mack MRN: [id number] Date of Birth: 05/09/1956 Referring Provider:   Flowsheet Row Cardiac Rehab from 10/02/2023 in [facility] Cardiac and Pulmonary Rehab  Referring Provider Dorothyann Peng, MD       Encounter Date: 11/14/2023  Check In:  Session Check In - 11/14/23 0939       Check-In   Supervising physician immediately available to respond to emergencies See telemetry face sheet for immediately available ER MD    Location [facility]-Cardiac & Pulmonary Rehab    Staff Present Bess Kinds RN,BSN;Joseph [facility] [place], [place], Exercise Physiologist    Virtual Visit No    Medication changes reported     No    Fall or balance concerns reported    No    Warm-up and Cool-down Performed on first and last piece of equipment    Resistance Training Performed Yes    VAD Patient? No    PAD/SET Patient? No      Pain Assessment   Currently in Pain? No/denies                Social History   Tobacco Use  Smoking Status Former   Current packs/day: 0.00   Types: Cigarettes   Quit date: 03/23/2018   Years since quitting: 5.6  Smokeless Tobacco Never    Goals Met:  Independence with exercise equipment Exercise tolerated well No report of concerns or symptoms today Strength training completed today  Goals Unmet:  Not Applicable  Comments: Pt able to follow exercise prescription today without complaint.  Will continue to monitor for progression.    Dr. Bethann Punches is Medical Director for [facility] Cardiac Rehabilitation.  Dr. Vida Rigger is Medical Director for [facility] Pulmonary Rehabilitation.

## 2023-11-17 ENCOUNTER — Encounter: Payer: Medicare HMO | Admitting: *Deleted

## 2023-11-17 DIAGNOSIS — I252 Old myocardial infarction: Secondary | ICD-10-CM | POA: Diagnosis not present

## 2023-11-17 DIAGNOSIS — I213 ST elevation (STEMI) myocardial infarction of unspecified site: Secondary | ICD-10-CM

## 2023-11-17 NOTE — Progress Notes (Signed)
Daily Session Note  Patient Details  Name: Mark Mack MRN: [id number] Date of Birth: [birth year] Referring Provider:   Flowsheet Row Cardiac Rehab from 10/02/2023 in [facility] Cardiac and Pulmonary Rehab  Referring Provider Dorothyann Peng, MD       Encounter Date: 11/17/2023  Check In:  Session Check In - 11/17/23 0926       Check-In   Supervising physician immediately available to respond to emergencies See telemetry face sheet for immediately available ER MD    Location [facility]-Cardiac & Pulmonary Rehab    Staff Present Maxon Conetta BS, Exercise Physiologist;Kelly Cloretta Ned, ACSM CEP, Exercise Physiologist;Margaret Best, MS, Exercise Physiologist;Arlette Schaad Jewel Baize RN,BSN    Virtual Visit No    Medication changes reported     No    Fall or balance concerns reported    No    Warm-up and Cool-down Performed on first and last piece of equipment    Resistance Training Performed Yes    VAD Patient? No    PAD/SET Patient? No      Pain Assessment   Currently in Pain? No/denies                Social History   Tobacco Use  Smoking Status Former   Current packs/day: 0.00   Types: Cigarettes   Quit date: 03/23/2018   Years since quitting: 5.6  Smokeless Tobacco Never    Goals Met:  Independence with exercise equipment Exercise tolerated well No report of concerns or symptoms today Strength training completed today  Goals Unmet:  Not Applicable  Comments: Pt able to follow exercise prescription today without complaint.  Will continue to monitor for progression.    Dr. Bethann Punches is Medical Director for [facility] Cardiac Rehabilitation.  Dr. Vida Rigger is Medical Director for [facility] Pulmonary Rehabilitation.

## 2023-11-19 ENCOUNTER — Encounter: Payer: Medicare HMO | Admitting: *Deleted

## 2023-11-19 DIAGNOSIS — I252 Old myocardial infarction: Secondary | ICD-10-CM | POA: Diagnosis not present

## 2023-11-19 DIAGNOSIS — I213 ST elevation (STEMI) myocardial infarction of unspecified site: Secondary | ICD-10-CM

## 2023-11-19 NOTE — Progress Notes (Signed)
Daily Session Note  Patient Details  Name: Mark Mack MRN: [id number] Date of Birth: 04/29/1956 Referring Provider:   Flowsheet Row Cardiac Rehab from 10/02/2023 in [facility] Cardiac and Pulmonary Rehab  Referring Provider Dorothyann Peng, MD       Encounter Date: 11/19/2023  Check In:  Session Check In - 11/19/23 1000       Check-In   Supervising physician immediately available to respond to emergencies See telemetry face sheet for immediately available ER MD    Location [facility]-Cardiac & Pulmonary Rehab    Staff Present Maxon Conetta BS, Exercise Physiologist;Faris Coolman, RN, BSN, CCRP;Noah Tickle, BS, Exercise Physiologist    Virtual Visit No    Medication changes reported     No    Fall or balance concerns reported    No    Warm-up and Cool-down Performed on first and last piece of equipment    Resistance Training Performed Yes    VAD Patient? No    PAD/SET Patient? No      Pain Assessment   Currently in Pain? No/denies                Social History   Tobacco Use  Smoking Status Former   Current packs/day: 0.00   Types: Cigarettes   Quit date: 03/23/2018   Years since quitting: 5.6  Smokeless Tobacco Never    Goals Met:  Independence with exercise equipment Exercise tolerated well No report of concerns or symptoms today  Goals Unmet:  Not Applicable  Comments: Pt able to follow exercise prescription today without complaint.  Will continue to monitor for progression.    Dr. Bethann Punches is Medical Director for [facility] Cardiac Rehabilitation.  Dr. Vida Rigger is Medical Director for [facility] Pulmonary Rehabilitation.

## 2023-11-21 ENCOUNTER — Encounter: Payer: Medicare HMO | Admitting: *Deleted

## 2023-11-21 DIAGNOSIS — I252 Old myocardial infarction: Secondary | ICD-10-CM | POA: Diagnosis not present

## 2023-11-21 DIAGNOSIS — I213 ST elevation (STEMI) myocardial infarction of unspecified site: Secondary | ICD-10-CM

## 2023-11-21 NOTE — Progress Notes (Signed)
Daily Session Note  Patient Details  Name: Mark Mack MRN: [id number] Date of Birth: Nov 17, 1955 Referring Provider:   Flowsheet Row Cardiac Rehab from 10/02/2023 in [facility] Cardiac and Pulmonary Rehab  Referring Provider Dorothyann Peng, MD       Encounter Date: 11/21/2023  Check In:  Session Check In - 11/21/23 0935       Check-In   Supervising physician immediately available to respond to emergencies See telemetry face sheet for immediately available ER MD    Location [facility]-Cardiac & Pulmonary Rehab    Staff Present Cora Collum, RN, BSN, CCRP;Joseph Hood RCP,RRT,BSRT;Noah Tickle, [place], Exercise Physiologist    Virtual Visit No    Medication changes reported     No    Fall or balance concerns reported    No    Warm-up and Cool-down Performed on first and last piece of equipment    Resistance Training Performed Yes    VAD Patient? No    PAD/SET Patient? No      Pain Assessment   Currently in Pain? No/denies                Social History   Tobacco Use  Smoking Status Former   Current packs/day: 0.00   Types: Cigarettes   Quit date: 03/23/2018   Years since quitting: 5.6  Smokeless Tobacco Never    Goals Met:  Independence with exercise equipment Exercise tolerated well No report of concerns or symptoms today  Goals Unmet:  Not Applicable  Comments: Pt able to follow exercise prescription today without complaint.  Will continue to monitor for progression.    Dr. Bethann Punches is Medical Director for [facility] Cardiac Rehabilitation.  Dr. Vida Rigger is Medical Director for [facility] Pulmonary Rehabilitation.

## 2023-11-24 ENCOUNTER — Encounter: Payer: Medicare HMO | Admitting: *Deleted

## 2023-11-24 DIAGNOSIS — I252 Old myocardial infarction: Secondary | ICD-10-CM | POA: Diagnosis not present

## 2023-11-24 DIAGNOSIS — I213 ST elevation (STEMI) myocardial infarction of unspecified site: Secondary | ICD-10-CM

## 2023-11-24 NOTE — Progress Notes (Signed)
Daily Session Note  Patient Details  Name: Mark Mack MRN: [id number] Date of Birth: 1955/11/22 Referring Provider:   Flowsheet Row Cardiac Rehab from 10/02/2023 in [facility] Cardiac and Pulmonary Rehab  Referring Provider Dorothyann Peng, MD       Encounter Date: 11/24/2023  Check In:  Session Check In - 11/24/23 0949       Check-In   Supervising physician immediately available to respond to emergencies See telemetry face sheet for immediately available ER MD    Location [facility]-Cardiac & Pulmonary Rehab    Staff Present Rory Percy, MS, Exercise Physiologist;Lakeeta Dobosz, RN, BSN, CCRP;Kelly Hayes BS, ACSM CEP, Exercise Physiologist;Maxon Conetta BS, Exercise Physiologist    Virtual Visit No    Medication changes reported     No    Fall or balance concerns reported    No    Warm-up and Cool-down Performed on first and last piece of equipment    Resistance Training Performed Yes    VAD Patient? No    PAD/SET Patient? No      Pain Assessment   Currently in Pain? No/denies                Social History   Tobacco Use  Smoking Status Former   Current packs/day: 0.00   Types: Cigarettes   Quit date: 03/23/2018   Years since quitting: 5.6  Smokeless Tobacco Never    Goals Met:  Independence with exercise equipment Exercise tolerated well No report of concerns or symptoms today  Goals Unmet:  Not Applicable  Comments: Pt able to follow exercise prescription today without complaint.  Will continue to monitor for progression.    Dr. Bethann Punches is Medical Director for [facility] Cardiac Rehabilitation.  Dr. Vida Rigger is Medical Director for [facility] Pulmonary Rehabilitation.

## 2023-11-26 DIAGNOSIS — I213 ST elevation (STEMI) myocardial infarction of unspecified site: Secondary | ICD-10-CM

## 2023-11-26 DIAGNOSIS — I252 Old myocardial infarction: Secondary | ICD-10-CM | POA: Diagnosis not present

## 2023-11-26 NOTE — Progress Notes (Signed)
Daily Session Note  Patient Details  Name: Mark Mack MRN: [id number] Date of Birth: 1956/01/17 Referring Provider:   Flowsheet Row Cardiac Rehab from 10/02/2023 in [facility] Cardiac and Pulmonary Rehab  Referring Provider Dorothyann Peng, MD       Encounter Date: 11/26/2023  Check In:  Session Check In - 11/26/23 4098       Check-In   Supervising physician immediately available to respond to emergencies See telemetry face sheet for immediately available ER MD    Location [facility]-Cardiac & Pulmonary Rehab    Staff Present Kelton Pillar RN,BSN,MPA;Margaret Best, MS, Exercise Physiologist;Maxon Conetta BS, Exercise Physiologist;Joseph Reino Kent RCP,RRT,BSRT    Virtual Visit No    Medication changes reported     No    Fall or balance concerns reported    No    Tobacco Cessation No Change    Warm-up and Cool-down Performed on first and last piece of equipment    Resistance Training Performed Yes    VAD Patient? No    PAD/SET Patient? No      Pain Assessment   Currently in Pain? No/denies                Social History   Tobacco Use  Smoking Status Former   Current packs/day: 0.00   Types: Cigarettes   Quit date: 03/23/2018   Years since quitting: 5.6  Smokeless Tobacco Never    Goals Met:  Independence with exercise equipment Exercise tolerated well No report of concerns or symptoms today Strength training completed today  Goals Unmet:  Not Applicable  Comments: Pt able to follow exercise prescription today without complaint.  Will continue to monitor for progression.    Dr. Bethann Punches is Medical Director for [facility] Cardiac Rehabilitation.  Dr. Vida Rigger is Medical Director for [facility] Pulmonary Rehabilitation.

## 2023-11-28 ENCOUNTER — Encounter: Payer: Medicare HMO | Admitting: *Deleted

## 2023-11-28 DIAGNOSIS — I252 Old myocardial infarction: Secondary | ICD-10-CM | POA: Diagnosis not present

## 2023-11-28 DIAGNOSIS — I213 ST elevation (STEMI) myocardial infarction of unspecified site: Secondary | ICD-10-CM

## 2023-11-28 NOTE — Progress Notes (Signed)
Daily Session Note  Patient Details  Name: Mark Mack MRN: [id number] Date of Birth: 12-Jan-1956 Referring Provider:   Flowsheet Row Cardiac Rehab from 10/02/2023 in [facility] Cardiac and Pulmonary Rehab  Referring Provider Dorothyann Peng, MD       Encounter Date: 11/28/2023  Check In:  Session Check In - 11/28/23 7829       Check-In   Supervising physician immediately available to respond to emergencies See telemetry face sheet for immediately available ER MD    Location [facility]-Cardiac & Pulmonary Rehab    Staff Present Cora Collum, RN, BSN, CCRP;Noah Tickle, BS, Exercise Physiologist;Joseph Hood RCP,RRT,BSRT    Virtual Visit No    Medication changes reported     No    Fall or balance concerns reported    No    Warm-up and Cool-down Performed on first and last piece of equipment    Resistance Training Performed Yes    VAD Patient? No    PAD/SET Patient? No      Pain Assessment   Currently in Pain? No/denies                Social History   Tobacco Use  Smoking Status Former   Current packs/day: 0.00   Types: Cigarettes   Quit date: 03/23/2018   Years since quitting: 5.6  Smokeless Tobacco Never    Goals Met:  Independence with exercise equipment Exercise tolerated well No report of concerns or symptoms today  Goals Unmet:  Not Applicable  Comments: Pt able to follow exercise prescription today without complaint.  Will continue to monitor for progression.    Dr. Bethann Punches is Medical Director for [facility] Cardiac Rehabilitation.  Dr. Vida Rigger is Medical Director for [facility] Pulmonary Rehabilitation.

## 2023-12-01 ENCOUNTER — Encounter: Payer: Medicare HMO | Attending: Internal Medicine

## 2023-12-01 DIAGNOSIS — I252 Old myocardial infarction: Secondary | ICD-10-CM | POA: Insufficient documentation

## 2023-12-01 DIAGNOSIS — Z48812 Encounter for surgical aftercare following surgery on the circulatory system: Secondary | ICD-10-CM | POA: Insufficient documentation

## 2023-12-01 DIAGNOSIS — I213 ST elevation (STEMI) myocardial infarction of unspecified site: Secondary | ICD-10-CM

## 2023-12-01 NOTE — Progress Notes (Signed)
Daily Session Note  Patient Details  Name: Jaquon Gingerich MRN: [id number] Date of Birth: 1956/04/16 Referring Provider:   Flowsheet Row Cardiac Rehab from 10/02/2023 in [facility] Cardiac and Pulmonary Rehab  Referring Provider Dorothyann Peng, MD       Encounter Date: 12/01/2023  Check In:  Session Check In - 12/01/23 6433       Check-In   Supervising physician immediately available to respond to emergencies See telemetry face sheet for immediately available ER MD    Location [facility]-Cardiac & Pulmonary Rehab    Staff Present Kelton Pillar RN,BSN,MPA;Maxon Conetta BS, Exercise Physiologist;Allysa Governale Cloretta Ned, ACSM CEP, Exercise Physiologist;Margaret Best, MS, Exercise Physiologist    Virtual Visit No    Medication changes reported     No    Fall or balance concerns reported    No    Tobacco Cessation No Change    Warm-up and Cool-down Performed on first and last piece of equipment    Resistance Training Performed Yes    VAD Patient? No    PAD/SET Patient? No      Pain Assessment   Currently in Pain? No/denies                Social History   Tobacco Use  Smoking Status Former   Current packs/day: 0.00   Types: Cigarettes   Quit date: 03/23/2018   Years since quitting: 5.6  Smokeless Tobacco Never    Goals Met:  Independence with exercise equipment Exercise tolerated well No report of concerns or symptoms today Strength training completed today  Goals Unmet:  Not Applicable  Comments: Pt able to follow exercise prescription today without complaint.  Will continue to monitor for progression.    Dr. Bethann Punches is Medical Director for [facility] Cardiac Rehabilitation.  Dr. Vida Rigger is Medical Director for [facility] Pulmonary Rehabilitation.

## 2023-12-03 ENCOUNTER — Encounter: Payer: Medicare HMO | Admitting: *Deleted

## 2023-12-03 DIAGNOSIS — Z48812 Encounter for surgical aftercare following surgery on the circulatory system: Secondary | ICD-10-CM | POA: Diagnosis not present

## 2023-12-03 DIAGNOSIS — I213 ST elevation (STEMI) myocardial infarction of unspecified site: Secondary | ICD-10-CM

## 2023-12-03 NOTE — Progress Notes (Signed)
 Daily Session Note  Patient Details  Name: Mark Mack MRN: [id number] Date of Birth: [birth year] Referring Provider:   Flowsheet Row Cardiac Rehab from 10/02/2023 in [facility] Cardiac and Pulmonary Rehab  Referring Provider Florencio Kava, MD       Encounter Date: 12/03/2023  Check In:  Session Check In - 12/03/23 0955       Check-In   Supervising physician immediately available to respond to emergencies See telemetry face sheet for immediately available ER MD    Location [facility]-Cardiac & Pulmonary Rehab    Staff Present Rollene Paterson, MS, Exercise Physiologist;Maxon Burnell HECKLE, Exercise Physiologist;Joseph Hood RCP,RRT,BSRT;Meredith Craven RN,BSN;Karlo Goeden, RN, BSN, CCRP    Virtual Visit No    Medication changes reported     No    Fall or balance concerns reported    No    Warm-up and Cool-down Performed on first and last piece of equipment    Resistance Training Performed Yes    VAD Patient? No    PAD/SET Patient? No      Pain Assessment   Currently in Pain? No/denies                Social History   Tobacco Use  Smoking Status Former   Current packs/day: 0.00   Types: Cigarettes   Quit date: 03/23/2018   Years since quitting: 5.7  Smokeless Tobacco Never    Goals Met:  Independence with exercise equipment Exercise tolerated well No report of concerns or symptoms today  Goals Unmet:  Not Applicable  Comments: Pt able to follow exercise prescription today without complaint.  Will continue to monitor for progression.    Dr. Oneil Pinal is Medical Director for [facility] Cardiac Rehabilitation.  Dr. Fuad Aleskerov is Medical Director for [facility] Pulmonary Rehabilitation.

## 2023-12-04 ENCOUNTER — Encounter: Payer: Medicare HMO | Admitting: *Deleted

## 2023-12-04 DIAGNOSIS — Z48812 Encounter for surgical aftercare following surgery on the circulatory system: Secondary | ICD-10-CM | POA: Diagnosis not present

## 2023-12-04 DIAGNOSIS — I213 ST elevation (STEMI) myocardial infarction of unspecified site: Secondary | ICD-10-CM

## 2023-12-04 NOTE — Progress Notes (Signed)
 Daily Session Note  Patient Details  Name: Mark Mack MRN: [id number] Date of Birth: 1956-03-11 Referring Provider:   Flowsheet Row Cardiac Rehab from 10/02/2023 in [facility] Cardiac and Pulmonary Rehab  Referring Provider Florencio Kava, MD       Encounter Date: 12/04/2023  Check In:  Session Check In - 12/04/23 0947       Check-In   Supervising physician immediately available to respond to emergencies See telemetry face sheet for immediately available ER MD    Location [facility]-Cardiac & Pulmonary Rehab    Staff Present Othel Durand, RN, BSN, CCRP;Maxon Conetta BS, Exercise Physiologist;Joseph Hood RCP,RRT,BSRT;Noah Tickle, [place], Exercise Physiologist    Virtual Visit No    Medication changes reported     No    Fall or balance concerns reported    No    Warm-up and Cool-down Performed on first and last piece of equipment    Resistance Training Performed Yes    VAD Patient? No    PAD/SET Patient? No      Pain Assessment   Currently in Pain? No/denies                Social History   Tobacco Use  Smoking Status Former   Current packs/day: 0.00   Types: Cigarettes   Quit date: 03/23/2018   Years since quitting: 5.7  Smokeless Tobacco Never    Goals Met:  Independence with exercise equipment Exercise tolerated well No report of concerns or symptoms today  Goals Unmet:  Not Applicable  Comments: Pt able to follow exercise prescription today without complaint.  Will continue to monitor for progression.    Dr. Oneil Pinal is Medical Director for [facility] Cardiac Rehabilitation.  Dr. Fuad Aleskerov is Medical Director for [facility] Pulmonary Rehabilitation.

## 2023-12-05 ENCOUNTER — Encounter: Payer: Medicare HMO | Admitting: *Deleted

## 2023-12-05 DIAGNOSIS — I213 ST elevation (STEMI) myocardial infarction of unspecified site: Secondary | ICD-10-CM

## 2023-12-05 DIAGNOSIS — Z48812 Encounter for surgical aftercare following surgery on the circulatory system: Secondary | ICD-10-CM | POA: Diagnosis not present

## 2023-12-05 NOTE — Progress Notes (Signed)
 Daily Session Note  Patient Details  Name: Mark Mack MRN: [id number] Date of Birth: 19-Aug-1956 Referring Provider:   Flowsheet Row Cardiac Rehab from 10/02/2023 in [facility] Cardiac and Pulmonary Rehab  Referring Provider Florencio Kava, MD       Encounter Date: 12/05/2023  Check In:  Session Check In - 12/05/23 0942       Check-In   Supervising physician immediately available to respond to emergencies See telemetry face sheet for immediately available ER MD    Location [facility]-Cardiac & Pulmonary Rehab    Staff Present Othel Durand, RN, BSN, CCRP;Joseph Hood RCP,RRT,BSRT;Noah Tickle, [place], Exercise Physiologist    Virtual Visit No    Medication changes reported     No    Fall or balance concerns reported    No    Warm-up and Cool-down Performed on first and last piece of equipment    Resistance Training Performed Yes    VAD Patient? No    PAD/SET Patient? No      Pain Assessment   Currently in Pain? No/denies                Social History   Tobacco Use  Smoking Status Former   Current packs/day: 0.00   Types: Cigarettes   Quit date: 03/23/2018   Years since quitting: 5.7  Smokeless Tobacco Never    Goals Met:  Independence with exercise equipment Exercise tolerated well No report of concerns or symptoms today  Goals Unmet:  Not Applicable  Comments: Pt able to follow exercise prescription today without complaint.  Will continue to monitor for progression.    Dr. Oneil Pinal is Medical Director for [facility] Cardiac Rehabilitation.  Dr. Fuad Aleskerov is Medical Director for [facility] Pulmonary Rehabilitation.

## 2023-12-08 ENCOUNTER — Encounter: Payer: Medicare HMO | Admitting: *Deleted

## 2023-12-08 DIAGNOSIS — Z48812 Encounter for surgical aftercare following surgery on the circulatory system: Secondary | ICD-10-CM | POA: Diagnosis not present

## 2023-12-08 DIAGNOSIS — I213 ST elevation (STEMI) myocardial infarction of unspecified site: Secondary | ICD-10-CM

## 2023-12-08 NOTE — Progress Notes (Signed)
 Daily Session Note  Patient Details  Name: Mark Mack MRN: [id number] Date of Birth: 10-27-1956 Referring Provider:   Flowsheet Row Cardiac Rehab from 10/02/2023 in [facility] Cardiac and Pulmonary Rehab  Referring Provider Burney Carter, MD       Encounter Date: 12/08/2023  Check In:  Session Check In - 12/08/23 0945       Check-In   Supervising physician immediately available to respond to emergencies See telemetry face sheet for immediately available ER MD    Location [facility]-Cardiac & Pulmonary Rehab    Staff Present Maud Sorenson, RN, BSN, CCRP;Kelly Hayes BS, ACSM CEP, Exercise Physiologist;Maxon Conetta BS, Exercise Physiologist;Margaret Best, MS, Exercise Physiologist    Virtual Visit No    Medication changes reported     No    Fall or balance concerns reported    No    Warm-up and Cool-down Performed on first and last piece of equipment    Resistance Training Performed Yes    VAD Patient? No    PAD/SET Patient? No      Pain Assessment   Currently in Pain? No/denies                Social History   Tobacco Use  Smoking Status Former   Current packs/day: 0.00   Types: Cigarettes   Quit date: 03/23/2018   Years since quitting: 5.7  Smokeless Tobacco Never    Goals Met:  Independence with exercise equipment Exercise tolerated well No report of concerns or symptoms today  Goals Unmet:  Not Applicable  Comments: Pt able to follow exercise prescription today without complaint.  Will continue to monitor for progression.    Dr. Firman Hughes is Medical Director for [facility] Cardiac Rehabilitation.  Dr. Fuad Aleskerov is Medical Director for [facility] Pulmonary Rehabilitation.

## 2023-12-10 ENCOUNTER — Encounter: Payer: Self-pay | Admitting: *Deleted

## 2023-12-10 ENCOUNTER — Encounter: Payer: Medicare HMO | Admitting: *Deleted

## 2023-12-10 DIAGNOSIS — Z48812 Encounter for surgical aftercare following surgery on the circulatory system: Secondary | ICD-10-CM | POA: Diagnosis not present

## 2023-12-10 DIAGNOSIS — I213 ST elevation (STEMI) myocardial infarction of unspecified site: Secondary | ICD-10-CM

## 2023-12-10 NOTE — Progress Notes (Signed)
Daily Session Note  Patient Details  Name: Mark Mack MRN: [id number] Date of Birth: 08-13-1956 Referring Provider:   Flowsheet Row Cardiac Rehab from 10/02/2023 in [facility] Cardiac and Pulmonary Rehab  Referring Provider Dorothyann Peng, MD       Encounter Date: 12/10/2023  Check In:  Session Check In - 12/10/23 1438       Check-In   Supervising physician immediately available to respond to emergencies See telemetry face sheet for immediately available ER MD    Location [facility]-Cardiac & Pulmonary Rehab    Staff Present Cora Collum, RN, BSN, CCRP;Meredith Jewel Baize RN,BSN;Joseph Hood RCP,RRT,BSRT;Maxon [place] BS, Exercise Physiologist;Margaret Best, MS, Exercise Physiologist    Virtual Visit No    Medication changes reported     No    Fall or balance concerns reported    No    Warm-up and Cool-down Performed on first and last piece of equipment    Resistance Training Performed Yes    VAD Patient? No    PAD/SET Patient? No      Pain Assessment   Currently in Pain? No/denies                Social History   Tobacco Use  Smoking Status Former   Current packs/day: 0.00   Types: Cigarettes   Quit date: 03/23/2018   Years since quitting: 5.7  Smokeless Tobacco Never    Goals Met:  Independence with exercise equipment Exercise tolerated well No report of concerns or symptoms today  Goals Unmet:  Not Applicable  Comments: Pt able to follow exercise prescription today without complaint.  Will continue to monitor for progression.    Dr. Bethann Punches is Medical Director for [facility] Cardiac Rehabilitation.  Dr. Vida Rigger is Medical Director for [facility] Pulmonary Rehabilitation.

## 2023-12-10 NOTE — Progress Notes (Signed)
Cardiac Individual Treatment Plan  Patient Details  Name: Samar Dass MRN: [id number] Date of Birth: 1956/10/16 Referring Provider:   Flowsheet Row Cardiac Rehab from 10/02/2023 in [facility] Cardiac and Pulmonary Rehab  Referring Provider Dorothyann Peng, MD       Initial Encounter Date:  Flowsheet Row Cardiac Rehab from 10/02/2023 in [facility] Cardiac and Pulmonary Rehab  Date 10/02/23       Visit Diagnosis: ST elevation myocardial infarction (STEMI), unspecified artery (HCC)  Patient's Home Medications on Admission:  Current Outpatient Medications:    acetaminophen (TYLENOL) 325 MG tablet, Take 1-2 tablets (325-650 mg total) by mouth every 4 (four) hours as needed for mild pain (pain score 1-3). (Patient not taking: Reported on 10/01/2023), Disp: , Rfl:    aspirin EC 81 MG tablet, Take 1 tablet (81 mg total) by mouth daily. Swallow whole., Disp: 100 tablet, Rfl: 0   atorvastatin (LIPITOR) 40 MG tablet, Take 1 tablet (40 mg total) by mouth daily., Disp: 30 tablet, Rfl: 0   atorvastatin (LIPITOR) 40 MG tablet, Take 1 tablet by mouth daily. (Patient not taking: Reported on 10/01/2023), Disp: , Rfl:    clopidogrel (PLAVIX) 75 MG tablet, Take 1 tablet (75 mg total) by mouth daily. (Patient not taking: Reported on 10/01/2023), Disp: 30 tablet, Rfl: 0   clopidogrel (PLAVIX) 75 MG tablet, Take 1 tablet by mouth daily. (Patient not taking: Reported on 10/01/2023), Disp: , Rfl:    L-METHYLFOLATE CALCIUM PO, Take 1 tablet by mouth daily. (Patient not taking: Reported on 10/01/2023), Disp: , Rfl:    l-methylfolate-B6-B12 (METANX) 3-35-2 MG TABS tablet, Take 1 tablet by mouth daily., Disp: 60 tablet, Rfl: 0   MAGnesium-Oxide 400 (240 Mg) MG tablet, Take 0.5 tablets (200 mg total) by mouth at bedtime., Disp: 30 tablet, Rfl: 0   metoprolol succinate (TOPROL-XL) 25 MG 24 hr tablet, Take 0.5 tablets (12.5 mg total) by mouth daily., Disp: 30 tablet, Rfl: 0   Multiple Vitamin (MULTIVITAMIN WITH MINERALS) TABS tablet,  Take 1 tablet by mouth daily. (Patient not taking: Reported on 10/01/2023), Disp: , Rfl:    naltrexone (DEPADE) 50 MG tablet, Take by mouth. (Patient not taking: Reported on 10/01/2023), Disp: , Rfl:    nitroGLYCERIN (NITROSTAT) 0.4 MG SL tablet, Place 1 tablet (0.4 mg total) under the tongue every 5 (five) minutes as needed for chest pain. Can use up to three times as needed. You need to call MD if you need to use two or more pills., Disp: 30 tablet, Rfl: 0   pantoprazole (PROTONIX) 40 MG tablet, Take 1 tablet (40 mg total) by mouth at bedtime., Disp: 30 tablet, Rfl: 0   pantoprazole (PROTONIX) 40 MG tablet, Take by mouth. (Patient not taking: Reported on 10/01/2023), Disp: , Rfl:    silver sulfADIAZINE (SILVADENE) 1 % cream, Apply topically 2 (two) times daily. (Patient not taking: Reported on 10/01/2023), Disp: 50 g, Rfl: 0   Telmisartan-amLODIPine 80-10 MG TABS, SMARTSIG:1.0 Tablet(s) By Mouth Daily (Patient not taking: Reported on 10/01/2023), Disp: , Rfl:    thiamine (VITAMIN B1) 100 MG tablet, Take 1 tablet (100 mg total) by mouth daily. (Patient not taking: Reported on 10/01/2023), Disp: 30 tablet, Rfl: 0   traZODone (DESYREL) 50 MG tablet, Take 1 tablet (50 mg total) by mouth at bedtime. (Patient not taking: Reported on 10/01/2023), Disp: 30 tablet, Rfl: 0   traZODone (DESYREL) 50 MG tablet, Take by mouth. (Patient not taking: Reported on 10/01/2023), Disp: , Rfl:    vitamin D3 (CHOLECALCIFEROL)  25 MCG tablet, Take 3 tablets (3,000 Units total) by mouth daily., Disp: 90 tablet, Rfl: 0  Past Medical History: Past Medical History:  Diagnosis Date   CAD (coronary artery disease)    Hypertension    OA (osteoarthritis) of hip    bilateral hips   Tobacco use     Tobacco Use: Social History   Tobacco Use  Smoking Status Former   Current packs/day: 0.00   Types: Cigarettes   Quit date: 03/23/2018   Years since quitting: 5.7  Smokeless Tobacco Never    Labs: Review Flowsheet       Latest  Ref Rng & Units 08/31/2023 09/01/2023  Labs for ITP Cardiac and Pulmonary Rehab  Cholestrol 0 - 200 mg/dL 161  -  LDL (calc) 0 - 99 mg/dL 57  -  HDL-C >09 mg/dL 62  -  Trlycerides <604 mg/dL 540  72   Hemoglobin J8J 4.8 - 5.6 % 5.8  -  PH, Arterial 7.35 - 7.45 7.08  7.12  7.06  7.08  6.867  7.46  7.39  7.27  7.2   PCO2 arterial 32 - 48 mmHg 51  48  56  56  75.8  40  48  52  54   Bicarbonate 20.0 - 28.0 mmol/L 15.1  15.6  15.9  16.9  16.6  15.0  13.8  28.4  29.1  23.9  21.1   TCO2 22 - 32 mmol/L 22 - 32 mmol/L 18  16  -  Acid-base deficit 0.0 - 2.0 mmol/L 14.9  13.6  14.6  15.0  13.8  20.0  20.0  3.6  7.4   O2 Saturation % 88.9  96.6  71.4  97.1  74.4  78.4  33  69  93.5  91.3  91.8  98     Details       Multiple values from one day are sorted in reverse-chronological order          Exercise Target Goals: Exercise Program Goal: Individual exercise prescription set using results from initial 6 min walk test and THRR while considering  patient's activity barriers and safety.   Exercise Prescription Goal: Initial exercise prescription builds to 30-45 minutes a day of aerobic activity, 2-3 days per week.  Home exercise guidelines will be given to patient during program as part of exercise prescription that the participant will acknowledge.   Education: Aerobic Exercise: - Group verbal and visual presentation on the components of exercise prescription. Introduces F.I.T.T principle from ACSM for exercise prescriptions.  Reviews F.I.T.T. principles of aerobic exercise including progression. Written material given at graduation. Flowsheet Row Cardiac Rehab from 08/05/2018 in [facility] Cardiac and Pulmonary Rehab  Date 06/01/18 (P)   Educator JH (P)   Instruction Review Code 1- Verbalizes Understanding (P)        Education: Resistance Exercise: - Group verbal and visual presentation on the components of exercise prescription. Introduces F.I.T.T principle from ACSM for exercise prescriptions   Reviews F.I.T.T. principles of resistance exercise including progression. Written material given at graduation. Flowsheet Row Cardiac Rehab from 12/03/2023 in [facility] Cardiac and Pulmonary Rehab  Education need identified 10/08/23        Education: Exercise & Equipment Safety: - Individual verbal instruction and demonstration of equipment use and safety with use of the equipment. Flowsheet Row Cardiac Rehab from 10/08/2023 in [facility] Cardiac and Pulmonary Rehab  Date 10/02/23  Educator MB  Instruction Review Code 1- [employer] Understanding       Education:  Exercise Physiology & General Exercise Guidelines: - Group verbal and written instruction with models to review the exercise physiology of the cardiovascular system and associated critical values. Provides general exercise guidelines with specific guidelines to those with heart or lung disease.  Flowsheet Row Cardiac Rehab from 12/03/2023 in [facility] Cardiac and Pulmonary Rehab  Education need identified 10/08/23       Education: Flexibility, Balance, Mind/Body Relaxation: - Group verbal and visual presentation with interactive activity on the components of exercise prescription. Introduces F.I.T.T principle from ACSM for exercise prescriptions. Reviews F.I.T.T. principles of flexibility and balance exercise training including progression. Also discusses the mind body connection.  Reviews various relaxation techniques to help reduce and manage stress (i.e. Deep breathing, progressive muscle relaxation, and visualization). Balance handout provided to take home. Written material given at graduation. Flowsheet Row Cardiac Rehab from 08/05/2018 in [facility] Cardiac and Pulmonary Rehab  Date 08/03/18 (P)   Educator KH (P)   Instruction Review Code 1- Verbalizes Understanding (P)        Activity Barriers & Risk Stratification:  Activity Barriers & Cardiac Risk Stratification - 10/02/23 1616       Activity Barriers & Cardiac Risk Stratification    Activity Barriers None    Cardiac Risk Stratification High             6 Minute Walk:  6 Minute Walk     Row Name 10/02/23 1614         6 Minute Walk   Phase Initial     Distance 1615 feet     Walk Time 6 minutes     # of Rest Breaks 0     MPH 3.1     METS 4.51     RPE 13     Perceived Dyspnea  0     VO2 Peak 15.77     Symptoms No     Resting HR 67 bpm     Resting BP 166/86     Resting Oxygen Saturation  97 %     Exercise Oxygen Saturation  during 6 min walk 100 %     Max Ex. HR 115 bpm     Max Ex. BP 184/72     2 Minute Post BP 158/84              Oxygen Initial Assessment:   Oxygen Re-Evaluation:   Oxygen Discharge (Final Oxygen Re-Evaluation):   Initial Exercise Prescription:  Initial Exercise Prescription - 10/02/23 1600       Date of Initial Exercise RX and Referring Provider   Date 10/02/23    Referring Provider Dorothyann Peng, MD      Oxygen   Maintain Oxygen Saturation 88% or higher      Treadmill   MPH 2.5    Grade 1    Minutes 15    METs 3.26      Recumbant Bike   Level 4    RPM 50    Watts 52    Minutes 15    METs 4.51      NuStep   Level 4    SPM 80    Minutes 15    METs 4.51      Elliptical   Level 1    Speed 3    Minutes 15    METs 4.51      REL-XR   Level 3    Speed 50    Minutes 15    METs 4.51  Prescription Details   Frequency (times per week) 3    Duration Progress to 30 minutes of continuous aerobic without signs/symptoms of physical distress      Intensity   THRR 40-80% of Max Heartrate 101-136    Ratings of Perceived Exertion 11-13    Perceived Dyspnea 0-4      Progression   Progression Continue to progress workloads to maintain intensity without signs/symptoms of physical distress.      Resistance Training   Training Prescription Yes    Weight 7 lb    Reps 10-15             Perform Capillary Blood Glucose checks as needed.  Exercise Prescription Changes:   Exercise  Prescription Changes     Row Name 10/02/23 1600 10/16/23 1600 10/28/23 1500 11/26/23 1000 11/27/23 1400     Response to Exercise   Blood Pressure (Admit) 166/86 108/62 110/66 -- 128/70   Blood Pressure (Exercise) 184/72 162/70 164/78 -- 162/78   Blood Pressure (Exit) 158/84 112/60 108/60 -- 124/68   Heart Rate (Admit) 67 bpm 72 bpm 85 bpm -- 74 bpm   Heart Rate (Exercise) 115 bpm 130 bpm 139 bpm -- 137 bpm   Heart Rate (Exit) 65 bpm 91 bpm 72 bpm -- 67 bpm   Oxygen Saturation (Admit) 97 % -- -- -- --   Oxygen Saturation (Exercise) 100 % -- -- -- --   Oxygen Saturation (Exit) 97 % -- -- -- --   Rating of Perceived Exertion (Exercise) 13 13 14  -- 15   Perceived Dyspnea (Exercise) 0 0 0 -- --   Symptoms none none none -- none   Comments results -- -- -- --   Duration Progress to 30 minutes of  aerobic without signs/symptoms of physical distress Progress to 30 minutes of  aerobic without signs/symptoms of physical distress Progress to 30 minutes of  aerobic without signs/symptoms of physical distress -- Continue with 30 min of aerobic exercise without signs/symptoms of physical distress.   Intensity THRR New THRR New THRR New -- THRR unchanged     Progression   Progression Continue to progress workloads to maintain intensity without signs/symptoms of physical distress. Continue to progress workloads to maintain intensity without signs/symptoms of physical distress. Continue to progress workloads to maintain intensity without signs/symptoms of physical distress. -- Continue to progress workloads to maintain intensity without signs/symptoms of physical distress.   Average METs 4.51 3.43 3.2 -- 3.77     Resistance Training   Training Prescription -- Yes Yes -- Yes   Weight -- 7 lb 7 lb -- 7 lb   Reps -- 10-15 10-15 -- 10-15     Interval Training   Interval Training -- No No -- No     Treadmill   MPH -- 2.7 2.7 -- 2.6   Grade -- 0 0 -- 0   Minutes -- 15 15 -- 15   METs -- 3.07 3.07  -- 2.99     Recumbant Bike   Level -- -- 5 -- 4   Watts -- -- 37 -- 30   Minutes -- -- 15 -- 15   METs -- -- 3.77 -- 3.4     NuStep   Level -- 4 3 -- 4   Minutes -- 15 15 -- 15   METs -- 4.3 2.2 -- 2.1     Elliptical   Level -- 1 -- -- 2   Speed -- 3.1 -- -- 3.1  Minutes -- 15 -- -- 15     REL-XR   Level -- -- 4 -- 4   Minutes -- -- 15 -- 15   METs -- -- 5 -- 5.5     T5 Nustep   Level -- -- 4 -- --   Minutes -- -- 15 -- --   METs -- -- 2.1 -- --     Home Exercise Plan   Plans to continue exercise at -- -- -- [employer] (comment)  [employer] (classes and independent exercise) [employer] (comment)  [employer] (classes and independent exercise)   Frequency -- -- -- Add 2 additional days to program exercise sessions. Add 2 additional days to program exercise sessions.   Initial Home Exercises Provided -- -- -- 11/26/23 11/26/23     Oxygen   Maintain Oxygen Saturation -- 88% or higher 88% or higher -- 88% or higher    Row Name 12/08/23 1500             Response to Exercise   Blood Pressure (Admit) 128/66       Blood Pressure (Exit) 136/72       Heart Rate (Admit) 74 bpm       Heart Rate (Exercise) 130 bpm       Heart Rate (Exit) 75 bpm       Rating of Perceived Exertion (Exercise) 15       Symptoms none       Duration Continue with 30 min of aerobic exercise without signs/symptoms of physical distress.       Intensity THRR unchanged         Progression   Progression Continue to progress workloads to maintain intensity without signs/symptoms of physical distress.       Average METs 3.13         Resistance Training   Training Prescription Yes       Weight 7 lb       Reps 10-15         Interval Training   Interval Training No         Treadmill   MPH 2.6       Grade 0       Minutes 15       METs 2.99         NuStep   Level 4  T6 and T4       Minutes 15       METs 2.3         Elliptical   Level 2       Speed 3.1       Minutes 15          REL-XR   Level 4       Minutes 15         Home Exercise Plan   Plans to continue exercise at [employer] (comment)  [employer] (classes and independent exercise)       Frequency Add 2 additional days to program exercise sessions.       Initial Home Exercises Provided 11/26/23         Oxygen   Maintain Oxygen Saturation 88% or higher                Exercise Comments:   Exercise Comments     Row Name 10/08/23 0932           Exercise Comments First full day of exercise!  Patient was oriented to gym and equipment including functions,  settings, policies, and procedures.  Patient's individual exercise prescription and treatment plan were reviewed.  All starting workloads were established based on the results of the 6 minute walk test done at initial orientation visit.  The plan for exercise progression was also introduced and progression will be customized based on patient's performance and goals.                Exercise Goals and Review:   Exercise Goals     Row Name 10/02/23 1621             Exercise Goals   Increase Physical Activity Yes       Intervention Provide advice, education, support and counseling about physical activity/exercise needs.;Develop an individualized exercise prescription for aerobic and resistive training based on initial evaluation findings, risk stratification, comorbidities and participant's personal goals.       Expected Outcomes Short Term: Attend rehab on a regular basis to increase amount of physical activity.;Long Term: Add in home exercise to make exercise part of routine and to increase amount of physical activity.;Long Term: Exercising regularly at least 3-5 days a week.       Increase Strength and Stamina Yes       Intervention Provide advice, education, support and counseling about physical activity/exercise needs.;Develop an individualized exercise prescription for aerobic and resistive training based on initial evaluation  findings, risk stratification, comorbidities and participant's personal goals.       Expected Outcomes Short Term: Increase workloads from initial exercise prescription for resistance, speed, and METs.;Short Term: Perform resistance training exercises routinely during rehab and add in resistance training at home;Long Term: Improve cardiorespiratory fitness, muscular endurance and strength as measured by increased METs and functional capacity ( )       Able to understand and use rate of perceived exertion (RPE) scale Yes       Intervention Provide education and explanation on how to use RPE scale       Expected Outcomes Short Term: Able to use RPE daily in rehab to express subjective intensity level;Long Term:  Able to use RPE to guide intensity level when exercising independently       Able to understand and use Dyspnea scale Yes       Intervention Provide education and explanation on how to use Dyspnea scale       Expected Outcomes Short Term: Able to use Dyspnea scale daily in rehab to express subjective sense of shortness of breath during exertion;Long Term: Able to use Dyspnea scale to guide intensity level when exercising independently       Knowledge and understanding of Target Heart Rate Range (THRR) Yes       Intervention Provide education and explanation of THRR including how the numbers were predicted and where they are located for reference       Expected Outcomes Short Term: Able to state/look up THRR;Short Term: Able to use daily as guideline for intensity in rehab;Long Term: Able to use THRR to govern intensity when exercising independently       Able to check pulse independently Yes       Intervention Provide education and demonstration on how to check pulse in carotid and radial arteries.;Review the importance of being able to check your own pulse for safety during independent exercise       Expected Outcomes Short Term: Able to explain why pulse checking is important during  independent exercise;Long Term: Able to check pulse independently and accurately  Understanding of Exercise Prescription Yes       Intervention Provide education, explanation, and written materials on patient's individual exercise prescription       Expected Outcomes Short Term: Able to explain program exercise prescription;Long Term: Able to explain home exercise prescription to exercise independently                Exercise Goals Re-Evaluation :  Exercise Goals Re-Evaluation     Row Name 10/08/23 0932 10/16/23 1633 10/28/23 1526 11/26/23 1002 11/27/23 1408     Exercise Goal Re-Evaluation   Exercise Goals Review Able to understand and use rate of perceived exertion (RPE) scale;Able to understand and use Dyspnea scale;Knowledge and understanding of Target Heart Rate Range (THRR);Understanding of Exercise Prescription Increase Physical Activity;Increase Strength and Stamina;Understanding of Exercise Prescription Increase Physical Activity;Increase Strength and Stamina;Understanding of Exercise Prescription Increase Physical Activity;Able to understand and use Dyspnea scale;Understanding of Exercise Prescription;Increase Strength and Stamina;Knowledge and understanding of Target Heart Rate Range (THRR);Able to understand and use rate of perceived exertion (RPE) scale;Able to check pulse independently Increase Physical Activity;Increase Strength and Stamina;Understanding of Exercise Prescription   Comments Reviewed RPE and dyspnea scale, THR and program prescription with pt today.  Pt voiced understanding and was given a copy of goals to take home. Jonavon is off to a good start in the program. He was able to maintain his prescribed speed on the treadmill, as well as his prescribed level on the T4 nustep. We will continue to monitor his progress in the program. Jervis is doing well in the program. He was recently able to increase his level on the XR from level 3 to 4. He was also able to  increase to level 5 on the recumbent bike. He has maintained his speed on the treadmill, and his level on the T4 nustep. We will continue to monitor his progress in the program. Reviewed home exercise with pt today.  Pt plans to go to the [facility] taking exercise classes and doing independent exercise with the equipment for exercise. He plans on adding 2 additional days of exercise. Reviewed THR, pulse, RPE, sign and symptoms, pulse oximetery and when to call 911 or MD.  Also discussed weather considerations and indoor options.  Pt voiced understanding. Cristofher continues to do well in the program. He has stayed consistent with his workloads on the treadmill and seated machines. He did improve to level 2 on the elliptical while maintaining a speed of 3.1 mph. He also continues to use 7 lb hand weights for resistance training. We will continue to monitor his progress in the program.   Expected Outcomes Short: Use RPE daily to regulate intensity. Long: Follow program prescription in THR. Short: Continue to follow current exercise prescription. Long: Continue exercise to improve strength and stamina. Short: Continue to follow current exercise prescription. Long: Continue exercise to improve strength and stamina. Short: Add 2 additional days of exercise at home. Long: Continue to exercise independenty at home Short: Progressively increase treadmill workload. Long: Continue exercise to improve strength and stamina.    Row Name 12/08/23 1558             Exercise Goal Re-Evaluation   Exercise Goals Review Increase Physical Activity;Increase Strength and Stamina;Understanding of Exercise Prescription       Comments Breydan continues to do well in rehab. He has maintained his workloads on the treadmill, elliptical, XR, and T4 and T6 nusteps. On the treadmill he did a speed of 2.52mph. On the elliptical he  did level 2 with a speed of 3.1. We will continue to monitor his progress in the program.       Expected  Outcomes Short: Progressively increase treadmill speed and elliptical workloads. Long: Continue exercise to improve strength and stamina.                Discharge Exercise Prescription (Final Exercise Prescription Changes):  Exercise Prescription Changes - 12/08/23 1500       Response to Exercise   Blood Pressure (Admit) 128/66    Blood Pressure (Exit) 136/72    Heart Rate (Admit) 74 bpm    Heart Rate (Exercise) 130 bpm    Heart Rate (Exit) 75 bpm    Rating of Perceived Exertion (Exercise) 15    Symptoms none    Duration Continue with 30 min of aerobic exercise without signs/symptoms of physical distress.    Intensity THRR unchanged      Progression   Progression Continue to progress workloads to maintain intensity without signs/symptoms of physical distress.    Average METs 3.13      Resistance Training   Training Prescription Yes    Weight 7 lb    Reps 10-15      Interval Training   Interval Training No      Treadmill   MPH 2.6    Grade 0    Minutes 15    METs 2.99      NuStep   Level 4   T6 and T4   Minutes 15    METs 2.3      Elliptical   Level 2    Speed 3.1    Minutes 15      REL-XR   Level 4    Minutes 15      Home Exercise Plan   Plans to continue exercise at [employer] (comment)   [employer] (classes and independent exercise)   Frequency Add 2 additional days to program exercise sessions.    Initial Home Exercises Provided 11/26/23      Oxygen   Maintain Oxygen Saturation 88% or higher             Nutrition:  Target Goals: Understanding of nutrition guidelines, daily intake of sodium 1500mg , cholesterol 200mg , calories 30% from fat and 7% or less from saturated fats, daily to have 5 or more servings of fruits and vegetables.  Education: All About Nutrition: -Group instruction provided by verbal, written material, interactive activities, discussions, models, and posters to present general guidelines for heart healthy nutrition  including fat, fiber, MyPlate, the role of sodium in heart healthy nutrition, utilization of the nutrition label, and utilization of this knowledge for meal planning. Follow up email sent as well. Written material given at graduation. Flowsheet Row Cardiac Rehab from 12/03/2023 in [facility] Cardiac and Pulmonary Rehab  Education need identified 10/08/23  Date 11/19/23  Educator JG  Instruction Review Code 1- Verbalizes Understanding       Biometrics:  Pre Biometrics - 10/02/23 1621       Pre Biometrics   Height 5' 6.4" (1.687 m)    Weight 144 lb 8 oz (65.5 kg)    Waist Circumference 35 inches    Hip Circumference 36 inches    Waist to Hip Ratio 0.97 %    BMI (Calculated) 23.03    Single Leg Stand 30 seconds              Nutrition Therapy Plan and Nutrition Goals:  Nutrition Therapy & Goals -  10/02/23 1622       Nutrition Therapy   RD appointment deferred Yes      Personal Nutrition Goals   Nutrition Goal RD appointment deferred at this time.      Intervention Plan   Intervention Prescribe, educate and counsel regarding individualized specific dietary modifications aiming towards targeted core components such as weight, hypertension, lipid management, diabetes, heart failure and other comorbidities.;Nutrition handout(s) given to patient.    Expected Outcomes Short Term Goal: Understand basic principles of dietary content, such as calories, fat, sodium, cholesterol and nutrients.;Short Term Goal: A plan has been developed with personal nutrition goals set during dietitian appointment.;Long Term Goal: Adherence to prescribed nutrition plan.             Nutrition Assessments:  MEDIFICTS Score Key: >=70 Need to make dietary changes  40-70 Heart Healthy Diet <= 40 Therapeutic Level Cholesterol Diet  Flowsheet Row Cardiac Rehab from 10/08/2023 in [facility] Cardiac and Pulmonary Rehab  Picture Your Plate Total Score on Admission 43      Picture Your Plate Scores: <14  Unhealthy dietary pattern with much room for improvement. 41-50 Dietary pattern unlikely to meet recommendations for good health and room for improvement. 51-60 More healthful dietary pattern, with some room for improvement.  >60 Healthy dietary pattern, although there may be some specific behaviors that could be improved.    Nutrition Goals Re-Evaluation:  Nutrition Goals Re-Evaluation     Row Name 10/24/23 0926 11/26/23 0951           Goals   Nutrition Goal RD appointment deferred at this time. RD appointment deferred at this time.               Nutrition Goals Discharge (Final Nutrition Goals Re-Evaluation):  Nutrition Goals Re-Evaluation - 11/26/23 0951       Goals   Nutrition Goal RD appointment deferred at this time.             Psychosocial: Target Goals: Acknowledge presence or absence of significant depression and/or stress, maximize coping skills, provide positive support system. Participant is able to verbalize types and ability to use techniques and skills needed for reducing stress and depression.   Education: Stress, Anxiety, and Depression - Group verbal and visual presentation to define topics covered.  Reviews how body is impacted by stress, anxiety, and depression.  Also discusses healthy ways to reduce stress and to treat/manage anxiety and depression.  Written material given at graduation. Flowsheet Row Cardiac Rehab from 12/03/2023 in [facility] Cardiac and Pulmonary Rehab  Education need identified 10/08/23       Education: Sleep Hygiene -Provides group verbal and written instruction about how sleep can affect your health.  Define sleep hygiene, discuss sleep cycles and impact of sleep habits. Review good sleep hygiene tips.    Initial Review & Psychosocial Screening:  Initial Psych Review & Screening - 10/01/23 1344       Initial Review   Current issues with Current Stress Concerns    Source of Stress Concerns Chronic Illness      Family  Dynamics   Good Support System? Yes    Comments He can look to his wife for support. He has been through the program before and has had some issues with his lifestyle. He is ready to get his health back on track.      Barriers   Psychosocial barriers to participate in program The patient should benefit from training in stress management and relaxation.  Screening Interventions   Interventions Program counselor consult;To provide support and resources with identified psychosocial needs;Encouraged to exercise;Provide feedback about the scores to participant    Expected Outcomes Short Term goal: Utilizing psychosocial counselor, staff and physician to assist with identification of specific Stressors or current issues interfering with healing process. Setting desired goal for each stressor or current issue identified.;Long Term Goal: Stressors or current issues are controlled or eliminated.;Short Term goal: Identification and review with participant of any Quality of Life or Depression concerns found by scoring the questionnaire.;Long Term goal: The participant improves quality of Life and PHQ9 Scores as seen by post scores and/or verbalization of changes             Quality of Life Scores:   Scores of 19 and below usually indicate a poorer quality of life in these areas.  A difference of  2-3 points is a clinically meaningful difference.  A difference of 2-3 points in the total score of the Quality of Life Index has been associated with significant improvement in overall quality of life, self-image, physical symptoms, and general health in studies assessing change in quality of life.  PHQ-9: Review Flowsheet       10/02/2023 08/06/2018 05/28/2018  Depression screen PHQ 2/9  Decreased Interest 0 0 2  Down, Depressed, Hopeless 0 0 0  PHQ - 2 Score 0 0 2  Altered sleeping 0 0 0  Tired, decreased energy 0 0 0  Change in appetite 0 0 1  Feeling bad or failure about yourself  1 0 0  Trouble  concentrating 0 0 0  Moving slowly or fidgety/restless 0 0 0  Suicidal thoughts 0 0 0  PHQ-9 Score 1 0 3  Difficult doing work/chores Not difficult at all Not difficult at all Not difficult at all   Interpretation of Total Score  Total Score Depression Severity:  1-4 = Minimal depression, 5-9 = Mild depression, 10-14 = Moderate depression, 15-19 = Moderately severe depression, 20-27 = Severe depression   Psychosocial Evaluation and Intervention:  Psychosocial Evaluation - 10/01/23 1348       Psychosocial Evaluation & Interventions   Interventions Relaxation education;Stress management education;Encouraged to exercise with the program and follow exercise prescription    Comments He can look to his wife for support. He has been through the program before and has had some issues with his lifestyle. He is ready to get his health back on track.    Expected Outcomes Short: Start [facility] to help with mood. Long: Maintain a healthy mental state    Continue Psychosocial Services  Follow up required by staff             Psychosocial Re-Evaluation:  Psychosocial Re-Evaluation     Row Name 10/24/23 [phone] 11/26/23 0950           Psychosocial Re-Evaluation   Current issues with None Identified None Identified      Comments Patient reports no issues with their current mental states, sleep, stress, depression or anxiety. Will follow up with patient in a few weeks for any changes. Patient reports no issues with their current mental states, sleep, stress, depression or anxiety. Will follow up with patient in a few weeks for any changes.      Expected Outcomes Short: Continue to exercise regularly to support mental health and notify staff of any changes. Long: maintain mental health and well being through teaching of rehab or prescribed medications independently. Short: Continue to exercise regularly to support mental  health and notify staff of any changes. Long: maintain mental health and well  being through teaching of rehab or prescribed medications independently.      Interventions Encouraged to attend Cardiac Rehabilitation for the exercise Encouraged to attend Cardiac Rehabilitation for the exercise      Continue Psychosocial Services  Follow up required by staff Follow up required by staff               Psychosocial Discharge (Final Psychosocial Re-Evaluation):  Psychosocial Re-Evaluation - 11/26/23 0950       Psychosocial Re-Evaluation   Current issues with None Identified    Comments Patient reports no issues with their current mental states, sleep, stress, depression or anxiety. Will follow up with patient in a few weeks for any changes.    Expected Outcomes Short: Continue to exercise regularly to support mental health and notify staff of any changes. Long: maintain mental health and well being through teaching of rehab or prescribed medications independently.    Interventions Encouraged to attend Cardiac Rehabilitation for the exercise    Continue Psychosocial Services  Follow up required by staff             Vocational Rehabilitation: Provide vocational rehab assistance to qualifying candidates.   Vocational Rehab Evaluation & Intervention:   Education: Education Goals: Education classes will be provided on a variety of topics geared toward better understanding of heart health and risk factor modification. Participant will state understanding/return demonstration of topics presented as noted by education test scores.  Learning Barriers/Preferences:  Learning Barriers/Preferences - 10/01/23 1343       Learning Barriers/Preferences   Learning Barriers None    Learning Preferences Computer/Internet;Verbal Instruction;Group Instruction;Skilled Demonstration;Video             General Cardiac Education Topics:  AED/CPR: - Group verbal and written instruction with the use of models to demonstrate the basic use of the AED with the basic ABC's of  resuscitation. Flowsheet Row Cardiac Rehab from 08/05/2018 in [facility] Cardiac and Pulmonary Rehab  Date 07/13/18 (P)   Educator CE (P)   Instruction Review Code 1- Verbalizes Understanding (P)        Anatomy and Cardiac Procedures: - Group verbal and visual presentation and models provide information about basic cardiac anatomy and function. Reviews the testing methods done to diagnose heart disease and the outcomes of the test results. Describes the treatment choices: Medical Management, Angioplasty, or Coronary Bypass Surgery for treating various heart conditions including Myocardial Infarction, Angina, Valve Disease, and Cardiac Arrhythmias.  Written material given at graduation. Flowsheet Row Cardiac Rehab from 12/03/2023 in [facility] Cardiac and Pulmonary Rehab  Education need identified 10/08/23       Medication Safety: - Group verbal and visual instruction to review commonly prescribed medications for heart and lung disease. Reviews the medication, class of the drug, and side effects. Includes the steps to properly store meds and maintain the prescription regimen.  Written material given at graduation. Flowsheet Row Cardiac Rehab from 12/03/2023 in [facility] Cardiac and Pulmonary Rehab  Date 12/03/23  Educator SB  Instruction Review Code 1- Verbalizes Understanding       Intimacy: - Group verbal instruction through game format to discuss how heart and lung disease can affect sexual intimacy. Written material given at graduation.. Flowsheet Row Cardiac Rehab from 12/03/2023 in [facility] Cardiac and Pulmonary Rehab  Education need identified 10/08/23       Know Your Numbers and Heart Failure: - Group verbal and visual instruction to  discuss disease risk factors for cardiac and pulmonary disease and treatment options.  Reviews associated critical values for Overweight/Obesity, Hypertension, Cholesterol, and Diabetes.  Discusses basics of heart failure: signs/symptoms and treatments.  Introduces Heart  Failure Zone chart for action plan for heart failure.  Written material given at graduation. Flowsheet Row Cardiac Rehab from 12/03/2023 in [facility] Cardiac and Pulmonary Rehab  Education need identified 10/08/23       Infection Prevention: - Provides verbal and written material to individual with discussion of infection control including proper hand washing and proper equipment cleaning during exercise session. Flowsheet Row Cardiac Rehab from 10/08/2023 in [facility] Cardiac and Pulmonary Rehab  Date 10/02/23  Educator MB  Instruction Review Code 1- Verbalizes Understanding       Falls Prevention: - Provides verbal and written material to individual with discussion of falls prevention and safety. Flowsheet Row Cardiac Rehab from 10/08/2023 in [facility] Cardiac and Pulmonary Rehab  Date 10/02/23  Educator MB  Instruction Review Code 1- Verbalizes Understanding       Other: -Provides group and verbal instruction on various topics (see comments)   Knowledge Questionnaire Score:  Knowledge Questionnaire Score - 10/08/23 1205       Knowledge Questionnaire Score   Pre Score 18/26             Core Components/Risk Factors/Patient Goals at Admission:  Personal Goals and Risk Factors at Admission - 10/02/23 1623       Core Components/Risk Factors/Patient Goals on Admission    Weight Management Yes;Weight Maintenance    Intervention Weight Management: Develop a combined nutrition and exercise program designed to reach desired caloric intake, while maintaining appropriate intake of nutrient and fiber, sodium and fats, and appropriate energy expenditure required for the weight goal.;Weight Management: Provide education and appropriate resources to help participant work on and attain dietary goals.;Weight Management/Obesity: Establish reasonable short term and long term weight goals.    Admit Weight 144 lb 8 oz (65.5 kg)    Goal Weight: Short Term 144 lb 8 oz (65.5 kg)    Goal Weight: Long  Term 144 lb 8 oz (65.5 kg)    Expected Outcomes Long Term: Adherence to nutrition and physical activity/exercise program aimed toward attainment of established weight goal;Weight Maintenance: Understanding of the daily nutrition guidelines, which includes 25-35% calories from fat, 7% or less cal from saturated fats, less than 200mg  cholesterol, less than 1.5gm of sodium, & 5 or more servings of fruits and vegetables daily;Short Term: Continue to assess and modify interventions until short term weight is achieved;Understanding recommendations for meals to include 15-35% energy as protein, 25-35% energy from fat, 35-60% energy from carbohydrates, less than 200mg  of dietary cholesterol, 20-35 gm of total fiber daily;Understanding of distribution of calorie intake throughout the day with the consumption of 4-5 meals/snacks    Hypertension Yes    Intervention Provide education on lifestyle modifcations including regular physical activity/exercise, weight management, moderate sodium restriction and increased consumption of fresh fruit, vegetables, and low fat dairy, alcohol moderation, and smoking cessation.;Monitor prescription use compliance.    Expected Outcomes Long Term: Maintenance of blood pressure at goal levels.;Short Term: Continued assessment and intervention until BP is < 140/80mm HG in hypertensive participants. < 130/109mm HG in hypertensive participants with diabetes, heart failure or chronic kidney disease.    Lipids Yes    Intervention Provide education and support for participant on nutrition & aerobic/resistive exercise along with prescribed medications to achieve LDL 70mg , HDL >40mg .    Expected  Outcomes Short Term: Participant states understanding of desired cholesterol values and is compliant with medications prescribed. Participant is following exercise prescription and nutrition guidelines.;Long Term: Cholesterol controlled with medications as prescribed, with individualized exercise RX and  with personalized nutrition plan. Value goals: LDL < 70mg , HDL > 40 mg.             Education:Diabetes - Individual verbal and written instruction to review signs/symptoms of diabetes, desired ranges of glucose level fasting, after meals and with exercise. Acknowledge that pre and post exercise glucose checks will be done for 3 sessions at entry of program.   Core Components/Risk Factors/Patient Goals Review:   Goals and Risk Factor Review     Row Name 10/24/23 0925 11/26/23 0951           Core Components/Risk Factors/Patient Goals Review   Personal Goals Review Weight Management/Obesity;Hypertension Weight Management/Obesity;Hypertension      Review Timothee has a blood pressure machine at home and does not check it. Informed him to check it at least once a day to make sure it is correlating with his rehab readings. He states he will start doing so. Jarid is wanting to maintain his weight. He has no questions on his medications at this time. His blood pressure has been good and has been checking his blood pressure at home.      Expected Outcomes Short: start checking blood pressures at home. Long: maintain blood pressure readings independently. Short: continue to attent Rehab. Long: maintain weight and blood pressure independently.               Core Components/Risk Factors/Patient Goals at Discharge (Final Review):   Goals and Risk Factor Review - 11/26/23 0951       Core Components/Risk Factors/Patient Goals Review   Personal Goals Review Weight Management/Obesity;Hypertension    Review Orton is wanting to maintain his weight. He has no questions on his medications at this time. His blood pressure has been good and has been checking his blood pressure at home.    Expected Outcomes Short: continue to attent Rehab. Long: maintain weight and blood pressure independently.             ITP Comments:  ITP Comments     Row Name 10/01/23 1342 10/02/23 1614 10/08/23 0931  10/15/23 1122 11/12/23 1440   ITP Comments Virtual Visit completed. Patient informed on EP and RD appointment and 6 Minute walk test. Patient also informed of patient health questionnaires on My Chart. Patient Verbalizes understanding. Visit diagnosis can be found in [facility] 08/31/2023. Completed and gym orientation. Initial ITP created and sent for review to Dr. Bethann Punches, Medical Director. First full day of exercise!  Patient was oriented to gym and equipment including functions, settings, policies, and procedures.  Patient's individual exercise prescription and treatment plan were reviewed.  All starting workloads were established based on the results of the 6 minute walk test done at initial orientation visit.  The plan for exercise progression was also introduced and progression will be customized based on patient's performance and goals. 30 Day review completed. Medical Director ITP review done, changes made as directed, and signed approval by Medical Director.    new to program 30 Day review completed. Medical Director ITP review done, changes made as directed, and signed approval by Medical Director.    new to program    Row Name 12/10/23 1145           ITP Comments 30 Day review completed. Medical  Director ITP review done, changes made as directed, and signed approval by Wellsite geologist.                Comments:

## 2023-12-12 VITALS — Ht 66.4 in | Wt 151.5 lb

## 2023-12-12 DIAGNOSIS — Z48812 Encounter for surgical aftercare following surgery on the circulatory system: Secondary | ICD-10-CM | POA: Diagnosis not present

## 2023-12-12 DIAGNOSIS — I213 ST elevation (STEMI) myocardial infarction of unspecified site: Secondary | ICD-10-CM

## 2023-12-12 NOTE — Patient Instructions (Signed)
Discharge Patient Instructions  Patient Details  Name: Talton Delpriore MRN: [id number] Date of Birth: [birth year] Referring Provider:  Elane Fritz, MD   Number of Visits: 36  Reason for Discharge:  Patient reached a stable level of exercise. Patient independent in their exercise. Patient has met program and personal goals.  Diagnosis:  ST elevation myocardial infarction (STEMI), unspecified artery [facility])  Initial Exercise Prescription:  Initial Exercise Prescription - 10/02/23 1600       Date of Initial Exercise RX and Referring Provider   Date 10/02/23    Referring Provider Dorothyann Peng, MD      Oxygen   Maintain Oxygen Saturation 88% or higher      Treadmill   MPH 2.5    Grade 1    Minutes 15    METs 3.26      Recumbant Bike   Level 4    RPM 50    Watts 52    Minutes 15    METs 4.51      NuStep   Level 4    SPM 80    Minutes 15    METs 4.51      Elliptical   Level 1    Speed 3    Minutes 15    METs 4.51      REL-XR   Level 3    Speed 50    Minutes 15    METs 4.51      Prescription Details   Frequency (times per week) 3    Duration Progress to 30 minutes of continuous aerobic without signs/symptoms of physical distress      Intensity   THRR 40-80% of Max Heartrate 101-136    Ratings of Perceived Exertion 11-13    Perceived Dyspnea 0-4      Progression   Progression Continue to progress workloads to maintain intensity without signs/symptoms of physical distress.      Resistance Training   Training Prescription Yes    Weight 7 lb    Reps 10-15             Discharge Exercise Prescription (Final Exercise Prescription Changes):  Exercise Prescription Changes - 12/08/23 1500       Response to Exercise   Blood Pressure (Admit) 128/66    Blood Pressure (Exit) 136/72    Heart Rate (Admit) 74 bpm    Heart Rate (Exercise) 130 bpm    Heart Rate (Exit) 75 bpm    Rating of Perceived Exertion (Exercise) 15    Symptoms none     Duration Continue with 30 min of aerobic exercise without signs/symptoms of physical distress.    Intensity THRR unchanged      Progression   Progression Continue to progress workloads to maintain intensity without signs/symptoms of physical distress.    Average METs 3.13      Resistance Training   Training Prescription Yes    Weight 7 lb    Reps 10-15      Interval Training   Interval Training No      Treadmill   MPH 2.6    Grade 0    Minutes 15    METs 2.99      NuStep   Level 4   T6 and T4   Minutes 15    METs 2.3      Elliptical   Level 2    Speed 3.1    Minutes 15      REL-XR   Level 4  Minutes 15      Home Exercise Plan   Plans to continue exercise at [facility] (comment)   Blake Divine (classes and independent exercise)   Frequency Add 2 additional days to program exercise sessions.    Initial Home Exercises Provided 11/26/23      Oxygen   Maintain Oxygen Saturation 88% or higher             Functional Capacity:  6 Minute Walk     Row Name 10/02/23 1614 12/12/23 0952       6 Minute Walk   Phase Initial Discharge    Distance 1615 feet 1520 feet    Distance % Change -- -5.9 %    Distance Feet Change -- -95 ft    Walk Time 6 minutes 6 minutes    # of Rest Breaks 0 0    MPH 3.1 2.88    METS 4.51 3.92    RPE 13 13    Perceived Dyspnea  0 0    VO2 Peak 15.77 13.71    Symptoms No No    Resting HR 67 bpm 64 bpm    Resting BP 166/86 126/64    Resting Oxygen Saturation  97 % 98 %    Exercise Oxygen Saturation  during 6 min walk 100 % 94 %    Max Ex. HR 115 bpm 102 bpm    Max Ex. BP 184/72 160/72    2 Minute Post BP 158/84 --            Nutrition & Weight - Outcomes:  Pre Biometrics - 10/02/23 1621       Pre Biometrics   Height 5' 6.4" (1.687 m)    Weight 144 lb 8 oz (65.5 kg)    Waist Circumference 35 inches    Hip Circumference 36 inches    Waist to Hip Ratio 0.97 %    BMI (Calculated) 23.03    Single Leg Stand 30 seconds              Post Biometrics - 12/12/23 0955        Post  Biometrics   Height 5' 6.4" (1.687 m)    Weight 151 lb 8 oz (68.7 kg)    Waist Circumference 34 inches    Hip Circumference 36 inches    Waist to Hip Ratio 0.94 %    BMI (Calculated) 24.15    Single Leg Stand 30 seconds             Nutrition:  Nutrition Therapy & Goals - 10/02/23 1622       Nutrition Therapy   RD appointment deferred Yes      Personal Nutrition Goals   Nutrition Goal RD appointment deferred at this time.      Intervention Plan   Intervention Prescribe, educate and counsel regarding individualized specific dietary modifications aiming towards targeted core components such as weight, hypertension, lipid management, diabetes, heart failure and other comorbidities.;Nutrition handout(s) given to patient.    Expected Outcomes Short Term Goal: Understand basic principles of dietary content, such as calories, fat, sodium, cholesterol and nutrients.;Short Term Goal: A plan has been developed with personal nutrition goals set during dietitian appointment.;Long Term Goal: Adherence to prescribed nutrition plan.

## 2023-12-12 NOTE — Progress Notes (Signed)
Daily Session Note  Patient Details  Name: Mark Mack MRN: [id number] Date of Birth: 12/15/1955 Referring Provider:   Flowsheet Row Cardiac Rehab from 10/02/2023 in [facility] Cardiac and Pulmonary Rehab  Referring Provider Dorothyann Peng, MD       Encounter Date: 12/12/2023  Check In:  Session Check In - 12/12/23 0908       Check-In   Supervising physician immediately available to respond to emergencies See telemetry face sheet for immediately available ER MD    Location [facility]-Cardiac & Pulmonary Rehab    Staff Present Kelton Pillar RN,BSN,MPA;Noah Tickle, BS, Exercise Physiologist;Joseph Hollace Kinnier    Virtual Visit No    Medication changes reported     No    Fall or balance concerns reported    No    Tobacco Cessation No Change    Warm-up and Cool-down Performed on first and last piece of equipment    Resistance Training Performed Yes    VAD Patient? No    PAD/SET Patient? No      Pain Assessment   Currently in Pain? No/denies                Social History   Tobacco Use  Smoking Status Former   Current packs/day: 0.00   Types: Cigarettes   Quit date: 03/23/2018   Years since quitting: 5.7  Smokeless Tobacco Never    Goals Met:  Independence with exercise equipment Exercise tolerated well No report of concerns or symptoms today Strength training completed today  Goals Unmet:  Not Applicable  Comments: Pt able to follow exercise prescription today without complaint.  Will continue to monitor for progression.    Dr. Bethann Punches is Medical Director for [facility] Cardiac Rehabilitation.  Dr. Vida Rigger is Medical Director for [facility] Pulmonary Rehabilitation.

## 2023-12-15 ENCOUNTER — Encounter: Payer: Medicare HMO | Admitting: *Deleted

## 2023-12-15 DIAGNOSIS — I213 ST elevation (STEMI) myocardial infarction of unspecified site: Secondary | ICD-10-CM

## 2023-12-15 DIAGNOSIS — Z48812 Encounter for surgical aftercare following surgery on the circulatory system: Secondary | ICD-10-CM | POA: Diagnosis not present

## 2023-12-15 NOTE — Progress Notes (Signed)
Daily Session Note  Patient Details  Name: Mark Mack MRN: [id number] Date of Birth: 1956-06-11 Referring Provider:   Flowsheet Row Cardiac Rehab from 10/02/2023 in [facility] Cardiac and Pulmonary Rehab  Referring Provider Dorothyann Peng, MD       Encounter Date: 12/15/2023  Check In:  Session Check In - 12/15/23 0942       Check-In   Supervising physician immediately available to respond to emergencies See telemetry face sheet for immediately available ER MD    Location [facility]-Cardiac & Pulmonary Rehab    Staff Present Cora Collum, RN, BSN, CCRP;Kelly Hayes BS, ACSM CEP, Exercise Physiologist;Maxon Conetta BS, Exercise Physiologist;Jason Wallace Cullens RDN,LDN;Joseph [employer] Visit No    Medication changes reported     No    Fall or balance concerns reported    No    Warm-up and Cool-down Performed on first and last piece of equipment    Resistance Training Performed Yes    VAD Patient? No    PAD/SET Patient? No      Pain Assessment   Currently in Pain? No/denies                Social History   Tobacco Use  Smoking Status Former   Current packs/day: 0.00   Types: Cigarettes   Quit date: 03/23/2018   Years since quitting: 5.7  Smokeless Tobacco Never    Goals Met:  Independence with exercise equipment Exercise tolerated well No report of concerns or symptoms today  Goals Unmet:  Not Applicable  Comments: Pt able to follow exercise prescription today without complaint.  Will continue to monitor for progression.    Dr. Bethann Punches is Medical Director for [facility] Cardiac Rehabilitation.  Dr. Vida Rigger is Medical Director for [facility] Pulmonary Rehabilitation.

## 2023-12-17 ENCOUNTER — Encounter: Payer: Medicare HMO | Admitting: *Deleted

## 2023-12-17 DIAGNOSIS — I213 ST elevation (STEMI) myocardial infarction of unspecified site: Secondary | ICD-10-CM

## 2023-12-17 DIAGNOSIS — Z48812 Encounter for surgical aftercare following surgery on the circulatory system: Secondary | ICD-10-CM | POA: Diagnosis not present

## 2023-12-17 NOTE — Progress Notes (Deleted)
Discharge Note for  Mark Mack     [birth year]        Mark Mack graduated today from  rehab with 36 sessions completed.  Details of the patient's exercise prescription and what He needs to do in order to continue the prescription and progress were discussed with patient.  Patient was given a copy of prescription and goals.  Patient verbalized understanding. Mark Mack plans to continue to exercise by joininf the [employer].    6 Minute Walk     Row Name 10/02/23 1614 12/12/23 0952       6 Minute Walk   Phase Initial Discharge    Distance 1615 feet 1520 feet    Distance % Change -- -5.9 %    Distance Feet Change -- -95 ft    Walk Time 6 minutes 6 minutes    # of Rest Breaks 0 0    MPH 3.1 2.88    METS 4.51 3.92    RPE 13 13    Perceived Dyspnea  0 0    VO2 Peak 15.77 13.71    Symptoms No No    Resting HR 67 bpm 64 bpm    Resting BP 166/86 126/64    Resting Oxygen Saturation  97 % 98 %    Exercise Oxygen Saturation  during 6 min walk 100 % 94 %    Max Ex. HR 115 bpm 102 bpm    Max Ex. BP 184/72 160/72    2 Minute Post BP 158/84 --

## 2023-12-17 NOTE — Progress Notes (Signed)
Daily Session Note  Patient Details  Name: Mark Mack MRN: [id number] Date of Birth: April 09, 1956 Referring Provider:   Flowsheet Row Cardiac Rehab from 10/02/2023 in [facility] Cardiac and Pulmonary Rehab  Referring Provider Dorothyann Peng, MD       Encounter Date: 12/17/2023  Check In:  Session Check In - 12/17/23 0957       Check-In   Supervising physician immediately available to respond to emergencies See telemetry face sheet for immediately available ER MD    Location [facility]-Cardiac & Pulmonary Rehab    Staff Present Cora Collum, RN, BSN, CCRP;Margaret Best, MS, Exercise Physiologist;Maxon Conetta BS, Exercise Physiologist;Joseph Reino Kent RCP,RRT,BSRT    Virtual Visit No    Medication changes reported     No    Fall or balance concerns reported    No    Warm-up and Cool-down Performed on first and last piece of equipment    Resistance Training Performed Yes    VAD Patient? No    PAD/SET Patient? No      Pain Assessment   Currently in Pain? No/denies                Social History   Tobacco Use  Smoking Status Former   Current packs/day: 0.00   Types: Cigarettes   Quit date: 03/23/2018   Years since quitting: 5.7  Smokeless Tobacco Never    Goals Met:  Independence with exercise equipment Exercise tolerated well No report of concerns or symptoms today  Goals Unmet:  Not Applicable  Comments:  Pt able to follow exercise prescription today without complaint.  Will continue to monitor for progression.    Dr. Bethann Punches is Medical Director for [facility] Cardiac Rehabilitation.  Dr. Vida Rigger is Medical Director for [facility] Pulmonary Rehabilitation.

## 2023-12-22 ENCOUNTER — Encounter: Payer: Medicare HMO | Admitting: *Deleted

## 2023-12-22 DIAGNOSIS — Z48812 Encounter for surgical aftercare following surgery on the circulatory system: Secondary | ICD-10-CM | POA: Diagnosis not present

## 2023-12-22 DIAGNOSIS — I213 ST elevation (STEMI) myocardial infarction of unspecified site: Secondary | ICD-10-CM

## 2023-12-22 NOTE — Progress Notes (Signed)
 Cardiac Individual Treatment Plan  Patient Details  Name: Mark Mack MRN: [id number] Date of Birth: [birth year] Referring Provider:   Flowsheet Row Cardiac Rehab from 10/02/2023 in [facility] Cardiac and Pulmonary Rehab  Referring Provider Dorothyann Peng, MD       Initial Encounter Date:  Flowsheet Row Cardiac Rehab from 10/02/2023 in [facility] Cardiac and Pulmonary Rehab  Date 10/02/23       Visit Diagnosis: ST elevation myocardial infarction (STEMI), unspecified artery (HCC)  Patient's Home Medications on Admission:  Current Outpatient Medications:    acetaminophen (TYLENOL) 325 MG tablet, Take 1-2 tablets (325-650 mg total) by mouth every 4 (four) hours as needed for mild pain (pain score 1-3). (Patient not taking: Reported on 10/01/2023), Disp: , Rfl:    aspirin EC 81 MG tablet, Take 1 tablet (81 mg total) by mouth daily. Swallow whole., Disp: 100 tablet, Rfl: 0   atorvastatin (LIPITOR) 40 MG tablet, Take 1 tablet (40 mg total) by mouth daily., Disp: 30 tablet, Rfl: 0   atorvastatin (LIPITOR) 40 MG tablet, Take 1 tablet by mouth daily. (Patient not taking: Reported on 10/01/2023), Disp: , Rfl:    clopidogrel (PLAVIX) 75 MG tablet, Take 1 tablet (75 mg total) by mouth daily. (Patient not taking: Reported on 10/01/2023), Disp: 30 tablet, Rfl: 0   clopidogrel (PLAVIX) 75 MG tablet, Take 1 tablet by mouth daily. (Patient not taking: Reported on 10/01/2023), Disp: , Rfl:    L-METHYLFOLATE CALCIUM PO, Take 1 tablet by mouth daily. (Patient not taking: Reported on 10/01/2023), Disp: , Rfl:    l-methylfolate-B6-B12 (METANX) 3-35-2 MG TABS tablet, Take 1 tablet by mouth daily., Disp: 60 tablet, Rfl: 0   MAGnesium-Oxide 400 (240 Mg) MG tablet, Take 0.5 tablets (200 mg total) by mouth at bedtime., Disp: 30 tablet, Rfl: 0   metoprolol succinate (TOPROL-XL) 25 MG 24 hr tablet, Take 0.5 tablets (12.5 mg total) by mouth daily., Disp: 30 tablet, Rfl: 0   Multiple Vitamin (MULTIVITAMIN WITH MINERALS) TABS tablet,  Take 1 tablet by mouth daily. (Patient not taking: Reported on 10/01/2023), Disp: , Rfl:    naltrexone (DEPADE) 50 MG tablet, Take by mouth. (Patient not taking: Reported on 10/01/2023), Disp: , Rfl:    nitroGLYCERIN (NITROSTAT) 0.4 MG SL tablet, Place 1 tablet (0.4 mg total) under the tongue every 5 (five) minutes as needed for chest pain. Can use up to three times as needed. You need to call MD if you need to use two or more pills., Disp: 30 tablet, Rfl: 0   pantoprazole (PROTONIX) 40 MG tablet, Take 1 tablet (40 mg total) by mouth at bedtime., Disp: 30 tablet, Rfl: 0   pantoprazole (PROTONIX) 40 MG tablet, Take by mouth. (Patient not taking: Reported on 10/01/2023), Disp: , Rfl:    silver sulfADIAZINE (SILVADENE) 1 % cream, Apply topically 2 (two) times daily. (Patient not taking: Reported on 10/01/2023), Disp: 50 g, Rfl: 0   Telmisartan-amLODIPine 80-10 MG TABS, SMARTSIG:1.0 Tablet(s) By Mouth Daily (Patient not taking: Reported on 10/01/2023), Disp: , Rfl:    thiamine (VITAMIN B1) 100 MG tablet, Take 1 tablet (100 mg total) by mouth daily. (Patient not taking: Reported on 10/01/2023), Disp: 30 tablet, Rfl: 0   traZODone (DESYREL) 50 MG tablet, Take 1 tablet (50 mg total) by mouth at bedtime. (Patient not taking: Reported on 10/01/2023), Disp: 30 tablet, Rfl: 0   traZODone (DESYREL) 50 MG tablet, Take by mouth. (Patient not taking: Reported on 10/01/2023), Disp: , Rfl:    vitamin D3 (CHOLECALCIFEROL)  25 MCG tablet, Take 3 tablets (3,000 Units total) by mouth daily., Disp: 90 tablet, Rfl: 0  Past Medical History: Past Medical History:  Diagnosis Date   CAD (coronary artery disease)    Hypertension    OA (osteoarthritis) of hip    bilateral hips   Tobacco use     Tobacco Use: Social History   Tobacco Use  Smoking Status Former   Current packs/day: 0.00   Types: Cigarettes   Quit date: 03/23/2018   Years since quitting: 5.7  Smokeless Tobacco Never    Labs: Review Flowsheet       Latest  Ref Rng & Units 08/31/2023 09/01/2023  Labs for ITP Cardiac and Pulmonary Rehab  Cholestrol 0 - 200 mg/dL 081  -  LDL (calc) 0 - 99 mg/dL 57  -  HDL-C >44 mg/dL 62  -  Trlycerides <818 mg/dL 563  72   Hemoglobin J4H 4.8 - 5.6 % 5.8  -  PH, Arterial 7.35 - 7.45 7.08  7.12  7.06  7.08  6.867  7.46  7.39  7.27  7.2   PCO2 arterial 32 - 48 mmHg 51  48  56  56  75.8  40  48  52  54   Bicarbonate 20.0 - 28.0 mmol/L 15.1  15.6  15.9  16.9  16.6  15.0  13.8  28.4  29.1  23.9  21.1   TCO2 22 - 32 mmol/L 22 - 32 mmol/L 18  16  -  Acid-base deficit 0.0 - 2.0 mmol/L 14.9  13.6  14.6  15.0  13.8  20.0  20.0  3.6  7.4   O2 Saturation % 88.9  96.6  71.4  97.1  74.4  78.4  33  69  93.5  91.3  91.8  98     Details       Multiple values from one day are sorted in reverse-chronological order          Exercise Target Goals: Exercise Program Goal: Individual exercise prescription set using results from initial 6 min walk test and THRR while considering  patient's activity barriers and safety.   Exercise Prescription Goal: Initial exercise prescription builds to 30-45 minutes a day of aerobic activity, 2-3 days per week.  Home exercise guidelines will be given to patient during program as part of exercise prescription that the participant will acknowledge.   Education: Aerobic Exercise: - Group verbal and visual presentation on the components of exercise prescription. Introduces F.I.T.T principle from ACSM for exercise prescriptions.  Reviews F.I.T.T. principles of aerobic exercise including progression. Written material given at graduation. Flowsheet Row Cardiac Rehab from 08/05/2018 in [facility] Cardiac and Pulmonary Rehab  Date 06/01/18 (P)   Educator JH (P)   Instruction Review Code 1- Verbalizes Understanding (P)        Education: Resistance Exercise: - Group verbal and visual presentation on the components of exercise prescription. Introduces F.I.T.T principle from ACSM for exercise prescriptions   Reviews F.I.T.T. principles of resistance exercise including progression. Written material given at graduation. Flowsheet Row Cardiac Rehab from 12/10/2023 in [facility] Cardiac and Pulmonary Rehab  Education need identified 10/08/23        Education: Exercise & Equipment Safety: - Individual verbal instruction and demonstration of equipment use and safety with use of the equipment. Flowsheet Row Cardiac Rehab from 10/08/2023 in [facility] Cardiac and Pulmonary Rehab  Date 10/02/23  Educator MB  Instruction Review Code 1- [employer] Understanding       Education:  Exercise Physiology & General Exercise Guidelines: - Group verbal and written instruction with models to review the exercise physiology of the cardiovascular system and associated critical values. Provides general exercise guidelines with specific guidelines to those with heart or lung disease.  Flowsheet Row Cardiac Rehab from 12/10/2023 in [facility] Cardiac and Pulmonary Rehab  Education need identified 10/08/23       Education: Flexibility, Balance, Mind/Body Relaxation: - Group verbal and visual presentation with interactive activity on the components of exercise prescription. Introduces F.I.T.T principle from ACSM for exercise prescriptions. Reviews F.I.T.T. principles of flexibility and balance exercise training including progression. Also discusses the mind body connection.  Reviews various relaxation techniques to help reduce and manage stress (i.e. Deep breathing, progressive muscle relaxation, and visualization). Balance handout provided to take home. Written material given at graduation. Flowsheet Row Cardiac Rehab from 08/05/2018 in [facility] Cardiac and Pulmonary Rehab  Date 08/03/18 (P)   Educator KH (P)   Instruction Review Code 1- Verbalizes Understanding (P)        Activity Barriers & Risk Stratification:  Activity Barriers & Cardiac Risk Stratification - 10/02/23 1616       Activity Barriers & Cardiac Risk Stratification    Activity Barriers None    Cardiac Risk Stratification High             6 Minute Walk:  6 Minute Walk     Row Name 10/02/23 1614 12/12/23 0952       6 Minute Walk   Phase Initial Discharge    Distance 1615 feet 1520 feet    Distance % Change -- -5.9 %    Distance Feet Change -- -95 ft    Walk Time 6 minutes 6 minutes    # of Rest Breaks 0 0    MPH 3.1 2.88    METS 4.51 3.92    RPE 13 13    Perceived Dyspnea  0 0    VO2 Peak 15.77 13.71    Symptoms No No    Resting HR 67 bpm 64 bpm    Resting BP 166/86 126/64    Resting Oxygen Saturation  97 % 98 %    Exercise Oxygen Saturation  during 6 min walk 100 % 94 %    Max Ex. HR 115 bpm 102 bpm    Max Ex. BP 184/72 160/72    2 Minute Post BP 158/84 --             Oxygen Initial Assessment:   Oxygen Re-Evaluation:   Oxygen Discharge (Final Oxygen Re-Evaluation):   Initial Exercise Prescription:  Initial Exercise Prescription - 10/02/23 1600       Date of Initial Exercise RX and Referring Provider   Date 10/02/23    Referring Provider Dorothyann Peng, MD      Oxygen   Maintain Oxygen Saturation 88% or higher      Treadmill   MPH 2.5    Grade 1    Minutes 15    METs 3.26      Recumbant Bike   Level 4    RPM 50    Watts 52    Minutes 15    METs 4.51      NuStep   Level 4    SPM 80    Minutes 15    METs 4.51      Elliptical   Level 1    Speed 3    Minutes 15    METs 4.51  REL-XR   Level 3    Speed 50    Minutes 15    METs 4.51      Prescription Details   Frequency (times per week) 3    Duration Progress to 30 minutes of continuous aerobic without signs/symptoms of physical distress      Intensity   THRR 40-80% of Max Heartrate 101-136    Ratings of Perceived Exertion 11-13    Perceived Dyspnea 0-4      Progression   Progression Continue to progress workloads to maintain intensity without signs/symptoms of physical distress.      Resistance Training   Training Prescription  Yes    Weight 7 lb    Reps 10-15             Perform Capillary Blood Glucose checks as needed.  Exercise Prescription Changes:   Exercise Prescription Changes     Row Name 10/02/23 1600 10/16/23 1600 10/28/23 1500 11/26/23 1000 11/27/23 1400     Response to Exercise   Blood Pressure (Admit) 166/86 108/62 110/66 -- 128/70   Blood Pressure (Exercise) 184/72 162/70 164/78 -- 162/78   Blood Pressure (Exit) 158/84 112/60 108/60 -- 124/68   Heart Rate (Admit) 67 bpm 72 bpm 85 bpm -- 74 bpm   Heart Rate (Exercise) 115 bpm 130 bpm 139 bpm -- 137 bpm   Heart Rate (Exit) 65 bpm 91 bpm 72 bpm -- 67 bpm   Oxygen Saturation (Admit) 97 % -- -- -- --   Oxygen Saturation (Exercise) 100 % -- -- -- --   Oxygen Saturation (Exit) 97 % -- -- -- --   Rating of Perceived Exertion (Exercise) 13 13 14  -- 15   Perceived Dyspnea (Exercise) 0 0 0 -- --   Symptoms none none none -- none   Comments results -- -- -- --   Duration Progress to 30 minutes of  aerobic without signs/symptoms of physical distress Progress to 30 minutes of  aerobic without signs/symptoms of physical distress Progress to 30 minutes of  aerobic without signs/symptoms of physical distress -- Continue with 30 min of aerobic exercise without signs/symptoms of physical distress.   Intensity THRR New THRR New THRR New -- THRR unchanged     Progression   Progression Continue to progress workloads to maintain intensity without signs/symptoms of physical distress. Continue to progress workloads to maintain intensity without signs/symptoms of physical distress. Continue to progress workloads to maintain intensity without signs/symptoms of physical distress. -- Continue to progress workloads to maintain intensity without signs/symptoms of physical distress.   Average METs 4.51 3.43 3.2 -- 3.77     Resistance Training   Training Prescription -- Yes Yes -- Yes   Weight -- 7 lb 7 lb -- 7 lb   Reps -- 10-15 10-15 -- 10-15     Interval  Training   Interval Training -- No No -- No     Treadmill   MPH -- 2.7 2.7 -- 2.6   Grade -- 0 0 -- 0   Minutes -- 15 15 -- 15   METs -- 3.07 3.07 -- 2.99     Recumbant Bike   Level -- -- 5 -- 4   Watts -- -- 37 -- 30   Minutes -- -- 15 -- 15   METs -- -- 3.77 -- 3.4     NuStep   Level -- 4 3 -- 4   Minutes -- 15 15 -- 15   METs -- 4.3 2.2 --  2.1     Elliptical   Level -- 1 -- -- 2   Speed -- 3.1 -- -- 3.1   Minutes -- 15 -- -- 15     REL-XR   Level -- -- 4 -- 4   Minutes -- -- 15 -- 15   METs -- -- 5 -- 5.5     T5 Nustep   Level -- -- 4 -- --   Minutes -- -- 15 -- --   METs -- -- 2.1 -- --     Home Exercise Plan   Plans to continue exercise at -- -- -- [employer] (comment)  [employer] (classes and independent exercise) [employer] (comment)  [facility] (classes and independent exercise)   Frequency -- -- -- Add 2 additional days to program exercise sessions. Add 2 additional days to program exercise sessions.   Initial Home Exercises Provided -- -- -- 11/26/23 11/26/23     Oxygen   Maintain Oxygen Saturation -- 88% or higher 88% or higher -- 88% or higher    Row Name 12/08/23 1500             Response to Exercise   Blood Pressure (Admit) 128/66       Blood Pressure (Exit) 136/72       Heart Rate (Admit) 74 bpm       Heart Rate (Exercise) 130 bpm       Heart Rate (Exit) 75 bpm       Rating of Perceived Exertion (Exercise) 15       Symptoms none       Duration Continue with 30 min of aerobic exercise without signs/symptoms of physical distress.       Intensity THRR unchanged         Progression   Progression Continue to progress workloads to maintain intensity without signs/symptoms of physical distress.       Average METs 3.13         Resistance Training   Training Prescription Yes       Weight 7 lb       Reps 10-15         Interval Training   Interval Training No         Treadmill   MPH 2.6       Grade 0       Minutes 15        METs 2.99         NuStep   Level 4  T6 and T4       Minutes 15       METs 2.3         Elliptical   Level 2       Speed 3.1       Minutes 15         REL-XR   Level 4       Minutes 15         Home Exercise Plan   Plans to continue exercise at [employer] (comment)  [employer] (classes and independent exercise)       Frequency Add 2 additional days to program exercise sessions.       Initial Home Exercises Provided 11/26/23         Oxygen   Maintain Oxygen Saturation 88% or higher                Exercise Comments:   Exercise Comments     Row Name 10/08/23 0932 12/17/23 0958 12/22/23  1610       Exercise Comments First full day of exercise!  Patient was oriented to gym and equipment including functions, settings, policies, and procedures.  Patient's individual exercise prescription and treatment plan were reviewed.  All starting workloads were established based on the results of the 6 minute walk test done at initial orientation visit.  The plan for exercise progression was also introduced and progression will be customized based on patient's performance and goals. Clance graduated today from  rehab with 36 sessions completed.  Details of the patient's exercise prescription and what He needs to do in order to continue the prescription and progress were discussed with patient.  Patient was given a copy of prescription and goals.  Patient verbalized understanding. Ying plans to continue to exercise by joininf the [employer]. Larico graduated today from  rehab with 36 sessions completed.  Details of the patient's exercise prescription and what He needs to do in order to continue the prescription and progress were discussed with patient.  Patient was given a copy of prescription and goals.  Patient verbalized understanding. Brax plans to continue to exercise by exercising at the [employer] at [facility].              Exercise Goals and  Review:   Exercise Goals     Row Name 10/02/23 1621             Exercise Goals   Increase Physical Activity Yes       Intervention Provide advice, education, support and counseling about physical activity/exercise needs.;Develop an individualized exercise prescription for aerobic and resistive training based on initial evaluation findings, risk stratification, comorbidities and participant's personal goals.       Expected Outcomes Short Term: Attend rehab on a regular basis to increase amount of physical activity.;Long Term: Add in home exercise to make exercise part of routine and to increase amount of physical activity.;Long Term: Exercising regularly at least 3-5 days a week.       Increase Strength and Stamina Yes       Intervention Provide advice, education, support and counseling about physical activity/exercise needs.;Develop an individualized exercise prescription for aerobic and resistive training based on initial evaluation findings, risk stratification, comorbidities and participant's personal goals.       Expected Outcomes Short Term: Increase workloads from initial exercise prescription for resistance, speed, and METs.;Short Term: Perform resistance training exercises routinely during rehab and add in resistance training at home;Long Term: Improve cardiorespiratory fitness, muscular endurance and strength as measured by increased METs and functional capacity ( )       Able to understand and use rate of perceived exertion (RPE) scale Yes       Intervention Provide education and explanation on how to use RPE scale       Expected Outcomes Short Term: Able to use RPE daily in rehab to express subjective intensity level;Long Term:  Able to use RPE to guide intensity level when exercising independently       Able to understand and use Dyspnea scale Yes       Intervention Provide education and explanation on how to use Dyspnea scale       Expected Outcomes Short Term: Able to use  Dyspnea scale daily in rehab to express subjective sense of shortness of breath during exertion;Long Term: Able to use Dyspnea scale to guide intensity level when exercising independently       Knowledge and understanding of Target Heart Rate  Range (THRR) Yes       Intervention Provide education and explanation of THRR including how the numbers were predicted and where they are located for reference       Expected Outcomes Short Term: Able to state/look up THRR;Short Term: Able to use daily as guideline for intensity in rehab;Long Term: Able to use THRR to govern intensity when exercising independently       Able to check pulse independently Yes       Intervention Provide education and demonstration on how to check pulse in carotid and radial arteries.;Review the importance of being able to check your own pulse for safety during independent exercise       Expected Outcomes Short Term: Able to explain why pulse checking is important during independent exercise;Long Term: Able to check pulse independently and accurately       Understanding of Exercise Prescription Yes       Intervention Provide education, explanation, and written materials on patient's individual exercise prescription       Expected Outcomes Short Term: Able to explain program exercise prescription;Long Term: Able to explain home exercise prescription to exercise independently                Exercise Goals Re-Evaluation :  Exercise Goals Re-Evaluation     Row Name 10/08/23 0932 10/16/23 1633 10/28/23 1526 11/26/23 1002 11/27/23 1408     Exercise Goal Re-Evaluation   Exercise Goals Review Able to understand and use rate of perceived exertion (RPE) scale;Able to understand and use Dyspnea scale;Knowledge and understanding of Target Heart Rate Range (THRR);Understanding of Exercise Prescription Increase Physical Activity;Increase Strength and Stamina;Understanding of Exercise Prescription Increase Physical Activity;Increase  Strength and Stamina;Understanding of Exercise Prescription Increase Physical Activity;Able to understand and use Dyspnea scale;Understanding of Exercise Prescription;Increase Strength and Stamina;Knowledge and understanding of Target Heart Rate Range (THRR);Able to understand and use rate of perceived exertion (RPE) scale;Able to check pulse independently Increase Physical Activity;Increase Strength and Stamina;Understanding of Exercise Prescription   Comments Reviewed RPE and dyspnea scale, THR and program prescription with pt today.  Pt voiced understanding and was given a copy of goals to take home. Elo is off to a good start in the program. He was able to maintain his prescribed speed on the treadmill, as well as his prescribed level on the T4 nustep. We will continue to monitor his progress in the program. Atley is doing well in the program. He was recently able to increase his level on the XR from level 3 to 4. He was also able to increase to level 5 on the recumbent bike. He has maintained his speed on the treadmill, and his level on the T4 nustep. We will continue to monitor his progress in the program. Reviewed home exercise with pt today.  Pt plans to go to the [facility] taking exercise classes and doing independent exercise with the equipment for exercise. He plans on adding 2 additional days of exercise. Reviewed THR, pulse, RPE, sign and symptoms, pulse oximetery and when to call 911 or MD.  Also discussed weather considerations and indoor options.  Pt voiced understanding. Zyiere continues to do well in the program. He has stayed consistent with his workloads on the treadmill and seated machines. He did improve to level 2 on the elliptical while maintaining a speed of 3.1 mph. He also continues to use 7 lb hand weights for resistance training. We will continue to monitor his progress in the program.   Expected Outcomes Short:  Use RPE daily to regulate intensity. Long: Follow program  prescription in THR. Short: Continue to follow current exercise prescription. Long: Continue exercise to improve strength and stamina. Short: Continue to follow current exercise prescription. Long: Continue exercise to improve strength and stamina. Short: Add 2 additional days of exercise at home. Long: Continue to exercise independenty at home Short: Progressively increase treadmill workload. Long: Continue exercise to improve strength and stamina.    Row Name 12/08/23 1558 12/22/23 0925           Exercise Goal Re-Evaluation   Exercise Goals Review Increase Physical Activity;Increase Strength and Stamina;Understanding of Exercise Prescription Increase Physical Activity;Increase Strength and Stamina;Understanding of Exercise Prescription      Comments Sterlin continues to do well in rehab. He has maintained his workloads on the treadmill, elliptical, XR, and T4 and T6 nusteps. On the treadmill he did a speed of 2.3mph. On the elliptical he did level 2 with a speed of 3.1. We will continue to monitor his progress in the program. Trey is graduating today from cardiac rehab and plans to continue to exercise in the [facility] upon graduation from to the program.      Expected Outcomes Short: Progressively increase treadmill speed and elliptical workloads. Long: Continue exercise to improve strength and stamina. Short: graduate from cardiac rehab. Long: maintain independent exercise routine to help control cardiac risk factors.               Discharge Exercise Prescription (Final Exercise Prescription Changes):  Exercise Prescription Changes - 12/08/23 1500       Response to Exercise   Blood Pressure (Admit) 128/66    Blood Pressure (Exit) 136/72    Heart Rate (Admit) 74 bpm    Heart Rate (Exercise) 130 bpm    Heart Rate (Exit) 75 bpm    Rating of Perceived Exertion (Exercise) 15    Symptoms none    Duration Continue with 30 min of aerobic exercise without signs/symptoms of physical  distress.    Intensity THRR unchanged      Progression   Progression Continue to progress workloads to maintain intensity without signs/symptoms of physical distress.    Average METs 3.13      Resistance Training   Training Prescription Yes    Weight 7 lb    Reps 10-15      Interval Training   Interval Training No      Treadmill   MPH 2.6    Grade 0    Minutes 15    METs 2.99      NuStep   Level 4   T6 and T4   Minutes 15    METs 2.3      Elliptical   Level 2    Speed 3.1    Minutes 15      REL-XR   Level 4    Minutes 15      Home Exercise Plan   Plans to continue exercise at [employer] (comment)   [employer] (classes and independent exercise)   Frequency Add 2 additional days to program exercise sessions.    Initial Home Exercises Provided 11/26/23      Oxygen   Maintain Oxygen Saturation 88% or higher             Nutrition:  Target Goals: Understanding of nutrition guidelines, daily intake of sodium 1500mg , cholesterol 200mg , calories 30% from fat and 7% or less from saturated fats, daily to have 5 or more servings of fruits and  vegetables.  Education: All About Nutrition: -Group instruction provided by verbal, written material, interactive activities, discussions, models, and posters to present general guidelines for heart healthy nutrition including fat, fiber, MyPlate, the role of sodium in heart healthy nutrition, utilization of the nutrition label, and utilization of this knowledge for meal planning. Follow up email sent as well. Written material given at graduation. Flowsheet Row Cardiac Rehab from 12/10/2023 in [facility] Cardiac and Pulmonary Rehab  Education need identified 10/08/23  Date 11/19/23  Educator JG  Instruction Review Code 1- Verbalizes Understanding       Biometrics:  Pre Biometrics - 10/02/23 1621       Pre Biometrics   Height 5' 6.4" (1.687 m)    Weight 144 lb 8 oz (65.5 kg)    Waist Circumference 35 inches    Hip  Circumference 36 inches    Waist to Hip Ratio 0.97 %    BMI (Calculated) 23.03    Single Leg Stand 30 seconds             Post Biometrics - 12/12/23 0955        Post  Biometrics   Height 5' 6.4" (1.687 m)    Weight 151 lb 8 oz (68.7 kg)    Waist Circumference 34 inches    Hip Circumference 36 inches    Waist to Hip Ratio 0.94 %    BMI (Calculated) 24.15    Single Leg Stand 30 seconds             Nutrition Therapy Plan and Nutrition Goals:  Nutrition Therapy & Goals - 10/02/23 1622       Nutrition Therapy   RD appointment deferred Yes      Personal Nutrition Goals   Nutrition Goal RD appointment deferred at this time.      Intervention Plan   Intervention Prescribe, educate and counsel regarding individualized specific dietary modifications aiming towards targeted core components such as weight, hypertension, lipid management, diabetes, heart failure and other comorbidities.;Nutrition handout(s) given to patient.    Expected Outcomes Short Term Goal: Understand basic principles of dietary content, such as calories, fat, sodium, cholesterol and nutrients.;Short Term Goal: A plan has been developed with personal nutrition goals set during dietitian appointment.;Long Term Goal: Adherence to prescribed nutrition plan.             Nutrition Assessments:  MEDIFICTS Score Key: >=70 Need to make dietary changes  40-70 Heart Healthy Diet <= 40 Therapeutic Level Cholesterol Diet  Flowsheet Row Cardiac Rehab from 12/17/2023 in [facility] Cardiac and Pulmonary Rehab  Picture Your Plate Total Score on Admission 43  Picture Your Plate Total Score on Discharge 59      Picture Your Plate Scores: <40 Unhealthy dietary pattern with much room for improvement. 41-50 Dietary pattern unlikely to meet recommendations for good health and room for improvement. 51-60 More healthful dietary pattern, with some room for improvement.  >60 Healthy dietary pattern, although there may be some  specific behaviors that could be improved.    Nutrition Goals Re-Evaluation:  Nutrition Goals Re-Evaluation     Row Name 10/24/23 [phone] 11/26/23 0951 12/22/23 0927         Goals   Nutrition Goal RD appointment deferred at this time. RD appointment deferred at this time. RD appointment deferred at this time.              Nutrition Goals Discharge (Final Nutrition Goals Re-Evaluation):  Nutrition Goals Re-Evaluation - 12/22/23 9147  Goals   Nutrition Goal RD appointment deferred at this time.             Psychosocial: Target Goals: Acknowledge presence or absence of significant depression and/or stress, maximize coping skills, provide positive support system. Participant is able to verbalize types and ability to use techniques and skills needed for reducing stress and depression.   Education: Stress, Anxiety, and Depression - Group verbal and visual presentation to define topics covered.  Reviews how body is impacted by stress, anxiety, and depression.  Also discusses healthy ways to reduce stress and to treat/manage anxiety and depression.  Written material given at graduation. Flowsheet Row Cardiac Rehab from 12/10/2023 in [facility] Cardiac and Pulmonary Rehab  Education need identified 10/08/23       Education: Sleep Hygiene -Provides group verbal and written instruction about how sleep can affect your health.  Define sleep hygiene, discuss sleep cycles and impact of sleep habits. Review good sleep hygiene tips.    Initial Review & Psychosocial Screening:  Initial Psych Review & Screening - 10/01/23 1344       Initial Review   Current issues with Current Stress Concerns    Source of Stress Concerns Chronic Illness      Family Dynamics   Good Support System? Yes    Comments He can look to his wife for support. He has been through the program before and has had some issues with his lifestyle. He is ready to get his health back on track.      Barriers    Psychosocial barriers to participate in program The patient should benefit from training in stress management and relaxation.      Screening Interventions   Interventions Program counselor consult;To provide support and resources with identified psychosocial needs;Encouraged to exercise;Provide feedback about the scores to participant    Expected Outcomes Short Term goal: Utilizing psychosocial counselor, staff and physician to assist with identification of specific Stressors or current issues interfering with healing process. Setting desired goal for each stressor or current issue identified.;Long Term Goal: Stressors or current issues are controlled or eliminated.;Short Term goal: Identification and review with participant of any Quality of Life or Depression concerns found by scoring the questionnaire.;Long Term goal: The participant improves quality of Life and PHQ9 Scores as seen by post scores and/or verbalization of changes             Quality of Life Scores:   Quality of Life - 12/17/23 1126       Quality of Life Scores   Health/Function Pre --    Health/Function Post 26.27 %    Socioeconomic Post 17.94 %    Psych/Spiritual Post 30 %    Family Post 27.6 %    GLOBAL Post 25.3 %            Scores of 19 and below usually indicate a poorer quality of life in these areas.  A difference of  2-3 points is a clinically meaningful difference.  A difference of 2-3 points in the total score of the Quality of Life Index has been associated with significant improvement in overall quality of life, self-image, physical symptoms, and general health in studies assessing change in quality of life.  PHQ-9: Review Flowsheet       12/17/2023 10/02/2023 08/06/2018 05/28/2018  Depression screen PHQ 2/9  Decreased Interest 0 0 0 2  Down, Depressed, Hopeless 0 0 0 0  PHQ - 2 Score 0 0 0 2  Altered sleeping 1  0 0 0  Tired, decreased energy 0 0 0 0  Change in appetite 0 0 0 1  Feeling bad or  failure about yourself  0 1 0 0  Trouble concentrating 0 0 0 0  Moving slowly or fidgety/restless 0 0 0 0  Suicidal thoughts 0 0 0 0  PHQ-9 Score 1 1 0 3  Difficult doing work/chores Not difficult at all Not difficult at all Not difficult at all Not difficult at all   Interpretation of Total Score  Total Score Depression Severity:  1-4 = Minimal depression, 5-9 = Mild depression, 10-14 = Moderate depression, 15-19 = Moderately severe depression, 20-27 = Severe depression   Psychosocial Evaluation and Intervention:  Psychosocial Evaluation - 10/01/23 1348       Psychosocial Evaluation & Interventions   Interventions Relaxation education;Stress management education;Encouraged to exercise with the program and follow exercise prescription    Comments He can look to his wife for support. He has been through the program before and has had some issues with his lifestyle. He is ready to get his health back on track.    Expected Outcomes Short: Start [facility] to help with mood. Long: Maintain a healthy mental state    Continue Psychosocial Services  Follow up required by staff             Psychosocial Re-Evaluation:  Psychosocial Re-Evaluation     Row Name 10/24/23 [phone] 11/26/23 0950 12/22/23 5284         Psychosocial Re-Evaluation   Current issues with None Identified None Identified None Identified     Comments Patient reports no issues with their current mental states, sleep, stress, depression or anxiety. Will follow up with patient in a few weeks for any changes. Patient reports no issues with their current mental states, sleep, stress, depression or anxiety. Will follow up with patient in a few weeks for any changes. No changes in sleep, stress, or mental health reported. He feels he is stable in these areas.     Expected Outcomes Short: Continue to exercise regularly to support mental health and notify staff of any changes. Long: maintain mental health and well being through  teaching of rehab or prescribed medications independently. Short: Continue to exercise regularly to support mental health and notify staff of any changes. Long: maintain mental health and well being through teaching of rehab or prescribed medications independently. Short: graduate from cardiac rehab. Long: maintian good mental health habits.     Interventions Encouraged to attend Cardiac Rehabilitation for the exercise Encouraged to attend Cardiac Rehabilitation for the exercise --     Continue Psychosocial Services  Follow up required by staff Follow up required by staff No Follow up required  graduating today              Psychosocial Discharge (Final Psychosocial Re-Evaluation):  Psychosocial Re-Evaluation - 12/22/23 0927       Psychosocial Re-Evaluation   Current issues with None Identified    Comments No changes in sleep, stress, or mental health reported. He feels he is stable in these areas.    Expected Outcomes Short: graduate from cardiac rehab. Long: maintian good mental health habits.    Continue Psychosocial Services  No Follow up required   graduating today            Vocational Rehabilitation: Provide vocational rehab assistance to qualifying candidates.   Vocational Rehab Evaluation & Intervention:  Vocational Rehab - 12/17/23 1126       Discharge  Vocational Rehab   Discharge Vocational Rehabilitation no requests for VR             Education: Education Goals: Education classes will be provided on a variety of topics geared toward better understanding of heart health and risk factor modification. Participant will state understanding/return demonstration of topics presented as noted by education test scores.  Learning Barriers/Preferences:  Learning Barriers/Preferences - 10/01/23 1343       Learning Barriers/Preferences   Learning Barriers None    Learning Preferences Computer/Internet;Verbal Instruction;Group Instruction;Skilled Demonstration;Video              General Cardiac Education Topics:  AED/CPR: - Group verbal and written instruction with the use of models to demonstrate the basic use of the AED with the basic ABC's of resuscitation. Flowsheet Row Cardiac Rehab from 08/05/2018 in [facility] Cardiac and Pulmonary Rehab  Date 07/13/18 (P)   Educator CE (P)   Instruction Review Code 1- Verbalizes Understanding (P)        Anatomy and Cardiac Procedures: - Group verbal and visual presentation and models provide information about basic cardiac anatomy and function. Reviews the testing methods done to diagnose heart disease and the outcomes of the test results. Describes the treatment choices: Medical Management, Angioplasty, or Coronary Bypass Surgery for treating various heart conditions including Myocardial Infarction, Angina, Valve Disease, and Cardiac Arrhythmias.  Written material given at graduation. Flowsheet Row Cardiac Rehab from 12/10/2023 in [facility] Cardiac and Pulmonary Rehab  Education need identified 10/08/23       Medication Safety: - Group verbal and visual instruction to review commonly prescribed medications for heart and lung disease. Reviews the medication, class of the drug, and side effects. Includes the steps to properly store meds and maintain the prescription regimen.  Written material given at graduation. Flowsheet Row Cardiac Rehab from 12/10/2023 in [facility] Cardiac and Pulmonary Rehab  Date 12/03/23  Educator SB  Instruction Review Code 1- Verbalizes Understanding       Intimacy: - Group verbal instruction through game format to discuss how heart and lung disease can affect sexual intimacy. Written material given at graduation.. Flowsheet Row Cardiac Rehab from 12/10/2023 in [facility] Cardiac and Pulmonary Rehab  Education need identified 10/08/23       Know Your Numbers and Heart Failure: - Group verbal and visual instruction to discuss disease risk factors for cardiac and pulmonary disease and treatment  options.  Reviews associated critical values for Overweight/Obesity, Hypertension, Cholesterol, and Diabetes.  Discusses basics of heart failure: signs/symptoms and treatments.  Introduces Heart Failure Zone chart for action plan for heart failure.  Written material given at graduation. Flowsheet Row Cardiac Rehab from 12/10/2023 in [facility] Cardiac and Pulmonary Rehab  Education need identified 10/08/23  Date 12/10/23  Educator MC  Instruction Review Code 1- Verbalizes Understanding       Infection Prevention: - Provides verbal and written material to individual with discussion of infection control including proper hand washing and proper equipment cleaning during exercise session. Flowsheet Row Cardiac Rehab from 10/08/2023 in [facility] Cardiac and Pulmonary Rehab  Date 10/02/23  Educator MB  Instruction Review Code 1- Verbalizes Understanding       Falls Prevention: - Provides verbal and written material to individual with discussion of falls prevention and safety. Flowsheet Row Cardiac Rehab from 10/08/2023 in [facility] Cardiac and Pulmonary Rehab  Date 10/02/23  Educator MB  Instruction Review Code 1- Verbalizes Understanding       Other: -Provides group and verbal instruction on various  topics (see comments)   Knowledge Questionnaire Score:  Knowledge Questionnaire Score - 12/17/23 1125       Knowledge Questionnaire Score   Pre Score 18/26    Post Score 23/26             Core Components/Risk Factors/Patient Goals at Admission:  Personal Goals and Risk Factors at Admission - 10/02/23 1623       Core Components/Risk Factors/Patient Goals on Admission    Weight Management Yes;Weight Maintenance    Intervention Weight Management: Develop a combined nutrition and exercise program designed to reach desired caloric intake, while maintaining appropriate intake of nutrient and fiber, sodium and fats, and appropriate energy expenditure required for the weight goal.;Weight  Management: Provide education and appropriate resources to help participant work on and attain dietary goals.;Weight Management/Obesity: Establish reasonable short term and long term weight goals.    Admit Weight 144 lb 8 oz (65.5 kg)    Goal Weight: Short Term 144 lb 8 oz (65.5 kg)    Goal Weight: Long Term 144 lb 8 oz (65.5 kg)    Expected Outcomes Long Term: Adherence to nutrition and physical activity/exercise program aimed toward attainment of established weight goal;Weight Maintenance: Understanding of the daily nutrition guidelines, which includes 25-35% calories from fat, 7% or less cal from saturated fats, less than 200mg  cholesterol, less than 1.5gm of sodium, & 5 or more servings of fruits and vegetables daily;Short Term: Continue to assess and modify interventions until short term weight is achieved;Understanding recommendations for meals to include 15-35% energy as protein, 25-35% energy from fat, 35-60% energy from carbohydrates, less than 200mg  of dietary cholesterol, 20-35 gm of total fiber daily;Understanding of distribution of calorie intake throughout the day with the consumption of 4-5 meals/snacks    Hypertension Yes    Intervention Provide education on lifestyle modifcations including regular physical activity/exercise, weight management, moderate sodium restriction and increased consumption of fresh fruit, vegetables, and low fat dairy, alcohol moderation, and smoking cessation.;Monitor prescription use compliance.    Expected Outcomes Long Term: Maintenance of blood pressure at goal levels.;Short Term: Continued assessment and intervention until BP is < 140/62mm HG in hypertensive participants. < 130/12mm HG in hypertensive participants with diabetes, heart failure or chronic kidney disease.    Lipids Yes    Intervention Provide education and support for participant on nutrition & aerobic/resistive exercise along with prescribed medications to achieve LDL 70mg , HDL >40mg .     Expected Outcomes Short Term: Participant states understanding of desired cholesterol values and is compliant with medications prescribed. Participant is following exercise prescription and nutrition guidelines.;Long Term: Cholesterol controlled with medications as prescribed, with individualized exercise RX and with personalized nutrition plan. Value goals: LDL < 70mg , HDL > 40 mg.             Education:Diabetes - Individual verbal and written instruction to review signs/symptoms of diabetes, desired ranges of glucose level fasting, after meals and with exercise. Acknowledge that pre and post exercise glucose checks will be done for 3 sessions at entry of program.   Core Components/Risk Factors/Patient Goals Review:   Goals and Risk Factor Review     Row Name 10/24/23 0925 11/26/23 0951 12/22/23 0929         Core Components/Risk Factors/Patient Goals Review   Personal Goals Review Weight Management/Obesity;Hypertension Weight Management/Obesity;Hypertension Hypertension;Lipids     Review Hamp has a blood pressure machine at home and does not check it. Informed him to check it at least once a day to  make sure it is correlating with his rehab readings. He states he will start doing so. Alexandria is wanting to maintain his weight. He has no questions on his medications at this time. His blood pressure has been good and has been checking his blood pressure at home. Emmanual is graduating from cardiac rehab today. He plans to continue to take all his medications and follow up with his doctor with any concerns regarding blood pressure or cholesterol. He has a BP cuff at home and was encouraged to contine to monitor BP independently. He is exercising [facility] the [facility] and stated that the staff can also check his BP there.     Expected Outcomes Short: start checking blood pressures at home. Long: maintain blood pressure readings independently. Short: continue to attent Rehab. Long: maintain weight and  blood pressure independently. Short: graduate from cardiac rehab. Long: continue to control cardiac risk factors with medical team.              Core Components/Risk Factors/Patient Goals at Discharge (Final Review):   Goals and Risk Factor Review - 12/22/23 0929       Core Components/Risk Factors/Patient Goals Review   Personal Goals Review Hypertension;Lipids    Review Jabriel is graduating from cardiac rehab today. He plans to continue to take all his medications and follow up with his doctor with any concerns regarding blood pressure or cholesterol. He has a BP cuff at home and was encouraged to contine to monitor BP independently. He is exercising at the [facility] and stated that the staff can also check his BP there.    Expected Outcomes Short: graduate from cardiac rehab. Long: continue to control cardiac risk factors with medical team.             ITP Comments:  ITP Comments     Row Name 10/01/23 1342 10/02/23 1614 10/08/23 0931 10/15/23 1122 11/12/23 1440   ITP Comments Virtual Visit completed. Patient informed on EP and RD appointment and 6 Minute walk test. Patient also informed of patient health questionnaires on My Chart. Patient Verbalizes understanding. Visit diagnosis can be found in [facility] 08/31/2023. Completed and gym orientation. Initial ITP created and sent for review to Dr. Bethann Punches, Medical Director. First full day of exercise!  Patient was oriented to gym and equipment including functions, settings, policies, and procedures.  Patient's individual exercise prescription and treatment plan were reviewed.  All starting workloads were established based on the results of the 6 minute walk test done at initial orientation visit.  The plan for exercise progression was also introduced and progression will be customized based on patient's performance and goals. 30 Day review completed. Medical Director ITP review done, changes made as directed, and signed approval by  Medical Director.    new to program 30 Day review completed. Medical Director ITP review done, changes made as directed, and signed approval by Medical Director.    new to program    Row Name 12/10/23 1145 12/17/23 0958 12/17/23 1022 12/22/23 0944     ITP Comments 30 Day review completed. Medical Director ITP review done, changes made as directed, and signed approval by Medical Director. Raygen graduated today from  rehab with 36 sessions completed.  Details of the patient's exercise prescription and what He needs to do in order to continue the prescription and progress were discussed with patient.  Patient was given a copy of prescription and goals.  Patient verbalized understanding. Sakib plans to continue to exercise by joininf  the [facility] community gym. error   not Discharge yet Marvon graduated today from  rehab with 36 sessions completed.  Details of the patient's exercise prescription and what He needs to do in order to continue the prescription and progress were discussed with patient.  Patient was given a copy of prescription and goals.  Patient verbalized understanding. Topher plans to continue to exercise by exercising at the [employer] at [facility].             Comments: Discharge ITP

## 2023-12-22 NOTE — Progress Notes (Signed)
 Daily Session Note  Patient Details  Name: Mark Mack MRN: [id number] Date of Birth: Mar 04, 1956 Referring Provider:   Flowsheet Row Cardiac Rehab from 10/02/2023 in [facility] Cardiac and Pulmonary Rehab  Referring Provider Dorothyann Peng, MD       Encounter Date: 12/22/2023  Check In:  Session Check In - 12/22/23 0942       Check-In   Supervising physician immediately available to respond to emergencies See telemetry face sheet for immediately available ER MD    Location [facility]-Cardiac & Pulmonary Rehab    Staff Present Cora Collum, RN, BSN, CCRP;Kelly Hayes BS, ACSM CEP, Exercise Physiologist;Margaret Best, MS, Exercise Physiologist;Maxon Conetta BS, Exercise Physiologist    Virtual Visit No    Medication changes reported     No    Fall or balance concerns reported    No    Warm-up and Cool-down Performed on first and last piece of equipment    Resistance Training Performed Yes    VAD Patient? No    PAD/SET Patient? No      Pain Assessment   Currently in Pain? No/denies                Social History   Tobacco Use  Smoking Status Former   Current packs/day: 0.00   Types: Cigarettes   Quit date: 03/23/2018   Years since quitting: 5.7  Smokeless Tobacco Never    Goals Met:  Independence with exercise equipment Exercise tolerated well Personal goals reviewed No report of concerns or symptoms today  Goals Unmet:  Not Applicable  Comments:  Mark Mack graduated today from  rehab with 36 sessions completed.  Details of the patient's exercise prescription and what He needs to do in order to continue the prescription and progress were discussed with patient.  Patient was given a copy of prescription and goals.  Patient verbalized understanding. [name] plans to continue to exercise by exercising at the [employer] at [facility].   Dr. Bethann Punches is Medical Director for [facility] Cardiac Rehabilitation.  Dr. Vida Rigger is Medical Director for [facility]  Pulmonary Rehabilitation.

## 2023-12-22 NOTE — Progress Notes (Signed)
 Discharge Note for  Mark Mack     [birth year]        Mark Mack graduated today from  rehab with 36 sessions completed.  Details of the patient's exercise prescription and what He needs to do in order to continue the prescription and progress were discussed with patient.  Patient was given a copy of prescription and goals.  Patient verbalized understanding. Mark Mack plans to continue to exercise by exercising at the [employer] at [facility].    6 Minute Walk     Row Name 10/02/23 1614 12/12/23 0952       6 Minute Walk   Phase Initial Discharge    Distance 1615 feet 1520 feet    Distance % Change -- -5.9 %    Distance Feet Change -- -95 ft    Walk Time 6 minutes 6 minutes    # of Rest Breaks 0 0    MPH 3.1 2.88    METS 4.51 3.92    RPE 13 13    Perceived Dyspnea  0 0    VO2 Peak 15.77 13.71    Symptoms No No    Resting HR 67 bpm 64 bpm    Resting BP 166/86 126/64    Resting Oxygen Saturation  97 % 98 %    Exercise Oxygen Saturation  during 6 min walk 100 % 94 %    Max Ex. HR 115 bpm 102 bpm    Max Ex. BP 184/72 160/72    2 Minute Post BP 158/84 --

## 2024-06-06 ENCOUNTER — Emergency Department: Admission: EM | Admit: 2024-06-06 | Discharge: 2024-06-07 | Disposition: A | Discharge status: Home / Self Care

## 2024-06-06 DIAGNOSIS — F17200 Nicotine dependence, unspecified, uncomplicated: Secondary | ICD-10-CM | POA: Insufficient documentation

## 2024-06-06 DIAGNOSIS — I2510 Atherosclerotic heart disease of native coronary artery without angina pectoris: Secondary | ICD-10-CM | POA: Insufficient documentation

## 2024-06-06 DIAGNOSIS — Z955 Presence of coronary angioplasty implant and graft: Secondary | ICD-10-CM | POA: Insufficient documentation

## 2024-06-06 DIAGNOSIS — R55 Syncope and collapse: Secondary | ICD-10-CM | POA: Diagnosis present

## 2024-06-06 DIAGNOSIS — N189 Chronic kidney disease, unspecified: Secondary | ICD-10-CM | POA: Insufficient documentation

## 2024-06-06 DIAGNOSIS — N179 Acute kidney failure, unspecified: Secondary | ICD-10-CM | POA: Diagnosis not present

## 2024-06-06 DIAGNOSIS — I129 Hypertensive chronic kidney disease with stage 1 through stage 4 chronic kidney disease, or unspecified chronic kidney disease: Secondary | ICD-10-CM | POA: Insufficient documentation

## 2024-06-06 DIAGNOSIS — E876 Hypokalemia: Secondary | ICD-10-CM | POA: Insufficient documentation

## 2024-06-06 LAB — CBG MONITORING, ED: Glucose-Capillary: 88 mg/dL (ref 70–99)

## 2024-06-06 LAB — PROTIME-INR
INR: 1.2 (ref 0.8–1.2)
Prothrombin Time: 15.4 s — ABNORMAL HIGH (ref 11.4–15.2)

## 2024-06-06 LAB — COMPREHENSIVE METABOLIC PANEL WITH GFR
ALT: 29 U/L (ref 0–44)
AST: 32 U/L (ref 15–41)
Albumin: 3.7 g/dL (ref 3.5–5.0)
Alkaline Phosphatase: 125 U/L (ref 38–126)
Anion gap: 11 (ref 5–15)
BUN: 18 mg/dL (ref 8–23)
CO2: 20 mmol/L — ABNORMAL LOW (ref 22–32)
Calcium: 8.7 mg/dL — ABNORMAL LOW (ref 8.9–10.3)
Chloride: 111 mmol/L (ref 98–111)
Creatinine, Ser: 1.67 mg/dL — ABNORMAL HIGH (ref 0.61–1.24)
GFR, Estimated: 45 mL/min — ABNORMAL LOW (ref >60)
Glucose, Bld: 98 mg/dL (ref 70–99)
Potassium: 3.3 mmol/L — ABNORMAL LOW (ref 3.5–5.1)
Sodium: 142 mmol/L (ref 135–145)
Total Bilirubin: 0.7 mg/dL (ref 0.0–1.2)
Total Protein: 7 g/dL (ref 6.5–8.1)

## 2024-06-06 LAB — CBC WITH DIFFERENTIAL/PLATELET
Abs Immature Granulocytes: 0.03 K/uL (ref 0.00–0.07)
Basophils Absolute: 0.1 K/uL (ref 0.0–0.1)
Basophils Relative: 1 %
Eosinophils Absolute: 0.1 K/uL (ref 0.0–0.5)
Eosinophils Relative: 1 %
HCT: 41.3 % (ref 39.0–52.0)
Hemoglobin: 13.1 g/dL (ref 13.0–17.0)
Immature Granulocytes: 0 %
Lymphocytes Relative: 36 %
Lymphs Abs: 3 K/uL (ref 0.7–4.0)
MCH: 27.1 pg (ref 26.0–34.0)
MCHC: 31.7 g/dL (ref 30.0–36.0)
MCV: 85.5 fL (ref 80.0–100.0)
Monocytes Absolute: 0.9 K/uL (ref 0.1–1.0)
Monocytes Relative: 10 %
Neutro Abs: 4.4 K/uL (ref 1.7–7.7)
Neutrophils Relative %: 52 %
Platelets: 232 K/uL (ref 150–400)
RBC: 4.83 MIL/uL (ref 4.22–5.81)
RDW: 14.6 % (ref 11.5–15.5)
WBC: 8.4 K/uL (ref 4.0–10.5)
nRBC: 0 % (ref 0.0–0.2)

## 2024-06-06 LAB — TROPONIN I (HIGH SENSITIVITY): Troponin I (High Sensitivity): 10 ng/L (ref <18)

## 2024-06-06 MED ORDER — SODIUM CHLORIDE 0.9 % IV BOLUS
500.0000 mL | Freq: Once | INTRAVENOUS | Status: AC
  Administered 2024-06-06: 500 mL via INTRAVENOUS

## 2024-06-06 MED ORDER — POTASSIUM CHLORIDE 20 MEQ PO PACK
40.0000 meq | PACK | Freq: Once | ORAL | Status: AC
  Administered 2024-06-06: 40 meq via ORAL
  Filled 2024-06-06: qty 2

## 2024-06-06 NOTE — Discharge Instructions (Signed)
 Evaluation in the emergency department was overall reassuring, and we did see evidence of dehydration here.  Continue to drink plenty of fluids at home and follow-up with your primary care provider for reevaluation.  Return to the emergency department with any new or worsening symptoms.

## 2024-06-06 NOTE — ED Triage Notes (Signed)
 Chief Complaint  Patient presents with   Weakness   Loss of Consciousness   Pt arrives to ED after being at home and working outside throughout the day. Pts family reports he had a syncopal episode. Per EMS pt was conscious upon arrival. Pt is A&Ox4. EMS reports pt was diaphoretic and hypotensive upon their arrival. Pt denies any pain currently. Per EMS pt did have a cardiac arrest In November of 2024. Pt reports that he did have a few alcohol beverages today.   BP 94/61   Pulse (!) 58   Temp 97.8 F (36.6 C) (Oral)   Resp 14   Ht 5' 6 (1.676 m)   Wt 71.2 kg   SpO2 96%   BMI 25.34 kg/m   Past Medical History:  Diagnosis Date   CAD (coronary artery disease)    Hypertension    OA (osteoarthritis) of hip    bilateral hips   Tobacco use

## 2024-06-06 NOTE — ED Provider Notes (Signed)
 [facility] Provider Note    Event Date/Time   First MD Initiated Contact with Patient 06/06/24 2100     (approximate)   History   Weakness and Loss of Consciousness  Chief Complaint  Patient presents with   Weakness   Loss of Consciousness   Pt arrives to ED after being at home and working outside throughout the day. Pts family reports he had a syncopal episode. Per EMS pt was conscious upon arrival. Pt is A&Ox4. EMS reports pt was diaphoretic and hypotensive upon their arrival. Pt denies any pain currently. Per EMS pt did have a cardiac arrest In November of 2024. Pt reports that he did have a few alcohol beverages today.   BP 94/61   Pulse (!) 58   Temp 97.8 F (36.6 C) (Oral)   Resp 14   Ht 5' 6 (1.676 m)   Wt 71.2 kg   SpO2 96%   BMI 25.34 kg/m   Past Medical History:  Diagnosis Date   CAD (coronary artery disease)    Hypertension    OA (osteoarthritis) of hip    bilateral hips   Tobacco use       HPI Mark Mack is a 68 y.o. male PMH CAD s/p CABG x 4, CKD, polysubstance use presents for evaluation of a near syncopal episode - Patient was having a cookout today, patient was cooking on the grill, had been out in the heat for a few hours.  Was reportedly in the midst of syncopized with family member about 45 minutes ago when he became minimally responsive though not fully unconscious.  Seemed diaphoretic.  Family laid him down and put ice packs around his head and he is slowly regained interactivity.  No convulsions. - Patient notes that he had been feeling lightheaded for at least several minutes prior to this episode  Per chart review, last admitted in November 2024 after presenting cardiac arrest.  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      Physical Exam   Triage Vital Signs: ED Triage Vitals  Encounter Vitals Group     BP 06/06/24 2102 94/61     Girls Systolic BP  Percentile --      Girls Diastolic BP Percentile --      Boys Systolic BP Percentile --      Boys Diastolic BP Percentile --      Pulse Rate 06/06/24 2102 (!) 58     Resp 06/06/24 2102 14     Temp 06/06/24 2102 97.8 F (36.6 C)     Temp Source 06/06/24 2102 Oral     SpO2 06/06/24 2102 96 %     Weight 06/06/24 2103 157 lb (71.2 kg)     Height 06/06/24 2103 5' 6 (1.676 m)     Head Circumference --      Peak Flow --      Pain Score --      Pain Loc --      Pain Education --      Exclude from Growth Chart --     Most recent vital signs: Vitals:   06/06/24 2102  BP: 94/61  Pulse: (!) 58  Resp: 14  Temp: 97.8 F (36.6 C)  SpO2: 96%     General: Awake, no distress.  CV:  Good peripheral perfusion. RRR, RP 2+ Resp:  Normal effort. CTAB Abd:  No distention. Nontender to deep palpation throughout Other:  ED Results / Procedures / Treatments   Labs (all labs ordered are listed, but only abnormal results are displayed) Labs Reviewed  COMPREHENSIVE METABOLIC PANEL WITH GFR - Abnormal; Notable for the following components:      Result Value   Potassium 3.3 (*)    CO2 20 (*)    Creatinine, Ser 1.67 (*)    Calcium  8.7 (*)    GFR, Estimated 45 (*)    All other components within normal limits  PROTIME-INR - Abnormal; Notable for the following components:   Prothrombin Time 15.4 (*)    All other components within normal limits  CBC WITH DIFFERENTIAL/PLATELET  CBG MONITORING, ED  TROPONIN I (HIGH SENSITIVITY)  TROPONIN I (HIGH SENSITIVITY)     EKG  See ED course below   RADIOLOGY N/a    PROCEDURES:  Critical Care performed: No  Procedures   MEDICATIONS ORDERED IN ED: Medications  sodium chloride  0.9 % bolus 500 mL (500 mLs Intravenous New Bag/Given 06/06/24 2218)  potassium chloride  (KLOR-CON ) packet 40 mEq (40 mEq Oral Given 06/06/24 2219)     IMPRESSION / MDM / ASSESSMENT AND PLAN / ED COURSE  I reviewed the triage vital signs and the nursing  notes.                              DDX/MDM/AP: Differential diagnosis includes, but is not limited to, dehydration, vasovagal episode, consider transient arrhythmia, ACS, do not clinically suspect PE at this time, no findings to suggest stroke.  Plan: - Labs - EKG - Cardiac monitor - Reassess  Patient's presentation is most consistent with acute presentation with potential threat to life or bodily function.  The patient is on the cardiac monitor to evaluate for evidence of arrhythmia and/or significant heart rate changes.  ED course below.  Workup with mild AKI as well as mildly low bicarb and mild hypokalemia, all suggestive of likely dehydration.  Patient feeling much better after bolus of 500 cc of fluid, also received 500 cc with EMS.  Potassium repleted.  Initial troponin normal.  EKG nonischemic.  Overall suspect likely dehydration and possible vasovagal episode of patient being a heat likely contributed to presentation.  Did nonetheless offer admission given his significant cardiac history for telemetry monitoring and further evaluation which patient declined.  Signed out to oncoming ED provider pending repeat troponin.  If stable, patient is stable for discharge home and outpatient follow-up from our perspective.  If rising, consider admission.  Clinical Course as of 06/06/24 2353  [place] Jun 06, 2024  2107 Ecg = sinus rhythm, rate 59, no gross ST elevation or depression, no significant repolarization abnormality isolated T wave inversion in lead V3 is nonspecific, normal axis, normal intervals.  No clear evidence of ischemia nor arrhythmia on my interpretation. [MM]  2119 Cbc wnl [MM]  2152 Troponin normal  CMP with AKI, hypokalemia, will give IV fluid and replete p.o. potassium [MM]  2316 Patient reevaluated, feeling very well with IV fluids, drinking his oral potassium.  No recurrence of symptoms here.  Discussed overall presentation, cannot entirely rule out transient  arrhythmia though patient reiterates he was out in the heat all day and did not drink any water  was standing over a hot grill.  Felt lightheaded and did not lose consciousness.  Overall suspect some element of dehydration as is evidenced in his labs as well as possible vasovagal episode.  Did offer admission nonetheless given his notable  cardiac history for monitoring which patient declines negative and if his repeat troponin is stable he would like to be discharged home and follow-up with his primary care provider.  I do believe this is very reasonable.  Signed out to oncoming ED provider pending repeat troponin.  If stable, stable for discharge home from my perspective. [MM]    Clinical Course User Index [MM] Clarine Ozell LABOR, MD     FINAL CLINICAL IMPRESSION(S) / ED DIAGNOSES   Final diagnoses:  Near syncope     Rx / DC Orders   ED Discharge Orders     None        Note:  This document was prepared using Dragon voice recognition software and may include unintentional dictation errors.   Clarine Ozell LABOR, MD 06/06/24 [phone]

## 2024-06-07 LAB — TROPONIN I (HIGH SENSITIVITY): Troponin I (High Sensitivity): 8 ng/L (ref <18)

## 2024-06-07 NOTE — ED Provider Notes (Signed)
 Received signout from evening provider pending the repeat troponin.  Fortunately repeat troponin is flat and stable.  Patient is stable and appropriate for discharge.   Cyrena Mylar, MD 06/07/24 [phone]
